# Patient Record
Sex: Female | Born: 1940 | Race: White | Hispanic: No | Smoking: Former smoker
Health system: Southern US, Community
[De-identification: ages and names within clinical notes are randomized; demographics above are authoritative.]

## PROBLEM LIST (undated history)

## (undated) DIAGNOSIS — R0602 Shortness of breath: Secondary | ICD-10-CM

## (undated) DIAGNOSIS — I8390 Asymptomatic varicose veins of unspecified lower extremity: Secondary | ICD-10-CM

## (undated) DIAGNOSIS — E7800 Pure hypercholesterolemia, unspecified: Secondary | ICD-10-CM

## (undated) DIAGNOSIS — IMO0001 Reserved for inherently not codable concepts without codable children: Secondary | ICD-10-CM

## (undated) DIAGNOSIS — I639 Cerebral infarction, unspecified: Secondary | ICD-10-CM

## (undated) DIAGNOSIS — I209 Angina pectoris, unspecified: Secondary | ICD-10-CM

## (undated) DIAGNOSIS — Z8719 Personal history of other diseases of the digestive system: Secondary | ICD-10-CM

## (undated) DIAGNOSIS — I8000 Phlebitis and thrombophlebitis of superficial vessels of unspecified lower extremity: Secondary | ICD-10-CM

## (undated) DIAGNOSIS — E782 Mixed hyperlipidemia: Secondary | ICD-10-CM

## (undated) DIAGNOSIS — G252 Other specified forms of tremor: Secondary | ICD-10-CM

## (undated) DIAGNOSIS — C50919 Malignant neoplasm of unspecified site of unspecified female breast: Secondary | ICD-10-CM

## (undated) DIAGNOSIS — I10 Essential (primary) hypertension: Secondary | ICD-10-CM

## (undated) DIAGNOSIS — M1990 Unspecified osteoarthritis, unspecified site: Secondary | ICD-10-CM

## (undated) DIAGNOSIS — I5031 Acute diastolic (congestive) heart failure: Secondary | ICD-10-CM

## (undated) DIAGNOSIS — I509 Heart failure, unspecified: Secondary | ICD-10-CM

## (undated) DIAGNOSIS — B029 Zoster without complications: Secondary | ICD-10-CM

## (undated) DIAGNOSIS — G250 Essential tremor: Secondary | ICD-10-CM

## (undated) DIAGNOSIS — T8859XA Other complications of anesthesia, initial encounter: Secondary | ICD-10-CM

## (undated) DIAGNOSIS — E041 Nontoxic single thyroid nodule: Secondary | ICD-10-CM

## (undated) DIAGNOSIS — I38 Endocarditis, valve unspecified: Secondary | ICD-10-CM

## (undated) DIAGNOSIS — I4891 Unspecified atrial fibrillation: Secondary | ICD-10-CM

## (undated) DIAGNOSIS — J441 Chronic obstructive pulmonary disease with (acute) exacerbation: Secondary | ICD-10-CM

## (undated) DIAGNOSIS — Z8489 Family history of other specified conditions: Secondary | ICD-10-CM

## (undated) DIAGNOSIS — I482 Chronic atrial fibrillation, unspecified: Secondary | ICD-10-CM

## (undated) DIAGNOSIS — K219 Gastro-esophageal reflux disease without esophagitis: Secondary | ICD-10-CM

## (undated) DIAGNOSIS — T4145XA Adverse effect of unspecified anesthetic, initial encounter: Secondary | ICD-10-CM

## (undated) DIAGNOSIS — I499 Cardiac arrhythmia, unspecified: Secondary | ICD-10-CM

## (undated) DIAGNOSIS — R011 Cardiac murmur, unspecified: Secondary | ICD-10-CM

## (undated) DIAGNOSIS — Z7901 Long term (current) use of anticoagulants: Secondary | ICD-10-CM

## (undated) DIAGNOSIS — I5032 Chronic diastolic (congestive) heart failure: Secondary | ICD-10-CM

## (undated) DIAGNOSIS — J9601 Acute respiratory failure with hypoxia: Secondary | ICD-10-CM

## (undated) DIAGNOSIS — H9190 Unspecified hearing loss, unspecified ear: Secondary | ICD-10-CM

## (undated) DIAGNOSIS — R079 Chest pain, unspecified: Secondary | ICD-10-CM

## (undated) DIAGNOSIS — J449 Chronic obstructive pulmonary disease, unspecified: Secondary | ICD-10-CM

## (undated) DIAGNOSIS — I119 Hypertensive heart disease without heart failure: Secondary | ICD-10-CM

## (undated) DIAGNOSIS — Z86010 Personal history of colonic polyps: Secondary | ICD-10-CM

## (undated) DIAGNOSIS — N179 Acute kidney failure, unspecified: Secondary | ICD-10-CM

## (undated) DIAGNOSIS — Z9221 Personal history of antineoplastic chemotherapy: Secondary | ICD-10-CM

## (undated) DIAGNOSIS — E890 Postprocedural hypothyroidism: Secondary | ICD-10-CM

## (undated) DIAGNOSIS — I6350 Cerebral infarction due to unspecified occlusion or stenosis of unspecified cerebral artery: Secondary | ICD-10-CM

## (undated) DIAGNOSIS — I781 Nevus, non-neoplastic: Secondary | ICD-10-CM

## (undated) DIAGNOSIS — E785 Hyperlipidemia, unspecified: Secondary | ICD-10-CM

## (undated) DIAGNOSIS — Z8673 Personal history of transient ischemic attack (TIA), and cerebral infarction without residual deficits: Secondary | ICD-10-CM

## (undated) HISTORY — DX: Cerebral infarction, unspecified: I63.9

## (undated) HISTORY — DX: Personal history of transient ischemic attack (TIA), and cerebral infarction without residual deficits: Z86.73

## (undated) HISTORY — PX: BREAST SURGERY: SHX581

## (undated) HISTORY — DX: Acute kidney failure, unspecified: N17.9

## (undated) HISTORY — PX: OTHER SURGICAL HISTORY: SHX169

## (undated) HISTORY — DX: Hypertensive heart disease without heart failure: I11.9

## (undated) HISTORY — DX: Mixed hyperlipidemia: E78.2

## (undated) HISTORY — DX: Pure hypercholesterolemia, unspecified: E78.00

## (undated) HISTORY — DX: Phlebitis and thrombophlebitis of superficial vessels of unspecified lower extremity: I80.00

## (undated) HISTORY — DX: Shortness of breath: R06.02

## (undated) HISTORY — DX: Chronic diastolic (congestive) heart failure: I50.32

## (undated) HISTORY — DX: Postprocedural hypothyroidism: E89.0

## (undated) HISTORY — DX: Acute diastolic (congestive) heart failure: I50.31

## (undated) HISTORY — DX: Malignant neoplasm of unspecified site of unspecified female breast: C50.919

## (undated) HISTORY — DX: Cerebral infarction due to unspecified occlusion or stenosis of unspecified cerebral artery: I63.50

## (undated) HISTORY — DX: Chest pain, unspecified: R07.9

## (undated) HISTORY — DX: Hyperlipidemia, unspecified: E78.5

## (undated) HISTORY — DX: Long term (current) use of anticoagulants: Z79.01

## (undated) HISTORY — DX: Heart failure, unspecified: I50.9

## (undated) HISTORY — PX: TONSILLECTOMY: SUR1361

## (undated) HISTORY — DX: Other specified forms of tremor: G25.2

## (undated) HISTORY — DX: Personal history of colonic polyps: Z86.010

## (undated) HISTORY — DX: Chronic obstructive pulmonary disease with (acute) exacerbation: J44.1

## (undated) HISTORY — DX: Essential tremor: G25.0

## (undated) HISTORY — DX: Chronic atrial fibrillation, unspecified: I48.20

## (undated) HISTORY — DX: Essential (primary) hypertension: I10

## (undated) HISTORY — DX: Acute respiratory failure with hypoxia: J96.01

## (undated) HISTORY — DX: Morbid (severe) obesity due to excess calories: E66.01

---
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
## 1988-11-29 HISTORY — PX: BUNIONECTOMY: SHX129

## 1993-08-29 HISTORY — PX: OTHER SURGICAL HISTORY: SHX169

## 1997-09-15 ENCOUNTER — Ambulatory Visit (HOSPITAL_COMMUNITY): Admission: RE | Admit: 1997-09-15 | Discharge: 1997-09-15 | Payer: Self-pay | Admitting: Family Medicine

## 1998-08-24 ENCOUNTER — Ambulatory Visit (HOSPITAL_COMMUNITY): Admission: RE | Admit: 1998-08-24 | Discharge: 1998-08-24 | Payer: Self-pay | Admitting: *Deleted

## 1998-09-12 ENCOUNTER — Encounter: Payer: Self-pay | Admitting: Family Medicine

## 1998-09-12 ENCOUNTER — Ambulatory Visit (HOSPITAL_COMMUNITY): Admission: RE | Admit: 1998-09-12 | Discharge: 1998-09-12 | Payer: Self-pay

## 1998-09-14 ENCOUNTER — Ambulatory Visit (HOSPITAL_COMMUNITY): Admission: RE | Admit: 1998-09-14 | Discharge: 1998-09-14 | Payer: Self-pay | Admitting: Family Medicine

## 1998-09-24 ENCOUNTER — Encounter: Payer: Self-pay | Admitting: Surgery

## 1998-09-25 ENCOUNTER — Encounter: Payer: Self-pay | Admitting: Surgery

## 1998-09-25 ENCOUNTER — Ambulatory Visit (HOSPITAL_COMMUNITY): Admission: RE | Admit: 1998-09-25 | Discharge: 1998-09-25 | Payer: Self-pay | Admitting: Surgery

## 1998-10-19 ENCOUNTER — Observation Stay (HOSPITAL_COMMUNITY): Admission: RE | Admit: 1998-10-19 | Discharge: 1998-10-20 | Payer: Self-pay | Admitting: Surgery

## 1998-10-19 ENCOUNTER — Encounter (INDEPENDENT_AMBULATORY_CARE_PROVIDER_SITE_OTHER): Payer: Self-pay

## 1998-10-19 HISTORY — PX: MASTECTOMY: SHX3

## 1998-11-06 ENCOUNTER — Ambulatory Visit (HOSPITAL_COMMUNITY): Admission: RE | Admit: 1998-11-06 | Discharge: 1998-11-06 | Payer: Self-pay | Admitting: *Deleted

## 1998-11-06 ENCOUNTER — Encounter: Payer: Self-pay | Admitting: *Deleted

## 1998-11-08 ENCOUNTER — Ambulatory Visit (HOSPITAL_COMMUNITY): Admission: RE | Admit: 1998-11-08 | Discharge: 1998-11-08 | Payer: Self-pay | Admitting: *Deleted

## 1998-11-08 ENCOUNTER — Encounter: Payer: Self-pay | Admitting: *Deleted

## 1998-12-04 ENCOUNTER — Ambulatory Visit (HOSPITAL_BASED_OUTPATIENT_CLINIC_OR_DEPARTMENT_OTHER): Admission: RE | Admit: 1998-12-04 | Discharge: 1998-12-04 | Payer: Self-pay | Admitting: Surgery

## 1998-12-04 ENCOUNTER — Encounter: Payer: Self-pay | Admitting: Surgery

## 1998-12-28 ENCOUNTER — Ambulatory Visit (HOSPITAL_COMMUNITY): Admission: RE | Admit: 1998-12-28 | Discharge: 1998-12-28 | Payer: Self-pay | Admitting: Surgery

## 1999-02-15 ENCOUNTER — Ambulatory Visit (HOSPITAL_COMMUNITY): Admission: RE | Admit: 1999-02-15 | Discharge: 1999-02-15 | Payer: Self-pay | Admitting: Surgery

## 1999-04-01 DIAGNOSIS — C50919 Malignant neoplasm of unspecified site of unspecified female breast: Secondary | ICD-10-CM

## 1999-04-01 HISTORY — DX: Malignant neoplasm of unspecified site of unspecified female breast: C50.919

## 1999-09-25 ENCOUNTER — Ambulatory Visit (HOSPITAL_COMMUNITY): Admission: RE | Admit: 1999-09-25 | Discharge: 1999-09-25 | Payer: Self-pay | Admitting: Surgery

## 1999-09-27 ENCOUNTER — Ambulatory Visit (HOSPITAL_COMMUNITY): Admission: RE | Admit: 1999-09-27 | Discharge: 1999-09-27 | Payer: Self-pay | Admitting: Obstetrics & Gynecology

## 2000-02-19 ENCOUNTER — Encounter: Payer: Self-pay | Admitting: *Deleted

## 2000-02-19 ENCOUNTER — Encounter: Admission: RE | Admit: 2000-02-19 | Discharge: 2000-02-19 | Payer: Self-pay | Admitting: *Deleted

## 2000-05-27 ENCOUNTER — Encounter (INDEPENDENT_AMBULATORY_CARE_PROVIDER_SITE_OTHER): Payer: Self-pay | Admitting: *Deleted

## 2000-05-27 ENCOUNTER — Ambulatory Visit (HOSPITAL_COMMUNITY): Admission: RE | Admit: 2000-05-27 | Discharge: 2000-05-27 | Payer: Self-pay | Admitting: *Deleted

## 2000-09-25 ENCOUNTER — Ambulatory Visit (HOSPITAL_COMMUNITY): Admission: RE | Admit: 2000-09-25 | Discharge: 2000-09-25 | Payer: Self-pay | Admitting: Obstetrics & Gynecology

## 2001-05-29 HISTORY — PX: JOINT REPLACEMENT: SHX530

## 2001-06-03 ENCOUNTER — Encounter: Payer: Self-pay | Admitting: Orthopedic Surgery

## 2001-06-07 ENCOUNTER — Inpatient Hospital Stay (HOSPITAL_COMMUNITY): Admission: RE | Admit: 2001-06-07 | Discharge: 2001-06-10 | Payer: Self-pay | Admitting: Orthopedic Surgery

## 2001-10-08 ENCOUNTER — Ambulatory Visit (HOSPITAL_COMMUNITY): Admission: RE | Admit: 2001-10-08 | Discharge: 2001-10-08 | Payer: Self-pay | Admitting: Obstetrics & Gynecology

## 2001-10-08 ENCOUNTER — Encounter (INDEPENDENT_AMBULATORY_CARE_PROVIDER_SITE_OTHER): Payer: Self-pay | Admitting: Specialist

## 2001-10-08 ENCOUNTER — Other Ambulatory Visit: Admission: RE | Admit: 2001-10-08 | Discharge: 2001-10-08 | Payer: Self-pay | Admitting: Obstetrics & Gynecology

## 2001-11-29 HISTORY — PX: JOINT REPLACEMENT: SHX530

## 2001-12-13 ENCOUNTER — Inpatient Hospital Stay (HOSPITAL_COMMUNITY): Admission: RE | Admit: 2001-12-13 | Discharge: 2001-12-16 | Payer: Self-pay | Admitting: Orthopedic Surgery

## 2002-10-14 ENCOUNTER — Encounter: Payer: Self-pay | Admitting: Oncology

## 2002-10-14 ENCOUNTER — Ambulatory Visit (HOSPITAL_COMMUNITY): Admission: RE | Admit: 2002-10-14 | Discharge: 2002-10-14 | Payer: Self-pay | Admitting: Oncology

## 2002-11-04 ENCOUNTER — Ambulatory Visit (HOSPITAL_COMMUNITY): Admission: RE | Admit: 2002-11-04 | Discharge: 2002-11-04 | Payer: Self-pay | Admitting: Obstetrics & Gynecology

## 2003-09-04 ENCOUNTER — Other Ambulatory Visit: Admission: RE | Admit: 2003-09-04 | Discharge: 2003-09-04 | Payer: Self-pay | Admitting: Obstetrics and Gynecology

## 2003-09-23 DIAGNOSIS — I38 Endocarditis, valve unspecified: Secondary | ICD-10-CM

## 2003-09-23 HISTORY — DX: Endocarditis, valve unspecified: I38

## 2003-10-16 ENCOUNTER — Encounter: Admission: RE | Admit: 2003-10-16 | Discharge: 2003-10-16 | Payer: Self-pay | Admitting: Oncology

## 2004-03-31 DIAGNOSIS — Z860101 Personal history of adenomatous and serrated colon polyps: Secondary | ICD-10-CM

## 2004-03-31 DIAGNOSIS — Z86010 Personal history of colonic polyps: Secondary | ICD-10-CM

## 2004-03-31 HISTORY — DX: Personal history of colonic polyps: Z86.010

## 2004-03-31 HISTORY — DX: Personal history of adenomatous and serrated colon polyps: Z86.0101

## 2004-05-29 ENCOUNTER — Ambulatory Visit: Payer: Self-pay | Admitting: Oncology

## 2004-07-26 ENCOUNTER — Encounter (INDEPENDENT_AMBULATORY_CARE_PROVIDER_SITE_OTHER): Payer: Self-pay | Admitting: Specialist

## 2004-07-26 ENCOUNTER — Ambulatory Visit (HOSPITAL_COMMUNITY): Admission: RE | Admit: 2004-07-26 | Discharge: 2004-07-26 | Payer: Self-pay | Admitting: *Deleted

## 2004-07-29 ENCOUNTER — Emergency Department (HOSPITAL_COMMUNITY): Admission: EM | Admit: 2004-07-29 | Discharge: 2004-07-29 | Payer: Self-pay | Admitting: Emergency Medicine

## 2004-10-16 ENCOUNTER — Encounter: Admission: RE | Admit: 2004-10-16 | Discharge: 2004-10-16 | Payer: Self-pay | Admitting: Oncology

## 2004-10-21 ENCOUNTER — Other Ambulatory Visit: Admission: RE | Admit: 2004-10-21 | Discharge: 2004-10-21 | Payer: Self-pay | Admitting: Obstetrics and Gynecology

## 2004-10-23 ENCOUNTER — Encounter: Admission: RE | Admit: 2004-10-23 | Discharge: 2004-10-23 | Payer: Self-pay | Admitting: Oncology

## 2004-11-26 ENCOUNTER — Ambulatory Visit: Payer: Self-pay | Admitting: Oncology

## 2005-01-16 ENCOUNTER — Ambulatory Visit: Payer: Self-pay | Admitting: Oncology

## 2005-05-28 ENCOUNTER — Ambulatory Visit: Payer: Self-pay | Admitting: Oncology

## 2005-10-20 ENCOUNTER — Encounter: Admission: RE | Admit: 2005-10-20 | Discharge: 2005-10-20 | Payer: Self-pay | Admitting: Oncology

## 2005-12-02 ENCOUNTER — Ambulatory Visit: Payer: Self-pay | Admitting: Oncology

## 2005-12-04 LAB — CBC WITH DIFFERENTIAL/PLATELET
Basophils Absolute: 0 10*3/uL (ref 0.0–0.1)
EOS%: 2.2 % (ref 0.0–7.0)
Eosinophils Absolute: 0.1 10*3/uL (ref 0.0–0.5)
HCT: 35.1 % (ref 34.8–46.6)
HGB: 12 g/dL (ref 11.6–15.9)
MCH: 28.3 pg (ref 26.0–34.0)
MCV: 82.9 fL (ref 81.0–101.0)
NEUT#: 2.8 10*3/uL (ref 1.5–6.5)
NEUT%: 66.1 % (ref 39.6–76.8)
lymph#: 0.9 10*3/uL (ref 0.9–3.3)

## 2005-12-04 LAB — COMPREHENSIVE METABOLIC PANEL
AST: 22 U/L (ref 0–37)
Albumin: 4.2 g/dL (ref 3.5–5.2)
BUN: 15 mg/dL (ref 6–23)
Calcium: 9.6 mg/dL (ref 8.4–10.5)
Chloride: 107 mEq/L (ref 96–112)
Creatinine, Ser: 0.62 mg/dL (ref 0.40–1.20)
Glucose, Bld: 111 mg/dL — ABNORMAL HIGH (ref 70–99)

## 2005-12-04 LAB — CANCER ANTIGEN 27.29: CA 27.29: 10 U/mL (ref 0–39)

## 2005-12-04 LAB — LACTATE DEHYDROGENASE: LDH: 209 U/L (ref 94–250)

## 2005-12-26 ENCOUNTER — Encounter: Admission: RE | Admit: 2005-12-26 | Discharge: 2005-12-26 | Payer: Self-pay | Admitting: Orthopedic Surgery

## 2006-06-10 ENCOUNTER — Ambulatory Visit: Payer: Self-pay | Admitting: Oncology

## 2006-06-15 LAB — COMPREHENSIVE METABOLIC PANEL
ALT: 30 U/L (ref 0–35)
Albumin: 4.4 g/dL (ref 3.5–5.2)
CO2: 25 mEq/L (ref 19–32)
Chloride: 105 mEq/L (ref 96–112)
Glucose, Bld: 111 mg/dL — ABNORMAL HIGH (ref 70–99)
Potassium: 4.1 mEq/L (ref 3.5–5.3)
Sodium: 140 mEq/L (ref 135–145)
Total Bilirubin: 1.7 mg/dL — ABNORMAL HIGH (ref 0.3–1.2)
Total Protein: 7 g/dL (ref 6.0–8.3)

## 2006-06-15 LAB — CBC WITH DIFFERENTIAL/PLATELET
BASO%: 0.4 % (ref 0.0–2.0)
Eosinophils Absolute: 0.1 10*3/uL (ref 0.0–0.5)
MCHC: 35.1 g/dL (ref 32.0–36.0)
MONO#: 0.4 10*3/uL (ref 0.1–0.9)
NEUT#: 3.1 10*3/uL (ref 1.5–6.5)
Platelets: 246 10*3/uL (ref 145–400)
RBC: 4.44 10*6/uL (ref 3.70–5.32)
RDW: 15 % — ABNORMAL HIGH (ref 11.3–14.5)
WBC: 4.5 10*3/uL (ref 3.9–10.0)
lymph#: 0.9 10*3/uL (ref 0.9–3.3)

## 2006-06-15 LAB — LACTATE DEHYDROGENASE: LDH: 201 U/L (ref 94–250)

## 2006-06-15 LAB — CEA: CEA: <0.5 ng/mL (ref 0.0–5.0)

## 2006-10-14 ENCOUNTER — Ambulatory Visit: Payer: Self-pay | Admitting: Oncology

## 2006-10-19 LAB — CANCER ANTIGEN 27.29: CA 27.29: 9 U/mL (ref 0–39)

## 2006-10-19 LAB — CBC WITH DIFFERENTIAL/PLATELET
BASO%: 0.3 % (ref 0.0–2.0)
Basophils Absolute: 0 10*3/uL (ref 0.0–0.1)
EOS%: 1.9 % (ref 0.0–7.0)
Eosinophils Absolute: 0.1 10*3/uL (ref 0.0–0.5)
HCT: 36.7 % (ref 34.8–46.6)
HGB: 12.9 g/dL (ref 11.6–15.9)
LYMPH%: 20.8 % (ref 14.0–48.0)
MCH: 28.8 pg (ref 26.0–34.0)
MCHC: 35.3 g/dL (ref 32.0–36.0)
MCV: 81.6 fL (ref 81.0–101.0)
MONO#: 0.4 10*3/uL (ref 0.1–0.9)
MONO%: 9.3 % (ref 0.0–13.0)
NEUT#: 3.2 10*3/uL (ref 1.5–6.5)
NEUT%: 67.7 % (ref 39.6–76.8)
Platelets: 255 10*3/uL (ref 145–400)
RBC: 4.5 10*6/uL (ref 3.70–5.32)
RDW: 15.3 % — ABNORMAL HIGH (ref 11.3–14.5)
WBC: 4.8 10*3/uL (ref 3.9–10.0)
lymph#: 1 10*3/uL (ref 0.9–3.3)

## 2006-10-19 LAB — COMPREHENSIVE METABOLIC PANEL
ALT: 25 U/L (ref 0–35)
AST: 28 U/L (ref 0–37)
Alkaline Phosphatase: 93 U/L (ref 39–117)
BUN: 19 mg/dL (ref 6–23)
Chloride: 107 mEq/L (ref 96–112)
Creatinine, Ser: 0.67 mg/dL (ref 0.40–1.20)

## 2006-10-19 LAB — LACTATE DEHYDROGENASE: LDH: 213 U/L (ref 94–250)

## 2006-10-22 ENCOUNTER — Encounter: Admission: RE | Admit: 2006-10-22 | Discharge: 2006-10-22 | Payer: Self-pay | Admitting: Oncology

## 2006-10-23 ENCOUNTER — Other Ambulatory Visit: Admission: RE | Admit: 2006-10-23 | Discharge: 2006-10-23 | Payer: Self-pay | Admitting: Obstetrics and Gynecology

## 2007-04-01 HISTORY — PX: KNEE SURGERY: SHX244

## 2007-04-16 ENCOUNTER — Ambulatory Visit: Payer: Self-pay | Admitting: Oncology

## 2007-04-26 LAB — CBC WITH DIFFERENTIAL/PLATELET
Basophils Absolute: 0 10*3/uL (ref 0.0–0.1)
Eosinophils Absolute: 0.1 10*3/uL (ref 0.0–0.5)
HCT: 35.7 % (ref 34.8–46.6)
HGB: 12.3 g/dL (ref 11.6–15.9)
LYMPH%: 21.2 % (ref 14.0–48.0)
MCV: 82.7 fL (ref 81.0–101.0)
MONO#: 0.5 10*3/uL (ref 0.1–0.9)
MONO%: 10.5 % (ref 0.0–13.0)
NEUT#: 3 10*3/uL (ref 1.5–6.5)
NEUT%: 65.2 % (ref 39.6–76.8)
Platelets: 243 10*3/uL (ref 145–400)
WBC: 4.6 10*3/uL (ref 3.9–10.0)

## 2007-04-26 LAB — COMPREHENSIVE METABOLIC PANEL
BUN: 15 mg/dL (ref 6–23)
CO2: 26 mEq/L (ref 19–32)
Creatinine, Ser: 0.66 mg/dL (ref 0.40–1.20)
Glucose, Bld: 100 mg/dL — ABNORMAL HIGH (ref 70–99)
Sodium: 139 mEq/L (ref 135–145)
Total Bilirubin: 1.6 mg/dL — ABNORMAL HIGH (ref 0.3–1.2)
Total Protein: 7 g/dL (ref 6.0–8.3)

## 2007-04-26 LAB — CANCER ANTIGEN 27.29: CA 27.29: 15 U/mL (ref 0–39)

## 2007-04-29 LAB — VITAMIN D PNL(25-HYDRXY+1,25-DIHY)-BLD
Vit D, 1,25-Dihydroxy: 35 pg/mL (ref 6–62)
Vit D, 25-Hydroxy: 48 ng/mL (ref 30–89)

## 2007-10-24 ENCOUNTER — Ambulatory Visit: Payer: Self-pay | Admitting: Oncology

## 2007-11-01 ENCOUNTER — Encounter: Admission: RE | Admit: 2007-11-01 | Discharge: 2007-11-01 | Payer: Self-pay | Admitting: Oncology

## 2007-11-01 LAB — CBC WITH DIFFERENTIAL/PLATELET
EOS%: 2.6 % (ref 0.0–7.0)
MCH: 28.9 pg (ref 26.0–34.0)
MCHC: 34.5 g/dL (ref 32.0–36.0)
MCV: 83.9 fL (ref 81.0–101.0)
MONO%: 9.5 % (ref 0.0–13.0)
NEUT#: 3.3 10*3/uL (ref 1.5–6.5)
RBC: 4.59 10*6/uL (ref 3.70–5.32)
RDW: 15.8 % — ABNORMAL HIGH (ref 11.3–14.5)

## 2007-11-02 LAB — COMPREHENSIVE METABOLIC PANEL
AST: 28 U/L (ref 0–37)
Albumin: 4.6 g/dL (ref 3.5–5.2)
Alkaline Phosphatase: 88 U/L (ref 39–117)
Potassium: 4.4 mEq/L (ref 3.5–5.3)
Sodium: 141 mEq/L (ref 135–145)
Total Protein: 7.4 g/dL (ref 6.0–8.3)

## 2007-11-02 LAB — VITAMIN D 25 HYDROXY (VIT D DEFICIENCY, FRACTURES): Vit D, 25-Hydroxy: 48 ng/mL (ref 30–89)

## 2007-11-10 ENCOUNTER — Encounter: Admission: RE | Admit: 2007-11-10 | Discharge: 2007-11-10 | Payer: Self-pay | Admitting: Oncology

## 2008-01-30 HISTORY — PX: JOINT REPLACEMENT: SHX530

## 2008-02-09 ENCOUNTER — Inpatient Hospital Stay (HOSPITAL_COMMUNITY): Admission: RE | Admit: 2008-02-09 | Discharge: 2008-02-13 | Payer: Self-pay | Admitting: Orthopedic Surgery

## 2008-11-01 ENCOUNTER — Encounter: Admission: RE | Admit: 2008-11-01 | Discharge: 2008-11-01 | Payer: Self-pay | Admitting: Oncology

## 2008-11-13 ENCOUNTER — Ambulatory Visit: Payer: Self-pay | Admitting: Oncology

## 2008-11-15 LAB — CBC WITH DIFFERENTIAL/PLATELET
Eosinophils Absolute: 0.1 10*3/uL (ref 0.0–0.5)
HCT: 32.7 % — ABNORMAL LOW (ref 34.8–46.6)
HGB: 11.2 g/dL — ABNORMAL LOW (ref 11.6–15.9)
LYMPH%: 19.7 % (ref 14.0–49.7)
MCH: 29.4 pg (ref 25.1–34.0)
MONO#: 0.3 10*3/uL (ref 0.1–0.9)
NEUT#: 2 10*3/uL (ref 1.5–6.5)
Platelets: 196 10*3/uL (ref 145–400)
RBC: 3.82 10*6/uL (ref 3.70–5.45)
WBC: 2.9 10*3/uL — ABNORMAL LOW (ref 3.9–10.3)

## 2008-11-16 LAB — COMPREHENSIVE METABOLIC PANEL
Albumin: 4.2 g/dL (ref 3.5–5.2)
CO2: 23 mEq/L (ref 19–32)
Glucose, Bld: 109 mg/dL — ABNORMAL HIGH (ref 70–99)
Sodium: 141 mEq/L (ref 135–145)
Total Bilirubin: 0.9 mg/dL (ref 0.3–1.2)
Total Protein: 6.5 g/dL (ref 6.0–8.3)

## 2008-11-16 LAB — LACTATE DEHYDROGENASE: LDH: 210 U/L (ref 94–250)

## 2008-11-16 LAB — CANCER ANTIGEN 27.29: CA 27.29: 16 U/mL (ref 0–39)

## 2009-06-30 ENCOUNTER — Emergency Department (HOSPITAL_COMMUNITY): Admission: EM | Admit: 2009-06-30 | Discharge: 2009-06-30 | Payer: Self-pay | Admitting: Emergency Medicine

## 2009-11-02 ENCOUNTER — Encounter: Admission: RE | Admit: 2009-11-02 | Discharge: 2009-11-02 | Payer: Self-pay | Admitting: Oncology

## 2009-11-12 ENCOUNTER — Ambulatory Visit: Payer: Self-pay | Admitting: Oncology

## 2009-11-14 LAB — CBC WITH DIFFERENTIAL/PLATELET
BASO%: 0.4 % (ref 0.0–2.0)
Basophils Absolute: 0 10*3/uL (ref 0.0–0.1)
EOS%: 2.1 % (ref 0.0–7.0)
Eosinophils Absolute: 0.1 10*3/uL (ref 0.0–0.5)
HCT: 35.6 % (ref 34.8–46.6)
HGB: 12.3 g/dL (ref 11.6–15.9)
LYMPH%: 21.5 % (ref 14.0–49.7)
MCV: 86.6 fL (ref 79.5–101.0)
MONO#: 0.3 10*3/uL (ref 0.1–0.9)
NEUT#: 2.6 10*3/uL (ref 1.5–6.5)
NEUT%: 67.1 % (ref 38.4–76.8)
RDW: 14.3 % (ref 11.2–14.5)
WBC: 3.9 10*3/uL (ref 3.9–10.3)
lymph#: 0.8 10*3/uL — ABNORMAL LOW (ref 0.9–3.3)

## 2009-11-15 LAB — COMPREHENSIVE METABOLIC PANEL
Alkaline Phosphatase: 40 U/L (ref 39–117)
BUN: 22 mg/dL (ref 6–23)
CO2: 26 mEq/L (ref 19–32)
Calcium: 9.8 mg/dL (ref 8.4–10.5)
Chloride: 104 mEq/L (ref 96–112)
Potassium: 4.4 mEq/L (ref 3.5–5.3)
Sodium: 140 mEq/L (ref 135–145)
Total Bilirubin: 0.9 mg/dL (ref 0.3–1.2)

## 2010-04-20 ENCOUNTER — Other Ambulatory Visit: Payer: Self-pay | Admitting: Oncology

## 2010-04-20 DIAGNOSIS — Z9012 Acquired absence of left breast and nipple: Secondary | ICD-10-CM

## 2010-06-19 LAB — POCT I-STAT, CHEM 8
BUN: 20 mg/dL (ref 6–23)
Calcium, Ion: 1.32 mmol/L (ref 1.12–1.32)
Chloride: 104 mEq/L (ref 96–112)
HCT: 39 % (ref 36.0–46.0)
Hemoglobin: 13.3 g/dL (ref 12.0–15.0)
Potassium: 4.4 mEq/L (ref 3.5–5.1)
Sodium: 141 mEq/L (ref 135–145)

## 2010-06-19 LAB — CK TOTAL AND CKMB (NOT AT ARMC)
CK, MB: 2.2 ng/mL (ref 0.3–4.0)
Total CK: 103 U/L (ref 7–177)

## 2010-06-19 LAB — POCT CARDIAC MARKERS: Troponin i, poc: <0.05 ng/mL (ref 0.00–0.09)

## 2010-06-19 LAB — DIFFERENTIAL
Basophils Absolute: 0 10*3/uL (ref 0.0–0.1)
Eosinophils Relative: 2 % (ref 0–5)
Lymphocytes Relative: 16 % (ref 12–46)
Lymphs Abs: 0.8 10*3/uL (ref 0.7–4.0)
Monocytes Absolute: 0.5 10*3/uL (ref 0.1–1.0)
Monocytes Relative: 10 % (ref 3–12)
Neutrophils Relative %: 71 % (ref 43–77)

## 2010-06-19 LAB — CBC
Hemoglobin: 12.8 g/dL (ref 12.0–15.0)
MCHC: 34.7 g/dL (ref 30.0–36.0)
Platelets: 241 10*3/uL (ref 150–400)

## 2010-07-30 HISTORY — PX: CARDIAC CATHETERIZATION: SHX172

## 2010-08-13 NOTE — H&P (Signed)
Kristin Hood, TEP              ACCOUNT NO.:  [id number]   MEDICAL RECORD NO.:  [id number]          PATIENT TYPE:  INP   LOCATION:  NA                           FACILITY:  [facility]   PHYSICIAN:  Ollen Gross, M.D.    DATE OF BIRTH:  Aug 19, 1940   DATE OF ADMISSION:  DATE OF DISCHARGE:                              HISTORY & PHYSICAL   DATE OF OFFICE VISIT:  History and physical was performed on January 11, 2008.   DATE OF ADMISSION:  Will be February 09, 2008.   CHIEF COMPLAINT:  Loose right total knee.   HISTORY OF PRESENT ILLNESS:  The patient is a 70 year old female who has  been seen by Dr. Lequita Halt for ongoing knee problems as second opinion.  She had a right total knee done back about 6 or 7 years ago.  She did  well until a fall about 2 to 2-1/2 years ago.  She started having some  pretibial pain ongoing.  She is patient of Dr. Jonetta Osgood and is good  friends with his PA, Kirstin.  [name] has been in contact with  Kirstin recently about worsening pain.  She followed up with notable x-  rays positive for loosening right tibial component.  She is referred  over for second opinion for possible revision surgery.  She is seen and  it is felt she would benefit from undergoing revision total knee  arthroplasty.  Risks and benefits have been discussed.  She would like  to proceed with surgery.   ALLERGIES:  NO KNOWN DRUG ALLERGIES.   CURRENT MEDICATIONS:  Crestor, __________ , propranolol, lisinopril,  Lovaza, aspirin, multivitamin, calcium plus D with zinc and iron.   PAST MEDICAL HISTORY:  1. Cataracts.  2. Hypertension.  3. 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  4. Breast cancer.   PAST SURGICAL HISTORY:  1. Bunionectomy bilaterally.  2. Removal of a tibial sesamoid.  3. Left bunion surgery.  4. Lumpectomy with node resection.  5. Mastectomy with additional underlying lymph node resection.  6. Port-A-Cath placement.  7. Right total knee replacement.  8.  Left total knee replacement.   FAMILY HISTORY:  Father with history of MI.  Mother with history of  aortic stenosis, pneumonia.   SOCIAL HISTORY:  Married, past smoker, quit 1993.  Occasional glass of  wine.  Two children.  Husband will be assisting with care after surgery.  She does have a one-story home.  She also has a living will and health  care power-of-attorney.   REVIEW OF SYSTEMS:  GENERAL:  No fevers, chills, occasional night  sweats.  NEURO:  No seizures, syncope, paralysis.  RESPIRATORY:  No  shortness breath, productive cough or hemoptysis.  CARDIOVASCULAR: No  chest pain or orthopnea.  She does have a cardiac murmur due to the  aortic valvular sclerosis.  GI:  No nausea, vomiting, diarrhea or  constipation.  GU:  No dysuria, hematuria.  MUSCULOSKELETAL:  Right  knee.   PHYSICAL EXAMINATION:  VITAL SIGNS: Pulse 72, respirations 14, blood  pressure 120/72.  GENERAL:  70 year old white female well-nourished, well-developed,  overweight.  Alert, oriented and cooperative, pleasant.  Excellent  historian.  HEENT: Normocephalic, atraumatic.  Pupils are round and reactive.  EOMs  intact.  Oropharynx clear.  NECK:  Is supple.  CHEST: Clear.  She has a left mastectomy.  HEART:  Faint early systolic ejection murmur, S1, S2 noted.  ABDOMEN:  Large, soft, protuberant.  Active bowel sounds present.  RECTAL/ BREASTS/ GENITALIA?  Not done.  LOWER EXTREMITIES:  Right knee tender proximal tibia, no effusion.  Range of motion 5-115.  No instability.   IMPRESSION:  Loose right total knee arthroplasty.   PLAN:  She will be admitted to the [facility], undergo  revision of the right total knee arthroplasty.  Surgery will be  performed by Ollen Gross.      Alexzandrew L. Perkins, P.A.C.      Ollen Gross, M.D.  Electronically Signed    ALP/MEDQ  D:  02/01/2008  T:  02/01/2008  Job:  [id number]   cc:   Ollen Gross, M.D.  Fax: [phone]   Anna Genre Little, M.D.   Fax: [phone]   Francisca December, M.D.  Fax: [phone]   Pierce Crane, MD  Fax: [phone]

## 2010-08-13 NOTE — Op Note (Signed)
Kristin Hood, Kristin Hood              ACCOUNT NO.:  [id number]   MEDICAL RECORD NO.:  [id number]          PATIENT TYPE:  INP   LOCATION:  1620                         FACILITY:  [facility]   PHYSICIAN:  Ollen Gross, M.D.    DATE OF BIRTH:  [birth year]   DATE OF PROCEDURE:  02/09/2008  DATE OF DISCHARGE:                               OPERATIVE REPORT   PREOPERATIVE DIAGNOSIS:  Loose right total knee arthroplasty.   POSTOPERATIVE DIAGNOSIS:  Loose right total knee arthroplasty.   PROCEDURE:  Right total knee arthroplasty revision.   SURGEON:  Ollen Gross, M.D.   ASSISTANT:  Alexzandrew L. Perkins, P.A.C.   ANESTHESIA:  General with postop Marcaine pain pump.   ESTIMATED BLOOD LOSS:  Minimal.   DRAIN:  Hemovac times one.   TOURNIQUET TIME:  Up 48 minutes at 300 mmHg, down 8 minutes and then up  an additional 27 minutes at 300 mmHg.   COMPLICATIONS:  None.   CONDITION:  Stable to recovery room.   BRIEF CLINICAL NOTE:  Kristin Hood is a 70 year old female who had a  right total knee arthroplasty done several years ago.  She did well and  then had a fall and had progressively worsening pretibial pain since.  Her radiographs reveal progressive loosening and varus tilt of the  tibial compliment.  She presents now for revision of the total knee.   PROCEDURE IN DETAIL:  After the successful administration of general  anesthetic the tourniquet was placed high on the right thigh and right  lower extremity prepped and draped in the usual sterile fashion.  Extremity was wrapped in Esmarch, knee flexed, tourniquet inflated to  300 mmHg.  Midline incision made with a 10 blade through the  subcutaneous tissue to the level of the extensor mechanism.  A fresh  blade is used to make a medial parapatellar arthrotomy.  There is  minimal synovial fluid encountered.  This was sent for a stat Gram stain  which was negative.  Soft tissue over the proximal medial tibia is  subperiosteally elevated  to the joint line with the knife and into the  semimembranosus bursa with a Cobb elevator.  Soft tissue laterally is  elevated with attention being paid to avoid the patellar tendon on  tibial tubercle.  Patella was subluxed laterally and knee flexed 90  degrees.  I removed the tibial polyethylene from the tibial tray easily  with a quarter inch osteotome.  The femoral component is then removed by  disrupting interface between the femur and the component using  osteotomes.  The components removed with minimal if any bone loss.  I  then subluxed the tibia forward and basically was able to pick out the  tibial tray with my fingers.  It had collapsed into varus and was  grossly loose.  I then removed the cement from the keel and the cement  from the canal.  I then thoroughly irrigated the femoral and tibial  canals and reamed the femoral canal up to 18 mm which had a good tight  fit and on the tibial side I went to 13 mm.  The tibia was again subluxed forward and retractors placed.  The  extramedullary cutting guide is placed and referencing proximally to the  medial aspect of the tibial tubercle and distally along the second  metatarsal axis and tibial crest.  I cut the tibia so it would go to the  bottom of that medial defect.  I went down this level because I knew I  would be building up again with augments.  Resection is made with an  oscillating saw.  Size 2.5 is the most appropriate tibial component.  We  prepared the proximal tibia with the proximal reamer with a 2.5.  I then  did the punch for the 29 mm sleeve to gain rotational control.  We had  excellent fit with the component and sleeve.  There was a 13 x 30 stem  extension which was placed on the tibial MBT tray.   The femur was then prepared.  We thoroughly irrigated the canal again  and placed the 18 mm reamer.  A 5 degree right valgus alignment guide is  placed and for the zero position I did not contact any bone so I went  up  to the +4 position and thus we put 4 mm distal medial and lateral  augments.  The size is a 3 based on the femoral trials.  The size 3  cutting block was placed in a +2 position to effectively raise the stem  and lower the component to the anterior flange of the femur.  I then  placed a 15 mm spacer block in 90 degrees of flexion and this led to a  rectangular flexion gap at 90 degrees.  The rotation is marked and then  anterior, posterior and chamfer cuts are made.   The revision block is placed for the intercondylar cut.  Once this is  completed then a trial was assembled.  On the tibial side we trialed a  size 2.5 MBT revision tray with a 10 mm medial and lateral augmented 29  mm sleeve and 13 x 30 stem extension.  We had excellent fit with this.  On the femoral side our trial was a size 3 TC3 femur with an 18 x 75  stem extension in the +2 position, 4 mm medial and lateral distal  augments as well as a 4 mm posterior medial augment.  We had excellent  fit on the femur with this trial.  We then put the 15 mm insert in.  With the 15 she really hyper extends quite a bit and had fair tension in  flexion.  I decided we needed to build the distal femur down further so  I went to 8 mm distal augments medial and lateral.  We then tried with a  17.5 insert and there was great stability with full extension to varus  and valgus testing.  Then at 90 degrees of flexion there was great  stability, anterior posterior testing so I was very pleased with this  construct.  I then inspected the patella.  The patella button did not  show any significant wear but there was bone overgrowing.  I had to do a  patelloplasty clearing the bone and soft tissue off of the patellar  component.  The patella then tracked perfectly.  We then let the  tourniquet down for a total time of 48 minutes.  I kept it down for 8  minutes total.  This was to ensure that there was minimal bleeding.  After it was  down for 8  minutes then we assembled the components on the  back table.  The components once again were a size 2.5 MBT revision  tray, 10 mm medial and lateral augments with a 13 x 30 stem extension  and a 29 sleeve.  On the femoral side it was a size 3 TC3 femur in the  +2 position, 18 x 75 stem extension with 8 mm distal medial and lateral  augments and a 4 mm posterior medial augment.  I then sized for the  cement restrictor in the tibia and it was a 4.  It was placed at the  appropriate depth in the tibial canal.  The bone surfaces were then  thoroughly irrigated with pulsatile lavage.  The cement was mixed and  once ready for implantation it was injected into the tibial canal.  The  tibial component was impacted with great fit.  On the femoral side it  was cemented distally with a Press-Fit stem.  We did the cementing,  impacted the femur and then removed all the extruded cement.  A trial  17.5 insert was placed, knee held in full extension and all extruded  cement removed.  When the cement was fully hardened then a permanent  17.5 mm TC3 rotating platform tibial polyethylene was placed.  Once  again there is excellent stability with varus, valgus and anterior  posterior stressing.  The wound is copiously irrigated with saline  solution and the arthrotomy closed over a Hemovac drain with interrupted  #1 PDS.  Flexion against gravity was over 95 degrees.  Subcu was closed with interrupted 2-0 Vicryl and skin closed with  staples.  A bulky sterile dressing was applied.  Drains hooked to  suction and she was placed into a knee immobilizer, awakened and  transported to recovery in stable condition.      Ollen Gross, M.D.  Electronically Signed     FA/MEDQ  D:  02/09/2008  T:  02/09/2008  Job:  [id number]

## 2010-08-16 NOTE — Discharge Summary (Signed)
[name], [name]              ACCOUNT NO.:  [id number]   MEDICAL RECORD NO.:  [id number]          PATIENT TYPE:  INP   LOCATION:  1620                         FACILITY:  [facility]   PHYSICIAN:  Ollen Gross, M.D.    DATE OF BIRTH:  05-16-1940   DATE OF ADMISSION:  02/09/2008  DATE OF DISCHARGE:  02/13/2008                               DISCHARGE SUMMARY   ADMISSION DIAGNOSES:  1. Loose right total knee arthroplasty.  2. Cataracts.  3. Hypertension.  4. 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  5. Breast cancer.   DISCHARGE DIAGNOSES:  1. Loose right total knee arthroplasty status post right total knee      arthroplasty revision.  2. Acute postoperative blood loss anemia.  3. Status post transfusion without sequelae.  4. Mild postoperative hyponatremia, improved.  5. Cataracts.  6. Hypertension.  7. 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  8. Breast cancer.   PROCEDURE:  February 09, 2008:  Right total knee arthroplasty revision.   SURGEON:  Ollen Gross, M.D.   ASSISTANT:  Alexzandrew L. Perkins, P.A.C.   ANESTHESIA:  General.   CONSULTS:  None.   BRIEF HISTORY:  Kristin Hood is a 70 year old female with a right total  knee done several years ago.  Did well until a fall when she started  having progressive worsening pretibial pain.  Radiograph showed some  progressive loosening with some varus tilt of the tibial component, now  presented for a revision procedure.   LABORATORY DATA:  Blood group type A+.  CBC on admission:  Hemoglobin  13.0, hematocrit 38.6, white cell count 5.1, platelets 263.  Chem panel  on admission, mildly elevated SGOT of 40.  Remaining chem panel all  within normal limits.  PT/INR 13.1 and 1.0 with PTT of 41.  Preop UA,  moderate leukocyte esterase, otherwise negative.  Serial CBCs were  followed.  Hemoglobin did drop down to 9.9 then got as low as 8.1.  She  was given 2 units of blood. Post-transfusion hemoglobin back up to 8.5.  Serial BMETs were followed.  Sodium did drop down to 133, got as low as  131, came back up to 137.  Serial pro-times followed.  Last noted INR  was 2.7.   DIAGNOSTICS:  1. EKG January 13, 2008, normal sinus rhythm, normal EKG unconfirmed.  2. Chest x-ray two-view February 07, 2008, no acute cardiopulmonary      process.   HOSPITAL COURSE:  The patient admitted to [facility] and  tolerated the procedure well, later transferred to the recovery room on  the orthopedic floor.  Started on PCA and p.r.n. analgesia for pain  control following surgery.  Given 24 hours of postop IV antibiotics.  A  fair amount of pain on the evening of surgery, doing a little better on  the morning of day 1, started getting up out of bed.  Hemoglobin is down  to 9.9.  Started on iron.  Sodium was a little low.  Decreased fluids.  Creatinine bumped up a little bit from 47 to 1.4.  Was on ACE inhibitor,  that was held.  Blood pressure was a little low, so we gave fluids.  Started getting up out of bed on day 1.  By day 2, she was walking about  30-50 feet.  Unfortunately, the hemoglobin was down to 8.1.  Gave her 2  units of blood, tolerated the blood well.  Dressing was changed and  incision looked good.  Her output was excellent.  Sodium was down a  little bit from the dilutional component to 131.  Got her blood  rechecked in the lab.  Sodium did come back to 137.  Hemoglobin improved  a little bit up to 8.5, blood pressure had stabilized.  She was  hemodynamically stable.  Continued to progress with slow progress with  therapy.  Eventually, got up to walking greater than 70 feet.  By  February 13, 2008, the patient was doing well with her therapy,  tolerating her meds and wanted to go home.   DISPOSITION:  The patient was discharged home on February 13, 2008.   DISCHARGE MEDICATIONS:  1. Percocet.  2. Robaxin.  3. Nu-Iron.  4. Coumadin.   DIET:  Low-sodium, heart-healthy diet.   ACTIVITY:   Weightbearing as tolerated, total knee protocol.  Home health  PT and home health nursing.   FOLLOW UP:  Follow up in 2 weeks.   CONDITION ON DISCHARGE:  Improved.      Alexzandrew L. Perkins, P.A.C.      Ollen Gross, M.D.  Electronically Signed    ALP/MEDQ  D:  02/18/2008  T:  02/18/2008  Job:  [id number]   cc:   Ollen Gross, M.D.  Fax: [phone]   Anna Genre Little, M.D.  Fax: [phone]   Francisca December, M.D.  Fax: [phone]   Pierce Crane, MD  Fax: [phone]

## 2010-08-21 ENCOUNTER — Other Ambulatory Visit: Payer: Self-pay | Admitting: Dermatology

## 2010-11-04 ENCOUNTER — Ambulatory Visit
Admission: RE | Admit: 2010-11-04 | Discharge: 2010-11-04 | Disposition: A | Discharge status: Home / Self Care | Payer: Medicare Other | Source: Ambulatory Visit | Attending: Oncology | Admitting: Oncology

## 2010-11-04 ENCOUNTER — Other Ambulatory Visit: Payer: Self-pay | Admitting: Family Medicine

## 2010-11-04 DIAGNOSIS — Z9012 Acquired absence of left breast and nipple: Secondary | ICD-10-CM

## 2011-01-01 LAB — BASIC METABOLIC PANEL
BUN: 15
CO2: 24
CO2: 24
CO2: 29
Chloride: 102
Chloride: 103
Creatinine, Ser: 1.27 — ABNORMAL HIGH
GFR calc Af Amer: 43 — ABNORMAL LOW
GFR calc Af Amer: 51 — ABNORMAL LOW
Glucose, Bld: 109 — ABNORMAL HIGH
Glucose, Bld: 141 — ABNORMAL HIGH
Potassium: 4.3
Potassium: 4.4
Potassium: 4.4
Sodium: 133 — ABNORMAL LOW
Sodium: 137

## 2011-01-01 LAB — CBC
HCT: 22.9 — ABNORMAL LOW
HCT: 24.7 — ABNORMAL LOW
HCT: 28.7 — ABNORMAL LOW
HCT: 38.6
Hemoglobin: 13
Hemoglobin: 8.5 — ABNORMAL LOW
Hemoglobin: 9.9 — ABNORMAL LOW
MCHC: 34.4
MCHC: 34.6
MCHC: 34.8
MCV: 83.6
MCV: 84.8
Platelets: 149 — ABNORMAL LOW
RBC: 2.71 — ABNORMAL LOW
RBC: 3.35 — ABNORMAL LOW
RBC: 4.48
RDW: 15.3
RDW: 15.6 — ABNORMAL HIGH
RDW: 17.4 — ABNORMAL HIGH
WBC: 5

## 2011-01-01 LAB — URINALYSIS, ROUTINE W REFLEX MICROSCOPIC
Bilirubin Urine: NEGATIVE
Hgb urine dipstick: NEGATIVE
Nitrite: NEGATIVE
Protein, ur: 30 — AB
Specific Gravity, Urine: 1.017
Urobilinogen, UA: 1

## 2011-01-01 LAB — TYPE AND SCREEN

## 2011-01-01 LAB — URINE MICROSCOPIC-ADD ON

## 2011-01-01 LAB — PROTIME-INR
INR: 1
INR: 1.2
Prothrombin Time: 13.1
Prothrombin Time: 22.1 — ABNORMAL HIGH
Prothrombin Time: 30.5 — ABNORMAL HIGH

## 2011-01-01 LAB — GRAM STAIN

## 2011-01-01 LAB — COMPREHENSIVE METABOLIC PANEL
Alkaline Phosphatase: 66
BUN: 17
CO2: 28
Chloride: 107
GFR calc non Af Amer: >60
Glucose, Bld: 95
Potassium: 4.8
Total Bilirubin: 1.2
Total Protein: 7.1

## 2011-01-01 LAB — ANAEROBIC CULTURE

## 2011-04-01 DIAGNOSIS — I639 Cerebral infarction, unspecified: Secondary | ICD-10-CM

## 2011-04-01 HISTORY — DX: Cerebral infarction, unspecified: I63.9

## 2011-07-30 HISTORY — PX: CARDIAC CATHETERIZATION: SHX172

## 2011-08-11 ENCOUNTER — Encounter (HOSPITAL_COMMUNITY): Payer: Self-pay | Admitting: Respiratory Therapy

## 2011-08-11 ENCOUNTER — Other Ambulatory Visit: Payer: Self-pay | Admitting: Cardiology

## 2011-08-13 NOTE — H&P (Signed)
  Patient: Kristin Hood, Onell D Provider: Pesach Frisch, MD  DOB: 07/12/1940 Age: 71 Y Sex: Female Date: 08/11/2011  Phone: [phone]   Address: [address], [place], [id number]  Pcp: KEVIN LITTLE       Subjective:     CC:    1. STRESS TEST FOLLOW UP.        HPI:  General:  71-year-old with hypertension, hyperlipidemia, former Dr. Edmunds patient's with last echocardiogram on 01/27/08 demonstrating normal EF, aortic valve sclerosis, posterior mitral valve leaflet thickened and restricted, mild mitral regurgitation, mild left atrial enlargement.  No chest pain, but walking can cause dyspnea, moderate in severity, relieved with rest, no radiation of symptoms. Brought on with exertion. No associated angina. She had stress test which showed inferior ischemia, transient ischemic dilatation with normal ejection fraction. Does water exercises. Had 3 knee replacements. 25 min of stair steppers. She only went 2 minutes on the prior exercise treadmill test before significant shortness of breath ensued. Has essential tremor. Been building since she was 15.  .        ROS:  The other elements of the review of systems are negative.       Medical History: history of breast cancer 2001 followed by oncologist Dr. Rubin, Essential hypertension with mild concentric left ventricular hypertrophy, benign colon polyps on colonoscopy in 2006 with Dr.Santogade, history of adenomatous colon polyps, Mixed dyslipidemia, Essential tremor.        Family History:        Social History:  General:  History of smoking  cigarettes: Former smoker Quit in year 1993 Pack-year Hx: 30 no Smoking.  Alcohol: yes, occasionally-wine.  no Recreational drug use.  Marital Status: married.        Medications: Fish Oil Capsule 2 capsules BID, Aspirin 81 MG Tablet Chewable 1 tablet Once a day, Multivitamins Tablet 1 tablet daily, Iron 66 MG Tablet 1 tablet daily, Calcium + D Tablet 2 tablets Once a day,  Ketoconazole 2 % Cream 1 application to affected area Once a day, Amoxicillin 500 MG Capsule 1 capsule per dentist, Fenofibrate 160 MG Tablet TAKE 1 TABLET BY MOUTH DAILY , Crestor 10 MG Tablet TAKE 1 TABLET EVERY EVENING , Propranolol HCl 60 MG Tablet 2 tablets in the AM and 1 tablet in the PM daily, Lisinopril 5 MG Tablet 1 tablet Once a day, Medication List reviewed and reconciled with the patient       Allergies: Floxin Otic: nausea:dizziness.       Objective:     Vitals: Wt 271.2, Wt change .8 lb, Ht 68.5, BMI 40.63, Pulse sitting 76, BP sitting 138/92.       Examination:  General Examination:  GENERAL APPEARANCE alert, oriented, NAD, pleasant.  SKIN: normal, no rash.  HEENT: normal.  HEAD: [place]/AT.  EYES: EOMI, Conjunctiva clear.  NECK: supple, FROM, without evidence of thyromegaly, adenopathy, or bruits, no jugular venous distention (JVD).  LUNGS: clear to auscultation bilaterally, no wheezes, rhonchi, rales, regular breathing rate and effort.  HEART: regular rate and rhythm, no S3, S4, soft, difficult to hear murmur left lower sternal border systolic murmur, point of maximul impulse (PMI) difficult to palpate.  ABDOMEN: soft, non-tender/non-distended, bowel sounds present, no masses palpated, no bruit, Overweight.  EXTREMITIES: no clubbing, no edema, difficult to palpate distal pulses.  NEUROLOGIC EXAM: non-focal exam, alert and oriented x 3, essential tremor noted, waiver in voice as well..  PERIPHERAL PULSES: normal (2+) bilaterally.  LYMPH NODES: no cervical adenopathy.    PSYCH affect normal.  EKG reviewed, prior lab work reviewed, prior medical records reviewed, prior echocardiogram reviewed.       Assessment:     Assessment:  1. Abnormal cardiovascular stress test - 794.39 (Primary)  2. Mixed hyperlipidemia - 272.2  3. Essential hypertension, benign - 401.1  4. Tremor, essential - 781.0    Plan:     1. Abnormal cardiovascular stress test  LAB: PT (Prothrombin  Time) (005199) (Ordered for 08/11/2011)    Prothrombin Time 11.1 9.1-12.0 - SEC    INR 1.0 0.8-1.2 -     Jaelan Rasheed 08/13/2011 05:13:33 PM > ok for cath    LAB: Basic Metabolic (Ordered for 08/11/2011)    GLUCOSE 104  70-99 - mg/dL H   BUN 21  6-26 - mg/dL    CREATININE 0.88  0.60-1.30 - mg/dl    eGFR (NON-AFRICAN AMERICAN) 63 >60 - calc    eGFR (AFRICAN AMERICAN) 76 >60 - calc    SODIUM 139  136-145 - mmol/L    POTASSIUM 4.8  3.5-5.5 - mmol/L    CHLORIDE 106  98-107 - mmol/L    C02 29  22-32 - mg/dL    ANION GAP 8.4 6.0-20.0 - mmol/L    CALCIUM 10.2  8.6-10.3 - mg/dL     Espiridion Supinski 08/13/2011 05:13:33 PM > ok for cath    LAB: CBC with Diff (Ordered for 08/11/2011)    WBC 3.5 4.0-11.0 - K/ul L   RBC 4.30 4.20-5.40 - M/uL    HGB 12.4 12.0-16.0 - g/dL    HCT 37.7 37.0-47.0 - %    MCH 28.8 27.0-33.0 - pg    MPV 9.8 7.5-10.7 - fL    MCV 87.7 81.0-99.0 - fL    MCHC 32.8 32.0-36.0 - g/dL    RDW 14.4 11.5-15.5 - %    NRBC# 0.01 -    PLT 224 150-400 - K/uL    NEUT % 63.8 43.3-71.9 - %    NRBC% 0.30 - %    LYMPH% 20.2 16.8-43.5 - %    MONO % 11.9 4.6-12.4 - %    EOS % 3.5 0.0-7.8 - %    BASO % 0.6 0.0-1.0 - %    NEUT # 2.2 1.9-7.2 - K/uL    LYMPH# 0.70 1.10-2.70 - K/uL L   MONO # 0.4 0.3-0.8 - K/uL    EOS # 0.1 0.0-0.6 - K/uL    BASO # 0.0 0.0-0.1 - K/uL     Crystalynn Mcinerney 08/13/2011 05:13:33 PM > ok for cath    LAB: PT (Prothrombin Time) (005199) (Ordered for 08/11/2011)    Prothrombin Time 11.1 9.1-12.0 - SEC    INR 1.0 0.8-1.2 -     Carnell Casamento 08/13/2011 05:13:33 PM > ok for cath    LAB: CBC with Diff (Ordered for 08/11/2011)    WBC 3.5 4.0-11.0 - K/ul L   RBC 4.30 4.20-5.40 - M/uL    HGB 12.4 12.0-16.0 - g/dL    HCT 37.7 37.0-47.0 - %    MCH 28.8 27.0-33.0 - pg    MPV 9.8 7.5-10.7 - fL    MCV 87.7 81.0-99.0 - fL    MCHC 32.8 32.0-36.0 - g/dL    RDW 14.4 11.5-15.5 - %    NRBC# 0.01 -    PLT 224 150-400 - K/uL    NEUT % 63.8 43.3-71.9 - %    NRBC% 0.30 - %     LYMPH% 20.2 16.8-43.5 - %    MONO % 11.9 4.6-12.4 - %      EOS % 3.5 0.0-7.8 - %    BASO % 0.6 0.0-1.0 - %    NEUT # 2.2 1.9-7.2 - K/uL    LYMPH# 0.70 1.10-2.70 - K/uL L   MONO # 0.4 0.3-0.8 - K/uL    EOS # 0.1 0.0-0.6 - K/uL    BASO # 0.0 0.0-0.1 - K/uL     Geramy Lamorte 08/13/2011 05:13:33 PM > ok for cath    LAB: Basic Metabolic (Ordered for 08/11/2011)    GLUCOSE 104  70-99 - mg/dL H   BUN 21  6-26 - mg/dL    CREATININE 0.88  0.60-1.30 - mg/dl    eGFR (NON-AFRICAN AMERICAN) 63 >60 - calc    eGFR (AFRICAN AMERICAN) 76 >60 - calc    SODIUM 139  136-145 - mmol/L    POTASSIUM 4.8  3.5-5.5 - mmol/L    CHLORIDE 106  98-107 - mmol/L    C02 29  22-32 - mg/dL    ANION GAP 8.4 6.0-20.0 - mmol/L    CALCIUM 10.2  8.6-10.3 - mg/dL     Keylah Darwish 08/13/2011 05:13:33 PM > ok for cath    We discussed the abnormality on her stress test today with her husband. Her husband is a former patient of Dr. Edmunds. He had bypass surgery by Dr. Bartle. We will go ahead and perform radial access. Risks and benefits of cardiac catheterization have been reviewed including risk of stroke, heart attack, death, bleeding, renal impariment and arterial damage. There was ample oppurtuny to answer questions. Alternatives were discussed. Patient understands and wishes to proceed.        Immunizations:        Labs:        Procedure Codes: 36415 BLOOD COLLECTION ROUTINE VENIPUNCTURE, 80048 ECL BMP, 85025 ECL CBC PLATELET DIFF       Preventive:         Follow Up: post cath      Provider: Carle Fenech, MD  Patient: Kristin Hood, Aubriella D DOB: 03/24/1941 Date: 08/11/2011    

## 2011-08-14 ENCOUNTER — Ambulatory Visit (HOSPITAL_COMMUNITY)
Admission: RE | Admit: 2011-08-14 | Discharge: 2011-08-14 | Disposition: A | Discharge status: Home / Self Care | Payer: Medicare Other | Source: Ambulatory Visit | Attending: Cardiology | Admitting: Cardiology

## 2011-08-14 ENCOUNTER — Encounter (HOSPITAL_COMMUNITY)
Admission: RE | Disposition: A | Discharge status: Home / Self Care | Payer: Self-pay | Source: Ambulatory Visit | Attending: Cardiology

## 2011-08-14 DIAGNOSIS — R9439 Abnormal result of other cardiovascular function study: Secondary | ICD-10-CM | POA: Insufficient documentation

## 2011-08-14 DIAGNOSIS — I059 Rheumatic mitral valve disease, unspecified: Secondary | ICD-10-CM | POA: Insufficient documentation

## 2011-08-14 DIAGNOSIS — I359 Nonrheumatic aortic valve disorder, unspecified: Secondary | ICD-10-CM | POA: Insufficient documentation

## 2011-08-14 DIAGNOSIS — E785 Hyperlipidemia, unspecified: Secondary | ICD-10-CM | POA: Insufficient documentation

## 2011-08-14 DIAGNOSIS — I10 Essential (primary) hypertension: Secondary | ICD-10-CM | POA: Insufficient documentation

## 2011-08-14 HISTORY — PX: LEFT HEART CATHETERIZATION WITH CORONARY ANGIOGRAM: SHX5451

## 2011-08-14 SURGERY — LEFT HEART CATHETERIZATION WITH CORONARY ANGIOGRAM
Anesthesia: LOCAL

## 2011-08-14 MED ORDER — ASPIRIN 81 MG PO CHEW
324.0000 mg | CHEWABLE_TABLET | ORAL | Status: AC
  Administered 2011-08-14: 324 mg via ORAL
  Filled 2011-08-14: qty 4

## 2011-08-14 MED ORDER — SODIUM CHLORIDE 0.9 % IV SOLN: 250.0000 mL | INTRAVENOUS | Status: DC | PRN

## 2011-08-14 MED ORDER — DIAZEPAM 5 MG PO TABS
5.0000 mg | ORAL_TABLET | ORAL | Status: AC
  Administered 2011-08-14: 5 mg via ORAL
  Filled 2011-08-14: qty 1

## 2011-08-14 MED ORDER — SODIUM CHLORIDE 0.9 % IV SOLN
INTRAVENOUS | Status: DC
  Administered 2011-08-14: 08:00:00 via INTRAVENOUS

## 2011-08-14 MED ORDER — SODIUM CHLORIDE 0.9 % IJ SOLN: 3.0000 mL | Freq: Two times a day (BID) | INTRAMUSCULAR | Status: DC

## 2011-08-14 MED ORDER — SODIUM CHLORIDE 0.9 % IJ SOLN: 3.0000 mL | INTRAMUSCULAR | Status: DC | PRN

## 2011-08-14 NOTE — CV Procedure (Signed)
PROCEDURE:  Left heart catheterization with selective coronary angiography via the radial artery approach.  INDICATIONS:  71 year old with abnormal stress NUC (TID, decreased exercise tolerance)   The risks, benefits, and details of the procedure were explained to the patient, including possibilities of stroke, heart attack, death, renal impairment, arterial damage, bleeding.  The patient verbalized understanding and wanted to proceed.  Informed written consent was obtained.  PROCEDURE TECHNIQUE:  Allen's test was performed pre-and post procedure and was normal. The right radial artery site was prepped and draped in a sterile fashion. One percent lidocaine was used for local anesthesia. Using the modified Seldinger technique a 5 French hydrophilic sheath was inserted into the radial artery without difficulty. 3 mg of verapamil was administered via the sheath. A Judkins right #4 catheter with the guidance of a Versicore wire was placed in the right coronary cusp and selectively cannulated the right coronary artery. After traversing the aortic arch, 5000 units of heparin IV was administered. A Judkins left #3.5 catheter was used to selectively cannulate the left main artery. Multiple views with hand injection of Omnipaque were obtained. JR4 used to cross into the left ventricle, hemodynamics were obtained,no left ventriculogram was performed (EF 65% on ECHO). Following the procedure, sheath was removed, patient was hemodynamically stable, hemostasis was maintained with a Terumo T band.   CONTRAST:  Total of 50 ml.     COMPLICATIONS:  None.    HEMODYNAMICS:  Aortic pressure was 129/75mmHg; LV systolic pressure was 129 mmHg; LVEDP .  There was no gradient between the left ventricle and aorta.    ANGIOGRAPHIC DATA:    Left main: No angiographically significant CAD.  Left anterior descending (LAD): No angiographically significant CAD. Minor luminal irregularities.  Circumflex artery (CIRC): No  angiographically significant CAD. Minor luminal irregularities.  Right coronary artery (RCA): No angiographically significant CAD. Minor luminal irregularities. Dominant vessel.   There significant mitral annular calcification present.  LEFT VENTRICULOGRAM:  Not performed. Echocardiogram normal EF.  IMPRESSIONS:  No angiographically significant CAD Normal left ventricular systolic function.  LVEDP 14 mmHg.  Ejection fraction 65% by ECHO.   RECOMMENDATION:  Medical management. Improve conditioning. Discussed with family. Continue with hypertension control, weight loss.

## 2011-08-14 NOTE — H&P (View-Only) (Signed)
Patient: Kristin Hood, Auman Provider: Donato Schultz, MD  DOB: 1940-09-14 Age: 71 Y Sex: Female Date: 08/11/2011  Phone: [phone]   Address: [address], [place], [phone]  Pcp: KEVIN LITTLE       Subjective:     CC:    1. STRESS TEST FOLLOW UP.        HPI:  General:  71 year old with hypertension, hyperlipidemia, former Dr. Amil Amen patient's with last echocardiogram on 01/27/08 demonstrating normal EF, aortic valve sclerosis, posterior mitral valve leaflet thickened and restricted, mild mitral regurgitation, mild left atrial enlargement.  No chest pain, but walking can cause dyspnea, moderate in severity, relieved with rest, no radiation of symptoms. Brought on with exertion. No associated angina. She had stress test which showed inferior ischemia, transient ischemic dilatation with normal ejection fraction. Does water exercises. Had 3 knee replacements. 25 min of stair steppers. She only went 2 minutes on the prior exercise treadmill test before significant shortness of breath ensued. Has essential tremor. Been building since she was 15.  .        ROS:  The other elements of the review of systems are negative.       Medical History: history of breast cancer 2001 followed by oncologist Dr. Donnie Coffin, Essential hypertension with mild concentric left ventricular hypertrophy, benign colon polyps on colonoscopy in 2006 with Dr.Santogade, history of adenomatous colon polyps, Mixed dyslipidemia, Essential tremor.        Family History:        Social History:  General:  History of smoking  cigarettes: Former smoker Quit in year 1993 Pack-year Hx: 30 no Smoking.  Alcohol: yes, occasionally-wine.  no Recreational drug use.  Marital Status: married.        Medications: Fish Oil Capsule 2 capsules BID, Aspirin 81 MG Tablet Chewable 1 tablet Once a day, Multivitamins Tablet 1 tablet daily, Iron 66 MG Tablet 1 tablet daily, Calcium + D Tablet 2 tablets Once a day,  Ketoconazole 2 % Cream 1 application to affected area Once a day, Amoxicillin 500 MG Capsule 1 capsule per dentist, Fenofibrate 160 MG Tablet TAKE 1 TABLET BY MOUTH DAILY , Crestor 10 MG Tablet TAKE 1 TABLET EVERY EVENING , Propranolol HCl 60 MG Tablet 2 tablets in the AM and 1 tablet in the PM daily, Lisinopril 5 MG Tablet 1 tablet Once a day, Medication List reviewed and reconciled with the patient       Allergies: Floxin Otic: nausea:dizziness.       Objective:     Vitals: Wt 271.2, Wt change .8 lb, Ht 68.5, BMI 40.63, Pulse sitting 76, BP sitting 138/92.       Examination:  General Examination:  GENERAL APPEARANCE alert, oriented, NAD, pleasant.  SKIN: normal, no rash.  HEENT: normal.  HEAD: Dora/AT.  EYES: EOMI, Conjunctiva clear.  NECK: supple, FROM, without evidence of thyromegaly, adenopathy, or bruits, no jugular venous distention (JVD).  LUNGS: clear to auscultation bilaterally, no wheezes, rhonchi, rales, regular breathing rate and effort.  HEART: regular rate and rhythm, no S3, S4, soft, difficult to hear murmur left lower sternal border systolic murmur, point of maximul impulse (PMI) difficult to palpate.  ABDOMEN: soft, non-tender/non-distended, bowel sounds present, no masses palpated, no bruit, Overweight.  EXTREMITIES: no clubbing, no edema, difficult to palpate distal pulses.  NEUROLOGIC EXAM: non-focal exam, alert and oriented x 3, essential tremor noted, waiver in voice as well.Marland Kitchen  PERIPHERAL PULSES: normal (2+) bilaterally.  LYMPH NODES: no cervical adenopathy.  PSYCH affect normal.  EKG reviewed, prior lab work reviewed, prior medical records reviewed, prior echocardiogram reviewed.       Assessment:     Assessment:  1. Abnormal cardiovascular stress test - 794.39 (Primary)  2. Mixed hyperlipidemia - 272.2  3. Essential hypertension, benign - 401.1  4. Tremor, essential - 781.0    Plan:     1. Abnormal cardiovascular stress test  LAB: PT (Prothrombin  Time) (161096) (Ordered for 08/11/2011)    Prothrombin Time 11.1 9.1-12.0 - SEC    INR 1.0 0.8-1.2 -     Natsha Guidry 08/13/2011 05:13:33 PM > ok for cath    LAB: Basic Metabolic (Ordered for 08/11/2011)    GLUCOSE 104  70-99 - mg/dL H   BUN 21  0-45 - mg/dL    CREATININE 4.09  8.11-9.14 - mg/dl    eGFR (NON-AFRICAN AMERICAN) 63 >60 - calc    eGFR (AFRICAN AMERICAN) 76 >60 - calc    SODIUM 139  136-145 - mmol/L    POTASSIUM 4.8  3.5-5.5 - mmol/L    CHLORIDE 106  98-107 - mmol/L    C02 29  22-32 - mg/dL    ANION GAP 8.4 7.8-29.5 - mmol/L    CALCIUM 10.2  8.6-10.3 - mg/dL     Claire Dolores 62/13/0865 05:13:33 PM > ok for cath    LAB: CBC with Diff (Ordered for 08/11/2011)    WBC 3.5 4.0-11.0 - K/ul L   RBC 4.30 4.20-5.40 - M/uL    HGB 12.4 12.0-16.0 - g/dL    HCT 78.4 69.6-29.5 - %    MCH 28.8 27.0-33.0 - pg    MPV 9.8 7.5-10.7 - fL    MCV 87.7 81.0-99.0 - fL    MCHC 32.8 32.0-36.0 - g/dL    RDW 28.4 13.2-44.0 - %    NRBC# 0.01 -    PLT 224 150-400 - K/uL    NEUT % 63.8 43.3-71.9 - %    NRBC% 0.30 - %    LYMPH% 20.2 16.8-43.5 - %    MONO % 11.9 4.6-12.4 - %    EOS % 3.5 0.0-7.8 - %    BASO % 0.6 0.0-1.0 - %    NEUT # 2.2 1.9-7.2 - K/uL    LYMPH# 0.70 1.10-2.70 - K/uL L   MONO # 0.4 0.3-0.8 - K/uL    EOS # 0.1 0.0-0.6 - K/uL    BASO # 0.0 0.0-0.1 - K/uL     [facility] 08/13/2011 05:13:33 PM > ok for cath    LAB: PT (Prothrombin Time) (102725) (Ordered for 08/11/2011)    Prothrombin Time 11.1 9.1-12.0 - SEC    INR 1.0 0.8-1.2 -     Amma Crear 08/13/2011 05:13:33 PM > ok for cath    LAB: CBC with Diff (Ordered for 08/11/2011)    WBC 3.5 4.0-11.0 - K/ul L   RBC 4.30 4.20-5.40 - M/uL    HGB 12.4 12.0-16.0 - g/dL    HCT 36.6 44.0-34.7 - %    MCH 28.8 27.0-33.0 - pg    MPV 9.8 7.5-10.7 - fL    MCV 87.7 81.0-99.0 - fL    MCHC 32.8 32.0-36.0 - g/dL    RDW 42.5 95.6-38.7 - %    NRBC# 0.01 -    PLT 224 150-400 - K/uL    NEUT % 63.8 43.3-71.9 - %    NRBC% 0.30 - %     LYMPH% 20.2 16.8-43.5 - %    MONO % 11.9 4.6-12.4 - %  EOS % 3.5 0.0-7.8 - %    BASO % 0.6 0.0-1.0 - %    NEUT # 2.2 1.9-7.2 - K/uL    LYMPH# 0.70 1.10-2.70 - K/uL L   MONO # 0.4 0.3-0.8 - K/uL    EOS # 0.1 0.0-0.6 - K/uL    BASO # 0.0 0.0-0.1 - K/uL     [facility] 08/13/2011 05:13:33 PM > ok for cath    LAB: Basic Metabolic (Ordered for 08/11/2011)    GLUCOSE 104  70-99 - mg/dL H   BUN 21  0-86 - mg/dL    CREATININE 5.78  4.69-6.29 - mg/dl    eGFR (NON-AFRICAN AMERICAN) 63 >60 - calc    eGFR (AFRICAN AMERICAN) 76 >60 - calc    SODIUM 139  136-145 - mmol/L    POTASSIUM 4.8  3.5-5.5 - mmol/L    CHLORIDE 106  98-107 - mmol/L    C02 29  22-32 - mg/dL    ANION GAP 8.4 5.2-84.1 - mmol/L    CALCIUM 10.2  8.6-10.3 - mg/dL     [facility] 32/44/0102 05:13:33 PM > ok for cath    We discussed the abnormality on her stress test today with her husband. Her husband is a former patient of Dr. Amil Amen. He had bypass surgery by Dr. Laneta Simmers. We will go ahead and perform radial access. Risks and benefits of cardiac catheterization have been reviewed including risk of stroke, heart attack, death, bleeding, renal impariment and arterial damage. There was ample oppurtuny to answer questions. Alternatives were discussed. Patient understands and wishes to proceed.        Immunizations:        Labs:        Procedure Codes: 72536 BLOOD COLLECTION ROUTINE VENIPUNCTURE, 80048 ECL BMP, 85025 ECL CBC PLATELET DIFF       Preventive:         Follow Up: post cath      Provider: Donato Schultz, MD  Patient: Vanesa, Renier DOB: [birth year] Date: 08/11/2011

## 2011-08-14 NOTE — Discharge Instructions (Signed)
Radial Site Care Refer to this sheet in the next few weeks. These instructions provide you with information on caring for yourself after your procedure. Your caregiver may also give you more specific instructions. Your treatment has been planned according to current medical practices, but problems sometimes occur. Call your caregiver if you have any problems or questions after your procedure. HOME CARE INSTRUCTIONS  You may shower the day after the procedure.Remove the bandage (dressing) and gently wash the site with plain soap and water.Gently pat the site dry.   Do not apply powder or lotion to the site.   Do not submerge the affected site in water for 3 to 5 days.   Inspect the site at least twice daily.   Do not flex or bend the affected arm for 24 hours.   No lifting over 5 pounds (2.3 kg) for 5 days after your procedure.   Do not drive home if you are discharged the same day of the procedure. Have someone else drive you.   You may drive 24 hours after the procedure unless otherwise instructed by your caregiver.   Do not operate machinery or power tools for 24 hours.   A responsible adult should be with you for the first 24 hours after you arrive home.  What to expect:  Any bruising will usually fade within 1 to 2 weeks.   Blood that collects in the tissue (hematoma) may be painful to the touch. It should usually decrease in size and tenderness within 1 to 2 weeks.  SEEK IMMEDIATE MEDICAL CARE IF:  You have unusual pain at the radial site.   You have redness, warmth, swelling, or pain at the radial site.   You have drainage (other than a small amount of blood on the dressing).   You have chills.   You have a fever or persistent symptoms for more than 72 hours.   You have a fever and your symptoms suddenly get worse.   Your arm becomes pale, cool, tingly, or numb.   You have heavy bleeding from the site. Hold pressure on the site.  Document Released: 04/19/2010  Document Revised: 03/06/2011 Document Reviewed: 04/19/2010 [employer] Patient Information 2012 [employer]. 

## 2011-08-14 NOTE — Interval H&P Note (Signed)
History and Physical Interval Note:  08/14/2011 9:29 AM  Kristin Hood  has presented today for surgery, with the diagnosis of cad  The various methods of treatment have been discussed with the patient and family. After consideration of risks, benefits and other options for treatment, the patient has consented to  Procedure(s) (LRB): LEFT HEART CATHETERIZATION WITH CORONARY ANGIOGRAM (N/A) as a surgical intervention .  The patients' history has been reviewed, patient examined, no change in status, stable for surgery.  I have reviewed the patients' chart and labs.  Questions were answered to the patient's satisfaction.     [name]

## 2011-08-15 MED FILL — Nicardipine HCl IV Soln 2.5 MG/ML: INTRAVENOUS | Qty: 1 | Status: AC

## 2011-11-17 ENCOUNTER — Inpatient Hospital Stay (HOSPITAL_COMMUNITY)
Admission: EM | Admit: 2011-11-17 | Discharge: 2011-11-19 | DRG: 065 | Disposition: A | Discharge status: Home / Self Care | Payer: Medicare Other | Attending: Neurology | Admitting: Neurology

## 2011-11-17 ENCOUNTER — Encounter (HOSPITAL_COMMUNITY): Payer: Self-pay

## 2011-11-17 ENCOUNTER — Inpatient Hospital Stay (HOSPITAL_COMMUNITY): Payer: Medicare Other

## 2011-11-17 ENCOUNTER — Emergency Department (HOSPITAL_COMMUNITY): Payer: Medicare Other

## 2011-11-17 DIAGNOSIS — R609 Edema, unspecified: Secondary | ICD-10-CM | POA: Diagnosis present

## 2011-11-17 DIAGNOSIS — E785 Hyperlipidemia, unspecified: Secondary | ICD-10-CM | POA: Diagnosis present

## 2011-11-17 DIAGNOSIS — G8190 Hemiplegia, unspecified affecting unspecified side: Secondary | ICD-10-CM | POA: Diagnosis present

## 2011-11-17 DIAGNOSIS — R4701 Aphasia: Secondary | ICD-10-CM | POA: Diagnosis present

## 2011-11-17 DIAGNOSIS — I10 Essential (primary) hypertension: Secondary | ICD-10-CM | POA: Diagnosis present

## 2011-11-17 DIAGNOSIS — I639 Cerebral infarction, unspecified: Secondary | ICD-10-CM

## 2011-11-17 DIAGNOSIS — M810 Age-related osteoporosis without current pathological fracture: Secondary | ICD-10-CM | POA: Diagnosis present

## 2011-11-17 DIAGNOSIS — I6350 Cerebral infarction due to unspecified occlusion or stenosis of unspecified cerebral artery: Principal | ICD-10-CM | POA: Diagnosis present

## 2011-11-17 DIAGNOSIS — Z6841 Body Mass Index (BMI) 40.0 and over, adult: Secondary | ICD-10-CM

## 2011-11-17 DIAGNOSIS — T783XXA Angioneurotic edema, initial encounter: Secondary | ICD-10-CM | POA: Diagnosis not present

## 2011-11-17 HISTORY — DX: Cerebral infarction, unspecified: I63.9

## 2011-11-17 HISTORY — DX: Essential (primary) hypertension: I10

## 2011-11-17 LAB — POCT I-STAT, CHEM 8
BUN: 26 mg/dL — ABNORMAL HIGH (ref 6–23)
Calcium, Ion: 1.25 mmol/L (ref 1.13–1.30)
Chloride: 105 meq/L (ref 96–112)
Creatinine, Ser: 1 mg/dL (ref 0.50–1.10)
Glucose, Bld: 121 mg/dL — ABNORMAL HIGH (ref 70–99)
HCT: 39 % (ref 36.0–46.0)
Hemoglobin: 13.3 g/dL (ref 12.0–15.0)
Potassium: 4.1 meq/L (ref 3.5–5.1)
Sodium: 139 mEq/L (ref 135–145)
TCO2: 24 mmol/L (ref 0–100)

## 2011-11-17 LAB — DIFFERENTIAL
Basophils Absolute: 0 K/uL (ref 0.0–0.1)
Basophils Relative: 0 % (ref 0–1)
Eosinophils Absolute: 0.2 K/uL (ref 0.0–0.7)
Eosinophils Relative: 5 % (ref 0–5)
Lymphocytes Relative: 23 % (ref 12–46)
Lymphs Abs: 1.1 K/uL (ref 0.7–4.0)
Monocytes Absolute: 0.5 10*3/uL (ref 0.1–1.0)
Monocytes Relative: 10 % (ref 3–12)
Neutro Abs: 3.1 10*3/uL (ref 1.7–7.7)
Neutrophils Relative %: 63 % (ref 43–77)

## 2011-11-17 LAB — COMPREHENSIVE METABOLIC PANEL
Albumin: 3.8 g/dL (ref 3.5–5.2)
BUN: 24 mg/dL — ABNORMAL HIGH (ref 6–23)
Chloride: 103 mEq/L (ref 96–112)
Creatinine, Ser: 0.81 mg/dL (ref 0.50–1.10)
GFR calc non Af Amer: 71 mL/min — ABNORMAL LOW (ref >90)
Total Bilirubin: 0.6 mg/dL (ref 0.3–1.2)

## 2011-11-17 LAB — CK TOTAL AND CKMB (NOT AT ARMC)
CK, MB: 3 ng/mL (ref 0.3–4.0)
Relative Index: INVALID (ref 0.0–2.5)
Total CK: 86 U/L (ref 7–177)

## 2011-11-17 LAB — CBC
HCT: 39.3 % (ref 36.0–46.0)
Hemoglobin: 13 g/dL (ref 12.0–15.0)
MCH: 28.6 pg (ref 26.0–34.0)
MCHC: 33.1 g/dL (ref 30.0–36.0)
MCV: 86.6 fL (ref 78.0–100.0)
Platelets: 250 K/uL (ref 150–400)
RBC: 4.54 MIL/uL (ref 3.87–5.11)
RDW: 14.7 % (ref 11.5–15.5)
WBC: 4.9 K/uL (ref 4.0–10.5)

## 2011-11-17 LAB — COMPREHENSIVE METABOLIC PANEL WITH GFR
ALT: 21 U/L (ref 0–35)
AST: 30 U/L (ref 0–37)
Alkaline Phosphatase: 43 U/L (ref 39–117)
CO2: 25 meq/L (ref 19–32)
Calcium: 10.2 mg/dL (ref 8.4–10.5)
GFR calc Af Amer: 82 mL/min — ABNORMAL LOW (ref >90)
Glucose, Bld: 121 mg/dL — ABNORMAL HIGH (ref 70–99)
Potassium: 4.1 meq/L (ref 3.5–5.1)
Sodium: 138 meq/L (ref 135–145)
Total Protein: 7.5 g/dL (ref 6.0–8.3)

## 2011-11-17 LAB — APTT: aPTT: 31 seconds (ref 24–37)

## 2011-11-17 LAB — PROTIME-INR
INR: 0.98 (ref 0.00–1.49)
Prothrombin Time: 13.2 s (ref 11.6–15.2)

## 2011-11-17 LAB — TROPONIN I: Troponin I: <0.3 ng/mL (ref <0.30)

## 2011-11-17 MED ORDER — ONDANSETRON HCL 4 MG/2ML IJ SOLN: 4.0000 mg | Freq: Four times a day (QID) | INTRAMUSCULAR | Status: DC | PRN

## 2011-11-17 MED ORDER — SODIUM CHLORIDE 0.9 % IV SOLN
INTRAVENOUS | Status: DC
  Administered 2011-11-18 (×2): via INTRAVENOUS

## 2011-11-17 MED ORDER — ALTEPLASE (STROKE) FULL DOSE INFUSION
90.0000 mg | Freq: Once | INTRAVENOUS | Status: AC
  Administered 2011-11-17: 90 mg via INTRAVENOUS
  Filled 2011-11-17: qty 90

## 2011-11-17 MED ORDER — SENNOSIDES-DOCUSATE SODIUM 8.6-50 MG PO TABS
1.0000 | ORAL_TABLET | Freq: Every evening | ORAL | Status: DC | PRN
  Filled 2011-11-17: qty 1

## 2011-11-17 MED ORDER — ACETAMINOPHEN 325 MG PO TABS
650.0000 mg | ORAL_TABLET | ORAL | Status: DC | PRN
  Administered 2011-11-18 – 2011-11-19 (×2): 650 mg via ORAL
  Filled 2011-11-17 (×2): qty 2

## 2011-11-17 MED ORDER — DIPHENHYDRAMINE HCL 50 MG/ML IJ SOLN
25.0000 mg | Freq: Three times a day (TID) | INTRAMUSCULAR | Status: DC
  Administered 2011-11-17 – 2011-11-19 (×5): 25 mg via INTRAVENOUS
  Filled 2011-11-17 (×2): qty 1
  Filled 2011-11-17 (×3): qty 0.5
  Filled 2011-11-17: qty 1
  Filled 2011-11-17 (×3): qty 0.5

## 2011-11-17 MED ORDER — METHYLPREDNISOLONE SODIUM SUCC 125 MG IJ SOLR
60.0000 mg | Freq: Once | INTRAMUSCULAR | Status: AC
  Administered 2011-11-17: 60 mg via INTRAVENOUS
  Filled 2011-11-17: qty 2

## 2011-11-17 MED ORDER — ACETAMINOPHEN 650 MG RE SUPP: 650.0000 mg | RECTAL | Status: DC | PRN

## 2011-11-17 MED ORDER — SODIUM CHLORIDE 0.9 % IV SOLN: INTRAVENOUS | Status: DC

## 2011-11-17 MED ORDER — LABETALOL HCL 5 MG/ML IV SOLN: 10.0000 mg | INTRAVENOUS | Status: DC | PRN

## 2011-11-17 MED ORDER — PANTOPRAZOLE SODIUM 40 MG IV SOLR
40.0000 mg | Freq: Every day | INTRAVENOUS | Status: DC
  Administered 2011-11-17 – 2011-11-18 (×2): 40 mg via INTRAVENOUS
  Filled 2011-11-17 (×3): qty 40

## 2011-11-17 NOTE — Progress Notes (Signed)
Called to see patient due to swelling of the lower lip.  On evaluation patient reports that she is better from the tPA.  Her speech is fluent.  She is able to lift all extremities against gravity and reports some tingling in the fingertips on the right.  Bottom lip is swollen.  Tongue is normal.  Uvula is normal.  Breath sounds are normal with no evidence of wheezing or respiratory distress.  Symptom felt to be a side effect from tPA Plan: 1. Benadryl 25mg  q8hrs 2. Solumedrol 60mg  IV  Thana Farr, MD [facility] [phone]

## 2011-11-17 NOTE — ED Notes (Signed)
CBG: 113 °

## 2011-11-17 NOTE — ED Notes (Signed)
Per ems- pt lsn was 1730, pt had episode of dizziness and aphasia, when ems arrived pt was unable to speak, en route pt regained some ability, speech is slurred. Pt c/o no pain, no dizziness. HR-82 and irreg, BP-174/90 CBG-121 Right pupil was 5, slow to respond, left was 4, brisk.

## 2011-11-17 NOTE — ED Provider Notes (Signed)
History     CSN: [id number]  Arrival date & time 11/17/11  1818   First MD Initiated Contact with Patient 11/17/11 1823      No chief complaint on file.   (Consider location/radiation/quality/duration/timing/severity/associated sxs/prior treatment) HPI Comments: Pt presents by EMS with stroke like symptoms, code stroke called en route.  Pt last seen normal and spoke to spouse around 5:15 PM.  Pt now with slurred speech, difficulty getting words out.  Possibly right side facial droop.  Pt has h/o breast cancer, surgery on left side, also had cardiac cath in July, no stents placed, minor irregularities noted only based on Dr. Anne Fu note.  Level 5 caveat due to severe illness and urgent need for intervention.    The history is provided by the patient and the EMS personnel.    No past medical history on file.  No past surgical history on file.  No family history on file.  History  Substance Use Topics  . Smoking status: Not on file  . Smokeless tobacco: Not on file  . Alcohol Use: Not on file    OB History    No data available      Review of Systems  Unable to perform ROS: Other    Allergies  Floxin  Home Medications   Current Outpatient Rx  Name Route Sig Dispense Refill  . ASPIRIN 81 MG PO TABS Oral Take 81 mg by mouth daily.    . ATORVASTATIN CALCIUM 10 MG PO TABS Oral Take 10 mg by mouth at bedtime.     Marland Kitchen CALCIUM + D PO Oral Take 1 tablet by mouth daily.    . FENOFIBRATE 160 MG PO TABS Oral Take 160 mg by mouth at bedtime.     Marland Kitchen FERROUS SULFATE 325 (65 FE) MG PO TABS Oral Take 325 mg by mouth at bedtime.     Marland Kitchen LISINOPRIL 5 MG PO TABS Oral Take 5 mg by mouth daily.    . ADULT MULTIVITAMIN W/MINERALS CH Oral Take 1 tablet by mouth daily.    Marland Kitchen FISH OIL PO Oral Take 1,800 mg by mouth 2 (two) times daily.     Marland Kitchen PROPRANOLOL HCL 60 MG PO TABS Oral Take 60 mg by mouth 3 (three) times daily.      BP 160/84  Pulse 72  Resp 16  SpO2 99%  Physical Exam  Nursing  note and vitals reviewed. Constitutional: She appears well-developed and well-nourished.  HENT:  Head: Normocephalic and atraumatic.  Cardiovascular: Normal rate.   Pulmonary/Chest: Effort normal. No respiratory distress. She has no wheezes.  Abdominal: Soft. She exhibits no distension. There is no tenderness.  Neurological: She is alert. No sensory deficit.       Slight facial droop on right, slurred speech, word finding difficulty  Skin: Skin is warm.  Psychiatric: Her mood appears anxious. Her speech is not rapid and/or pressured.    ED Course  Procedures (including critical care time)   CRITICAL CARE Performed by: Lear Ng.   Total critical care time: 30 min  Critical care time was exclusive of separately billable procedures and treating other patients.  Critical care was necessary to treat or prevent imminent or life-threatening deterioration.  Critical care was time spent personally by me on the following activities: development of treatment plan with patient and/or surrogate as well as nursing, discussions with consultants, evaluation of patient's response to treatment, examination of patient, obtaining history from patient or surrogate, ordering and performing treatments and interventions,  ordering and review of laboratory studies, ordering and review of radiographic studies, pulse oximetry and re-evaluation of patient's condition.   Labs Reviewed  POCT I-STAT, CHEM 8 - Abnormal; Notable for the following:    BUN 26 (*)     Glucose, Bld 121 (*)     All other components within normal limits  GLUCOSE, CAPILLARY - Abnormal; Notable for the following:    Glucose-Capillary 113 (*)     All other components within normal limits  PROTIME-INR  APTT  CBC  DIFFERENTIAL  COMPREHENSIVE METABOLIC PANEL  CK TOTAL AND CKMB  TROPONIN I   Ct Head Wo Contrast  11/17/2011  *RADIOLOGY REPORT*  Clinical Data: Code stroke, sudden onset expressive aphasia  CT HEAD WITHOUT  CONTRAST  Technique:  Contiguous axial images were obtained from the base of the skull through the vertex without contrast.  Comparison: 07/29/2004  Findings: No evidence of parenchymal hemorrhage or extra-axial fluid collection. No mass lesion, mass effect, or midline shift.  No CT evidence of acute infarction.  Intracranial atherosclerosis.  Mild global cortical atrophy.  No ventriculomegaly.  The visualized paranasal sinuses are essentially clear. The mastoid air cells are unopacified.  No evidence of calvarial fracture.  IMPRESSION: No evidence of acute intracranial abnormality.  These results were called by telephone on 11/17/2011 at 1840 hours to Dr. Quita Skye, who verbally acknowledged these results.   Original Report Authenticated By: Charline Bills, M.D.      1. Stroke     Oxygen on 2 L nasal cannula is 99% which I interpret to be adequate.  EKG at time 18:51 shows sinus rhythm at a rate of 70. Normal axis, no ST or T-wave abnormalities. EKG is similar to prior EKG on 06/30/2009.  7:11 PM TPA ordered by neurologist, I presume stroke is diagnosis which I also believe is the case.  Pt will need neuro ICU admission.     MDM  Pt with dysphasia, possibly subtle facial weakness.  Pt with h/o HTN, hyperlipidemia and cancer.          Gavin Pound. Oletta Lamas, MD 11/17/11 4540

## 2011-11-17 NOTE — Code Documentation (Signed)
71 year old female brought to ED as Code Stroke.  LSW was 1630 when patient had conversation with her husband.  Later as she was working on her laptop computer the patient could not remember her password.  She attempted to call for her husband and could not find her words.  On arrival to the ED she was alert MAE no weakness no facial droop - she had expressive aphasia but was following commands without any problems.  Went to CT scan and after CT scan she developed worsening symptoms with right facial droop some intermittent right arm weakness and some receptive aphasia.  Dr. Benjiman Core was paged and patient examined by him.  Two peripheral IV lines accessed with some difficulty.  Once established IV tPA was administered per MD order.  BP was well controlled.  Needle time 1918.  See flow sheet for continued tPA monitoring.

## 2011-11-17 NOTE — ED Notes (Signed)
Will hold off on SSS as pt is getting TPA and should remain flat per Rapid Response RN

## 2011-11-17 NOTE — Consult Note (Signed)
Code stroke Admission H&P Consulting physician: ED Reason for consult; Aphasia, right hemiparesis  Chief Complaint:  Aphasia,, right hemiparesis,  HPI: This is a 71 y/o RHWF with significant stroke risk factors who was last seen normal at 4:30 PM And then she had aphasia followed by right hemiparesis Initial NIH scale was 5 and when I saw her the NIH Scale was 8 After reviewing exclusion and inclusion criteria. tPA was decided  Discussed with the husband Patient is awake, alert and follows minimal commands.     Past Medical history 1) Hyperlipidemia 2) Obesity 3) Arthritis 4) coronary Artery disease 5) Hypertension 6) Aortic Sclerosis 7) Osteoporosis   Past Surgical History: 1) Tonsillectomy 2) C section 3) Cardiac Cath 4) Bunion removed 5) B/L knee replacement 7) Lumpectomy 8) Mastectomy 9)  Colonoscopy   Family history: Hypertension and hyperlipidemia  Social History:  does not have a smoking history on file. She does not have any smokeless tobacco history on file. Her alcohol and drug histories not on file.  Allergies:  Allergies  Allergen Reactions  . Floxin (Ofloxacin)     ROS:  patient has expressive aphasia  Physical Examination: Blood pressure 160/84, pulse 72, resp. rate 16, SpO2 99.00%.   General:  Awake, with expressive aphasia HEENT: Right neglect, pupils reactive, corneal reactive Neck: Supple, no JVD, Lungs; CTA Heart: RRR,  SEm, no rubs and no gallops Abdomen: Soft, NT/ND BS present Skin; no rash Extremities; No edema  Neurologic Examination: Mental status: Awake, expressive aphasia  Cranial Nerves: Right neglect ( mild) Right facial droop Tongue midline Hemifacial sensory loss on the right   Motor; Right : 4/5  UE and LE Left 5/5  UE and LE Reflexes 2 Sensory: Pinprick and light touch intact throughout, bilaterally Right: downgoing   Left: downgoing  Results for orders placed during the hospital encounter of 11/17/11 (from  the past 48 hour(s))  PROTIME-INR     Status: Normal   Collection Time   11/17/11  6:37 PM      Component Value Range Comment   Prothrombin Time 13.2  11.6 - 15.2 seconds    INR 0.98  0.00 - 1.49   APTT     Status: Normal   Collection Time   11/17/11  6:37 PM      Component Value Range Comment   aPTT 31  24 - 37 seconds   CBC     Status: Normal   Collection Time   11/17/11  6:37 PM      Component Value Range Comment   WBC 4.9  4.0 - 10.5 K/uL    RBC 4.54  3.87 - 5.11 MIL/uL    Hemoglobin 13.0  12.0 - 15.0 g/dL    HCT 29.5  28.4 - 13.2 %    MCV 86.6  78.0 - 100.0 fL    MCH 28.6  26.0 - 34.0 pg    MCHC 33.1  30.0 - 36.0 g/dL    RDW 44.0  10.2 - 72.5 %    Platelets 250  150 - 400 K/uL   DIFFERENTIAL     Status: Normal   Collection Time   11/17/11  6:37 PM      Component Value Range Comment   Neutrophils Relative 63  43 - 77 %    Neutro Abs 3.1  1.7 - 7.7 K/uL    Lymphocytes Relative 23  12 - 46 %    Lymphs Abs 1.1  0.7 - 4.0 K/uL    Monocytes  Relative 10  3 - 12 %    Monocytes Absolute 0.5  0.1 - 1.0 K/uL    Eosinophils Relative 5  0 - 5 %    Eosinophils Absolute 0.2  0.0 - 0.7 K/uL    Basophils Relative 0  0 - 1 %    Basophils Absolute 0.0  0.0 - 0.1 K/uL   POCT I-STAT, CHEM 8     Status: Abnormal   Collection Time   11/17/11  6:50 PM      Component Value Range Comment   Sodium 139  135 - 145 mEq/L    Potassium 4.1  3.5 - 5.1 mEq/L    Chloride 105  96 - 112 mEq/L    BUN 26 (*) 6 - 23 mg/dL    Creatinine, Ser 1.61  0.50 - 1.10 mg/dL    Glucose, Bld 096 (*) 70 - 99 mg/dL    Calcium, Ion 0.45  4.09 - 1.30 mmol/L    TCO2 24  0 - 100 mmol/L    Hemoglobin 13.3  12.0 - 15.0 g/dL    HCT 81.1  91.4 - 78.2 %   GLUCOSE, CAPILLARY     Status: Abnormal   Collection Time   11/17/11  7:06 PM      Component Value Range Comment   Glucose-Capillary 113 (*) 70 - 99 mg/dL    Comment 1 Notify RN      Comment 2 Documented in Chart      Ct Head Wo Contrast  11/17/2011  *RADIOLOGY  REPORT*  Clinical Data: Code stroke, sudden onset expressive aphasia  CT HEAD WITHOUT CONTRAST  Technique:  Contiguous axial images were obtained from the base of the skull through the vertex without contrast.  Comparison: 07/29/2004  Findings: No evidence of parenchymal hemorrhage or extra-axial fluid collection. No mass lesion, mass effect, or midline shift.  No CT evidence of acute infarction.  Intracranial atherosclerosis.  Mild global cortical atrophy.  No ventriculomegaly.  The visualized paranasal sinuses are essentially clear. The mastoid air cells are unopacified.  No evidence of calvarial fracture.  IMPRESSION: No evidence of acute intracranial abnormality.  These results were called by telephone on 11/17/2011 at 1840 hours to Dr. Quita Skye, who verbally acknowledged these results.   Original Report Authenticated By: Charline Bills, M.D.     Assessment/Plan  This is a 71 y/o WF with stroke risk factors.  Had acute onset of expressive aphasia followed by right hemiparesis, right facial droop.  NIH 8 After exclusion and inclusion criteria, discussing with her husband.  IV tPA was decided  Reviewed Head CT Reviewed CBC Reviewed BP parameters  Recommendations: Admit to ICU With stroke parameters BP 180/90 maintenance No anticoagulations  Sequentials Repeat Head CT post TPA   Thank you for the consult

## 2011-11-18 ENCOUNTER — Inpatient Hospital Stay (HOSPITAL_COMMUNITY): Payer: Medicare Other

## 2011-11-18 DIAGNOSIS — I6350 Cerebral infarction due to unspecified occlusion or stenosis of unspecified cerebral artery: Secondary | ICD-10-CM

## 2011-11-18 DIAGNOSIS — T783XXA Angioneurotic edema, initial encounter: Secondary | ICD-10-CM | POA: Diagnosis not present

## 2011-11-18 HISTORY — DX: Cerebral infarction due to unspecified occlusion or stenosis of unspecified cerebral artery: I63.50

## 2011-11-18 LAB — LIPID PANEL
Cholesterol: 148 mg/dL (ref 0–200)
Total CHOL/HDL Ratio: 4.4 RATIO
Triglycerides: 101 mg/dL (ref <150)
VLDL: 20 mg/dL (ref 0–40)

## 2011-11-18 MED ORDER — ASPIRIN EC 81 MG PO TBEC
81.0000 mg | DELAYED_RELEASE_TABLET | Freq: Every day | ORAL | Status: DC
  Administered 2011-11-18: 81 mg via ORAL
  Filled 2011-11-18 (×3): qty 1

## 2011-11-18 NOTE — Progress Notes (Signed)
*  PRELIMINARY RESULTS* Vascular Ultrasound Carotid Duplex (Doppler) has been completed.  Preliminary findings: Bilaterally 40-59% ICA stenosis with antegrade vertebral flow.  Farrel Demark, RDMS, RVT  11/18/2011, 11:44 AM

## 2011-11-18 NOTE — Progress Notes (Signed)
OT Cancellation Note  Treatment cancelled today due to:  Bedrest order set. OT to continue to follow acutely and await increased activity orders.   Harrel Carina [place]   OTR/L Pager: [phone] Office: [phone] .

## 2011-11-18 NOTE — Clinical Documentation Improvement (Signed)
BMI DOCUMENTATION CLARIFICATION QUERY  THIS DOCUMENT IS NOT A PERMANENT PART OF THE MEDICAL RECORD         11/18/11  Dear Larita Fife,  In an effort to better capture your patient's severity of illness, reflect appropriate length of stay and utilization of resources, a review of the patient medical record has revealed the following indicators.   Based on your clinical judgment, please clarify and document in a progress note and/or discharge summary the clinical condition associated with the following supporting information: In responding to this query please exercise your independent judgment.  The fact that a query is asked, does not imply that any particular answer is desired or expected.   According to the documented Height and Weight in CHL/EPIC, the patients BMI is greater than 40. If your clinical findings/judgment agrees with this, could you please document this along with the related diagnosis in the progress note and discharge summary. THANK YOU!    [facility]ACT[facility]: A diagnosis of UNDERWEIGHT or MORBID OBESITY should have the BMI documented along with it.  Possible Clinical Conditions?  - Morbid Obesity  - Other condition (please document in the progress notes and/or discharge summary)  - Cannot Clinically determine at this time    Supporting Information:  - Height= 5'4" - Weight= 266 lbs - BMI= 45.9 (from Doc Flowsheets on 8/19)    Reviewed: additional documentation in the medical record   Thank You,  Saul Fordyce  Clinical Documentation Specialist: [phone] Pager  Health Information Management [place]

## 2011-11-18 NOTE — Evaluation (Signed)
Occupational Therapy Evaluation Patient Details Name: Kristin Hood MRN: [id number] DOB: 1941/02/04 Today's Date: 11/18/2011 Time: 6962-9528 OT Time Calculation (min): 11 min  OT Assessment / Plan / Recommendation Clinical Impression  71 yo female admitted with aphasia and weakness. Pt provided Tpa and now at or near baseline. Ot to sign off.    OT Assessment  Patient does not need any further OT services    Follow Up Recommendations  No OT follow up    Barriers to Discharge      Equipment Recommendations  None recommended by OT    Recommendations for Other Services    Frequency       Precautions / Restrictions Precautions Precautions: None   Pertinent Vitals/Pain None    ADL  Grooming: Performed;Wash/dry hands;Modified independent Where Assessed - Grooming: Unsupported standing Upper Body Dressing: Performed;Modified independent Where Assessed - Upper Body Dressing: Unsupported sitting Lower Body Dressing: Performed;Modified independent Where Assessed - Lower Body Dressing: Unsupported sitting (don socks) Toilet Transfer: Performed;Modified independent Toilet Transfer Method: Sit to Barista: Regular height toilet Toileting - Clothing Manipulation and Hygiene: Performed;Modified independent Where Assessed - Toileting Clothing Manipulation and Hygiene: Sit to stand from 3-in-1 or toilet Equipment Used: Gait belt Transfers/Ambulation Related to ADLs: Pt moving Mod I - at or near baseline ADL Comments: Pt at or near baseline for all adls. husband present and agrees. Pt completed bed mobility, LB dressing. toilet transfer and grooming at sink level.    OT Diagnosis:    OT Problem List:   OT Treatment Interventions:     OT Goals    Visit Information  Last OT Received On: 11/18/11 Assistance Needed: +1 PT/OT Co-Evaluation/Treatment: Yes    Subjective Data  Subjective: "you can think it and know it but it just wouldnt come out"- pt  describing inability to talk Patient Stated Goal: to take of grandkids   Prior Functioning  Vision/Perception  Home Living Lives With: Spouse Available Help at Discharge: Available 24 hours/day Type of Home: House Home Access: Level entry Home Layout: One level Bathroom Shower/Tub: Walk-in shower;Door [employer] Toilet: Standard Bathroom Accessibility: Yes How Accessible: Accessible via walker Home Adaptive Equipment: Bedside commode/3-in-1;Straight cane;Walker - rolling Prior Function Level of Independence: Independent Able to Take Stairs?: Yes Driving: Yes 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 difficulties Dominant Hand: Right      Cognition  Overall Cognitive Status: Appears within functional limits for tasks assessed/performed Arousal/Alertness: Awake/alert Orientation Level: Appears intact for tasks assessed;Oriented X4 / Intact Behavior During Session: [facility] for tasks performed    Extremity/Trunk Assessment Right Upper Extremity Assessment RUE ROM/Strength/Tone: Within functional levels RUE Sensation: WFL - Light Touch RUE Coordination: WFL - gross/fine motor Left Upper Extremity Assessment LUE ROM/Strength/Tone: Within functional levels LUE Sensation: WFL - Light Touch LUE Coordination: WFL - gross/fine motor Right Lower Extremity Assessment RLE ROM/Strength/Tone: Within functional levels RLE Sensation: WFL - Light Touch RLE Coordination: WFL - gross/fine motor Left Lower Extremity Assessment LLE ROM/Strength/Tone: Within functional levels LLE Sensation: WFL - Light Touch LLE Coordination: WFL - gross/fine motor Trunk Assessment Trunk Assessment: Normal   Mobility Bed Mobility Bed Mobility: Supine to Sit;Sitting - Scoot to Edge of Bed Supine to Sit: 7: Independent Sitting - Scoot to [place] of Bed: 7: Independent Transfers Sit to Stand: 7: Independent Stand to Sit: 7: Independent Details for Transfer Assistance: safety minded   Exercise      Balance Balance Balance Assessed:  (steady whether sitting or standing)  End of Session OT -  End of Session Activity Tolerance: Patient tolerated treatment well Patient left: Other (comment) (with PT ambulating) Nurse Communication: Mobility status  GO     Harrel Carina [facility] 11/18/2011, 1:32 PM Pager: [phone]

## 2011-11-18 NOTE — Progress Notes (Signed)
Stroke Team Progress Note  HISTORY  This is a 71 y/o RHWF with significant stroke risk factors who was last seen normal at 4:30 PM  And then she had aphasia followed by right hemiparesis  Initial NIH scale was 5 and when Dr Parekh saw her the NIH Scale was 8    After reviewing exclusion and inclusion criteria. tPA was decided  Discussed with the husband  Later that evening Dr Reynolds was called to see patient due to swelling of the lower lip. On evaluation patient reports that she is better from the tPA. Her speech is fluent. She is able to lift all extremities against gravity and reports some tingling in the fingertips on the right. Bottom lip is swollen. She was given benadryl and solumedrol for lip angioedema and is doing better. BP has been well controlled and her symptoms have resolved.   SUBJECTIVE Her husband is at the bedside.  Overall she feels her condition is rapidly improving. She states that tPA helped with her sx. She had some lip swelling and this has returned to normal. She was treated for this reaction  OBJECTIVE Most recent Vital Signs: Filed Vitals:   11/18/11 0530 11/18/11 0600 11/18/11 0630 11/18/11 0700  BP: 141/59 145/60 153/55 140/50  Pulse: 71 72 71 72  Temp:    97.8 F (36.6 C)  TempSrc:    Oral  Resp: 17 19 17 17  Height:      Weight:      SpO2: 93% 92% 93% 93%   CBG (last 3)   Basename 11/17/11 1906  GLUCAP 113*   Intake/Output from previous day: 08/19 0701 - 08/20 0700 In: 1020 [P.O.:120; I.V.:900] Out: -   IV Fluid Intake:     . sodium chloride 75 mL/hr at 11/18/11 0547    MEDICATIONS    . alteplase  90 mg Intravenous Once  . diphenhydrAMINE  25 mg Intravenous Q8H  . methylPREDNISolone (SOLU-MEDROL) injection  60 mg Intravenous Once  . pantoprazole (PROTONIX) IV  40 mg Intravenous QHS  . DISCONTD: sodium chloride   Intravenous STAT   PRN:  acetaminophen, acetaminophen, labetalol, ondansetron (ZOFRAN) IV, senna-docusate  Diet:    NPO Activity:  Bedrest DVT Prophylaxis: SCD  CLINICALLY SIGNIFICANT STUDIES Basic Metabolic Panel:  Lab 11/17/11 1850 11/17/11 1837  NA 139 138  K 4.1 4.1  CL 105 103  CO2 -- 25  GLUCOSE 121* 121*  BUN 26* 24*  CREATININE 1.00 0.81  CALCIUM -- 10.2  MG -- --  PHOS -- --   Liver Function Tests:  Lab 11/17/11 1837  AST 30  ALT 21  ALKPHOS 43  BILITOT 0.6  PROT 7.5  ALBUMIN 3.8   CBC:  Lab 11/17/11 1850 11/17/11 1837  WBC -- 4.9  NEUTROABS -- 3.1  HGB 13.3 13.0  HCT 39.0 39.3  MCV -- 86.6  PLT -- 250   Coagulation:  Lab 11/17/11 1837  LABPROT 13.2  INR 0.98   Cardiac Enzymes:  Lab 11/17/11 1837  CKTOTAL 86  CKMB 3.0  CKMBINDEX --  TROPONINI <0.30   Lipid Panel    Component Value Date/Time   CHOL 148 11/18/2011 0420   TRIG 101 11/18/2011 0420   HDL 34* 11/18/2011 0420   CHOLHDL 4.4 11/18/2011 0420   VLDL 20 11/18/2011 0420   LDLCALC 94 11/18/2011 0420    Ct Head Wo Contrast  11/17/2011 No evidence of acute intracranial abnormality.  These results were called by telephone on 11/17/2011 at 1840   hours to Dr. Michael Ghim, who verbally acknowledged these results.   Original Report Authenticated By: SRIYESH KRISHNAN, M.D.    MRI of the brain  pending  MRA of the brain  pending  2D Echocardiogram  pending  Carotid Doppler  pending  EKG  normal sinus rhythm, rate 70.  Therapy Recommendations PT - ; OT - ; ST -   Physical Exam  Pleasant elderly Caucasian lady not in distress.Awake alert. Afebrile. Head is nontraumatic. Neck is supple without bruit. Hearing is normal. Cardiac exam no murmur or gallop. Lungs are clear to auscultation. Distal pulses are well felt.  Neurological Exam : Awake  Alert oriented x 3. Normal speech and language.Able to name, repeat and comprehend well.Fundi not visualized.Visual acuity and fields seem adequate.eye movements full without nystagmus. Face symmetric. Tongue midline. Normal strength, tone, reflexes and coordination. Normal  sensation. Gait deferred.  ASSESSMENT [name] is a 71 y.o. female presenting with aphasia and right hemiparesis  . Status post IV t-PA full dose at 1918.  Infarct felt to be embolic secondary to cardiac source, workup ongoing.  On aspirin 81 mg orally every day prior to admission. Now on has just finished tPA within past 24 hours for secondary stroke prevention. If scan is ok, will start aspirin, but may add plavix in am. Patient with resultant weakness. -hyperlipidemia -obesity -arthritis -hypertension -heart murmur (Dr. Skains). Has had a stress test and says that it was abnormal. Had a heart cath and was "fine" per patient -osteoporosis  Hospital day # 1  TREATMENT/PLAN -Will add aspirin for secondary stroke prevention if CT/MRI negative post tpa. -Stroke workup 2D Echo, carotid dopplers -TEE for source of clot. Will continue to monitor for dysrhymia. -Aggressive management of risk factors. LDL 94. Hgb A1c is pending. -OOB to work with therapy.  -Swallow study pending. -D/w patient, husband and answered questions. This patient is critically ill and at significant risk of neurological worsening, death and care requires constant monitoring of vital signs, hemodynamics,respiratory and cardiac monitoring,review of multiple databases, neurological assessment, discussion with family, other specialists and medical decision making of high complexity. I spent 30 minutes of neurocritical care time  in the care of  this patient.  Lynn Brown, PAC,  MBA, MHA [facility] Pager: [phone] 11/18/2011 8:01 AM  Scribe for Dr. Adabella Stanis, Stroke Center Medical Director. He has personally reviewed chart, pertinent data, examined the patient and developed the plan of care. Pager:  [phone]   Shahir Karen, MD Medical Director [facility] Pager: [phone] 11/18/2011 10:22 AM  

## 2011-11-18 NOTE — Evaluation (Addendum)
@@@   No further PT needs at this time, D/C@@@  Physical Therapy Evaluation Patient Details Name: Kristin Hood MRN: [id number] DOB: [birth year] Today's Date: 11/18/2011 Time: 4098-1191 PT Time Calculation (min): 22 min  PT Assessment / Plan / Recommendation Clinical Impression  pt admitted with R sided weakness and aphasia.  On eval all s/s have resolved and pt is able to complete high level tasks [facility] and per Pt., no detectable residual deficits.  No further PT needs, D/C from PT.    PT Assessment  Patent does not need any further PT services    Follow Up Recommendations  No PT follow up    Barriers to Discharge        Equipment Recommendations       Recommendations for Other Services     Frequency      Precautions / Restrictions Precautions Precautions: None   Pertinent Vitals/Pain       Mobility  Bed Mobility Bed Mobility: Supine to Sit;Sitting - Scoot to Edge of Bed Supine to Sit: 7: Independent Sitting - Scoot to [place] of Bed: 7: Independent Transfers Transfers: Sit to Stand;Stand to Sit Sit to Stand: 7: Independent Stand to Sit: 7: Independent Details for Transfer Assistance: safety minded Ambulation/Gait Ambulation/Gait Assistance: 7: Independent Ambulation Distance (Feet): 250 Feet Assistive device: None Gait Pattern: Within Functional Limits Stairs: Yes Stairs Assistance: 6: Modified independent (Device/Increase time) Stair Management Technique: One rail Right;Alternating pattern;Forwards Number of Stairs: 3  Modified Rankin (Stroke Patients Only) Pre-Morbid Rankin Score: No symptoms Modified Rankin: No symptoms    Exercises     PT Diagnosis:    PT Problem List:   PT Treatment Interventions:     PT Goals    Visit Information  Last PT Received On: 11/18/11 Assistance Needed: +1    Subjective Data  Subjective: I think I'm okay now Patient Stated Goal: Back home, doing my normal stuff   Prior Functioning  Home Living Lives With:  Spouse Available Help at Discharge: Available 24 hours/day Type of Home: House Home Access: Level entry Home Layout: One level Bathroom Shower/Tub: Walk-in shower;Door [employer] Toilet: Standard Bathroom Accessibility: Yes How Accessible: Accessible via walker Home Adaptive Equipment: Bedside commode/3-in-1;Straight cane;Walker - rolling Prior Function Level of Independence: Independent Able to Take Stairs?: Yes Driving: Yes 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 difficulties Dominant Hand: Right    Cognition  Overall Cognitive Status: Appears within functional limits for tasks assessed/performed Arousal/Alertness: Awake/alert Orientation Level: Appears intact for tasks assessed;Oriented X4 / Intact Behavior During Session: [facility] for tasks performed    Extremity/Trunk Assessment Right Lower Extremity Assessment RLE ROM/Strength/Tone: Within functional levels RLE Sensation: WFL - Light Touch RLE Coordination: WFL - gross/fine motor Left Lower Extremity Assessment LLE ROM/Strength/Tone: Within functional levels LLE Sensation: WFL - Light Touch LLE Coordination: WFL - gross/fine motor Trunk Assessment Trunk Assessment: Normal   Balance Balance Balance Assessed:  (steady whether sitting or standing)  End of Session PT - End of Session Activity Tolerance: Patient tolerated treatment well Patient left: in chair;with call bell/phone within reach;with family/visitor present Nurse Communication: Mobility status  GP     Sahar Ryback, Eliseo Gum 11/18/2011, 12:25 PM  11/18/2011  Kamas Bing, PT [phone] (pager)

## 2011-11-19 ENCOUNTER — Encounter (HOSPITAL_COMMUNITY): Payer: Self-pay | Admitting: *Deleted

## 2011-11-19 ENCOUNTER — Encounter (HOSPITAL_COMMUNITY)
Admission: EM | Disposition: A | Discharge status: Home / Self Care | Payer: Self-pay | Source: Home / Self Care | Attending: Neurology

## 2011-11-19 DIAGNOSIS — I639 Cerebral infarction, unspecified: Secondary | ICD-10-CM

## 2011-11-19 HISTORY — PX: TEE WITHOUT CARDIOVERSION: SHX5443

## 2011-11-19 SURGERY — ECHOCARDIOGRAM, TRANSESOPHAGEAL
Anesthesia: Moderate Sedation

## 2011-11-19 MED ORDER — CLOPIDOGREL BISULFATE 75 MG PO TABS: 75.0000 mg | ORAL_TABLET | Freq: Every day | ORAL | Status: AC

## 2011-11-19 MED ORDER — MIDAZOLAM HCL 10 MG/2ML IJ SOLN
INTRAMUSCULAR | Status: DC | PRN
  Administered 2011-11-19: 1 mg via INTRAVENOUS
  Administered 2011-11-19: 2 mg via INTRAVENOUS
  Administered 2011-11-19 (×2): 1 mg via INTRAVENOUS

## 2011-11-19 MED ORDER — FERROUS SULFATE 325 (65 FE) MG PO TABS
325.0000 mg | ORAL_TABLET | Freq: Every day | ORAL | Status: DC
  Filled 2011-11-19: qty 1

## 2011-11-19 MED ORDER — FENTANYL CITRATE 0.05 MG/ML IJ SOLN
INTRAMUSCULAR | Status: DC | PRN
  Administered 2011-11-19 (×2): 25 ug via INTRAVENOUS

## 2011-11-19 MED ORDER — PROPRANOLOL HCL 60 MG PO TABS
60.0000 mg | ORAL_TABLET | Freq: Three times a day (TID) | ORAL | Status: DC
  Filled 2011-11-19: qty 1

## 2011-11-19 MED ORDER — ASPIRIN 325 MG PO TABS
325.0000 mg | ORAL_TABLET | Freq: Every day | ORAL | Status: DC
  Filled 2011-11-19: qty 1

## 2011-11-19 MED ORDER — LIDOCAINE VISCOUS 2 % MT SOLN
OROMUCOSAL | Status: DC | PRN
  Administered 2011-11-19: 10 mL via OROMUCOSAL

## 2011-11-19 MED ORDER — LIDOCAINE VISCOUS 2 % MT SOLN
OROMUCOSAL | Status: AC
  Filled 2011-11-19: qty 15

## 2011-11-19 MED ORDER — ADULT MULTIVITAMIN W/MINERALS CH: 1.0000 | ORAL_TABLET | Freq: Every day | ORAL | Status: DC

## 2011-11-19 MED ORDER — FENTANYL CITRATE 0.05 MG/ML IJ SOLN
INTRAMUSCULAR | Status: AC
  Filled 2011-11-19: qty 2

## 2011-11-19 MED ORDER — ASPIRIN 81 MG PO TABS: 81.0000 mg | ORAL_TABLET | Freq: Every day | ORAL | Status: DC

## 2011-11-19 MED ORDER — LISINOPRIL 5 MG PO TABS: 5.0000 mg | ORAL_TABLET | Freq: Every day | ORAL | Status: DC

## 2011-11-19 MED ORDER — FENOFIBRATE 160 MG PO TABS
160.0000 mg | ORAL_TABLET | Freq: Every day | ORAL | Status: DC
  Filled 2011-11-19: qty 1

## 2011-11-19 MED ORDER — ATORVASTATIN CALCIUM 10 MG PO TABS
10.0000 mg | ORAL_TABLET | Freq: Every day | ORAL | Status: DC
  Filled 2011-11-19: qty 1

## 2011-11-19 MED ORDER — BUTAMBEN-TETRACAINE-BENZOCAINE 2-2-14 % EX AERO
INHALATION_SPRAY | CUTANEOUS | Status: DC | PRN
  Administered 2011-11-19: 2 via TOPICAL

## 2011-11-19 MED ORDER — MIDAZOLAM HCL 5 MG/ML IJ SOLN
INTRAMUSCULAR | Status: AC
  Filled 2011-11-19: qty 2

## 2011-11-19 NOTE — Interval H&P Note (Signed)
History and Physical Interval Note:  11/19/2011 3:19 PM  Kristin Hood  has presented today for surgery, with the diagnosis of stroke  The various methods of treatment have been discussed with the patient and family. After consideration of risks, benefits and other options for treatment, the patient has consented to  Procedure(s) (LRB): TRANSESOPHAGEAL ECHOCARDIOGRAM (TEE) (N/A) as a surgical intervention .  The patient's history has been reviewed, patient examined, no change in status, stable for surgery.  I have reviewed the patient's chart and labs.  Questions were answered to the patient's satisfaction.     Raudel Bazen

## 2011-11-19 NOTE — H&P (View-Only) (Signed)
Stroke Team Progress Note  HISTORY  This is a 71 y/o RHWF with significant stroke risk factors who was last seen normal at 4:30 PM  And then she had aphasia followed by right hemiparesis  Initial NIH scale was 5 and when Dr Eilleen Kempf saw her the NIH Scale was 8    After reviewing exclusion and inclusion criteria. tPA was decided  Discussed with the husband  Later that evening Dr Thad Ranger was called to see patient due to swelling of the lower lip. On evaluation patient reports that she is better from the tPA. Her speech is fluent. She is able to lift all extremities against gravity and reports some tingling in the fingertips on the right. Bottom lip is swollen. She was given benadryl and solumedrol for lip angioedema and is doing better. BP has been well controlled and her symptoms have resolved.   SUBJECTIVE Her husband is at the bedside.  Overall she feels her condition is rapidly improving. She states that tPA helped with her sx. She had some lip swelling and this has returned to normal. She was treated for this reaction  OBJECTIVE Most recent Vital Signs: Filed Vitals:   11/18/11 0530 11/18/11 0600 11/18/11 0630 11/18/11 0700  BP: 141/59 145/60 153/55 140/50  Pulse: 71 72 71 72  Temp:    97.8 F (36.6 C)  TempSrc:    Oral  Resp: 17 19 17 17   Height:      Weight:      SpO2: 93% 92% 93% 93%   CBG (last 3)   Basename 11/17/11 1906  GLUCAP 113*   Intake/Output from previous day: 08/19 0701 - 08/20 0700 In: 1020 [P.O.:120; I.V.:900] Out: -   IV Fluid Intake:     . sodium chloride 75 mL/hr at 11/18/11 0547    MEDICATIONS    . alteplase  90 mg Intravenous Once  . diphenhydrAMINE  25 mg Intravenous Q8H  . methylPREDNISolone (SOLU-MEDROL) injection  60 mg Intravenous Once  . pantoprazole (PROTONIX) IV  40 mg Intravenous QHS  . DISCONTD: sodium chloride   Intravenous STAT   PRN:  acetaminophen, acetaminophen, labetalol, ondansetron (ZOFRAN) IV, senna-docusate  Diet:    NPO Activity:  Bedrest DVT Prophylaxis: SCD  CLINICALLY SIGNIFICANT STUDIES Basic Metabolic Panel:  Lab 11/17/11 1610 11/17/11 1837  NA 139 138  K 4.1 4.1  CL 105 103  CO2 -- 25  GLUCOSE 121* 121*  BUN 26* 24*  CREATININE 1.00 0.81  CALCIUM -- 10.2  MG -- --  PHOS -- --   Liver Function Tests:  Lab 11/17/11 1837  AST 30  ALT 21  ALKPHOS 43  BILITOT 0.6  PROT 7.5  ALBUMIN 3.8   CBC:  Lab 11/17/11 1850 11/17/11 1837  WBC -- 4.9  NEUTROABS -- 3.1  HGB 13.3 13.0  HCT 39.0 39.3  MCV -- 86.6  PLT -- 250   Coagulation:  Lab 11/17/11 1837  LABPROT 13.2  INR 0.98   Cardiac Enzymes:  Lab 11/17/11 1837  CKTOTAL 86  CKMB 3.0  CKMBINDEX --  TROPONINI <0.30   Lipid Panel    Component Value Date/Time   CHOL 148 11/18/2011 0420   TRIG 101 11/18/2011 0420   HDL 34* 11/18/2011 0420   CHOLHDL 4.4 11/18/2011 0420   VLDL 20 11/18/2011 0420   LDLCALC 94 11/18/2011 0420    Ct Head Wo Contrast  11/17/2011 No evidence of acute intracranial abnormality.  These results were called by telephone on 11/17/2011 at 1840  hours to Dr. Quita Skye, who verbally acknowledged these results.   Original Report Authenticated By: Charline Bills, M.D.    MRI of the brain  pending  MRA of the brain  pending  2D Echocardiogram  pending  Carotid Doppler  pending  EKG  normal sinus rhythm, rate 70.  Therapy Recommendations PT - ; OT - ; ST -   Physical Exam  Pleasant elderly Caucasian lady not in distress.Awake alert. Afebrile. Head is nontraumatic. Neck is supple without bruit. Hearing is normal. Cardiac exam no murmur or gallop. Lungs are clear to auscultation. Distal pulses are well felt.  Neurological Exam : Awake  Alert oriented x 3. Normal speech and language.Able to name, repeat and comprehend well.Fundi not visualized.Visual acuity and fields seem adequate.eye movements full without nystagmus. Face symmetric. Tongue midline. Normal strength, tone, reflexes and coordination. Normal  sensation. Gait deferred.  ASSESSMENT Kristin Hood is a 71 y.o. female presenting with aphasia and right hemiparesis  . Status post IV t-PA full dose at 1918.  Infarct felt to be embolic secondary to cardiac source, workup ongoing.  On aspirin 81 mg orally every day prior to admission. Now on has just finished tPA within past 24 hours for secondary stroke prevention. If scan is ok, will start aspirin, but may add plavix in am. Patient with resultant weakness. -hyperlipidemia -obesity -arthritis -hypertension -heart murmur (Dr. Anne Fu). Has had a stress test and says that it was abnormal. Had a heart cath and was "fine" per patient -osteoporosis  Hospital day # 1  TREATMENT/PLAN -Will add aspirin for secondary stroke prevention if CT/MRI negative post tpa. -Stroke workup 2D Echo, carotid dopplers -TEE for source of clot. Will continue to monitor for dysrhymia. -Aggressive management of risk factors. LDL 94. Hgb A1c is pending. -OOB to work with therapy.  -Swallow study pending. -D/w patient, husband and answered questions. This patient is critically ill and at significant risk of neurological worsening, death and care requires constant monitoring of vital signs, hemodynamics,respiratory and cardiac monitoring,review of multiple databases, neurological assessment, discussion with family, other specialists and medical decision making of high complexity. I spent 30 minutes of neurocritical care time  in the care of  this patient.  Guy Franco, [facility],  MBA, MHA [facility] Pager: [phone] 11/18/2011 8:01 AM  Scribe for Dr. Delia Heady, Stroke Center Medical Director. He has personally reviewed chart, pertinent data, examined the patient and developed the plan of care. Pager:  [phone]   Delia Heady, MD Medical Director [facility] Pager: [phone] 11/18/2011 10:22 AM

## 2011-11-19 NOTE — Discharge Summary (Signed)
Stroke Discharge Summary  Patient ID: Kristin Hood   MRN: [id number]      DOB: Dec 31, 1940  Date of Admission: 11/17/2011 Date of Discharge: 11/19/2011  Attending Physician:  Darcella Cheshire, MD, Stroke MD  Consulting Physician(s):   Treatment Team:  [facility] Stroke, MD cardiology (Dr. Donato Schultz)   Discharge Diagnoses:  Principal Problem: -Unspecified cerebral artery occlusion with cerebral infarction, s/p tPA -lip edema with tPA, treated and reversed  Active Problems:  Morbid obesity with BMI of 45.0-49.9, adult  Stroke  Hypertension  Hyperlipidemia   BMI: Body mass index is 45.83 kg/(m^2).  Past Medical History  Diagnosis Date  . Cancer   . Hypertension    Past Surgical History  Procedure Date  . Cardiac catheterization   . Mastectomy     left     Medication List  As of 11/19/2011  6:05 PM   STOP taking these medications         aspirin 81 MG tablet         TAKE these medications         atorvastatin 10 MG tablet   Commonly known as: LIPITOR   Take 10 mg by mouth at bedtime.      CALCIUM + D PO   Take 1 tablet by mouth daily.      clopidogrel 75 MG tablet   Commonly known as: PLAVIX   Take 1 tablet (75 mg total) by mouth daily.      fenofibrate 160 MG tablet   Take 160 mg by mouth at bedtime.      ferrous sulfate 325 (65 FE) MG tablet   Take 325 mg by mouth at bedtime.      FISH OIL PO   Take 1,800 mg by mouth 2 (two) times daily.      lisinopril 5 MG tablet   Commonly known as: PRINIVIL,ZESTRIL   Take 5 mg by mouth daily.      multivitamin with minerals Tabs   Take 1 tablet by mouth daily.      propranolol 60 MG tablet   Commonly known as: INDERAL   Take 60 mg by mouth 3 (three) times daily.            LABORATORY STUDIES CBC    Component Value Date/Time   WBC 4.9 11/17/2011 1837   WBC 3.9 11/14/2009 1431   RBC 4.54 11/17/2011 1837   RBC 4.11 11/14/2009 1431   HGB 13.3 11/17/2011 1850   HGB 12.3 11/14/2009 1431   HCT 39.0  11/17/2011 1850   HCT 35.6 11/14/2009 1431   PLT 250 11/17/2011 1837   PLT 257 11/14/2009 1431   MCV 86.6 11/17/2011 1837   MCV 86.6 11/14/2009 1431   MCH 28.6 11/17/2011 1837   MCH 29.8 11/14/2009 1431   MCHC 33.1 11/17/2011 1837   MCHC 34.4 11/14/2009 1431   RDW 14.7 11/17/2011 1837   RDW 14.3 11/14/2009 1431   LYMPHSABS 1.1 11/17/2011 1837   LYMPHSABS 0.8* 11/14/2009 1431   MONOABS 0.5 11/17/2011 1837   MONOABS 0.3 11/14/2009 1431   EOSABS 0.2 11/17/2011 1837   EOSABS 0.1 11/14/2009 1431   BASOSABS 0.0 11/17/2011 1837   BASOSABS 0.0 11/14/2009 1431   CMP    Component Value Date/Time   NA 139 11/17/2011 1850   K 4.1 11/17/2011 1850   CL 105 11/17/2011 1850   CO2 25 11/17/2011 1837   GLUCOSE 121* 11/17/2011 1850   BUN 26* 11/17/2011 1850  CREATININE 1.00 11/17/2011 1850   CALCIUM 10.2 11/17/2011 1837   PROT 7.5 11/17/2011 1837   ALBUMIN 3.8 11/17/2011 1837   AST 30 11/17/2011 1837   ALT 21 11/17/2011 1837   ALKPHOS 43 11/17/2011 1837   BILITOT 0.6 11/17/2011 1837   GFRNONAA 71* 11/17/2011 1837   GFRAA 82* 11/17/2011 1837   COAGS Lab Results  Component Value Date   INR 0.98 11/17/2011   INR 2.7* 02/13/2008   INR 3.2* 02/12/2008   Lipid Panel    Component Value Date/Time   CHOL 148 11/18/2011 0420   TRIG 101 11/18/2011 0420   HDL 34* 11/18/2011 0420   CHOLHDL 4.4 11/18/2011 0420   VLDL 20 11/18/2011 0420   LDLCALC 94 11/18/2011 0420   HgbA1C  Lab Results  Component Value Date   HGBA1C 5.0 11/18/2011   Cardiac Panel (last 3 results)  Basename 11/17/11 1837  CKTOTAL 86  CKMB 3.0  TROPONINI <0.30  RELINDX RELATIVE INDEX IS INVALID   Urinalysis    Component Value Date/Time   COLORURINE YELLOW 02/07/2008 1345   APPEARANCEUR CLEAR 02/07/2008 1345   LABSPEC 1.017 02/07/2008 1345   PHURINE 7.0 02/07/2008 1345   GLUCOSEU NEGATIVE 02/07/2008 1345   HGBUR NEGATIVE 02/07/2008 1345   BILIRUBINUR NEGATIVE 02/07/2008 1345   KETONESUR NEGATIVE 02/07/2008 1345   PROTEINUR 30* 02/07/2008 1345    UROBILINOGEN 1.0 02/07/2008 1345   NITRITE NEGATIVE 02/07/2008 1345   LEUKOCYTESUR MODERATE* 02/07/2008 1345   SIGNIFICANT DIAGNOSTIC STUDIES MRI Head: 11/17/2011 Acute scattered small non hemorrhagic infarcts involving portions of the left opercular region, left frontal lobe and at the junction of the left temporal - parietal lobe.   MRA Head: 11/17/2011 Intracranial atherosclerotic type changes predominantly involving  branch vessels but also the basilar artery    Carotid Doppler 11/18/2011 Right= 40-59% internal carotid artery stenosis, upper end of scale.                                               Left= 40-59% internal carotid artery stenosis, mid to upper end of scale.   TEE 11/19/2011 Dr. Anne Fu Normal EF  No embolic source identified  Mild to mod MR  Mild aortic valve sclerosis  Mild Aortic plaque  Negative bubble study  No LAA thrombus    History of Present Illness   This is a 71 y/o RHWF with significant stroke risk factors who was last seen normal at 4:30 PM  And then she had aphasia followed by right hemiparesis  Initial NIH scale was 5 and when Dr Eilleen Kempf saw her the NIH Scale was 8  After reviewing exclusion and inclusion criteria. tPA was decided   Hospital Course   Patients symptoms resolved with tPA however she experienced lip edema and was treated with benadryl and solumedrol with resolution. She was evaluated by speech, occupational and physical therapy and was felt to not need further treatments. A full stroke workup was preformed as outlined above. Hbg a1c and LDL at goal. Due to this event while on aspirin, the patient will be changed to plavix.  Patient has stroke risk factors of hyperlipidemia and hypertension.   Discharge Exam  Blood pressure 158/57, pulse 76, temperature 98.4 F (36.9 C), temperature source Oral, resp. rate 18, height 5\' 4"  (1.626 m), weight 121.1 kg (266 lb 15.6 oz), SpO2 99.00%.   Physical Exam Pleasant  elderly Caucasian lady not in  distress.Awake alert. Afebrile. Head is nontraumatic. Neck is supple without bruit. Hearing is normal. Cardiac exam no murmur or gallop. Lungs are clear to auscultation. Distal pulses are well felt.  Neurological Exam : Awake Alert oriented x 3. Normal speech and language.Able to name, repeat and comprehend well.Fundi not visualized.Visual acuity and fields seem adequate.eye movements full without nystagmus. Face symmetric. Tongue midline.  Normal strength, tone, reflexes and coordination. Normal sensation. Gait deferred.   Discharge Diet   Cardiac   Discharge Plan   - Disposition:  home - clopidogrel 75 mg orally every day for secondary stroke prevention. - Ongoing risk factor control by Primary Care Physician. - Risk factor recommendations:  Hypertension target range 130-140/70-80     Lipid range - LDL < 100 and checked every 6 months, fasting, weight loss measures. - Follow-up primary md in 1 month. - Follow-up with Dr. Delia Heady in 2 months.  Signed Guy Franco, PAC,  MBA, MHA [facility] Pager: [phone] 11/19/2011 6:21 PM  Scribe for Dr. Delia Heady, Stroke Center Medical Director. He has personally reviewed chart, pertinent data, examined the patient and developed the plan of care. Pager:  [phone]

## 2011-11-19 NOTE — CV Procedure (Signed)
Normal EF No embolic source identified Mild to mod MR Mild aortic valve sclerosis Mild Aortic plaque Negative bubble study No LAA thrombus  Reassuring.

## 2011-11-19 NOTE — Progress Notes (Signed)
Pt dc instructions provided. Pt verbalized understanding. Pt iv dc intact. Pt under no s/s distress. Prescription given to husband.

## 2011-11-19 NOTE — Evaluation (Signed)
Speech Language Pathology Evaluation Patient Details Name: Kristin Hood MRN: [id number] DOB: 05-Aug-1940 Today's Date: 11/19/2011 Time: 1191-4782 SLP Time Calculation (min): 33 min  Problem List:  Patient Active Problem List  Diagnosis  . Angioedema of lips  . Unspecified cerebral artery occlusion with cerebral infarction  . Morbid obesity with BMI of 45.0-49.9, adult   Past Medical History:  Past Medical History  Diagnosis Date  . Cancer   . Hypertension    Past Surgical History:  Past Surgical History  Procedure Date  . Cardiac catheterization   . Mastectomy     left    HPI:  71 yo adm to [facility] with speech disturbance, s/p TPA for left sided CVA.  Pt is s/p PT, OT evaluations without follow up recommended.  Speech evaluation ordered.     Assessment / Plan / Recommendation Clinical Impression  Pt presents with fluent speech and language and intact cognitive linguistic skills.  She demonstrated ability to carry on higher level conversation, recalled medical events and discussed concerns for aphonic people trying to receive emergent medical care.    Reviewed compensations for communication without verbalizing:  eg gestures:  pt discussed using a tablet to communicate as well.  Also reviewed pneumonic to recall a few stroke symptoms and importance of emergent care:   "FAST", pt recalled 3/4 symptoms within 2 minutes.  Spouse and pt agree she is now at her baseline of function.   No SLP follow up needed and both pt and spouse agree.  Thanks.     SLP Assessment  Patient does not need any further Speech Lanaguage Pathology Services    Follow Up Recommendations       Frequency and Duration        Pertinent Vitals/Pain Afebrile, decreased   SLP Goals     SLP Evaluation Prior Functioning  Cognitive/Linguistic Baseline: Within functional limits Type of Home: House Lives With: Spouse Available Help at Discharge: Available 24 hours/day   Cognition  Overall Cognitive  Status: Appears within functional limits for tasks assessed Arousal/Alertness: Awake/alert Orientation Level: Oriented X4 Attention: Alternating Selective Attention: Appears intact Alternating Attention: Appears intact Memory: Appears intact Awareness: Appears intact Problem Solving: Appears intact (concern for aphonic pts being able to call emergent care) Safety/Judgment: Appears intact    Comprehension  Auditory Comprehension Overall Auditory Comprehension: Appears within functional limits for tasks assessed Conversation: Complex Reading Comprehension Reading Status: Not tested    Expression Expression Primary Mode of Expression: Verbal Verbal Expression Overall Verbal Expression: Appears within functional limits for tasks assessed Initiation: No impairment Level of Generative/Spontaneous Verbalization: Conversation Pragmatics: No impairment Written Expression Dominant Hand: Right Written Expression: Not tested   Oral / Motor Oral Motor/Sensory Function Overall Oral Motor/Sensory Function: Appears within functional limits for tasks assessed Motor Speech Overall Motor Speech: Appears within functional limits for tasks assessed (pt with baseline tremorous voice due to essential tremor) Articulation: Within functional limitis Intelligibility: Intelligible Motor Speech Errors: Not applicable Interfering Components: Premorbid status (essential tremor)   GO     Donavan Burnet, MS [facility] SLP [phone]

## 2011-11-19 NOTE — Progress Notes (Signed)
Stroke Team Progress Note  HISTORY This is a 71 y/o RHWF with significant stroke risk factors who was last seen normal at 4:30 PM  And then she had aphasia followed by right hemiparesis  Initial NIH scale was 5 and when Dr Eilleen Kempf saw her the NIH Scale was 8   After reviewing exclusion and inclusion criteria. tPA was decided  Discussed with the husband  Later that evening Dr Thad Ranger was called to see patient due to swelling of the lower lip. On evaluation patient reports that she is better from the tPA. Her speech is fluent. She is able to lift all extremities against gravity and reports some tingling in the fingertips on the right. Bottom lip is swollen. She was given benadryl and solumedrol for lip angioedema and is doing better. BP has been well controlled and her symptoms have resolved.  SUBJECTIVE Her husband is at the bedside.  Overall she feels her condition is almost back to normal. She had a clear liquid breakfast in anticipation of TEE this afternoon. She hopes for discharge later today.  OBJECTIVE Most recent Vital Signs: Filed Vitals:   11/19/11 0600 11/19/11 0700 11/19/11 0800 11/19/11 0900  BP: 141/55 143/56 162/64 151/63  Pulse: 71 74 101 74  Temp:  97.9 F (36.6 C)    TempSrc:  Oral    Resp: 17 17 17 21   Height:      Weight:      SpO2: 96% 96% 96% 96%   CBG (last 3)   Basename 11/17/11 1906  GLUCAP 113*   Intake/Output from previous day: 08/20 0701 - 08/21 0700 In: 1800 [I.V.:1800] Out: 400 [Urine:400]  IV Fluid Intake:     . sodium chloride 75 mL/hr at 11/18/11 2018   MEDICATIONS    . aspirin EC  81 mg Oral Daily  . diphenhydrAMINE  25 mg Intravenous Q8H  . pantoprazole (PROTONIX) IV  40 mg Intravenous QHS   PRN:  acetaminophen, acetaminophen, labetalol, ondansetron (ZOFRAN) IV, senna-docusate  Diet:   Cl liq Activity:  OOB DVT Prophylaxis: SCD  CLINICALLY SIGNIFICANT STUDIES Basic Metabolic Panel:   Lab 11/17/11 1850 11/17/11 1837  NA 139 138   K 4.1 4.1  CL 105 103  CO2 -- 25  GLUCOSE 121* 121*  BUN 26* 24*  CREATININE 1.00 0.81  CALCIUM -- 10.2  MG -- --  PHOS -- --   Liver Function Tests:   Lab 11/17/11 1837  AST 30  ALT 21  ALKPHOS 43  BILITOT 0.6  PROT 7.5  ALBUMIN 3.8   CBC:   Lab 11/17/11 1850 11/17/11 1837  WBC -- 4.9  NEUTROABS -- 3.1  HGB 13.3 13.0  HCT 39.0 39.3  MCV -- 86.6  PLT -- 250   Coagulation:   Lab 11/17/11 1837  LABPROT 13.2  INR 0.98   Cardiac Enzymes:   Lab 11/17/11 1837  CKTOTAL 86  CKMB 3.0  CKMBINDEX --  TROPONINI <0.30   Lipid Panel    Component Value Date/Time   CHOL 148 11/18/2011 0420   TRIG 101 11/18/2011 0420   HDL 34* 11/18/2011 0420   CHOLHDL 4.4 11/18/2011 0420   VLDL 20 11/18/2011 0420   LDLCALC 94 11/18/2011 0420    Ct Head Wo Contrast 11/17/2011 No evidence of acute intracranial abnormality.    MRI of the brain  11/18/2011   Acute scattered small non hemorrhagic infarcts involving portions of the left opercular region, left frontal lobe and at the junction of the left  temporal - parietal lobe.    MRA of the brain  11/18/2011  Intracranial atherosclerotic type changes predominantly involving branch vessels but also the basilar artery as detailed above.   2D Echocardiogram  EF 55-60% with no source of embolus.   Carotid Doppler  Bilateral 40-59% internal carotid artery stenosis bilaterally. Vertebrals with antegrade flow bilaterally.   EKG  normal sinus rhythm, rate 70.  Therapy Recommendations PT - none ; OT - none; ST - none  Physical Exam  Pleasant elderly Caucasian lady not in distress.Awake alert. Afebrile. Head is nontraumatic. Neck is supple without bruit. Hearing is normal. Cardiac exam no murmur or gallop. Lungs are clear to auscultation. Distal pulses are well felt.  Neurological Exam : Awake  Alert oriented x 3. Normal speech and language.Able to name, repeat and comprehend well.Fundi not visualized.Visual acuity and fields seem adequate.eye  movements full without nystagmus. Face symmetric. Tongue midline. Normal strength, tone, reflexes and coordination. Normal sensation. Gait deferred.   ASSESSMENT Kristin Hood is a 71 y.o. female presenting with aphasia and right hemiparesis. Status post IV t-PA full dose at 1918.  Infarct felt to be embolic secondary to cardiac source, workup ongoing.  She had angioedema of the lower lip in reaction to tPA. On aspirin 81 mg orally every day prior to admission. Now on aspirin 81 mg orally every day for secondary stroke prevention.   -hyperlipidemia, LDL 94 on fenofibrate PTA -obesity, Body mass index is 45.83 kg/(m^2).  -arthritis -hypertension -heart murmur (Dr. Anne Fu). Has had a stress test and says that it was abnormal. Had a heart cath and was "fine" per patient -osteoporosis  Hospital day # 2  TREATMENT/PLAN -Change to Plavix for secondary prevention  -TEE today to look for embolic source. Please arrange with pts cardiologist or cardiologist of choice. Will need to be NPO after midnight. If positive for PFO (patent foramen ovale), check bilateral lower extremity venous dopplers to rule out DVT as possible source of stroke.  -Ongoing risk factor control -no therapy needs -anticipate discharge later today  -Dr. Pearlean Brownie d/w patient, husband and answered questions.  Annie Main, MSN, RN, ANVP-BC, ANP-BC, [facility] Pager: [phone] 11/19/2011 9:45 AM  Scribe for Dr. Delia Heady, Stroke Center Medical Director, who has personally reviewed chart, pertinent data, examined the patient and developed the plan of care. Pager:  [phone]

## 2011-11-19 NOTE — Progress Notes (Signed)
TEE at 3pm Wed. Discussed risks and benefits including esophogeal injury. OK for clear breakfast. No prior clinical history of afib from our clinical records.

## 2011-11-19 NOTE — Progress Notes (Signed)
  Echocardiogram Echocardiogram Transesophageal has been performed.  Emelia Loron 11/19/2011, 3:55 PM

## 2011-11-20 ENCOUNTER — Encounter (HOSPITAL_COMMUNITY): Payer: Self-pay | Admitting: Cardiology

## 2011-11-20 ENCOUNTER — Encounter (HOSPITAL_COMMUNITY): Payer: Self-pay

## 2011-11-20 NOTE — Care Management Note (Signed)
    Page 1 of 1   11/20/2011     9:41:01 AM   CARE MANAGEMENT NOTE 11/20/2011  Patient:  Kristin Hood, Kristin Hood   Account Number:  [id number]  Date Initiated:  11/18/2011  Documentation initiated by:  [facility]  Subjective/Objective Assessment:   Admitted with CVA, received tPA.Lives with spouse.     Action/Plan:   PT/OT evals-no PT/OT d/c needs   Anticipated DC Date:  11/21/2011   Anticipated DC Plan:  HOME/SELF CARE      DC Planning Services  CM consult      Choice offered to / List presented to:             Status of service:  Completed, signed off Medicare Important Message given?   (If response is "NO", the following Medicare IM given date fields will be blank) Date Medicare IM given:   Date Additional Medicare IM given:    Discharge Disposition:  HOME/SELF CARE  Per UR Regulation:  Reviewed for med. necessity/level of care/duration of stay  If discussed at Long Length of Stay Meetings, dates discussed:    Comments:

## 2011-11-25 ENCOUNTER — Encounter (HOSPITAL_COMMUNITY): Payer: Self-pay

## 2011-12-09 ENCOUNTER — Other Ambulatory Visit: Payer: Self-pay | Admitting: Family Medicine

## 2011-12-09 DIAGNOSIS — Z9012 Acquired absence of left breast and nipple: Secondary | ICD-10-CM

## 2011-12-09 DIAGNOSIS — Z1231 Encounter for screening mammogram for malignant neoplasm of breast: Secondary | ICD-10-CM

## 2011-12-09 DIAGNOSIS — Z853 Personal history of malignant neoplasm of breast: Secondary | ICD-10-CM

## 2012-01-01 ENCOUNTER — Ambulatory Visit
Admission: RE | Admit: 2012-01-01 | Discharge: 2012-01-01 | Disposition: A | Discharge status: Home / Self Care | Payer: Medicare Other | Source: Ambulatory Visit | Attending: Family Medicine | Admitting: Family Medicine

## 2012-01-01 DIAGNOSIS — Z853 Personal history of malignant neoplasm of breast: Secondary | ICD-10-CM

## 2012-01-01 DIAGNOSIS — Z9012 Acquired absence of left breast and nipple: Secondary | ICD-10-CM

## 2012-01-01 DIAGNOSIS — Z1231 Encounter for screening mammogram for malignant neoplasm of breast: Secondary | ICD-10-CM

## 2012-06-18 ENCOUNTER — Encounter: Payer: Self-pay | Admitting: Neurology

## 2012-06-18 DIAGNOSIS — G252 Other specified forms of tremor: Secondary | ICD-10-CM | POA: Insufficient documentation

## 2012-06-18 DIAGNOSIS — E785 Hyperlipidemia, unspecified: Secondary | ICD-10-CM | POA: Insufficient documentation

## 2012-06-18 DIAGNOSIS — G250 Essential tremor: Secondary | ICD-10-CM

## 2012-06-18 DIAGNOSIS — I10 Essential (primary) hypertension: Secondary | ICD-10-CM

## 2012-06-18 HISTORY — DX: Essential (primary) hypertension: I10

## 2012-06-18 HISTORY — DX: Morbid (severe) obesity due to excess calories: E66.01

## 2012-06-18 HISTORY — DX: Essential tremor: G25.0

## 2012-07-08 ENCOUNTER — Ambulatory Visit (INDEPENDENT_AMBULATORY_CARE_PROVIDER_SITE_OTHER): Payer: Medicare Other | Admitting: Neurology

## 2012-07-08 ENCOUNTER — Encounter: Payer: Self-pay | Admitting: Neurology

## 2012-07-08 DIAGNOSIS — I6529 Occlusion and stenosis of unspecified carotid artery: Secondary | ICD-10-CM

## 2012-07-08 NOTE — Patient Instructions (Signed)
Continue Plavix for secondary stroke prevention and strict control of hypertension with blood pressure goal below 1:30/90 and lipids with LDL cholesterol goal below 100 mg percent. Check followup carotid Doppler studies. I encouraged her to diet, exercise regularly and lose weight. Return for followup in 6 months with nurse practitioner.

## 2012-07-08 NOTE — Progress Notes (Signed)
[facility] [address] [place]. [place] [address] [phone]       OFFICE FOLLOW-UP NOTE  [name] Date of Birth:  1941/01/03 Medical Record Number:  [id number]   HPI: 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 She returns for followup her last visit on 03/12/2012. She continues to do well from the stroke standpoint and has not had any recurrent stroke or TIA symptoms. She is tolerating clopidogrel quite well without significant bleeding or bruising. She states her blood pressure is quite well controlled and today it is 130/80 in our office. She eats healthy and exercises regularly and her weight has been quite stable. She plans to have followup lipid profile checked in her primary physician's office next month. She continues to have left hand and voice tremor intermittently and has been taking Inderal 60 mg 2 tablets in the morning one in the afternoon and 1 he had night with modest benefit. She does have a very strong family history of tremors on her father's side. ROS:   14 system review of systems is positive for runny nose, hand tremor,, voice tremor PMH:  Past Medical History  Diagnosis Date  . Cancer   . Hypertension     Social History:  History   Social History  . Marital Status: Married    Spouse Name: N/A    Number of Children: N/A  . Years of Education: N/A   Occupational History  . Not on file.   Social History Main Topics  . Smoking status: Former Smoker -- 2.50 packs/day for 13 years    Types: Cigarettes    Quit date: 01/30/1992  . Smokeless tobacco: Never Used  . Alcohol Use: 0.5 oz/week    1 drink(s) per week  . Drug Use: No  . Sexually Active: Not on file   Other Topics Concern  .  Not on file   Social History Narrative  . No narrative on file    Medications:   Current Outpatient Prescriptions on File Prior to Visit  Medication Sig Dispense Refill  . atorvastatin (LIPITOR) 10 MG tablet Take 10 mg by mouth at bedtime.       . clopidogrel (PLAVIX) 75 MG tablet Take 1 tablet (75 mg total) by mouth daily.  30 tablet  11  . fenofibrate 160 MG tablet Take 160 mg by mouth at bedtime.       . ferrous sulfate 325 (65 FE) MG tablet Take 325 mg by mouth at bedtime.       . Multiple Vitamin (MULITIVITAMIN WITH MINERALS) TABS Take 1 tablet by mouth daily.      . Omega-3 Fatty Acids (FISH OIL PO) Take 1,800 mg by mouth 2 (two) times daily.       . propranolol (INDERAL) 60 MG tablet Take 60 mg by mouth 4 (four) times daily. 2 in the am one at noon and one in the pm       No current facility-administered medications on file prior to visit.    Allergies:   Allergies  Allergen Reactions  . Floxin (Ofloxacin)     Physical Exam General: well developed, well nourished elderly Caucasian lady, seated, in no evident distress Head: head normocephalic and atraumatic. Orohparynx benign Neck: supple with no carotid or  supraclavicular bruits Cardiovascular: regular rate and rhythm, no murmurs Musculoskeletal: no deformity Skin:  no rash/petichiae Vascular:  Normal pulses all extremities  Neurologic Exam Mental Status: Awake and fully alert. Oriented to place and time. Recent and remote memory intact. Attention span, concentration and fund of knowledge appropriate. Mood and affect appropriate.  Cranial Nerves: Fundoscopic exam reveals sharp disc margins. Pupils equal, briskly reactive to light. Extraocular movements full without nystagmus. Visual fields full to confrontation. Hearing intact. Facial sensation intact. Face, tongue, palate moves normally and symmetrically.  Motor: Normal bulk and tone. Normal strength in all tested extremity muscles. Mild left upper extremity action  tremor and intermittent voice tremor Sensory.: intact to tough and pinprick and vibratory.  Coordination: Rapid alternating movements normal in all extremities. Finger-to-nose and heel-to-shin performed accurately bilaterally. Gait and Station: Arises from chair without difficulty. Stance is normal. Gait demonstrates normal stride length and balance . Able to heel, toe and tandem walk without difficulty.  Reflexes: 1+ and symmetric. Toes downgoing.     ASSESSMENT:  72 year old lady with embolic left MCA branch infarct in August 2013 with vascular risk factors of hypertension and hyperlipidemia. She also has benign essential left hand and voice tremor which has shown modest response to Inderal   PLAN: Continue clopidogrel for secondary stroke prevention this to control of hypertension with blood pressure goal below 130/90 and lipids goal of LDL below 100 mg percent. Check followup carotid ultrasound study. Continue Inderal for her essential tremors. Return for followup with nurse practitioner in 6 months.

## 2012-07-15 ENCOUNTER — Ambulatory Visit (INDEPENDENT_AMBULATORY_CARE_PROVIDER_SITE_OTHER): Payer: Medicare Other

## 2012-07-15 DIAGNOSIS — I6529 Occlusion and stenosis of unspecified carotid artery: Secondary | ICD-10-CM

## 2012-07-29 DIAGNOSIS — I4891 Unspecified atrial fibrillation: Secondary | ICD-10-CM

## 2012-07-29 HISTORY — DX: Unspecified atrial fibrillation: I48.91

## 2012-07-30 ENCOUNTER — Telehealth: Payer: Self-pay | Admitting: Neurology

## 2012-07-30 NOTE — Telephone Encounter (Signed)
I called and spoke to husband and gave him the results of carotid doppler (negative for significantly plaque seen).  He verbalized understanding.

## 2012-08-20 DIAGNOSIS — I499 Cardiac arrhythmia, unspecified: Secondary | ICD-10-CM

## 2012-08-20 HISTORY — DX: Cardiac arrhythmia, unspecified: I49.9

## 2012-11-03 ENCOUNTER — Other Ambulatory Visit: Payer: Self-pay

## 2012-12-10 ENCOUNTER — Emergency Department (INDEPENDENT_AMBULATORY_CARE_PROVIDER_SITE_OTHER)
Admission: EM | Admit: 2012-12-10 | Discharge: 2012-12-10 | Disposition: A | Discharge status: Nursing Home (Includes ICF) | Payer: Medicare Other | Source: Home / Self Care | Attending: Family Medicine | Admitting: Family Medicine

## 2012-12-10 ENCOUNTER — Encounter (HOSPITAL_COMMUNITY): Payer: Self-pay | Admitting: Emergency Medicine

## 2012-12-10 ENCOUNTER — Emergency Department (HOSPITAL_COMMUNITY): Payer: Medicare Other

## 2012-12-10 ENCOUNTER — Observation Stay (HOSPITAL_COMMUNITY)
Admission: EM | Admit: 2012-12-10 | Discharge: 2012-12-11 | Disposition: A | Discharge status: Home / Self Care | Payer: Medicare Other | Attending: Family Medicine | Admitting: Family Medicine

## 2012-12-10 DIAGNOSIS — I10 Essential (primary) hypertension: Secondary | ICD-10-CM | POA: Insufficient documentation

## 2012-12-10 DIAGNOSIS — R079 Chest pain, unspecified: Secondary | ICD-10-CM

## 2012-12-10 DIAGNOSIS — Z8673 Personal history of transient ischemic attack (TIA), and cerebral infarction without residual deficits: Secondary | ICD-10-CM | POA: Insufficient documentation

## 2012-12-10 DIAGNOSIS — I4891 Unspecified atrial fibrillation: Secondary | ICD-10-CM | POA: Insufficient documentation

## 2012-12-10 DIAGNOSIS — Z79899 Other long term (current) drug therapy: Secondary | ICD-10-CM | POA: Insufficient documentation

## 2012-12-10 DIAGNOSIS — E119 Type 2 diabetes mellitus without complications: Secondary | ICD-10-CM | POA: Insufficient documentation

## 2012-12-10 DIAGNOSIS — Z9010 Acquired absence of unspecified breast and nipple: Secondary | ICD-10-CM | POA: Insufficient documentation

## 2012-12-10 DIAGNOSIS — E785 Hyperlipidemia, unspecified: Secondary | ICD-10-CM | POA: Insufficient documentation

## 2012-12-10 DIAGNOSIS — Z853 Personal history of malignant neoplasm of breast: Secondary | ICD-10-CM | POA: Insufficient documentation

## 2012-12-10 HISTORY — DX: Unspecified atrial fibrillation: I48.91

## 2012-12-10 HISTORY — DX: Zoster without complications: B02.9

## 2012-12-10 HISTORY — DX: Cerebral infarction, unspecified: I63.9

## 2012-12-10 LAB — CBC
Hemoglobin: 13.2 g/dL (ref 12.0–15.0)
MCH: 29.2 pg (ref 26.0–34.0)
MCHC: 34.4 g/dL (ref 30.0–36.0)
MCV: 85 fL (ref 78.0–100.0)

## 2012-12-10 LAB — BASIC METABOLIC PANEL
Calcium: 10.2 mg/dL (ref 8.4–10.5)
Creatinine, Ser: 1.25 mg/dL — ABNORMAL HIGH (ref 0.50–1.10)
GFR calc non Af Amer: 42 mL/min — ABNORMAL LOW (ref >90)
Glucose, Bld: 117 mg/dL — ABNORMAL HIGH (ref 70–99)
Sodium: 138 mEq/L (ref 135–145)

## 2012-12-10 LAB — POCT I-STAT TROPONIN I: Troponin i, poc: 0 ng/mL (ref 0.00–0.08)

## 2012-12-10 MED ORDER — ASPIRIN 325 MG PO TABS
325.0000 mg | ORAL_TABLET | Freq: Once | ORAL | Status: AC
  Administered 2012-12-10: 325 mg via ORAL
  Filled 2012-12-10: qty 1

## 2012-12-10 NOTE — ED Notes (Signed)
Patient with chest pain that started at 1830 tonight.  Patient states she is pain free at this time.  Patient states she was seen at [facility] and sent to ED for further eval.  Patient is CAOx3.  She states she is feeling queezy and nauseated.

## 2012-12-10 NOTE — ED Notes (Signed)
Attempted to call report, Nurse that will be taking pt was in huddle. Will call back when done.

## 2012-12-10 NOTE — ED Notes (Signed)
Patient took blood pressure at home and reports high readings.  Patient's spouse reports she has had chest pain today.

## 2012-12-10 NOTE — ED Notes (Signed)
Pt states she was playing with a puppy when she began having some constant left sided chest pain described as sharp in nature. Pt denies any sob, nausea, vomiting but does states she had some lightheadedness and dizziness. Pt states she had one brief episode of chest pain on Tuesday but states it went away quickly. Pt denies any pain right now.

## 2012-12-10 NOTE — ED Provider Notes (Signed)
Kristin Hood is a 72 y.o. female who presents to Urgent Care today for chest pain. Patient had an episode of central chest pain starting today. Started about 6:30 this evening. She is no longer having chest pain. It was not associated with palpitations. She took her blood pressure around that time she had her chest pain episode and was 180/100. She notes some shortness of breath associated with this chest pain episode. She is the extensive pertinent past medical history for atrial fibrillation diagnosed in May 2014, a left-sided stroke diagnosed in August of 2013.  She is additionally 14 years status post breast cancer treatment.  Her most recent catheterization was in May of 2013 which was normal. She did not take aspirin. She did not take nitroglycerin. She is currently taking Eliquis for atrial fib prophylaxis   Past Medical History  Diagnosis Date  . Cancer   . Hypertension   . Stroke   . Shingles   . Atrial fibrillation    Heart catheter May 2013:  1. No angiographically significant CAD 2. Normal left ventricular systolic function. LVEDP 14 mmHg. Ejection fraction 65% by ECHO.  History  Substance Use Topics  . Smoking status: Former Smoker -- 2.50 packs/day for 13 years    Types: Cigarettes    Quit date: 01/30/1992  . Smokeless tobacco: Never Used  . Alcohol Use: 0.5 oz/week    1 drink(s) per week   ROS as above Medications reviewed. No current facility-administered medications for this encounter.   Current Outpatient Prescriptions  Medication Sig Dispense Refill  . atorvastatin (LIPITOR) 10 MG tablet Take 10 mg by mouth at bedtime.       . fenofibrate 160 MG tablet Take 160 mg by mouth at bedtime.       . ferrous sulfate 325 (65 FE) MG tablet Take 325 mg by mouth at bedtime.       . Multiple Vitamin (MULITIVITAMIN WITH MINERALS) TABS Take 1 tablet by mouth daily.      . Omega-3 Fatty Acids (FISH OIL PO) Take 1,800 mg by mouth 2 (two) times daily.       . propranolol  (INDERAL) 60 MG tablet Take 60 mg by mouth 4 (four) times daily. 2 in the am one at noon and one in the pm        Exam:  BP 138/64  Pulse 85  Temp(Src) 97.6 F (36.4 C) (Oral)  Resp 16  SpO2 97% Gen: Well NAD, obese HEENT: EOMI,  MMM Lungs: CTABL Nl WOB Heart: RRR no MRG Abd: NABS, NT, ND Exts: Non edematous BL  LE, warm and well perfused.   Twelve-lead EKG shows atrial regularly irregular at 87 beats per minute. No other abnormalities noted. No results found for this or any previous visit (from the past 24 hour(s)). No results found.  Assessment and Plan: 72 y.o. female with chest pain.  Patient has an extensive concerning past medical history.  She has had a stroke, and has relatively new atrial fibrillation.  Her EKG is relatively unchanged, however her risk factors are high. Plan to transfer to the emergency room for further evaluation and management.  Patient refuses to go via EMS.  We'll transfer via shuttle       Rodolph Bong, MD 12/10/12 2022

## 2012-12-10 NOTE — ED Provider Notes (Signed)
CSN: [id number]     Arrival date & time 12/10/12  2026 History   First MD Initiated Contact with Patient 12/10/12 2049     Chief Complaint  Patient presents with  . Chest Pain   (Consider location/radiation/quality/duration/timing/severity/associated sxs/prior Treatment) HPI Comments: Patient is a 72 year old female With history of cancer, Hypertension, stroke, A. fib who presents today with chest pain earlier today. She reports that the chest pain was sharp. Initially it began after eating chicken teriyaki. Patient took her BP and noted it was 180/100. The pain lasted approximately 40 minutes and resolved spontaneously. She currently feels well. She reports that she also had this pain on Tuesday.  This pain was not associated with diaphoresis or shortness of breath. She works out at [employer] and recently increased the weight she was using for her tricep and bicep exercises. Initially she blamed her pain on that. She does note that she has been having worsening dyspnea on exertion and now can only walk a block without getting winded. She has night sweats and reports it is more comfortable to sleep in her recliner. Her PCP is Caryn Bee Little. Her cardiologist is with [facility] cardiology.  The history is provided by the patient. No language interpreter was used.    Past Medical History  Diagnosis Date  . Cancer   . Hypertension   . Stroke   . Shingles   . Atrial fibrillation    Past Surgical History  Procedure Laterality Date  . Cardiac catheterization    . Mastectomy      left   . Tee without cardioversion  11/19/2011    Procedure: TRANSESOPHAGEAL ECHOCARDIOGRAM (TEE);  Surgeon: Donato Schultz, MD;  Location: [facility] ENDOSCOPY;  Service: Cardiovascular;  Laterality: N/A;  . Knee surgery    . Bunionectomy     History reviewed. No pertinent family history. History  Substance Use Topics  . Smoking status: Former Smoker -- 2.50 packs/day for 13 years    Types: Cigarettes    Quit date: 01/30/1992  .  Smokeless tobacco: Never Used  . Alcohol Use: 0.5 oz/week    1 drink(s) per week   OB History   Grav Para Term Preterm Abortions TAB SAB Ect Mult Living                 Review of Systems  Constitutional: Negative for fever, chills and diaphoresis.  Respiratory: Positive for shortness of breath.   Cardiovascular: Positive for chest pain.  All other systems reviewed and are negative.    Allergies  Floxin  Home Medications   Current Outpatient Rx  Name  Route  Sig  Dispense  Refill  . Apixaban (ELIQUIS PO)   Oral   Take by mouth.         Marland Kitchen atorvastatin (LIPITOR) 10 MG tablet   Oral   Take 10 mg by mouth at bedtime.          . fenofibrate 160 MG tablet   Oral   Take 160 mg by mouth at bedtime.          . ferrous sulfate 325 (65 FE) MG tablet   Oral   Take 325 mg by mouth at bedtime.          . Multiple Vitamin (MULITIVITAMIN WITH MINERALS) TABS   Oral   Take 1 tablet by mouth daily.         . Omega-3 Fatty Acids (FISH OIL PO)   Oral   Take 1,800 mg by mouth  2 (two) times daily.          . propranolol (INDERAL) 60 MG tablet   Oral   Take 60 mg by mouth 4 (four) times daily. 2 in the am one at noon and one in the pm          BP 150/106  Pulse 84  Temp(Src) 98.3 F (36.8 C) (Oral)  Resp 16  Ht 5\' 6"  (1.676 m)  Wt 262 lb 3.2 oz (118.933 kg)  BMI 42.34 kg/m2  SpO2 96% Physical Exam  Nursing note and vitals reviewed. Constitutional: She is oriented to person, place, and time. She appears well-developed and well-nourished. No distress.  HENT:  Head: Normocephalic and atraumatic.  Right Ear: External ear normal.  Left Ear: External ear normal.  Nose: Nose normal.  Mouth/Throat: Oropharynx is clear and moist.  Eyes: Conjunctivae are normal.  Neck: Normal range of motion.  Cardiovascular: Normal rate, regular rhythm and normal heart sounds.   Pulmonary/Chest: Effort normal and breath sounds normal. No stridor. No respiratory distress. She has  no wheezes. She has no rales.  Abdominal: Soft. She exhibits no distension.  Musculoskeletal: Normal range of motion.  Neurological: She is alert and oriented to person, place, and time. She has normal strength.  Essential tremor  Skin: Skin is warm and dry. She is not diaphoretic. No erythema.  Psychiatric: She has a normal mood and affect. Her behavior is normal.    ED Course  Procedures (including critical care time) Labs Review Labs Reviewed  BASIC METABOLIC PANEL - Abnormal; Notable for the following:    Glucose, Bld 117 (*)    Creatinine, Ser 1.25 (*)    GFR calc non Af Amer 42 (*)    GFR calc Af Amer 48 (*)    All other components within normal limits  CBC  POCT I-STAT TROPONIN I    Date: 12/10/2012  Rate: 79  Rhythm: atrial fibrillation  QRS Axis: normal  Intervals: normal  ST/T Wave abnormalities: nonspecific T wave changes  Conduction Disutrbances:none  Narrative Interpretation:   Old EKG Reviewed: unchanged    Imaging Review Dg Chest 2 View  12/10/2012   CLINICAL DATA:  72 year old female chest pain.  EXAM: CHEST  2 VIEW  COMPARISON:  06/30/2009.  FINDINGS: Increased cardiomegaly. Other mediastinal contours are within normal limits. Visualized tracheal air column is within normal limits. No pneumothorax or pleural effusion. No pulmonary edema or confluent pulmonary opacity. Mild diffuse increased interstitial markings. No acute osseous abnormality identified.  IMPRESSION: Progressed cardiomegaly since 2011. No other acute cardiopulmonary abnormality.   Electronically Signed   By: Augusto Gamble M.D.   On: 12/10/2012 21:22    MDM  No diagnosis found.  Concern for cardiac etiology of Chest Pain. Pt has been re-evaluated prior to consult and VSS, NAD, heart RRR, pain 0/10, lungs CTAB. No acute abnormalities found on EKG and first round of cardiac enzymes negative. This case was discussed with Dr. Gwendolyn Grant who has seen the patient and agrees with plan to admit. Admission  appreciated.     Mora Bellman, PA-C 12/11/12 [phone]

## 2012-12-10 NOTE — ED Notes (Signed)
Left sided chest pain this PM w nausea , no vomiting

## 2012-12-11 ENCOUNTER — Encounter (HOSPITAL_COMMUNITY): Payer: Self-pay | Admitting: Internal Medicine

## 2012-12-11 DIAGNOSIS — Z8673 Personal history of transient ischemic attack (TIA), and cerebral infarction without residual deficits: Secondary | ICD-10-CM

## 2012-12-11 DIAGNOSIS — R079 Chest pain, unspecified: Secondary | ICD-10-CM

## 2012-12-11 DIAGNOSIS — I4891 Unspecified atrial fibrillation: Secondary | ICD-10-CM

## 2012-12-11 HISTORY — DX: Personal history of transient ischemic attack (TIA), and cerebral infarction without residual deficits: Z86.73

## 2012-12-11 HISTORY — DX: Chest pain, unspecified: R07.9

## 2012-12-11 LAB — CBC
HCT: 37.2 % (ref 36.0–46.0)
MCHC: 32.5 g/dL (ref 30.0–36.0)
Platelets: 198 10*3/uL (ref 150–400)
RDW: 14.8 % (ref 11.5–15.5)
WBC: 3.5 10*3/uL — ABNORMAL LOW (ref 4.0–10.5)

## 2012-12-11 LAB — BASIC METABOLIC PANEL
BUN: 21 mg/dL (ref 6–23)
Calcium: 9.5 mg/dL (ref 8.4–10.5)
Creatinine, Ser: 0.88 mg/dL (ref 0.50–1.10)
GFR calc Af Amer: 74 mL/min — ABNORMAL LOW (ref >90)
GFR calc non Af Amer: 64 mL/min — ABNORMAL LOW (ref >90)

## 2012-12-11 LAB — TROPONIN I: Troponin I: <0.3 ng/mL (ref <0.30)

## 2012-12-11 MED ORDER — FERROUS SULFATE 325 (65 FE) MG PO TABS
325.0000 mg | ORAL_TABLET | Freq: Every day | ORAL | Status: DC
  Administered 2012-12-11: 325 mg via ORAL
  Filled 2012-12-11: qty 1

## 2012-12-11 MED ORDER — SODIUM CHLORIDE 0.9 % IJ SOLN
3.0000 mL | Freq: Two times a day (BID) | INTRAMUSCULAR | Status: DC
  Administered 2012-12-11: 3 mL via INTRAVENOUS

## 2012-12-11 MED ORDER — PROPRANOLOL HCL 60 MG PO TABS
120.0000 mg | ORAL_TABLET | Freq: Two times a day (BID) | ORAL | Status: DC
  Administered 2012-12-11 (×2): 120 mg via ORAL
  Filled 2012-12-11 (×3): qty 2

## 2012-12-11 MED ORDER — ACETAMINOPHEN 650 MG RE SUPP: 650.0000 mg | Freq: Four times a day (QID) | RECTAL | Status: DC | PRN

## 2012-12-11 MED ORDER — ONDANSETRON HCL 4 MG PO TABS: 4.0000 mg | ORAL_TABLET | Freq: Four times a day (QID) | ORAL | Status: DC | PRN

## 2012-12-11 MED ORDER — ASPIRIN EC 325 MG PO TBEC
325.0000 mg | DELAYED_RELEASE_TABLET | Freq: Every day | ORAL | Status: DC
  Administered 2012-12-11: 325 mg via ORAL
  Filled 2012-12-11: qty 1

## 2012-12-11 MED ORDER — ACETAMINOPHEN 325 MG PO TABS: 650.0000 mg | ORAL_TABLET | Freq: Four times a day (QID) | ORAL | Status: DC | PRN

## 2012-12-11 MED ORDER — ATORVASTATIN CALCIUM 10 MG PO TABS
10.0000 mg | ORAL_TABLET | Freq: Every day | ORAL | Status: DC
  Administered 2012-12-11: 10 mg via ORAL
  Filled 2012-12-11: qty 1

## 2012-12-11 MED ORDER — FENOFIBRATE 160 MG PO TABS
160.0000 mg | ORAL_TABLET | Freq: Every day | ORAL | Status: DC
  Administered 2012-12-11: 160 mg via ORAL
  Filled 2012-12-11: qty 1

## 2012-12-11 MED ORDER — APIXABAN 5 MG PO TABS
5.0000 mg | ORAL_TABLET | Freq: Two times a day (BID) | ORAL | Status: DC
  Administered 2012-12-11 (×2): 5 mg via ORAL
  Filled 2012-12-11 (×3): qty 1

## 2012-12-11 MED ORDER — ONDANSETRON HCL 4 MG/2ML IJ SOLN: 4.0000 mg | Freq: Four times a day (QID) | INTRAMUSCULAR | Status: DC | PRN

## 2012-12-11 MED ORDER — OMEGA-3-ACID ETHYL ESTERS 1 G PO CAPS
1.0000 g | ORAL_CAPSULE | Freq: Two times a day (BID) | ORAL | Status: DC
  Administered 2012-12-11: 1 g via ORAL
  Filled 2012-12-11 (×2): qty 1

## 2012-12-11 MED ORDER — SODIUM CHLORIDE 0.9 % IJ SOLN
3.0000 mL | Freq: Two times a day (BID) | INTRAMUSCULAR | Status: DC
  Administered 2012-12-11 (×2): 3 mL via INTRAVENOUS

## 2012-12-11 NOTE — H&P (Signed)
[facility] History and Physical  Kristin Hood WUJ:[id number] DOB: 10-02-1940 DOA: 12/10/2012  Referring physician: ER physician. PCP: Mickie Hillier, MD  Specialists: Dr. Anne Fu. Cardiologist.  Chief Complaint: Chest pain.  HPI: Kristin Hood is a 72 y.o. female history of atrial fibrillation on apixaban, previous CVA, breast cancer status post mastectomy, hypertension presented to the year because of chest pain. Patient had left-sided chest pain last evening while at home. The pain was pressure-like nonradiating and also had a stabbing quality. Denies any associated nausea vomiting shortness of breath diaphoresis fever chills or cough. Patient's chest pain resolved at home and presently chest pain-free. Patient's cardiac markers are negative and EKG shows T-wave inversions in anterior leads which are present in the old EKG. Patient has been admitted for further management. Presently patient is chest pain-free.  Review of Systems: As presented in the history of presenting illness, rest negative.  Past Medical History  Diagnosis Date  . Cancer   . Hypertension   . Stroke   . Shingles   . Atrial fibrillation    Past Surgical History  Procedure Laterality Date  . Cardiac catheterization    . Mastectomy      left   . Tee without cardioversion  11/19/2011    Procedure: TRANSESOPHAGEAL ECHOCARDIOGRAM (TEE);  Surgeon: Donato Schultz, MD;  Location[facility] ENDOSCOPY;  Service: Cardiovascular;  Laterality: N/A;  . Knee surgery    . Bunionectomy     Social History:  reports that she quit smoking about 20 years ago. Her smoking use included Cigarettes. She has a 32.5 pack-year smoking history. She has never used smokeless tobacco. She reports that she drinks about 0.5 ounces of alcohol per week. She reports that she does not use illicit drugs. Home. where does patient live-- Can do ADLs. Can patient participate in ADLs?  Allergies  Allergen Reactions  . Floxin [Ofloxacin] Other  (See Comments)    Reaction unspecified    History reviewed. No pertinent family history.    Prior to Admission medications   Medication Sig Start Date End Date Taking? Authorizing Provider  apixaban (ELIQUIS) 5 MG TABS tablet Take 5 mg by mouth 2 (two) times daily.   Yes Historical Provider, MD  atorvastatin (LIPITOR) 10 MG tablet Take 10 mg by mouth daily.    Yes Historical Provider, MD  fenofibrate 160 MG tablet Take 160 mg by mouth daily.    Yes Historical Provider, MD  ferrous sulfate 325 (65 FE) MG tablet Take 325 mg by mouth daily.    Yes Historical Provider, MD  Omega-3 Fatty Acids (FISH OIL PO) Take 1,800 mg by mouth 2 (two) times daily.    Yes Historical Provider, MD  propranolol (INDERAL) 60 MG tablet Take 120 mg by mouth 2 (two) times daily.    Yes Historical Provider, MD   Physical Exam: Filed Vitals:   12/10/12 2200 12/10/12 2230 12/10/12 2330 12/11/12 0000  BP: 137/41 126/46 138/56 141/73  Pulse: 88 89 86 88  Temp:    98 F (36.7 C)  TempSrc:    Oral  Resp:   22 22  Height:      Weight:      SpO2: 97% 97% 95% 97%     General:  Well-developed and nourished.  Eyes: Anicteric no pallor.  ENT: No discharge from ears eyes nose mouth.  Neck: No mass felt.  Cardiovascular: S1-S2 heard.  Respiratory: No rhonchi or crepitations.  Abdomen: Soft nontender bowel sounds present.  Skin: No rash.  Musculoskeletal: No edema.  Psychiatric: Appears normal.  Neurologic: Alert awake oriented to time place and person. Moves all extremities.  Labs on Admission:  Basic Metabolic Panel:  Recent Labs Lab 12/10/12 2035  NA 138  K 4.5  CL 101  CO2 25  GLUCOSE 117*  BUN 20  CREATININE 1.25*  CALCIUM 10.2   Liver Function Tests: No results found for this basename: AST, ALT, ALKPHOS, BILITOT, PROT, ALBUMIN,  in the last 168 hours No results found for this basename: LIPASE, AMYLASE,  in the last 168 hours No results found for this basename: AMMONIA,  in the last  168 hours CBC:  Recent Labs Lab 12/10/12 2035  WBC 4.6  HGB 13.2  HCT 38.4  MCV 85.0  PLT 247   Cardiac Enzymes: No results found for this basename: CKTOTAL, CKMB, CKMBINDEX, TROPONINI,  in the last 168 hours  BNP (last 3 results) No results found for this basename: PROBNP,  in the last 8760 hours CBG: No results found for this basename: GLUCAP,  in the last 168 hours  Radiological Exams on Admission: Dg Chest 2 View  12/10/2012   CLINICAL DATA:  72 year old female chest pain.  EXAM: CHEST  2 VIEW  COMPARISON:  06/30/2009.  FINDINGS: Increased cardiomegaly. Other mediastinal contours are within normal limits. Visualized tracheal air column is within normal limits. No pneumothorax or pleural effusion. No pulmonary edema or confluent pulmonary opacity. Mild diffuse increased interstitial markings. No acute osseous abnormality identified.  IMPRESSION: Progressed cardiomegaly since 2011. No other acute cardiopulmonary abnormality.   Electronically Signed   By: Augusto Gamble M.D.   On: 12/10/2012 21:22    EKG: Independently reviewed. Atrial fibrillation with T wave inversions in the anterior leads comparable to the old EKG. Rate controlled.  Assessment/Plan Principal Problem:   Chest pain Active Problems:   Other and unspecified hyperlipidemia   Atrial fibrillation   History of CVA (cerebrovascular accident)   1. Chest pain - patient has had a cardiac catheter last year which was unremarkable. At this time patient is chest pain-free. Cycle cardiac markers. Monitor in telemetry. Aspirin. 2. Atrial fibrillation - rate controlled. Continue apixaban. 3. History of CVA - continue apixaban. 4. Hypertension - continue home medications. 5. Mild elevation of creatinine - closely follow intake output and metabolic panel. 6. History of breast cancer status post mastectomy and chemotherapy.    Code Status: Full code.  Family Communication: None.  Disposition Plan: Admit for observation.     [name] N. [facility] Pager [phone].  If 7PM-7AM, please contact night-coverage [email or web address] Password [email or web address] 12/11/2012, 12:12 AM

## 2012-12-11 NOTE — ED Provider Notes (Signed)
Medical screening examination/treatment/procedure(s) were conducted as a shared visit with non-physician practitioner(s) and myself.  I personally evaluated the patient during the encounter  49F with hx of DM, CVA presents with CP. Two episodes this week, one 3 days ago, one tonight. Central pressure, no alleviating/exacerbating factors. CP free on arrival. EKG normal. Initial labs normal. Patient admitted to ACS rule-out.  Dagmar Hait, MD 12/11/12 [phone]

## 2012-12-11 NOTE — Discharge Summary (Signed)
Physician Discharge Summary  Kristin Hood QIO:[id number] DOB: January 20, 1941 DOA: 12/10/2012  PCP: Mickie Hillier, MD  Admit date: 12/10/2012 Discharge date: 12/11/2012  Time spent: 35 minutes  Recommendations for Outpatient Follow-up:  1. Needs to discuss with Dr. Anne Fu further Cardiac w/u.  Will forward this note to him  2. Recommend discussion with sports medicine/primary care physician if muscle skeletal pain recurs  Discharge Diagnoses:  Principal Problem:   Chest pain Active Problems:   Other and unspecified hyperlipidemia   Atrial fibrillation   History of CVA (cerebrovascular accident)   Discharge Condition: Good  Diet recommendation: Heart healthy  Filed Weights   12/10/12 2035  Weight: 118.933 kg (262 lb 3.2 oz)    History of present illness:  58 female known history left-sided breast cancer with mastectomy 2001 briefly treated with tamoxifen/other medications, new onset A. fib about one year ago, history CAD status post nuclear stress test 2 years ago which was normal was admitted overnight with chest pain She ruled out by cardiac enzymes x3. She has no further chest pain She relates that she felt was either gas or masses felt nature and was and that lasted only minutes in her chest. It is unlikely vs. cardiogenic however I will forward this note to Dr. Anne Fu to ensure he is in the low regarding her chest pain history-patient states clearly she has an appointment 12/31/2012 which may need to be moved up for further consideration In her specific case I would consider adding aspirin 81 mg and platelet activity-she has had a history of stroke however I will defer this discussion to her cardiologist and primary care physician    Discharge Exam: Filed Vitals:   12/11/12 1432  BP: 120/59  Pulse: 70  Temp: 97.6 F (36.4 C)  Resp: 20    General: Alert and oriented no apparent distress Cardiovascular: Mr. no murmur rub or gallop sinus rhythm  Respiratory:  Clinically clear  Discharge Instructions  Discharge Orders   Future Appointments Provider Department Dept Phone   12/31/2012 9:45 AM Donato Schultz, MD [facility] [phone]   Future Orders Complete By Expires   Diet - low sodium heart healthy  As directed    Discharge instructions  As directed    Comments:     You were cared for by a hospitalist during your hospital stay. If you have any questions about your discharge medications or the care you received while you were in the hospital after you are discharged, you can call the unit and asked to speak with the hospitalist on call if the hospitalist that took care of you is not available. Once you are discharged, your primary care physician will handle any further medical issues. Please note that NO REFILLS for any discharge medications will be authorized once you are discharged, as it is imperative that you return to your primary care physician (or establish a relationship with a primary care physician if you do not have one) for your aftercare needs so that they can reassess your need for medications and monitor your lab values. If you do not have a primary care physician, you can call [phone] for a physician referral.   Increase activity slowly  As directed        Medication List         apixaban 5 MG Tabs tablet  Commonly known as:  ELIQUIS  Take 5 mg by mouth 2 (two) times daily.     atorvastatin 10 MG tablet  Commonly  known as:  LIPITOR  Take 10 mg by mouth daily.     fenofibrate 160 MG tablet  Take 160 mg by mouth daily.     ferrous sulfate 325 (65 FE) MG tablet  Take 325 mg by mouth daily.     FISH OIL PO  Take 1,800 mg by mouth 2 (two) times daily.     propranolol 60 MG tablet  Commonly known as:  INDERAL  Take 120 mg by mouth 2 (two) times daily.       Allergies  Allergen Reactions  . Floxin [Ofloxacin] Other (See Comments)    Reaction unspecified      The results of significant diagnostics  from this hospitalization (including imaging, microbiology, ancillary and laboratory) are listed below for reference.    Significant Diagnostic Studies: Dg Chest 2 View  12/10/2012   CLINICAL DATA:  72 year old female chest pain.  EXAM: CHEST  2 VIEW  COMPARISON:  06/30/2009.  FINDINGS: Increased cardiomegaly. Other mediastinal contours are within normal limits. Visualized tracheal air column is within normal limits. No pneumothorax or pleural effusion. No pulmonary edema or confluent pulmonary opacity. Mild diffuse increased interstitial markings. No acute osseous abnormality identified.  IMPRESSION: Progressed cardiomegaly since 2011. No other acute cardiopulmonary abnormality.   Electronically Signed   By: Augusto Gamble M.D.   On: 12/10/2012 21:22    Microbiology: No results found for this or any previous visit (from the past 240 hour(s)).   Labs: Basic Metabolic Panel:  Recent Labs Lab 12/10/12 2035 12/11/12 0515  NA 138 137  K 4.5 3.9  CL 101 102  CO2 25 23  GLUCOSE 117* 98  BUN 20 21  CREATININE 1.25* 0.88  CALCIUM 10.2 9.5   Liver Function Tests: No results found for this basename: AST, ALT, ALKPHOS, BILITOT, PROT, ALBUMIN,  in the last 168 hours No results found for this basename: LIPASE, AMYLASE,  in the last 168 hours No results found for this basename: AMMONIA,  in the last 168 hours CBC:  Recent Labs Lab 12/10/12 2035 12/11/12 0515  WBC 4.6 3.5*  HGB 13.2 12.1  HCT 38.4 37.2  MCV 85.0 86.1  PLT 247 198   Cardiac Enzymes:  Recent Labs Lab 12/11/12 0144 12/11/12 0640 12/11/12 1315  TROPONINI <0.30 <0.30 <0.30   BNP: BNP (last 3 results) No results found for this basename: PROBNP,  in the last 8760 hours CBG: No results found for this basename: GLUCAP,  in the last 168 hours     Signed:  Rhetta Mura  [facility] 12/11/2012, 3:49 PM

## 2012-12-13 ENCOUNTER — Other Ambulatory Visit: Payer: Self-pay

## 2012-12-13 DIAGNOSIS — Z1231 Encounter for screening mammogram for malignant neoplasm of breast: Secondary | ICD-10-CM

## 2012-12-13 DIAGNOSIS — Z9012 Acquired absence of left breast and nipple: Secondary | ICD-10-CM

## 2012-12-25 ENCOUNTER — Encounter: Payer: Self-pay | Admitting: Cardiology

## 2012-12-25 DIAGNOSIS — E7800 Pure hypercholesterolemia, unspecified: Secondary | ICD-10-CM

## 2012-12-25 DIAGNOSIS — I8000 Phlebitis and thrombophlebitis of superficial vessels of unspecified lower extremity: Secondary | ICD-10-CM

## 2012-12-25 DIAGNOSIS — E782 Mixed hyperlipidemia: Secondary | ICD-10-CM

## 2012-12-25 DIAGNOSIS — Z7901 Long term (current) use of anticoagulants: Secondary | ICD-10-CM

## 2012-12-25 DIAGNOSIS — I119 Hypertensive heart disease without heart failure: Secondary | ICD-10-CM

## 2012-12-25 DIAGNOSIS — R259 Unspecified abnormal involuntary movements: Secondary | ICD-10-CM

## 2012-12-25 DIAGNOSIS — R7301 Impaired fasting glucose: Secondary | ICD-10-CM

## 2012-12-25 DIAGNOSIS — I10 Essential (primary) hypertension: Secondary | ICD-10-CM

## 2012-12-25 HISTORY — DX: Mixed hyperlipidemia: E78.2

## 2012-12-25 HISTORY — DX: Phlebitis and thrombophlebitis of superficial vessels of unspecified lower extremity: I80.00

## 2012-12-25 HISTORY — DX: Hypertensive heart disease without heart failure: I11.9

## 2012-12-25 HISTORY — DX: Long term (current) use of anticoagulants: Z79.01

## 2012-12-25 HISTORY — DX: Pure hypercholesterolemia, unspecified: E78.00

## 2012-12-31 ENCOUNTER — Ambulatory Visit (INDEPENDENT_AMBULATORY_CARE_PROVIDER_SITE_OTHER): Payer: Medicare Other | Admitting: Cardiology

## 2012-12-31 ENCOUNTER — Encounter: Payer: Self-pay | Admitting: Cardiology

## 2012-12-31 VITALS — BP 144/80 | HR 80 | Ht 66.0 in | Wt 260.0 lb

## 2012-12-31 DIAGNOSIS — Z8673 Personal history of transient ischemic attack (TIA), and cerebral infarction without residual deficits: Secondary | ICD-10-CM

## 2012-12-31 DIAGNOSIS — E785 Hyperlipidemia, unspecified: Secondary | ICD-10-CM

## 2012-12-31 DIAGNOSIS — Z6841 Body Mass Index (BMI) 40.0 and over, adult: Secondary | ICD-10-CM

## 2012-12-31 DIAGNOSIS — R079 Chest pain, unspecified: Secondary | ICD-10-CM

## 2012-12-31 DIAGNOSIS — I10 Essential (primary) hypertension: Secondary | ICD-10-CM

## 2012-12-31 DIAGNOSIS — Z7901 Long term (current) use of anticoagulants: Secondary | ICD-10-CM

## 2012-12-31 DIAGNOSIS — I4891 Unspecified atrial fibrillation: Secondary | ICD-10-CM

## 2012-12-31 NOTE — Progress Notes (Addendum)
Patient ID: Kristin Hood, female   DOB: [birth year], 72 y.o.   MRN: [id number]      [address] [place], [place]  [address] Phone: [phone] Fax:  [phone]  Date:  12/31/2012   ID:  Kristin Hood, DOB 08-20-1940, MRN [id number]  PCP:  Mickie Hillier, MD   History of Present Illness: Kristin Hood is a 72 y.o. female with atrial fibrillation discovered on 08/20/12, chronic anticoagulation, prior stroke, received TPA for cerebral artery occlusion (could not raise arm-8/13) here for followup.  Echocardiogram demonstrated mild to moderate left atrial enlargement, normal ejection fraction, mild aortic stenosis, MAC, mild mitral regurgitation.  Previously she did have some associated dyspnea on exertion with no chest pain, previously described as moderate in severity relieved with rest with no radiation of symptoms.  No associated angina. She had stress test which showed inferior ischemia, transient ischemic dilatation with normal ejection fraction. Does water exercises. Had 3 knee replacements. 25 min of stair steppers. She only went 2 minutes on the prior exercise treadmill test before significant shortness of breath ensued.   She underwent catheterization on 08/14/11 which showed no evidence of coronary artery disease. LVEDP was 14 mm mercury. This was after possible inferior wall ischemia as well as 3 times a day was seen on nuclear stress test.  Went to ER on 12/10/12 had a terrific pain in her chest. Lasted about hour at home BP 159/100, 3 times. Went on to ER. Was worried. Once she got to [facility] [place] she quit hurting. Sent to ER. Wanted to keep her overnight. Did troponin did well. Did OK. Prob GERD.   Wt Readings from Last 3 Encounters:  12/10/12 262 lb 3.2 oz (118.933 kg)  07/08/12 267 lb (121.11 kg)  06/18/12 269 lb (122.018 kg)     Past Medical History  Diagnosis Date  . Breast cancer 2001  . Hypertension     , With mild concentric left  ventricular hypertrophy  . Stroke   . Shingles   . Atrial fibrillation 07/2012    Eliquis, rate controlled  . Hx of adenomatous colonic polyps 2006    , Benign  . Mixed dyslipidemia   . Coarse tremors     , Essential  . CVA (cerebral vascular accident) 2013    Past Surgical History  Procedure Laterality Date  . Mastectomy      Radical, left   . Tee without cardioversion  11/19/2011    Procedure: TRANSESOPHAGEAL ECHOCARDIOGRAM (TEE);  Surgeon: Donato Schultz, MD;  Location: [facility] ENDOSCOPY;  Service: Cardiovascular;  Laterality: N/A;  . Knee surgery  2009    ,Total knee replacement  . Bunionectomy    . Cardiac catheterization  07/2010    , Without significant CAD  . Left rotator cuff    . Appendectomy    . Cesarean section    . Cardiac catheterization  07/2011    Dr. Anne Fu    Current Outpatient Prescriptions  Medication Sig Dispense Refill  . apixaban (ELIQUIS) 5 MG TABS tablet Take 5 mg by mouth 2 (two) times daily.      Marland Kitchen atorvastatin (LIPITOR) 10 MG tablet Take 10 mg by mouth daily.       . Calcium Carbonate-Vitamin D (CALCIUM + D PO) Take 1 tablet by mouth daily.      . fenofibrate 160 MG tablet Take 160 mg by mouth daily.       . Iron 66 MG TABS Take 1  tablet by mouth daily.      Marland Kitchen ketoconazole (NIZORAL) 2 % cream Apply 1 application topically daily.      . Multiple Vitamin (MULTIVITAMIN) tablet Take 1 tablet by mouth daily.      . Omega-3 Fatty Acids (FISH OIL PO) Take 980 mg by mouth 2 (two) times daily.       . propranolol (INDERAL) 60 MG tablet Take 120 mg by mouth 2 (two) times daily.        No current facility-administered medications for this visit.    Allergies:    Allergies  Allergen Reactions  . Floxin [Ofloxacin] Other (See Comments)    Nausea, Dizziness    Social History:  The patient  reports that she quit smoking about 20 years ago. Her smoking use included Cigarettes. She has a 32.5 pack-year smoking history. She has never used smokeless tobacco. She  reports that she drinks about 0.5 ounces of alcohol per week. She reports that she does not use illicit drugs.   ROS:  Please see the history of present illness.   No syncope, no bleeding, no orthopnea, no PND, no further strokelike symptoms   All other systems reviewed and negative.   PHYSICAL EXAM: VS:  There were no vitals taken for this visit. Well nourished, well developed, in no acute distress HEENT: normal Neck: no JVD Cardiac:  irreg normal rate S1, S2; RRR; no murmur Lungs:  clear to auscultation bilaterally, no wheezing, rhonchi or rales Abd: soft, nontender, no hepatomegalyobese Ext: no edema Skin: warm and dry Neuro: no focal abnormalities noted  EKG:  AFIB 77 No ST changes.  ASSESSMENT AND PLAN:  72 year old with atrial fibrillation, prior stroke on chronic anticoagulation with Eliquis here for followup.  1. Atrial fibrillation- Currently on Inderal, rate control. 2. Hyperlipidemia-currently on Lipitor 10 mg 3. Chronic anticoagulation-no bleeding. Continue to monitor CBC/creatinine every 6 months. 4. Chest pain - GERD. Doing well. Prior Cath reassuring.  5. Obesity-continue to encourage weight loss 6. Chronic diastolic heart failure-shortness of breath. May be playing a role. Also prior smoking history as well. 7. Returning to clinic in 6 months.  Signed, Donato Schultz, MD [facility]  12/31/2012 8:50 AM    I'm fine with her proceeding with blepharoplasty. She may hold her anticoagulation for 2 days prior to surgery, Eliquis. Resume shortly thereafter and felt comfortable by surgery.

## 2012-12-31 NOTE — Patient Instructions (Addendum)
Your physician recommends that you continue on your current medications as directed. Please refer to the Current Medication list given to you today.  Your physician wants you to follow-up in: 6 months with Dr. Skains. You will receive a reminder letter in the mail two months in advance. If you don't receive a letter, please call our office to schedule the follow-up appointment.  

## 2013-01-03 ENCOUNTER — Ambulatory Visit: Payer: Medicare Other

## 2013-01-11 ENCOUNTER — Ambulatory Visit
Admission: RE | Admit: 2013-01-11 | Discharge: 2013-01-11 | Disposition: A | Discharge status: Home / Self Care | Payer: Medicare Other | Source: Ambulatory Visit

## 2013-01-11 DIAGNOSIS — Z1231 Encounter for screening mammogram for malignant neoplasm of breast: Secondary | ICD-10-CM

## 2013-01-11 DIAGNOSIS — Z9012 Acquired absence of left breast and nipple: Secondary | ICD-10-CM

## 2013-01-24 ENCOUNTER — Telehealth: Payer: Self-pay | Admitting: Cardiology

## 2013-01-24 NOTE — Telephone Encounter (Signed)
New problem      Kristin Hood w/scheduling @ Dr Dimas Millin office  Fax # [phone], has faxed surp paper work  8/19 and 10/27 pt's surgery in on 11/3 on her upper eyelid.    Please fax back.

## 2013-01-24 NOTE — Telephone Encounter (Signed)
Surgical clearance faxed to [phone] and I notified Dr. Vella Raring office that fax has been sent.

## 2013-01-28 ENCOUNTER — Telehealth: Payer: Self-pay | Admitting: Cardiology

## 2013-01-28 NOTE — Telephone Encounter (Signed)
Received request from Nurse fax box, documents faxed for surgical clearance. To: [facility] Fax number: [phone] Attention: 01/28/13  Received request from Nurse fax box, documents faxed for surgical clearance. To: [facility]  Fax number: [phone] Attention: 01/28/13

## 2013-01-31 HISTORY — PX: EYE SURGERY: SHX253

## 2013-02-03 ENCOUNTER — Other Ambulatory Visit: Payer: Self-pay

## 2013-03-10 ENCOUNTER — Encounter: Payer: Self-pay | Admitting: Cardiology

## 2013-03-10 ENCOUNTER — Other Ambulatory Visit: Payer: Self-pay | Admitting: *Deleted

## 2013-03-10 MED ORDER — APIXABAN 5 MG PO TABS: 5.0000 mg | ORAL_TABLET | Freq: Two times a day (BID) | ORAL | Status: DC

## 2013-03-10 NOTE — Telephone Encounter (Signed)
See previous telephone note. Pt needed 1 month supply of Eliquis sent to [facility] at [address]. Sent in & pt aware. Mylo Red RN

## 2013-03-10 NOTE — Telephone Encounter (Signed)
  I checked & we did not have any Eliquis samples I spoke with pt & she needs a 1 month prescription for her Eliquis b/c her insurance is changing in January.  Refill for Eliquis sent Mylo Red RN

## 2013-03-11 ENCOUNTER — Telehealth: Payer: Self-pay

## 2013-03-11 ENCOUNTER — Telehealth: Payer: Self-pay | Admitting: *Deleted

## 2013-03-11 NOTE — Telephone Encounter (Signed)
According to husband patient used the 30 day card and got her a 30 day supply of eliquis.

## 2013-03-11 NOTE — Telephone Encounter (Signed)
Patient called wanting samples of elquis samples placed up front

## 2013-04-05 ENCOUNTER — Encounter: Payer: Self-pay | Admitting: Cardiology

## 2013-04-07 ENCOUNTER — Telehealth: Payer: Self-pay | Admitting: *Deleted

## 2013-04-07 NOTE — Telephone Encounter (Signed)
[place] PA faxed in for eliquis

## 2013-04-10 ENCOUNTER — Other Ambulatory Visit: Payer: Self-pay

## 2013-04-10 MED ORDER — PROPRANOLOL HCL 60 MG PO TABS: ORAL_TABLET | ORAL | Status: DC

## 2013-04-19 NOTE — Progress Notes (Signed)
Patient was approved for eliquis 5mg  until 11.8.16 ID#  [id number]   [facility] [phone]

## 2013-05-02 ENCOUNTER — Other Ambulatory Visit: Payer: Self-pay

## 2013-05-02 MED ORDER — APIXABAN 5 MG PO TABS: 5.0000 mg | ORAL_TABLET | Freq: Two times a day (BID) | ORAL | Status: DC

## 2013-06-15 ENCOUNTER — Encounter: Payer: Self-pay | Admitting: Cardiology

## 2013-07-08 ENCOUNTER — Other Ambulatory Visit: Payer: Self-pay | Admitting: Cardiology

## 2013-07-08 ENCOUNTER — Ambulatory Visit (HOSPITAL_COMMUNITY): Payer: Medicare Other | Attending: Cardiology | Admitting: Cardiology

## 2013-07-08 DIAGNOSIS — R079 Chest pain, unspecified: Secondary | ICD-10-CM | POA: Insufficient documentation

## 2013-07-08 DIAGNOSIS — I4891 Unspecified atrial fibrillation: Secondary | ICD-10-CM

## 2013-07-08 NOTE — Progress Notes (Signed)
Echo performed. 

## 2013-08-03 ENCOUNTER — Ambulatory Visit (INDEPENDENT_AMBULATORY_CARE_PROVIDER_SITE_OTHER): Payer: Medicare Other | Admitting: Cardiology

## 2013-08-03 ENCOUNTER — Encounter: Payer: Self-pay | Admitting: Cardiology

## 2013-08-03 VITALS — BP 132/82 | HR 76 | Ht 66.0 in | Wt 259.0 lb

## 2013-08-03 DIAGNOSIS — Z7901 Long term (current) use of anticoagulants: Secondary | ICD-10-CM

## 2013-08-03 DIAGNOSIS — I4891 Unspecified atrial fibrillation: Secondary | ICD-10-CM

## 2013-08-03 DIAGNOSIS — E782 Mixed hyperlipidemia: Secondary | ICD-10-CM

## 2013-08-03 LAB — CBC WITH DIFFERENTIAL/PLATELET
Basophils Absolute: 0 10*3/uL (ref 0.0–0.1)
Basophils Relative: 0.5 % (ref 0.0–3.0)
EOS PCT: 4.1 % (ref 0.0–5.0)
Eosinophils Absolute: 0.2 10*3/uL (ref 0.0–0.7)
HEMATOCRIT: 39.8 % (ref 36.0–46.0)
Hemoglobin: 13.2 g/dL (ref 12.0–15.0)
LYMPHS ABS: 0.8 10*3/uL (ref 0.7–4.0)
Lymphocytes Relative: 17.6 % (ref 12.0–46.0)
MCHC: 33.3 g/dL (ref 30.0–36.0)
MCV: 87.1 fl (ref 78.0–100.0)
MONO ABS: 0.6 10*3/uL (ref 0.1–1.0)
Monocytes Relative: 13.7 % — ABNORMAL HIGH (ref 3.0–12.0)
NEUTROS ABS: 2.9 10*3/uL (ref 1.4–7.7)
Neutrophils Relative %: 64.1 % (ref 43.0–77.0)
PLATELETS: 283 10*3/uL (ref 150.0–400.0)
RBC: 4.57 Mil/uL (ref 3.87–5.11)
RDW: 14.6 % (ref 11.5–15.5)
WBC: 4.6 10*3/uL (ref 4.0–10.5)

## 2013-08-03 LAB — BASIC METABOLIC PANEL
BUN: 21 mg/dL (ref 6–23)
CALCIUM: 10.1 mg/dL (ref 8.4–10.5)
CO2: 27 mEq/L (ref 19–32)
CREATININE: 0.8 mg/dL (ref 0.4–1.2)
Chloride: 102 mEq/L (ref 96–112)
GFR: 76.72 mL/min (ref >60.00)
GLUCOSE: 81 mg/dL (ref 70–99)
Potassium: 4.5 mEq/L (ref 3.5–5.1)
SODIUM: 137 meq/L (ref 135–145)

## 2013-08-03 NOTE — Progress Notes (Signed)
Patient ID: Kristin Hood, female   DOB: 1940-05-05, 73 y.o.   MRN: [id number]      [address], [place]  [address] Phone: [phone] Fax:  [phone]  Date:  08/03/2013   ID:  Kristin Hood, DOB 1940-10-07, MRN [id number]  PCP:  Gennette Pac, MD   History of Present Illness: Kristin Hood is a 73 y.o. female with atrial fibrillation discovered on 08/20/12, chronic anticoagulation, prior stroke, received TPA for cerebral artery occlusion (could not raise arm-8/13) here for followup.  Echocardiogram demonstrated mild to moderate left atrial enlargement, normal ejection fraction, mild aortic stenosis, MAC, mild mitral regurgitation.  Previously she did have some associated dyspnea on exertion with no chest pain, previously described as moderate in severity relieved with rest with no radiation of symptoms.  No associated angina. She had stress test which showed inferior ischemia, transient ischemic dilatation with normal ejection fraction. Does water exercises. Had 3 knee replacements. 25 min of stair steppers. She only went 2 minutes on the prior exercise treadmill test before significant shortness of breath ensued.   She underwent catheterization on 08/14/11 which showed no evidence of coronary artery disease. LVEDP was 14 mm mercury. This was after possible inferior wall ischemia as well as 3 times a day was seen on nuclear stress test.  Went to ER on 12/10/12 had a terrific pain in her chest. Lasted about hour at home BP 159/100, 3 times. Went on to ER. Was worried. Once she got to [facility] [place] she quit hurting. Sent to ER. Wanted to keep her overnight. Did troponin did well. Did OK. Prob GERD.   Recently has been undergoing bronchitis. She is currently on antibiotic. Productive cough. Atrial fibrillation has been under good control.  Wt Readings from Last 3 Encounters:  08/03/13 259 lb (117.482 kg)  12/31/12 260 lb (117.935 kg)  12/10/12 262 lb 3.2  oz (118.933 kg)     Past Medical History  Diagnosis Date  . Breast cancer 2001  . Hypertension     , With mild concentric left ventricular hypertrophy  . Stroke   . Shingles   . Atrial fibrillation 07/2012    Eliquis, rate controlled  . Hx of adenomatous colonic polyps 2006    , Benign  . Mixed dyslipidemia   . Coarse tremors     , Essential  . CVA (cerebral vascular accident) 2013    Past Surgical History  Procedure Laterality Date  . Mastectomy      Radical, left   . Tee without cardioversion  11/19/2011    Procedure: TRANSESOPHAGEAL ECHOCARDIOGRAM (TEE);  Surgeon: [name], MD;  Location: [facility] ENDOSCOPY;  Service: Cardiovascular;  Laterality: N/A;  . Knee surgery  2009    ,Total knee replacement  . Bunionectomy    . Cardiac catheterization  07/2010    , Without significant CAD  . Left rotator cuff    . Appendectomy    . Cesarean section    . Cardiac catheterization  07/2011    [name]    Current Outpatient Prescriptions  Medication Sig Dispense Refill  . AMOXICILLIN PO Take by mouth as directed. Sinus infection      . apixaban (ELIQUIS) 5 MG TABS tablet Take 1 tablet (5 mg total) by mouth 2 (two) times daily.  180 tablet  1  . atorvastatin (LIPITOR) 10 MG tablet Take 10 mg by mouth daily.       . fenofibrate 160 MG  tablet Take 160 mg by mouth daily.       . Iron 66 MG TABS Take 1 tablet by mouth daily.      Marland Kitchen losartan (COZAAR) 50 MG tablet Take 50 mg by mouth daily.      . Multiple Vitamin (MULTIVITAMIN) tablet Take 1 tablet by mouth daily.      . Omega-3 Fatty Acids (FISH OIL PO) Take 980 mg by mouth 2 (two) times daily.       . propranolol (INDERAL) 60 MG tablet Take 2 tablets in the morning, 1 tablet at midday and 1 tablet at night  360 tablet  0   No current facility-administered medications for this visit.    Allergies:    Allergies  Allergen Reactions  . Floxin [Ofloxacin] Other (See Comments)    Nausea, Dizziness    Social History:  The patient   reports that she quit smoking about 21 years ago. Her smoking use included Cigarettes. She has a 32.5 pack-year smoking history. She has never used smokeless tobacco. She reports that she drinks about .5 ounces of alcohol per week. She reports that she does not use illicit drugs.   ROS:  Please see the history of present illness.   No syncope, no bleeding, no orthopnea, no PND, no further strokelike symptoms   All other systems reviewed and negative.   PHYSICAL EXAM: VS:  BP 132/82  Pulse 76  Ht 5\' 6"  (1.676 m)  Wt 259 lb (117.482 kg)  BMI 41.82 kg/m2 Well nourished, well developed, in no acute distress HEENT: normal Neck: no JVD Cardiac:  irreg normal rate S1, S2; RRR; no murmurdistant heart sounds Lungs:  clear to auscultation bilaterally, no wheezing, rhonchi or rales Abd: soft, nontender, no hepatomegalyobese Ext: no edema Skin: warm and dry Neuro: no focal abnormalities noted mild tremor  EKG:  AFIB 77 No ST changes.  ASSESSMENT AND PLAN:  73 year old with atrial fibrillation, prior stroke on chronic anticoagulation with Eliquis here for followup.  1. Atrial fibrillation- Currently on Inderal, rate control. 2. Hyperlipidemia-currently on Lipitor 10 mg 3. Chronic anticoagulation-no bleeding. Continue to monitor CBC/creatinine every 6 months. 4. Chest pain - GERD. Doing well. Prior Cath reassuring.  5. Obesity-continue to encourage weight loss 6. Chronic diastolic heart failure-shortness of breath. May be playing a role. Also prior smoking history as well. 7. Bronchitis-currently taking antibiotics. Resolving. 8. Returning to clinic in 6 months.  Signed, [name], MD [facility]  08/03/2013 1:37 PM

## 2013-08-03 NOTE — Patient Instructions (Signed)
Your physician recommends that you have for lab work today:CBC,BMET  Your physician recommends that you continue on your current medications as directed. Please refer to the Current Medication list given to you today.     Your physician wants you to follow-up in: 6 months with Dr. Dawna Part will receive a reminder letter in the mail two months in advance. If you don't receive a letter, please call our office to schedule the follow-up appointment.

## 2013-09-15 ENCOUNTER — Other Ambulatory Visit: Payer: Self-pay | Admitting: Cardiology

## 2013-11-12 ENCOUNTER — Emergency Department (HOSPITAL_COMMUNITY): Payer: Medicare Other

## 2013-11-12 ENCOUNTER — Encounter (HOSPITAL_COMMUNITY): Payer: Self-pay | Admitting: Emergency Medicine

## 2013-11-12 ENCOUNTER — Emergency Department (HOSPITAL_COMMUNITY)
Admission: EM | Admit: 2013-11-12 | Discharge: 2013-11-12 | Disposition: A | Discharge status: Home / Self Care | Payer: Medicare Other | Attending: Emergency Medicine | Admitting: Emergency Medicine

## 2013-11-12 DIAGNOSIS — Z8619 Personal history of other infectious and parasitic diseases: Secondary | ICD-10-CM | POA: Insufficient documentation

## 2013-11-12 DIAGNOSIS — I10 Essential (primary) hypertension: Secondary | ICD-10-CM | POA: Diagnosis not present

## 2013-11-12 DIAGNOSIS — Z8673 Personal history of transient ischemic attack (TIA), and cerebral infarction without residual deficits: Secondary | ICD-10-CM | POA: Insufficient documentation

## 2013-11-12 DIAGNOSIS — Z853 Personal history of malignant neoplasm of breast: Secondary | ICD-10-CM | POA: Insufficient documentation

## 2013-11-12 DIAGNOSIS — E782 Mixed hyperlipidemia: Secondary | ICD-10-CM | POA: Insufficient documentation

## 2013-11-12 DIAGNOSIS — E7800 Pure hypercholesterolemia, unspecified: Secondary | ICD-10-CM | POA: Insufficient documentation

## 2013-11-12 DIAGNOSIS — Z860100 Personal history of colon polyps, unspecified: Secondary | ICD-10-CM | POA: Insufficient documentation

## 2013-11-12 DIAGNOSIS — Z79899 Other long term (current) drug therapy: Secondary | ICD-10-CM | POA: Diagnosis not present

## 2013-11-12 DIAGNOSIS — Z87891 Personal history of nicotine dependence: Secondary | ICD-10-CM | POA: Diagnosis not present

## 2013-11-12 DIAGNOSIS — R079 Chest pain, unspecified: Secondary | ICD-10-CM | POA: Insufficient documentation

## 2013-11-12 DIAGNOSIS — Z9889 Other specified postprocedural states: Secondary | ICD-10-CM | POA: Insufficient documentation

## 2013-11-12 DIAGNOSIS — Z7902 Long term (current) use of antithrombotics/antiplatelets: Secondary | ICD-10-CM | POA: Insufficient documentation

## 2013-11-12 HISTORY — DX: Pure hypercholesterolemia, unspecified: E78.00

## 2013-11-12 LAB — BASIC METABOLIC PANEL
Anion gap: 14 (ref 5–15)
BUN: 18 mg/dL (ref 6–23)
CHLORIDE: 103 meq/L (ref 96–112)
CO2: 24 meq/L (ref 19–32)
Calcium: 10.2 mg/dL (ref 8.4–10.5)
Creatinine, Ser: 0.69 mg/dL (ref 0.50–1.10)
GFR calc Af Amer: >90 mL/min (ref >90)
GFR calc non Af Amer: 84 mL/min — ABNORMAL LOW (ref >90)
GLUCOSE: 134 mg/dL — AB (ref 70–99)
POTASSIUM: 4.1 meq/L (ref 3.7–5.3)
Sodium: 141 mEq/L (ref 137–147)

## 2013-11-12 LAB — CBC
HEMATOCRIT: 41 % (ref 36.0–46.0)
HEMOGLOBIN: 13.4 g/dL (ref 12.0–15.0)
MCH: 28.6 pg (ref 26.0–34.0)
MCHC: 32.7 g/dL (ref 30.0–36.0)
MCV: 87.6 fL (ref 78.0–100.0)
Platelets: 266 10*3/uL (ref 150–400)
RBC: 4.68 MIL/uL (ref 3.87–5.11)
RDW: 15.1 % (ref 11.5–15.5)
WBC: 3.7 10*3/uL — ABNORMAL LOW (ref 4.0–10.5)

## 2013-11-12 LAB — I-STAT TROPONIN, ED: TROPONIN I, POC: 0 ng/mL (ref 0.00–0.08)

## 2013-11-12 LAB — PRO B NATRIURETIC PEPTIDE: Pro B Natriuretic peptide (BNP): 1893 pg/mL — ABNORMAL HIGH (ref 0–125)

## 2013-11-12 LAB — TROPONIN I: Troponin I: <0.3 ng/mL (ref <0.30)

## 2013-11-12 MED ORDER — NITROGLYCERIN 0.4 MG SL SUBL: 0.4000 mg | SUBLINGUAL_TABLET | SUBLINGUAL | Status: DC | PRN

## 2013-11-12 MED ORDER — IOHEXOL 350 MG/ML SOLN
100.0000 mL | Freq: Once | INTRAVENOUS | Status: AC | PRN
  Administered 2013-11-12: 100 mL via INTRAVENOUS

## 2013-11-12 NOTE — Discharge Instructions (Signed)
Please follow up with your primary care physician in 1-2 days. If you do not have one please call the [place] number listed above. Please have your primary care physician schedule your PET scan. Please read all discharge instructions and return precautions.    Chest Pain (Nonspecific) It is often hard to give a specific diagnosis for the cause of chest pain. There is always a chance that your pain could be related to something serious, such as a heart attack or a blood clot in the lungs. You need to follow up with your health care provider for further evaluation. CAUSES   Heartburn.  Pneumonia or bronchitis.  Anxiety or stress.  Inflammation around your heart (pericarditis) or lung (pleuritis or pleurisy).  A blood clot in the lung.  A collapsed lung (pneumothorax). It can develop suddenly on its own (spontaneous pneumothorax) or from trauma to the chest.  Shingles infection (herpes zoster virus). The chest wall is composed of bones, muscles, and cartilage. Any of these can be the source of the pain.  The bones can be bruised by injury.  The muscles or cartilage can be strained by coughing or overwork.  The cartilage can be affected by inflammation and become sore (costochondritis). DIAGNOSIS  Lab tests or other studies may be needed to find the cause of your pain. Your health care provider may have you take a test called an ambulatory electrocardiogram (ECG). An ECG records your heartbeat patterns over a 24-hour period. You may also have other tests, such as:  Transthoracic echocardiogram (TTE). During echocardiography, sound waves are used to evaluate how blood flows through your heart.  Transesophageal echocardiogram (TEE).  Cardiac monitoring. This allows your health care provider to monitor your heart rate and rhythm in real time.  Holter monitor. This is a portable device that records your heartbeat and can help diagnose heart arrhythmias. It allows  your health care provider to track your heart activity for several days, if needed.  Stress tests by exercise or by giving medicine that makes the heart beat faster. TREATMENT   Treatment depends on what may be causing your chest pain. Treatment may include:  Acid blockers for heartburn.  Anti-inflammatory medicine.  Pain medicine for inflammatory conditions.  Antibiotics if an infection is present.  You may be advised to change lifestyle habits. This includes stopping smoking and avoiding alcohol, caffeine, and chocolate.  You may be advised to keep your head raised (elevated) when sleeping. This reduces the chance of acid going backward from your stomach into your esophagus. Most of the time, nonspecific chest pain will improve within 2-3 days with rest and mild pain medicine.  HOME CARE INSTRUCTIONS   If antibiotics were prescribed, take them as directed. Finish them even if you start to feel better.  For the next few days, avoid physical activities that bring on chest pain. Continue physical activities as directed.  Do not use any tobacco products, including cigarettes, chewing tobacco, or electronic cigarettes.  Avoid drinking alcohol.  Only take medicine as directed by your health care provider.  Follow your health care provider's suggestions for further testing if your chest pain does not go away.  Keep any follow-up appointments you made. If you do not go to an appointment, you could develop lasting (chronic) problems with pain. If there is any problem keeping an appointment, call to reschedule. SEEK MEDICAL CARE IF:   Your chest pain does not go away, even after treatment.  You have a rash  with blisters on your chest.  You have a fever. SEEK IMMEDIATE MEDICAL CARE IF:   You have increased chest pain or pain that spreads to your arm, neck, jaw, back, or abdomen.  You have shortness of breath.  You have an increasing cough, or you cough up blood.  You have  severe back or abdominal pain.  You feel nauseous or vomit.  You have severe weakness.  You faint.  You have chills. This is an emergency. Do not wait to see if the pain will go away. Get medical help at once. Call your local emergency services (911 in [place] Do not drive yourself to the hospital. MAKE SURE YOU:   Understand these instructions.  Will watch your condition.  Will get help right away if you are not doing well or get worse. Document Released: 12/25/2004 Document Revised: 03/22/2013 Document Reviewed: 10/21/2007 [facility] Patient Information 2015 [place], [place]. This information is not intended to replace advice given to you by your health care provider. Make sure you discuss any questions you have with your health care provider.

## 2013-11-12 NOTE — ED Notes (Signed)
PA aware that pt is having sharp left sided pain in her chest.

## 2013-11-12 NOTE — ED Provider Notes (Signed)
CSN: [id number]     Arrival date & time 11/12/13  1128 History   First MD Initiated Contact with Patient 11/12/13 1149     Chief Complaint  Patient presents with  . Chest Pain     (Consider location/radiation/quality/duration/timing/severity/associated sxs/prior Treatment) HPI Comments: Patient is a 73 yo F PMHx significant for HTN, CVA, A. Fib on Propanol and Eliquis, HLD presenting to the ED for intermittent episodes of left sided chest pressure x 3 days. Each episode of chest pain lasts approximately 30-40 minutes and resolves with time. Patient adamantly denies SOB, diaphoresis, or radiation of chest pain on my encounter with patient. Last episode was at 10AM this morning. Patient is followed by [name]. Last echocardiogram was performed May 2015. Last catheterization on 08/14/11 which showed no evidence of coronary artery disease. No medications changes. Patient does endorse increased stress recently.    Past Medical History  Diagnosis Date  . Hypertension     , With mild concentric left ventricular hypertrophy  . Stroke   . Shingles   . Atrial fibrillation 07/2012    Eliquis, rate controlled  . Hx of adenomatous colonic polyps 2006    , Benign  . Mixed dyslipidemia   . Coarse tremors     , Essential  . CVA (cerebral vascular accident) 2013  . Hypercholesteremia   . Breast cancer 2001    left mastectomy   Past Surgical History  Procedure Laterality Date  . Mastectomy      Radical, left   . Tee without cardioversion  11/19/2011    Procedure: TRANSESOPHAGEAL ECHOCARDIOGRAM (TEE);  Surgeon: [name], MD;  Location: [facility] ENDOSCOPY;  Service: Cardiovascular;  Laterality: N/A;  . Knee surgery  2009    ,Total knee replacement  . Bunionectomy    . Cardiac catheterization  07/2010    , Without significant CAD  . Left rotator cuff    . Appendectomy    . Cesarean section    . Cardiac catheterization  07/2011    [name]   Family History  Problem Relation Age of Onset   . Heart disease Mother   . Heart disease Father    History  Substance Use Topics  . Smoking status: Former Smoker -- 2.50 packs/day for 13 years    Types: Cigarettes    Quit date: 01/30/1992  . Smokeless tobacco: Never Used  . Alcohol Use: 0.5 oz/week    1 drink(s) per week   OB History   Grav Para Term Preterm Abortions TAB SAB Ect Mult Living                 Review of Systems  Cardiovascular: Positive for chest pain.  All other systems reviewed and are negative.     Allergies  Floxin  Home Medications   Prior to Admission medications   Medication Sig Start Date End Date Taking? Authorizing Provider  amoxicillin (AMOXIL) 500 MG capsule Take 2,000 mg by mouth once. 1 hour prior to dental appointments   Yes Historical Provider, MD  apixaban (ELIQUIS) 5 MG TABS tablet Take 5 mg by mouth 2 (two) times daily.   Yes Historical Provider, MD  atorvastatin (LIPITOR) 10 MG tablet Take 10 mg by mouth daily.    Yes Historical Provider, MD  fenofibrate 160 MG tablet Take 160 mg by mouth daily.    Yes Historical Provider, MD  ferrous sulfate 325 (65 FE) MG EC tablet Take 325 mg by mouth daily.   Yes Historical Provider, MD  losartan (COZAAR) 100 MG tablet Take 100 mg by mouth daily.   Yes Historical Provider, MD  Multiple Vitamin (MULTIVITAMIN) tablet Take 1 tablet by mouth daily. Calcium 300 mg   Yes Historical Provider, MD  Omega-3 Fatty Acids (FISH OIL PO) Take 1,960 mg by mouth 2 (two) times daily.    Yes Historical Provider, MD  propranolol (INDERAL) 60 MG tablet Take 60-120 mg by mouth 3 (three) times daily. Take 2 tablets in the morning, 1 in the afternoon, and 1 in the evening.   Yes Historical Provider, MD   BP 143/64  Pulse 74  Temp(Src) 98 F (36.7 C) (Oral)  Resp 20  Ht 5\' 6"  (1.676 m)  SpO2 97% Physical Exam  Nursing note and vitals reviewed. Constitutional: She is oriented to person, place, and time. She appears well-developed and well-nourished. No distress.   HENT:  Head: Normocephalic and atraumatic.  Right Ear: External ear normal.  Left Ear: External ear normal.  Nose: Nose normal.  Mouth/Throat: Oropharynx is clear and moist. No oropharyngeal exudate.  Eyes: Conjunctivae are normal.  Neck: Neck supple.  Cardiovascular: Normal rate and normal heart sounds.  An irregularly irregular rhythm present.  Pulmonary/Chest: Effort normal and breath sounds normal. No respiratory distress. She exhibits no tenderness.  Abdominal: Soft. There is no tenderness.  Musculoskeletal: She exhibits no edema.  MAE x 4.  Neurological: She is alert and oriented to person, place, and time.  Skin: Skin is warm and dry. She is not diaphoretic.    ED Course  Procedures (including critical care time) Medications  nitroGLYCERIN (NITROSTAT) SL tablet 0.4 mg (not administered)  iohexol (OMNIPAQUE) 350 MG/ML injection 100 mL (100 mLs Intravenous Contrast Given 11/12/13 1449)    Labs Review Labs Reviewed  CBC - Abnormal; Notable for the following:    WBC 3.7 (*)    All other components within normal limits  PRO B NATRIURETIC PEPTIDE - Abnormal; Notable for the following:    Pro B Natriuretic peptide (BNP) 1893.0 (*)    All other components within normal limits  BASIC METABOLIC PANEL - Abnormal; Notable for the following:    Glucose, Bld 134 (*)    GFR calc non Af Amer 84 (*)    All other components within normal limits  TROPONIN I  TROPONIN I  I-STAT TROPOININ, ED    Imaging Review Dg Chest 2 View  11/12/2013   CLINICAL DATA:  Left-sided chest pain for 3 days. Hypertension. Breast cancer. Prior CVA.  EXAM: CHEST  2 VIEW  COMPARISON:  12/10/2012  FINDINGS: Midline trachea. Mild cardiomegaly. Mediastinal contours otherwise within normal limits. No pleural effusion or pneumothorax. No congestive failure. Clear lungs.  IMPRESSION: Mild cardiomegaly, without acute disease.   Electronically Signed   By: Abigail Miyamoto M.D.   On: 11/12/2013 13:24   Ct Angio Chest  Pe W/cm &/or Wo Cm  11/12/2013   CLINICAL DATA:  Left-sided chest pain and shortness of breath for 3 days. Symptoms worse with is are sharp.  EXAM: CT ANGIOGRAPHY CHEST WITH CONTRAST  TECHNIQUE: Multidetector CT imaging of the chest was performed using the standard protocol during bolus administration of intravenous contrast. Multiplanar CT image reconstructions and MIPs were obtained to evaluate the vascular anatomy.  CONTRAST:  100 mL OMNIPAQUE IOHEXOL 350 MG/ML SOLN  COMPARISON:  PA and Lat chest earlier this same day.  FINDINGS: No pulmonary embolus is identified. There is cardiomegaly. No pleural or pericardial effusion. The patient is status post left mastectomy.  There is a subcarinal lymph node on image 40 measuring 2.3 x 2.0 cm. Precarinal lymph node on image 32 measures 1.2 cm. Right hilar No on image 44 measures 1.2 cm. There is abnormal increased soft tissue density along the right side of the esophagus likely representing lymphadenopathy. Discrete measurement is difficult as it is contiguous with the esophagus but on image 69, a node measures approximately 2.7 x 1.7 cm. Calcific aortic and coronary atherosclerosis is present. Lungs demonstrate scattered ground-glass attenuation bilaterally. There is also some dependent atelectasis.  Visualized upper abdomen shows no focal abnormality. The spleen appear somewhat prominent but is incompletely visualized. No focal bony abnormality is identified.  Review of the MIP images confirms the above findings.  IMPRESSION: Negative for pulmonary embolus.  Mediastinal and retroperitoneal lymph nodes are nonspecific but are worrisome for lymphoproliferative process. Granulomatous disease is possible but less likely. Recommend clinical correlation. PET scan could also be used for further evaluation.  Bilateral ground-glass attenuation the lungs is nonspecific and could be due to mild interstitial edema given cardiomegaly. Atelectasis or hypersensitivity pneumonitis  could create a similar appearance.   Electronically Signed   By: Inge Rise M.D.   On: 11/12/2013 15:26     EKG Interpretation   Date/Time:  Saturday November 12 2013 11:38:45 EDT Ventricular Rate:  83 PR Interval:    QRS Duration: 86 QT Interval:  374 QTC Calculation: 439 R Axis:   -18 Text Interpretation:  Atrial fibrillation Abnormal ECG since last tracing  no significant change Confirmed by Eulis Foster  MD, ELLIOTT ([id number]) on 11/12/2013  12:43:29 PM      1216PM Patient endorses episode of CP beginning again. No associated symptoms.   MDM   Final diagnoses:  Chest pain, unspecified chest pain type    Filed Vitals:   11/12/13 1543  BP: 143/64  Pulse: 74  Temp:   Resp: 20   Afebrile, NAD, non-toxic appearing, AAOx4.   Patient is to be discharged with recommendation to follow up with PCP in regards to today's hospital visit. Chest pain is not likely of cardiac or pulmonary etiology d/t presentation, no evidence of PE on CT angio chest, VSS, no tracheal deviation, no JVD or new murmur, RRR, breath sounds equal bilaterally, EKG without acute abnormalities from baseline A. Fib, negative troponin, and negative CXR. Discussed CT scan results with patient with need for PET scan follow up. Patient is aware and understands the need and will contact her PCP as she is no longer followed by an oncologist. Plan to delta troponin patient with d/c home. Patient is agreeable to the plan. Signed out to [employer], PA-C.  Patient d/w with Dr. Eulis Foster, agrees with plan.    [employer] [place], PA-C 11/12/13 1600

## 2013-11-12 NOTE — ED Notes (Signed)
PA at bedside.

## 2013-11-12 NOTE — ED Provider Notes (Signed)
  Face-to-face evaluation   History: Intermittent chest pain, for several days. The chest pain does not have any associated symptoms. It occurs both at rest and with activity, and resolves spontaneously. She is worried that she might have a pulmonary embolus  Physical exam: Alert, elderly, female, who is somewhat uncomfortable. She is obese. No localized calf tenderness, or lower leg swelling. Iregular rate and rhythm, without murmur.  Medical screening examination/treatment/procedure(s) were conducted as a shared visit with non-physician practitioner(s) and myself.  I personally evaluated the patient during the encounter   Richarda Blade, MD 11/13/13 [phone]

## 2013-11-12 NOTE — ED Notes (Signed)
This Rn called CT and informed them of second IV in the rt [facility]

## 2013-11-12 NOTE — ED Notes (Signed)
Pt reports intermittent left chest pressure with SOB, diaphoresis x 3 days with occasional radiation to left neck. Denies pain or SOB at this time. AO x4, NAD.

## 2013-11-14 ENCOUNTER — Other Ambulatory Visit (HOSPITAL_COMMUNITY): Payer: Self-pay | Admitting: Family Medicine

## 2013-11-14 DIAGNOSIS — R599 Enlarged lymph nodes, unspecified: Secondary | ICD-10-CM

## 2013-11-17 ENCOUNTER — Ambulatory Visit (HOSPITAL_COMMUNITY)
Admission: RE | Admit: 2013-11-17 | Discharge: 2013-11-17 | Disposition: A | Discharge status: Home / Self Care | Payer: Medicare Other | Source: Ambulatory Visit | Attending: Family Medicine | Admitting: Family Medicine

## 2013-11-17 ENCOUNTER — Encounter (HOSPITAL_COMMUNITY): Payer: Self-pay

## 2013-11-17 DIAGNOSIS — E041 Nontoxic single thyroid nodule: Secondary | ICD-10-CM | POA: Diagnosis not present

## 2013-11-17 DIAGNOSIS — R599 Enlarged lymph nodes, unspecified: Secondary | ICD-10-CM | POA: Insufficient documentation

## 2013-11-17 LAB — GLUCOSE, CAPILLARY: GLUCOSE-CAPILLARY: 97 mg/dL (ref 70–99)

## 2013-11-17 MED ORDER — FLUDEOXYGLUCOSE F - 18 (FDG) INJECTION
14.0000 | Freq: Once | INTRAVENOUS | Status: AC | PRN
  Administered 2013-11-17: 14 via INTRAVENOUS

## 2013-11-18 ENCOUNTER — Other Ambulatory Visit: Payer: Self-pay | Admitting: Family Medicine

## 2013-11-18 DIAGNOSIS — E041 Nontoxic single thyroid nodule: Secondary | ICD-10-CM

## 2013-11-21 ENCOUNTER — Ambulatory Visit
Admission: RE | Admit: 2013-11-21 | Discharge: 2013-11-21 | Disposition: A | Discharge status: Home / Self Care | Payer: Medicare Other | Source: Ambulatory Visit | Attending: Family Medicine | Admitting: Family Medicine

## 2013-11-21 DIAGNOSIS — E041 Nontoxic single thyroid nodule: Secondary | ICD-10-CM

## 2013-11-24 ENCOUNTER — Other Ambulatory Visit: Payer: Self-pay | Admitting: Family Medicine

## 2013-11-24 DIAGNOSIS — E041 Nontoxic single thyroid nodule: Secondary | ICD-10-CM

## 2013-11-25 ENCOUNTER — Encounter: Payer: Self-pay | Admitting: Podiatry

## 2013-11-25 ENCOUNTER — Ambulatory Visit (INDEPENDENT_AMBULATORY_CARE_PROVIDER_SITE_OTHER): Payer: Medicare Other

## 2013-11-25 ENCOUNTER — Ambulatory Visit (INDEPENDENT_AMBULATORY_CARE_PROVIDER_SITE_OTHER): Payer: Medicare Other | Admitting: Podiatry

## 2013-11-25 VITALS — BP 141/83 | HR 73 | Resp 16 | Ht 66.0 in | Wt 259.0 lb

## 2013-11-25 DIAGNOSIS — M79609 Pain in unspecified limb: Secondary | ICD-10-CM

## 2013-11-25 DIAGNOSIS — M7750 Other enthesopathy of unspecified foot: Secondary | ICD-10-CM

## 2013-11-25 DIAGNOSIS — M7741 Metatarsalgia, right foot: Secondary | ICD-10-CM

## 2013-11-25 DIAGNOSIS — M2030 Hallux varus (acquired), unspecified foot: Secondary | ICD-10-CM

## 2013-11-25 DIAGNOSIS — M79671 Pain in right foot: Secondary | ICD-10-CM

## 2013-11-25 DIAGNOSIS — M2040 Other hammer toe(s) (acquired), unspecified foot: Secondary | ICD-10-CM

## 2013-11-25 NOTE — Progress Notes (Signed)
   Subjective:    Patient ID: Kristin Hood, female    DOB: [birth year], 73 y.o.   MRN: [id number]  HPI Comments: 73 year old female presents the office today with complaints of right submetatarsal for pain. She states that the pain has been ongoing for the last couple weeks and has been increasing. She states that she feels a "knot" on the bottom. She denies any history of trauma or injury to the area. Pain with ambulation and weightbearing/pressure. No other complaints at this time.   Foot Pain Associated symptoms include arthralgias.      Review of Systems  Endocrine:       Pt states she has been diagnosed with 3 or 4 positive lymph node in her abdominal area. Pt is to have surgery on thyroid this week.  Musculoskeletal: Positive for arthralgias and gait problem.  Neurological: Positive for tremors.  All other systems reviewed and are negative.      Objective:   Physical Exam AAO x3, NAD DP/PT pulses palpable. CRT < 3 sec Protective sensation intact with Simms Weinstein monofilament, vibratory sensation intact. Right foot hallux varus deformity, reducible. 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 Tenderness over prominent fourth metatarsal head. No associated hyperkeratotic lesion. No open skin lesions  MMT 5/5, ROM WNL No leg pain, swelling, warmth.       Assessment & Plan:  73 year old female with hallux varus, hammertoe deformities with prominent 4th metatarsal head plantar.  -X-rays were obtained and reviewed with the patient in detail. See x-ray report for full details. No acute fracture. -Discussed various treatments including conservative versus surgical with alternatives, risks, complications. -Discussed supportive shoe gear -Dispensed metatarsal pads which were applied and upon weightbearing patient states they felt good and had relief of symptoms. -Followup as needed or if there are any change or worsening in  symptoms.

## 2013-11-26 ENCOUNTER — Encounter: Payer: Self-pay | Admitting: Podiatry

## 2013-12-01 ENCOUNTER — Ambulatory Visit
Admission: RE | Admit: 2013-12-01 | Discharge: 2013-12-01 | Disposition: A | Discharge status: Home / Self Care | Payer: Medicare Other | Source: Ambulatory Visit | Attending: Family Medicine | Admitting: Family Medicine

## 2013-12-01 ENCOUNTER — Other Ambulatory Visit (HOSPITAL_COMMUNITY)
Admission: RE | Admit: 2013-12-01 | Discharge: 2013-12-01 | Disposition: A | Discharge status: Home / Self Care | Payer: Medicare Other | Source: Ambulatory Visit | Attending: Interventional Radiology | Admitting: Interventional Radiology

## 2013-12-01 DIAGNOSIS — E041 Nontoxic single thyroid nodule: Secondary | ICD-10-CM

## 2013-12-01 HISTORY — PX: BIOPSY THYROID: PRO38

## 2013-12-07 ENCOUNTER — Other Ambulatory Visit: Payer: Self-pay

## 2013-12-07 DIAGNOSIS — Z1231 Encounter for screening mammogram for malignant neoplasm of breast: Secondary | ICD-10-CM

## 2013-12-09 ENCOUNTER — Ambulatory Visit (INDEPENDENT_AMBULATORY_CARE_PROVIDER_SITE_OTHER): Payer: Medicare Other | Admitting: Internal Medicine

## 2013-12-09 ENCOUNTER — Encounter: Payer: Self-pay | Admitting: Internal Medicine

## 2013-12-09 VITALS — BP 140/118 | HR 75 | Temp 97.5°F | Resp 16 | Ht 65.5 in | Wt 262.6 lb

## 2013-12-09 DIAGNOSIS — E041 Nontoxic single thyroid nodule: Secondary | ICD-10-CM

## 2013-12-09 NOTE — Patient Instructions (Signed)
Please call me in 6 months (05/2013) to schedule a new thyroid biopsy. Please return in 1 year.

## 2013-12-09 NOTE — Progress Notes (Addendum)
Patient ID: Kristin Hood, female   DOB: 10-Oct-1940, 73 y.o.   MRN: [id number]   HPI  Kristin Hood is a 73 y.o.-year-old female, referred by her PCP, Dr. Rex Kras, in consultation for a R thyroid nodule.  She described having L-sided chest pressure (11/11/2013) >> went to the ED: found to be in A fib. Also had a Chest CT: Mediastinal and retroperitoneal lymph nodes, so a PET scan was recommended: a thyroid nodule appeared hypermetabolic.   PET scan (40/98/1191): hypermetabolic R thyroid nodule  Thyroid U/S (11/21/2013): R 2.7 x 1.8 cm nodule, heterogeneous, with coarse calcifications  R nodule Bx (12/01/2013): FLUS  No available thyroid tests to review - we called PCP's office for records.  Pt denies feeling nodules in neck, hoarseness, dysphagia/odynophagia, SOB with lying down.  Pt c/o: - + tremors (hereditary) - since a teenager - no heat intolerance/cold intolerance - no palpitations - no anxiety/no depression - no hyperdefecation/no constipation - no weight loss/no gain - no dry skin - no hair falling - no fatigue  Pt does have a FH of thyroid ds.: mother (hypothyroidism). No FH of thyroid cancer. No h/o radiation tx to head or neck. She had ChTx for her BrCA - dx 2000.  No seaweed or kelp, + recent contrast studies CT chest 11/12/2013. No steroid use. No herbal supplements.   ROS: Constitutional: + see HPI Eyes: no blurry vision, no xerophthalmia ENT: no sore throat, no nodules palpated in throat, no dysphagia/odynophagia, no hoarseness Cardiovascular: no CP/+ exertional SOB/no palpitations/leg swelling Respiratory: no cough/+ exertional SOB Gastrointestinal: no N/V/D/C Musculoskeletal: no muscle/+ joint aches Skin: no rashes Neurological: + tremors/no numbness/tingling/dizziness Psychiatric: no depression/anxiety  Past Medical History  Diagnosis Date  . Hypertension     , With mild concentric left ventricular hypertrophy  . Stroke   . Shingles   . Atrial  fibrillation 07/2012    Eliquis, rate controlled  . Hx of adenomatous colonic polyps 2006    , Benign  . Mixed dyslipidemia   . Coarse tremors     , Essential  . CVA (cerebral vascular accident) 2013  . Hypercholesteremia   . Breast cancer 2001    left mastectomy   Past Surgical History  Procedure Laterality Date  . Mastectomy      Radical, left   . Tee without cardioversion  11/19/2011    Procedure: TRANSESOPHAGEAL ECHOCARDIOGRAM (TEE);  Surgeon: [name], MD;  Location: [facility] ENDOSCOPY;  Service: Cardiovascular;  Laterality: N/A;  . Knee surgery  2009    ,Total knee replacement  . Bunionectomy    . Cardiac catheterization  07/2010    , Without significant CAD  . Left rotator cuff    . Appendectomy    . Cesarean section    . Cardiac catheterization  07/2011    [name]   History   Social History  . Marital Status: Married    Spouse Name: N/A    Number of Children: 2   Occupational History  . homemaker   Social History Main Topics  . Smoking status: Former Smoker -- 2.50 packs/day for 13 years    Types: Cigarettes    Quit date: 01/30/1992  . Smokeless tobacco: Never Used  . Alcohol Use:     3 drink(s) per week  . Drug Use: No   Current Outpatient Prescriptions on File Prior to Visit  Medication Sig Dispense Refill  . apixaban (ELIQUIS) 5 MG TABS tablet Take 5 mg by mouth 2 (two) times  daily.      . atorvastatin (LIPITOR) 10 MG tablet Take 10 mg by mouth daily.       . fenofibrate 160 MG tablet Take 160 mg by mouth daily.       . ferrous sulfate 325 (65 FE) MG EC tablet Take 325 mg by mouth daily.      Marland Kitchen losartan (COZAAR) 100 MG tablet Take 100 mg by mouth daily.      . Multiple Vitamin (MULTIVITAMIN) tablet Take 1 tablet by mouth daily. Calcium 300 mg      . Omega-3 Fatty Acids (FISH OIL PO) Take 1,960 mg by mouth 2 (two) times daily.       . propranolol (INDERAL) 60 MG tablet Take 60-120 mg by mouth 3 (three) times daily. Take 2 tablets in the morning, 1 in  the afternoon, and 1 in the evening.      Marland Kitchen amoxicillin (AMOXIL) 500 MG capsule Take 2,000 mg by mouth once. 1 hour prior to dental appointments       No current facility-administered medications on file prior to visit.   Allergies  Allergen Reactions  . Floxin [Ofloxacin] Nausea Only and Other (See Comments)    Dizziness, Vertigo   Family History  Problem Relation Age of Onset  . Heart disease Mother   . Heart disease Father    PE: BP 140/118  Pulse 75  Temp(Src) 97.5 F (36.4 C) (Oral)  Resp 16  Ht 5' 5.5" (1.664 m)  Wt 262 lb 9.6 oz (119.115 kg)  BMI 43.02 kg/m2  SpO2 98% Wt Readings from Last 3 Encounters:  12/09/13 262 lb 9.6 oz (119.115 kg)  11/25/13 259 lb (117.482 kg)  08/03/13 259 lb (117.482 kg)   Constitutional: overweight, in NAD Eyes: PERRLA, EOMI, no exophthalmos ENT: moist mucous membranes, no thyromegaly, no nodule palpatedno cervical lymphadenopathy Cardiovascular: RRR, No MRG Respiratory: CTA B Gastrointestinal: abdomen soft, NT, ND, BS+ Musculoskeletal: no deformities, strength intact in all 4;  Skin: moist, warm, no rashes Neurological: + tremor with outstretched hands, DTR 0/4 in B knees (previous sx)  ASSESSMENT: 1. R thyroid nodule, hypermetabolic on PET, FLUS on Bx  PLAN: 1. Thyroid nodule  - I reviewed the images of her thyroid ultrasound along with the patient. I pointed out that the dominant nodule is large, this being a risk factor for cancer. It appears to have calcifications, without internal blood flow, more wide than tall. Pt does not have a thyroid cancer family history or a personal history of RxTx to head/neck. All these would favor benignity.  - we discussed that FLUS is one of the undeterminate Bx results on the Bethesda criteria >> we will need to repeat the Bx in 6 months from now. She agrees with the plan  - I explained that this is not cancer, we can continue to follow her on a yearly basis, and check another ultrasound in  another year or 2. - she should let me know if she develops neck compression symptoms, in that case, we might need to do either lobectomy or thyroidectomy - I'll see her back in a year, assuming her FNA is normal. If FNA abnormal, we will meet sooner.  - she had thyroid tests by PCP recently >> we could not obtain the results during her appt, but we asked for them  Records from PCP arrived after pt left: TSH (11/30/2013): 6.84 (0.34-4.5). She will need a new TSH checked either at next visit with PCP or when she  comes for the thyroid Bx in 6 mo.   07/11/2014: FNA: FLUS  07/31/2014 Called and d/w pt about the results of the Afirma test, which is "suspicious for neoplasm" (test results scanned under Media tab). This carries a risk of 75% for cancer. In this case, I suggested total thyroidectomy. Patient agrees with the plan, but would like to wait until our next appointment in 11/2014, since she has a total knee surgery in 09/2014. This is okay. We will probably need clearance for the surgery.

## 2014-01-13 ENCOUNTER — Other Ambulatory Visit: Payer: Self-pay

## 2014-01-16 ENCOUNTER — Ambulatory Visit
Admission: RE | Admit: 2014-01-16 | Discharge: 2014-01-16 | Disposition: A | Discharge status: Home / Self Care | Payer: Medicare Other | Source: Ambulatory Visit

## 2014-01-16 DIAGNOSIS — Z1231 Encounter for screening mammogram for malignant neoplasm of breast: Secondary | ICD-10-CM

## 2014-02-13 ENCOUNTER — Encounter: Payer: Self-pay | Admitting: Cardiology

## 2014-02-13 ENCOUNTER — Ambulatory Visit (INDEPENDENT_AMBULATORY_CARE_PROVIDER_SITE_OTHER): Payer: Medicare Other | Admitting: Cardiology

## 2014-02-13 VITALS — BP 122/76 | HR 66 | Ht 66.0 in | Wt 260.0 lb

## 2014-02-13 DIAGNOSIS — I481 Persistent atrial fibrillation: Secondary | ICD-10-CM

## 2014-02-13 DIAGNOSIS — E785 Hyperlipidemia, unspecified: Secondary | ICD-10-CM

## 2014-02-13 DIAGNOSIS — I4819 Other persistent atrial fibrillation: Secondary | ICD-10-CM

## 2014-02-13 DIAGNOSIS — I5032 Chronic diastolic (congestive) heart failure: Secondary | ICD-10-CM | POA: Insufficient documentation

## 2014-02-13 DIAGNOSIS — Z7901 Long term (current) use of anticoagulants: Secondary | ICD-10-CM

## 2014-02-13 HISTORY — DX: Chronic diastolic (congestive) heart failure: I50.32

## 2014-02-13 HISTORY — DX: Hyperlipidemia, unspecified: E78.5

## 2014-02-13 NOTE — Progress Notes (Signed)
Patient ID: Kristin Hood, female   DOB: 02/06/1941, 73 y.o.   MRN: [id number]      [address], [address] [place], [place]  [address] Phone: [phone] Fax:  [phone]  Date:  02/13/2014   ID:  Kristin Hood, DOB 08/28/1940, MRN [id number]  PCP:  Gennette Pac, MD   History of Present Illness: Kristin Hood is a 73 y.o. female with atrial fibrillation discovered on 08/20/12, chronic anticoagulation, prior stroke, received TPA for cerebral artery occlusion (could not raise arm-8/13) here for followup.  Echocardiogram demonstrated mild to moderate left atrial enlargement, normal ejection fraction, mild aortic stenosis, MAC, mild mitral regurgitation.  Previously she did have some associated dyspnea on exertion with no chest pain, previously described as moderate in severity relieved with rest with no radiation of symptoms.  No associated angina. She had stress test which showed inferior ischemia, transient ischemic dilatation with normal ejection fraction. Does water exercises. Had 3 knee replacements. 25 min of stair steppers. She only went 2 minutes on the prior exercise treadmill test before significant shortness of breath ensued.   She underwent catheterization on 08/14/11 which showed no evidence of coronary artery disease. LVEDP was 14 mm mercury. This was after possible inferior wall ischemia as well as 3 times a day was seen on nuclear stress test.  Went to ER on 12/10/12 had a terrific pain in her chest. Lasted about hour at home BP 159/100, 3 times. Went on to ER. Was worried. Once she got to [facility] she quit hurting. Sent to ER. Wanted to keep her overnight. Did troponin did well. Did OK. Prob GERD.   Went again to [facility]. Afib increased rate. PET scan. 3 nodules one thyroid. Endocrine, no good sample.  Recently has been undergoing bronchitis. She is currently on antibiotic. Productive cough. Atrial fibrillation has been under good control.  Unfortunately,  her son-in-law died 2 weeks after neck surgery from pulmonary embolism.  Wt Readings from Last 3 Encounters:  02/13/14 260 lb (117.935 kg)  12/09/13 262 lb 9.6 oz (119.115 kg)  11/25/13 259 lb (117.482 kg)     Past Medical History  Diagnosis Date  . Hypertension     , With mild concentric left ventricular hypertrophy  . Stroke   . Shingles   . Atrial fibrillation 07/2012    Eliquis, rate controlled  . Hx of adenomatous colonic polyps 2006    , Benign  . Mixed dyslipidemia   . Coarse tremors     , Essential  . CVA (cerebral vascular accident) 2013  . Hypercholesteremia   . Breast cancer 2001    left mastectomy    Past Surgical History  Procedure Laterality Date  . Mastectomy      Radical, left   . Tee without cardioversion  11/19/2011    Procedure: TRANSESOPHAGEAL ECHOCARDIOGRAM (TEE);  Surgeon: [name], MD;  Location: [facility] ENDOSCOPY;  Service: Cardiovascular;  Laterality: N/A;  . Knee surgery  2009    ,Total knee replacement  . Bunionectomy    . Cardiac catheterization  07/2010    , Without significant CAD  . Left rotator cuff    . Appendectomy    . Cesarean section    . Cardiac catheterization  07/2011    [name]    Current Outpatient Prescriptions  Medication Sig Dispense Refill  . amLODipine (NORVASC) 5 MG tablet Take 5 mg by mouth daily.    Marland Kitchen amoxicillin (AMOXIL) 500 MG capsule Take  2,000 mg by mouth once. 1 hour prior to dental appointments    . apixaban (ELIQUIS) 5 MG TABS tablet Take 5 mg by mouth 2 (two) times daily.    Marland Kitchen atorvastatin (LIPITOR) 10 MG tablet Take 10 mg by mouth daily.     . fenofibrate 160 MG tablet Take 160 mg by mouth daily.     . ferrous sulfate 325 (65 FE) MG EC tablet Take 325 mg by mouth daily.    Marland Kitchen losartan (COZAAR) 100 MG tablet Take 100 mg by mouth daily.    . Multiple Vitamin (MULTIVITAMIN) tablet Take 1 tablet by mouth daily. Calcium 300 mg    . Omega-3 Fatty Acids (FISH OIL PO) Take 1,960 mg by mouth 2 (two) times daily.      . propranolol (INDERAL) 60 MG tablet Take 60-120 mg by mouth 3 (three) times daily. Take 2 tablets in the morning, 1 in the afternoon, and 1 in the evening.     No current facility-administered medications for this visit.    Allergies:    Allergies  Allergen Reactions  . Floxin [Ofloxacin] Nausea Only and Other (See Comments)    Dizziness, Vertigo    Social History:  The patient  reports that she quit smoking about 22 years ago. Her smoking use included Cigarettes. She has a 32.5 pack-year smoking history. She has never used smokeless tobacco. She reports that she drinks about 0.5 oz of alcohol per week. She reports that she does not use illicit drugs.   ROS:  Please see the history of present illness.   No syncope, no bleeding, no orthopnea, no PND, no further strokelike symptoms   All other systems reviewed and negative.   PHYSICAL EXAM: VS:  BP 122/76 mmHg  Pulse 66  Ht 5\' 6"  (1.676 m)  Wt 260 lb (117.935 kg)  BMI 41.99 kg/m2 Well nourished, well developed, in no acute distress HEENT: normal Neck: no JVD Cardiac:  irreg normal rate S1, S2; RRR; no murmurdistant heart sounds Lungs:  clear to auscultation bilaterally, no wheezing, rhonchi or rales Abd: soft, nontender, no hepatomegalyobese Ext: no edema Skin: warm and dry Neuro: no focal abnormalities noted  tremor  EKG:  AFIB 77 No ST changes.  ASSESSMENT AND PLAN:  72 year old with atrial fibrillation, prior stroke on chronic anticoagulation with Eliquis here for followup.  1. Atrial fibrillation, persistent- Currently on Inderal, rate control. If she has a rapid ventricular response episode, I've instructed her to take an extra Inderal tablet. Let me know. 2. Hyperlipidemia-currently on Lipitor 10 mg. Dr. Rex Kras has been monitoring lipids 3. Chronic anticoagulation-no bleeding. Continue to monitor CBC/creatinine every 6 months. Creatinine 0.69 and hemoglobin 13.4 on 11/12/13. Samples have been given of Eliquis. 4. Chest  pain - GERD. Doing well. Prior Cath reassuring.  5. Obesity-continue to encourage weight loss 6. Chronic diastolic heart failure-shortness of breath. May be playing a role. Also prior smoking history as well. proBNP was 1893 during last hospitalization. BNP could also be elevated in the setting of obesity. 7. HTN - excellent blood pressure today. Medications reviewed. She showed me a list of blood pressure recordings that were labile at one point. Overall, we will make no changes at this time. 8. Returning to clinic in 1 year.  Signed, [name], MD [facility]  02/13/2014 11:20 AM

## 2014-02-13 NOTE — Patient Instructions (Signed)
The current medical regimen is effective;  continue present plan and medications.  Follow up in 1 year with Dr. Skains.  You will receive a letter in the mail 2 months before you are due.  Please call us when you receive this letter to schedule your follow up appointment.  Thank you for choosing [facility]!!     

## 2014-02-20 ENCOUNTER — Other Ambulatory Visit: Payer: Self-pay | Admitting: Neurology

## 2014-02-22 ENCOUNTER — Telehealth: Payer: Self-pay | Admitting: Neurology

## 2014-02-22 ENCOUNTER — Telehealth: Payer: Self-pay | Admitting: Cardiology

## 2014-02-22 MED ORDER — PROPRANOLOL HCL 60 MG PO TABS: 60.0000 mg | ORAL_TABLET | Freq: Three times a day (TID) | ORAL | Status: DC

## 2014-02-22 NOTE — Telephone Encounter (Signed)
New Msg   Please contact [phone] in regards to getting a prescription written for propranolol 60 mgs.  Patient states [name] didn't write the prescription but wants him to be the one to write the prescription. States the ordering provider will not be able to see her until after January. Patient and husband insist on the nurse receiving the msg and checking with [name].

## 2014-03-09 ENCOUNTER — Encounter (HOSPITAL_COMMUNITY): Payer: Self-pay | Admitting: Cardiology

## 2014-03-29 ENCOUNTER — Other Ambulatory Visit: Payer: Self-pay | Admitting: *Deleted

## 2014-03-29 MED ORDER — APIXABAN 5 MG PO TABS: 5.0000 mg | ORAL_TABLET | Freq: Two times a day (BID) | ORAL | Status: DC

## 2014-03-31 ENCOUNTER — Other Ambulatory Visit: Payer: Self-pay | Admitting: Neurology

## 2014-04-03 ENCOUNTER — Other Ambulatory Visit: Payer: Self-pay | Admitting: Neurology

## 2014-04-04 ENCOUNTER — Telehealth: Payer: Self-pay | Admitting: Neurology

## 2014-04-04 MED ORDER — PROPRANOLOL HCL 60 MG PO TABS: ORAL_TABLET | ORAL | Status: DC

## 2014-04-04 NOTE — Telephone Encounter (Signed)
Rx has been sent.  I called the patient back.  She is aware.  

## 2014-04-04 NOTE — Telephone Encounter (Signed)
Patient requesting Rx refill forpropranolol (INDERAL) 60 MG tablet.  She's requesting enough to last untill next appointment 06/16/14.  Please forward request to [facility].

## 2014-04-10 ENCOUNTER — Other Ambulatory Visit: Payer: Self-pay | Admitting: Orthopedic Surgery

## 2014-04-10 DIAGNOSIS — M25552 Pain in left hip: Secondary | ICD-10-CM

## 2014-04-10 DIAGNOSIS — M1612 Unilateral primary osteoarthritis, left hip: Secondary | ICD-10-CM

## 2014-04-20 ENCOUNTER — Encounter: Payer: Self-pay | Admitting: Cardiology

## 2014-04-24 ENCOUNTER — Other Ambulatory Visit: Payer: Self-pay | Admitting: Cardiology

## 2014-04-25 ENCOUNTER — Ambulatory Visit
Admission: RE | Admit: 2014-04-25 | Discharge: 2014-04-25 | Disposition: A | Discharge status: Home / Self Care | Payer: Medicare Other | Source: Ambulatory Visit | Attending: Orthopedic Surgery | Admitting: Orthopedic Surgery

## 2014-04-25 DIAGNOSIS — M1612 Unilateral primary osteoarthritis, left hip: Secondary | ICD-10-CM

## 2014-04-25 DIAGNOSIS — M25552 Pain in left hip: Secondary | ICD-10-CM

## 2014-04-25 MED ORDER — GADOBENATE DIMEGLUMINE 529 MG/ML IV SOLN
20.0000 mL | Freq: Once | INTRAVENOUS | Status: AC | PRN
  Administered 2014-04-25: 20 mL via INTRAVENOUS

## 2014-06-01 ENCOUNTER — Telehealth: Payer: Self-pay | Admitting: Cardiology

## 2014-06-01 NOTE — Telephone Encounter (Signed)
Received request from Nurse fax box, documents faxed for surgical clearance. To: [employer] Fax number: [phone] Attention: 3.3.16/km

## 2014-06-15 ENCOUNTER — Telehealth: Payer: Self-pay | Admitting: Cardiology

## 2014-06-15 ENCOUNTER — Other Ambulatory Visit: Payer: Self-pay | Admitting: Family Medicine

## 2014-06-15 DIAGNOSIS — R942 Abnormal results of pulmonary function studies: Secondary | ICD-10-CM

## 2014-06-15 NOTE — Telephone Encounter (Signed)
Reviewed information with pt.  She is requesting to be seen for surgical clearance.  An appt has been scheduled at her request for 07/31/2014 at 1:45  She is agreeable.

## 2014-06-15 NOTE — Telephone Encounter (Signed)
Request for surgical clearance:  1. What type of surgery is being performed? Hip replacement  2. When is this surgery scheduled? 10/11/14  3. Are there any medications that need to be held prior to surgery and how long? Pt thinks Eliquis but doesn't know how long  4. Name of physician performing surgery? Dr. Marolyn Hammock w/ [facility]  5. What is your office phone and fax number? Abigail Butts [employer]) Phone: [phone]  Pt calling stating that she needs surgical clearance and was told that she needs to have an ekg prior to surgery. Please call back and advise.

## 2014-06-16 ENCOUNTER — Ambulatory Visit (INDEPENDENT_AMBULATORY_CARE_PROVIDER_SITE_OTHER): Payer: Medicare Other | Admitting: Neurology

## 2014-06-16 VITALS — BP 134/78 | HR 71 | Ht 65.75 in | Wt 255.2 lb

## 2014-06-16 DIAGNOSIS — I6340 Cerebral infarction due to embolism of unspecified cerebral artery: Secondary | ICD-10-CM | POA: Diagnosis not present

## 2014-06-16 NOTE — Progress Notes (Signed)
[facility] [address] [place]. [place] [address] [phone]       OFFICE FOLLOW-UP NOTE  Kristin Hood Date of Birth:  02-03-1941 Medical Record Number:  [id number]   HPI: Kristin Hood is a 74 year old Caucasian lady with history of left MCA branch infarct in August 2013 with vascular risk factors of hypertension and hyperlipidemia. 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 She returns for followup her last visit on 03/12/2012. She continues to do well from the stroke standpoint and has not had any recurrent stroke or TIA symptoms. She is tolerating clopidogrel quite well without significant bleeding or bruising. She states her blood pressure is quite well controlled and today it is 130/80 in our office. She eats healthy and exercises regularly and her weight has been quite stable. She plans to have followup lipid profile checked in her primary physician's office next month. She continues to have left hand and voice tremor intermittently and has been taking Inderal 60 mg 2 tablets in the morning one in the afternoon and 1 he had night with modest benefit. She does have a very strong family history of tremors on her father's side. Update 06/16/2014 : She returns for follow-up after last visit in April 2014. She missed her yearly follow-up appointment and had trouble rescheduling. She ran out of Inderal but managed to get her cardiologist Dr. Gypsy Balsam to prescribe it. She continues to have voice and intermittent hand tremor which is mainly worse when she is emotionally upset or tired. She is currently taking Inderal LA 120 twice daily and tolerating it well without side effects. She was found to have paroxysmal atrial fibrillation in May 2014 by a cardiologist and change from Plavix to eliquis. She is tolerating it well without bleeding or bruising. She is planning on having left hip replacement  in July this year. She has not had any recurrent stroke or TIA symptoms. She has not had a carotid Doppler for more than a year. She states her lipids under good control though I do not have the latest results. ROS:   14 system review of systems is positive for runny nose, hand tremor,, voice tremor, shortness of breath, back pain and all other systems negative PMH:  Past Medical History  Diagnosis Date  . Hypertension     , With mild concentric left ventricular hypertrophy  . Stroke   . Shingles   . Atrial fibrillation 07/2012    Eliquis, rate controlled  . Hx of adenomatous colonic polyps 2006    , Benign  . Mixed dyslipidemia   . Coarse tremors     , Essential  . CVA (cerebral vascular accident) 2013  . Hypercholesteremia   . Breast cancer 2001    left mastectomy    Social History:  History   Social History  . Marital Status: Married    Spouse Name: N/A  . Number of Children: N/A  . Years of Education: N/A   Occupational History  . Not on file.   Social History Main Topics  . Smoking status: Former Smoker -- 2.50 packs/day for 13 years    Types: Cigarettes    Quit date: 01/30/1992  . Smokeless tobacco: Never Used  . Alcohol Use: 0.5 oz/week    1 drink(s) per week  . Drug Use: No  . Sexual Activity: Not on file   Other Topics Concern  . Not on file  Social History Narrative    Medications:   Current Outpatient Prescriptions on File Prior to Visit  Medication Sig Dispense Refill  . amLODipine (NORVASC) 5 MG tablet Take 5 mg by mouth daily.    Marland Kitchen amoxicillin (AMOXIL) 500 MG capsule Take 2,000 mg by mouth once. 1 hour prior to dental appointments    . atorvastatin (LIPITOR) 10 MG tablet Take 10 mg by mouth daily.     Marland Kitchen ELIQUIS 5 MG TABS tablet Take 1 tablet by mouth two  times daily 180 tablet 1  . fenofibrate 160 MG tablet Take 160 mg by mouth daily.     . ferrous sulfate 325 (65 FE) MG EC tablet Take 325 mg by mouth daily.    Marland Kitchen losartan (COZAAR) 100 MG  tablet Take 100 mg by mouth daily.    . Multiple Vitamin (MULTIVITAMIN) tablet Take 1 tablet by mouth daily. Calcium 300 mg    . Omega-3 Fatty Acids (FISH OIL PO) Take 980 mg by mouth 2 (two) times daily.     . propranolol (INDERAL) 60 MG tablet Take 2 tablets in the morning, 1 in the afternoon, and 1 in the evening. 360 tablet 0   No current facility-administered medications on file prior to visit.    Allergies:   Allergies  Allergen Reactions  . Floxin [Ofloxacin] Nausea Only and Other (See Comments)    Dizziness, Vertigo    Physical Exam General: well developed, well nourished elderly Caucasian lady, seated, in no evident distress Head: head normocephalic and atraumatic. Orohparynx benign Neck: supple with no carotid or supraclavicular bruits Cardiovascular: regular rate and rhythm, no murmurs Musculoskeletal: no deformity Skin:  no rash/petichiae Vascular:  Normal pulses all extremities  Neurologic Exam Mental Status: Awake and fully alert. Oriented to place and time. Recent and remote memory intact. Attention span, concentration and fund of knowledge appropriate. Mood and affect appropriate.  Cranial Nerves: Fundoscopic exam  Not done  . Pupils equal, briskly reactive to light. Extraocular movements full without nystagmus. Visual fields full to confrontation. Hearing intact. Facial sensation intact. Face, tongue, palate moves normally and symmetrically.  Motor: Normal bulk and tone. Normal strength in all tested extremity muscles. Mild left upper extremity action tremor and intermittent voice tremor Sensory.: intact to tough and pinprick and vibratory.  Coordination: Rapid alternating movements normal in all extremities. Finger-to-nose and heel-to-shin performed accurately bilaterally. Gait and Station: Arises from chair without difficulty. Stance is normal. Gait demonstrates normal stride length and balance . Able to heel, toe and tandem walk without difficulty.  Reflexes: 1+ and  symmetric. Toes downgoing.     ASSESSMENT:  74 year old lady with embolic left MCA branch infarct in August 2013 with vascular risk factors of hypertension and hyperlipidemia. She also has benign essential left hand and voice tremor which has shown modest response to Inderal   PLAN: I had a long d/w patient about her remote stroke, risk for recurrent stroke/TIAs, personally independently reviewed imaging studies and stroke evaluation results and answered questions.Continue Apixanan  for secondary stroke prevention  Given h/o atrial fibrillation and maintain strict control of hypertension with blood pressure goal below 130/90, diabetes with hemoglobin A1c goal below 6.5% and lipids with LDL cholesterol goal below 100 mg/dL. I also advised the patient to eat a healthy diet with plenty of whole grains, cereals, fruits and vegetables, exercise regularly and maintain ideal body weight .check screening carotid ultrasound study. Continue Inderal LA 120 mg twice daily for her essential voice and hand tremor. Followup  in the future with me in  1 year or call earlier if necessary.  Antony Contras, MD

## 2014-06-16 NOTE — Patient Instructions (Signed)
I had a long d/w patient about her remote stroke, risk for recurrent stroke/TIAs, personally independently reviewed imaging studies and stroke evaluation results and answered questions.Continue Apixanan  for secondary stroke prevention  Given h/o atrial fibrillation and maintain strict control of hypertension with blood pressure goal below 130/90, diabetes with hemoglobin A1c goal below 6.5% and lipids with LDL cholesterol goal below 100 mg/dL. I also advised the patient to eat a healthy diet with plenty of whole grains, cereals, fruits and vegetables, exercise regularly and maintain ideal body weight .check screening carotid ultrasound study. Continue Inderal LA 120 mg twice daily for her essential voice and hand tremor. Followup in the future with me in  1 year or call earlier if necessary.

## 2014-06-21 ENCOUNTER — Ambulatory Visit
Admission: RE | Admit: 2014-06-21 | Discharge: 2014-06-21 | Disposition: A | Discharge status: Home / Self Care | Payer: Medicare Other | Source: Ambulatory Visit | Attending: Family Medicine | Admitting: Family Medicine

## 2014-06-21 DIAGNOSIS — R942 Abnormal results of pulmonary function studies: Secondary | ICD-10-CM

## 2014-06-21 MED ORDER — IOPAMIDOL (ISOVUE-300) INJECTION 61%
125.0000 mL | Freq: Once | INTRAVENOUS | Status: AC | PRN
  Administered 2014-06-21: 125 mL via INTRAVENOUS

## 2014-06-22 ENCOUNTER — Encounter: Payer: Self-pay | Admitting: Internal Medicine

## 2014-06-22 ENCOUNTER — Telehealth: Payer: Self-pay | Admitting: Internal Medicine

## 2014-06-22 ENCOUNTER — Other Ambulatory Visit: Payer: Self-pay | Admitting: Internal Medicine

## 2014-06-22 DIAGNOSIS — E041 Nontoxic single thyroid nodule: Secondary | ICD-10-CM

## 2014-06-22 NOTE — Telephone Encounter (Signed)
CALLED PT AND SCHEDULED APPT WITH DR [facility]    MOHAMED 07/17/14 1:30 PM  DX: SPLENOMEGALY/LYMPHADENOPATHY REFERRING: DR. LITTLE

## 2014-06-26 ENCOUNTER — Encounter: Payer: Self-pay | Admitting: Cardiology

## 2014-06-27 ENCOUNTER — Encounter: Payer: Self-pay | Admitting: Cardiology

## 2014-06-30 ENCOUNTER — Telehealth: Payer: Self-pay | Admitting: Cardiology

## 2014-06-30 NOTE — Telephone Encounter (Signed)
Request for surgical clearance:  1. What type of surgery is being performed? Ultrasound thyroid [place]   2. When is this surgery scheduled? 07/04/2014  3.   Are there any medications that need to be held prior to surgery and how long? Eliquis for 48 hours 4.  Name of physician performing surgery? One of their radiologist  5.   What is your office phone and fax number? Fax # [phone]

## 2014-06-30 NOTE — Telephone Encounter (Signed)
This has been addressed and fax X 2 now.  Spoke with [facility] who states they have not received it.  I verified fax # and will fax information again.  She thanked me for my time and assistance.

## 2014-07-11 ENCOUNTER — Ambulatory Visit
Admission: RE | Admit: 2014-07-11 | Discharge: 2014-07-11 | Disposition: A | Discharge status: Home / Self Care | Payer: Medicare Other | Source: Ambulatory Visit | Attending: Internal Medicine | Admitting: Internal Medicine

## 2014-07-11 ENCOUNTER — Other Ambulatory Visit (HOSPITAL_COMMUNITY)
Admission: RE | Admit: 2014-07-11 | Discharge: 2014-07-11 | Disposition: A | Discharge status: Home / Self Care | Payer: Medicare Other | Source: Ambulatory Visit | Attending: Interventional Radiology | Admitting: Interventional Radiology

## 2014-07-11 DIAGNOSIS — E041 Nontoxic single thyroid nodule: Secondary | ICD-10-CM

## 2014-07-12 ENCOUNTER — Ambulatory Visit (INDEPENDENT_AMBULATORY_CARE_PROVIDER_SITE_OTHER): Payer: Medicare Other

## 2014-07-12 DIAGNOSIS — I6340 Cerebral infarction due to embolism of unspecified cerebral artery: Secondary | ICD-10-CM | POA: Diagnosis not present

## 2014-07-14 ENCOUNTER — Other Ambulatory Visit: Payer: Self-pay | Admitting: Medical Oncology

## 2014-07-14 DIAGNOSIS — R161 Splenomegaly, not elsewhere classified: Secondary | ICD-10-CM

## 2014-07-17 ENCOUNTER — Other Ambulatory Visit: Payer: Self-pay | Admitting: Internal Medicine

## 2014-07-17 ENCOUNTER — Ambulatory Visit (HOSPITAL_BASED_OUTPATIENT_CLINIC_OR_DEPARTMENT_OTHER): Payer: Medicare Other | Admitting: Internal Medicine

## 2014-07-17 ENCOUNTER — Encounter: Payer: Self-pay | Admitting: Internal Medicine

## 2014-07-17 ENCOUNTER — Other Ambulatory Visit (HOSPITAL_BASED_OUTPATIENT_CLINIC_OR_DEPARTMENT_OTHER): Payer: Medicare Other

## 2014-07-17 ENCOUNTER — Ambulatory Visit: Payer: Medicare Other

## 2014-07-17 ENCOUNTER — Telehealth: Payer: Self-pay | Admitting: Internal Medicine

## 2014-07-17 VITALS — BP 139/65 | HR 77 | Temp 97.9°F | Resp 20 | Ht 65.0 in | Wt 262.5 lb

## 2014-07-17 DIAGNOSIS — R161 Splenomegaly, not elsewhere classified: Secondary | ICD-10-CM | POA: Diagnosis not present

## 2014-07-17 DIAGNOSIS — R591 Generalized enlarged lymph nodes: Secondary | ICD-10-CM

## 2014-07-17 LAB — LACTATE DEHYDROGENASE (CC13): LDH: 208 U/L (ref 125–245)

## 2014-07-17 LAB — COMPREHENSIVE METABOLIC PANEL (CC13)
ALBUMIN: 3.7 g/dL (ref 3.5–5.0)
ALK PHOS: 41 U/L (ref 40–150)
ALT: 19 U/L (ref 0–55)
AST: 25 U/L (ref 5–34)
Anion Gap: 10 mEq/L (ref 3–11)
BUN: 22.1 mg/dL (ref 7.0–26.0)
CALCIUM: 9.9 mg/dL (ref 8.4–10.4)
CHLORIDE: 106 meq/L (ref 98–109)
CO2: 24 meq/L (ref 22–29)
Creatinine: 0.8 mg/dL (ref 0.6–1.1)
EGFR: 74 mL/min/{1.73_m2} — AB (ref >90)
GLUCOSE: 98 mg/dL (ref 70–140)
Potassium: 5 mEq/L (ref 3.5–5.1)
Sodium: 141 mEq/L (ref 136–145)
Total Bilirubin: 0.88 mg/dL (ref 0.20–1.20)
Total Protein: 7.6 g/dL (ref 6.4–8.3)

## 2014-07-17 LAB — CBC WITH DIFFERENTIAL/PLATELET
BASO%: 0.5 % (ref 0.0–2.0)
BASOS ABS: 0 10*3/uL (ref 0.0–0.1)
EOS%: 1.6 % (ref 0.0–7.0)
Eosinophils Absolute: 0.1 10*3/uL (ref 0.0–0.5)
HCT: 37.2 % (ref 34.8–46.6)
HEMOGLOBIN: 12.1 g/dL (ref 11.6–15.9)
LYMPH%: 15.3 % (ref 14.0–49.7)
MCH: 28.1 pg (ref 25.1–34.0)
MCHC: 32.5 g/dL (ref 31.5–36.0)
MCV: 86.4 fL (ref 79.5–101.0)
MONO#: 0.7 10*3/uL (ref 0.1–0.9)
MONO%: 13.9 % (ref 0.0–14.0)
NEUT#: 3.3 10*3/uL (ref 1.5–6.5)
NEUT%: 68.7 % (ref 38.4–76.8)
Platelets: 280 10*3/uL (ref 145–400)
RBC: 4.31 10*6/uL (ref 3.70–5.45)
RDW: 14.8 % — ABNORMAL HIGH (ref 11.2–14.5)
WBC: 4.8 10*3/uL (ref 3.9–10.3)
lymph#: 0.7 10*3/uL — ABNORMAL LOW (ref 0.9–3.3)

## 2014-07-17 NOTE — Progress Notes (Signed)
Checked in new pt with no financial concerns. °

## 2014-07-17 NOTE — Telephone Encounter (Signed)
Gave avs & calendar for April 2017. °

## 2014-07-17 NOTE — Progress Notes (Signed)
[place] [phone]   [phone]  CONSULT NOTE  REFERRING PHYSICIAN: Dr. [name]  REASON FOR CONSULTATION:  74 years old white female with lymphadenopathy and splenomegaly  HPI Kristin Hood is a 74 y.o. female with past medical history significant for multiple medical problems including history of hypertension, atrial fibrillation, stroke, colon polyps, dyslipidemia as well as history of breast cancer diagnosed in 2000 status post left mastectomy followed by hormonal treatment with tamoxifen for 5 years and Femara for another 5 years. 6 months ago the patient mentions that she had atrial fibrillation and anxiety. She was seen at the emergency department and CT angiogram of the chest was performed on 11/12/2013 and it showed mediastinal and retroperitoneal lymph nodes that were nonspecific and 1 with some for lymphoproliferative disorder. Granulomatous disease was also a possibility but less likely. The patient was seen by her primary care physician Dr. Rex Kras and a PET scan was performed on 11/17/2013 and it showed Shotty lymph nodes within the mediastinum, upper abdomen, and abdominal retroperitoneum show low-grade metabolic activity, which is nonspecific. Low-grade lymphoproliferative disorder cannot definitely be excluded. Recommend correlation for clinical or laboratory evidence of lymphoproliferative disorder, as well as followup by CT in 6 months. Small hypermetabolic right thyroid lobe nodule. Primary thyroid carcinoma cannot be excluded. Thyroid ultrasound is recommended for further evaluation.  The patient underwent ultrasound of the thyroid as well as fine needle aspiration of the thyroid and the final cytology showed atypia of undetermined significance or follicular lesion of undetermined significance.   Repeat CT scan of the chest, abdomen and pelvis performed on 06/21/2014 showed persistent lymphadenopathy in the mediastinum and upper  abdomen/retroperitoneum very similar to the prior PET-CT of 11/17/2013. In addition there was persistent splenomegaly and lymphoproliferative disorder could not be excluded. There was also 0.6 cm left upper lobe pulmonary nodule that has been stable since the previous CT scan of the chest on 11/13/2014 Dr. Rex Kras kindly referred the patient to me today for evaluation and recommendation regarding her persistent lymphadenopathy and splenomegaly. When seen today the patient is feeling fine with no specific complaints except for left hip pain and she is scheduled for left hip replacement in July 2016. She denied having any significant weight loss but has occasional night sweats. The patient denied having any chest pain, cough or hemoptysis but has shortness breath with exertion secondary to overweight. She has no significant nausea or vomiting, no fever or chills. Family history significant for a mother and father with heart disease. The patient is married and has 2 children. She was accompanied today by her husband Jeneen Rinks and her daughter [name]. She has a history of smoking for around 30 years and quit in 1993. She drinks alcohol occasionally and no history of drug abuse.  HPI  Past Medical History  Diagnosis Date  . Hypertension     , With mild concentric left ventricular hypertrophy  . Stroke   . Shingles   . Atrial fibrillation 07/2012    Eliquis, rate controlled  . Hx of adenomatous colonic polyps 2006    , Benign  . Mixed dyslipidemia   . Coarse tremors     , Essential  . CVA (cerebral vascular accident) 2013  . Hypercholesteremia   . Breast cancer 2001    left mastectomy    Past Surgical History  Procedure Laterality Date  . Mastectomy      Radical, left   . Tee without cardioversion  11/19/2011  Procedure: TRANSESOPHAGEAL ECHOCARDIOGRAM (TEE);  Surgeon: [name], MD;  Location: [facility] ENDOSCOPY;  Service: Cardiovascular;  Laterality: N/A;  . Knee surgery  2009    ,Total knee  replacement  . Bunionectomy    . Cardiac catheterization  07/2010    , Without significant CAD  . Left rotator cuff    . Appendectomy    . Cesarean section    . Cardiac catheterization  07/2011    [name]  . Left heart catheterization with coronary angiogram N/A 08/14/2011    Procedure: LEFT HEART CATHETERIZATION WITH CORONARY ANGIOGRAM;  Surgeon: [name], MD;  Location: [facility] CATH LAB;  Service: Cardiovascular;  Laterality: N/A;    Family History  Problem Relation Age of Onset  . Heart disease Mother   . Heart disease Father     Social History History  Substance Use Topics  . Smoking status: Former Smoker -- 2.50 packs/day for 13 years    Types: Cigarettes    Quit date: 01/30/1992  . Smokeless tobacco: Never Used  . Alcohol Use: 0.5 oz/week    1 Standard drinks or equivalent per week    Allergies  Allergen Reactions  . Floxin [Ofloxacin] Nausea Only and Other (See Comments)    Dizziness, Vertigo    Current Outpatient Prescriptions  Medication Sig Dispense Refill  . amLODipine (NORVASC) 5 MG tablet Take 5 mg by mouth daily.    Marland Kitchen amoxicillin (AMOXIL) 500 MG capsule Take 2,000 mg by mouth once. 1 hour prior to dental appointments    . atorvastatin (LIPITOR) 10 MG tablet Take 10 mg by mouth daily.     Marland Kitchen ELIQUIS 5 MG TABS tablet Take 1 tablet by mouth two  times daily 180 tablet 1  . fenofibrate 160 MG tablet Take 160 mg by mouth daily.     . ferrous sulfate 325 (65 FE) MG EC tablet Take 325 mg by mouth daily.    Marland Kitchen HYDROcodone-acetaminophen (NORCO/VICODIN) 5-325 MG per tablet Take 1 tablet by mouth as needed. Take 1 tablet by mouth every 6 to 8 hours as needed for pain  0  . ketoconazole (NIZORAL) 2 % cream Apply 1 application topically as needed.  0  . losartan (COZAAR) 100 MG tablet Take 100 mg by mouth daily.    . Multiple Vitamin (MULTIVITAMIN) tablet Take 1 tablet by mouth daily. Calcium 300 mg    . Omega-3 Fatty Acids (FISH OIL PO) Take 980 mg by mouth 2 (two)  times daily.     . propranolol (INDERAL) 60 MG tablet Take 2 tablets in the morning, 1 in the afternoon, and 1 in the evening. 360 tablet 0   No current facility-administered medications for this visit.    Review of Systems  Constitutional: negative Eyes: negative Ears, nose, mouth, throat, and face: negative Respiratory: positive for dyspnea on exertion Cardiovascular: negative Gastrointestinal: negative Genitourinary:negative Integument/breast: negative Hematologic/lymphatic: negative Musculoskeletal:positive for Left hip pain Neurological: negative Behavioral/Psych: negative Endocrine: negative Allergic/Immunologic: negative  Physical Exam  TXM:IWOEH, healthy, no distress, well nourished and well developed SKIN: skin color, texture, turgor are normal, no rashes or significant lesions HEAD: Normocephalic, No masses, lesions, tenderness or abnormalities EYES: normal, PERRLA EARS: External ears normal, Canals clear OROPHARYNX:no exudate, no erythema and lips, buccal mucosa, and tongue normal  NECK: supple, no adenopathy, no JVD LYMPH:  no palpable lymphadenopathy, no hepatosplenomegaly BREAST:not examined LUNGS: clear to auscultation , and palpation HEART: regular rate & rhythm and no murmurs ABDOMEN:abdomen soft, non-tender, obese, normal bowel sounds  and no masses or organomegaly BACK: Back symmetric, no curvature., No CVA tenderness EXTREMITIES:no joint deformities, effusion, or inflammation, no edema, no skin discoloration  NEURO: alert & oriented x 3 with fluent speech, no focal motor/sensory deficits  PERFORMANCE STATUS: ECOG 1  LABORATORY DATA: Lab Results  Component Value Date   WBC 4.8 07/17/2014   HGB 12.1 07/17/2014   HCT 37.2 07/17/2014   MCV 86.4 07/17/2014   PLT 280 07/17/2014      Chemistry      Component Value Date/Time   NA 141 11/12/2013 1152   K 4.1 11/12/2013 1152   CL 103 11/12/2013 1152   CO2 24 11/12/2013 1152   BUN 18 11/12/2013 1152    CREATININE 0.69 11/12/2013 1152      Component Value Date/Time   CALCIUM 10.2 11/12/2013 1152   ALKPHOS 43 11/17/2011 1837   AST 30 11/17/2011 1837   ALT 21 11/17/2011 1837   BILITOT 0.6 11/17/2011 1837       RADIOGRAPHIC STUDIES: Ct Chest W Contrast  06/21/2014   CLINICAL DATA:  Subsequent evaluation of a 74 year old female with chronic shortness of breath, and adenopathy noted on prior examinations. History of left-sided breast cancer in 2000 status post mastectomy, chemotherapy and oral chemotherapy for the past 10 years.  EXAM: CT CHEST AND ABDOMEN WITH CONTRAST  TECHNIQUE: Multidetector CT imaging of the chest and abdomen was performed following the standard protocol during bolus administration of intravenous contrast.  CONTRAST:  125 mL of Isovue-300.  COMPARISON:  PET-CT 11/17/2013.  FINDINGS: CT CHEST FINDINGS  Mediastinum/Lymph Nodes: Heart size is mildly enlarged with biatrial dilatation. There is no significant pericardial fluid, thickening or pericardial calcification. Severe calcifications of the mitral annulus. There is atherosclerosis of the thoracic aorta, the great vessels of the mediastinum and the coronary arteries, including calcified atherosclerotic plaque in the left main, left anterior descending, left circumflex and right coronary arteries. Multiple borderline enlarged and enlarged mediastinal and hilar lymph nodes are again noted, with the largest nodes measuring 21 mm in short axis in the low right paraesophageal nodal station, and 14 mm in short axis in the subcarinal nodal station, similar to prior examinations. Densely calcified right hilar lymph nodes also noted, similar to the prior examination. No axillary lymphadenopathy.  Lungs/Pleura: Mosaic attenuation in the lungs bilaterally, with areas of mild ground-glass attenuation interspersed with areas of lucency, with geographic margins, suggestive of widespread air trapping from small airways disease. 6 mm pulmonary  nodule in the left upper lobe (image 29 of series 4), unchanged in retrospect compared to prior study 11/12/2013. No other larger more suspicious appearing pulmonary nodules or masses are otherwise noted. No acute consolidative airspace disease. No pleural effusions.  Musculoskeletal/Soft Tissues: Status post left modified radical mastectomy and axillary nodal dissection. There are no aggressive appearing lytic or blastic lesions noted in the visualized portions of the skeleton.  CT ABDOMEN FINDINGS  Hepatobiliary: No cystic or solid hepatic lesions. No intra or extrahepatic biliary ductal dilatation. Multiple small gallstones lying dependently within the lumen of the gallbladder. No findings to suggest an acute cholecystitis at this time.  Pancreas: Unremarkable.  Spleen: The spleen is markedly enlarged measuring 6.7 x 15.4 x 17.0 cm (estimated splenic volume of 877 mL). Calcified granuloma in the lower pole of the spleen. Trace amount of subcapsular fluid along the inferior margin of the spleen.  Adrenals/Urinary Tract: Bilateral adrenal glands and bilateral kidneys are normal in appearance. No hydroureteronephrosis in the visualized abdomen.  Stomach/Bowel:  Normal appearance of the stomach. No pathologic dilatation of small bowel or colon in the visualized abdomen.  Vascular/Lymphatic: Extensive atherosclerosis throughout the abdominal vasculature, without evidence of aneurysm. Multiple borderline enlarged and mildly enlarged abdominal and retroperitoneal lymph nodes, largest of which measures 12 mm in short axis in the hepatoduodenal ligament, and 12 mm in short axis in the aortocaval nodal stations. These findings are similar to prior PET-CT.  Other: No significant volume of ascites in the visualized abdomen. No pneumoperitoneum.  Musculoskeletal: There are no aggressive appearing lytic or blastic lesions noted in the visualized portions of the skeleton.  IMPRESSION: 1. Persistence lymphadenopathy in the  mediastinum and upper abdomen/retroperitoneum, as above, very similar to prior PET-CT 11/17/2013. In addition, there is persistent splenomegaly. Clinical correlation to exclude underlying lymphoproliferative disorder is suggested. 2. Cholelithiasis without evidence of acute cholecystitis at this time. 3. Atherosclerosis, including left main and 3 vessel coronary artery disease. Assessment for potential risk factor modification, dietary therapy or pharmacologic therapy may be warranted, if clinically indicated. 4. 6 mm left upper lobe pulmonary nodule (image 29 of series 4), unchanged compared to prior chest CT 11/12/2013. This stability over the prior 7 month time interval is reassuring, but an additional followup noncontrast chest CT in 1 year is recommended at this time. This recommendation follows the consensus statement: Guidelines for Management of Small Pulmonary Nodules Detected on CT Scans: A Statement from the [place] as published in Radiology 2005;237:395-400. 5. Additional incidental findings, similar to prior examinations, as above.   Electronically Signed   By: Vinnie Langton M.D.   On: 06/21/2014 14:47   Ct Abdomen W Contrast  06/21/2014   CLINICAL DATA:  Subsequent evaluation of a 74 year old female with chronic shortness of breath, and adenopathy noted on prior examinations. History of left-sided breast cancer in 2000 status post mastectomy, chemotherapy and oral chemotherapy for the past 10 years.  EXAM: CT CHEST AND ABDOMEN WITH CONTRAST  TECHNIQUE: Multidetector CT imaging of the chest and abdomen was performed following the standard protocol during bolus administration of intravenous contrast.  CONTRAST:  125 mL of Isovue-300.  COMPARISON:  PET-CT 11/17/2013.  FINDINGS: CT CHEST FINDINGS  Mediastinum/Lymph Nodes: Heart size is mildly enlarged with biatrial dilatation. There is no significant pericardial fluid, thickening or pericardial calcification. Severe calcifications of the  mitral annulus. There is atherosclerosis of the thoracic aorta, the great vessels of the mediastinum and the coronary arteries, including calcified atherosclerotic plaque in the left main, left anterior descending, left circumflex and right coronary arteries. Multiple borderline enlarged and enlarged mediastinal and hilar lymph nodes are again noted, with the largest nodes measuring 21 mm in short axis in the low right paraesophageal nodal station, and 14 mm in short axis in the subcarinal nodal station, similar to prior examinations. Densely calcified right hilar lymph nodes also noted, similar to the prior examination. No axillary lymphadenopathy.  Lungs/Pleura: Mosaic attenuation in the lungs bilaterally, with areas of mild ground-glass attenuation interspersed with areas of lucency, with geographic margins, suggestive of widespread air trapping from small airways disease. 6 mm pulmonary nodule in the left upper lobe (image 29 of series 4), unchanged in retrospect compared to prior study 11/12/2013. No other larger more suspicious appearing pulmonary nodules or masses are otherwise noted. No acute consolidative airspace disease. No pleural effusions.  Musculoskeletal/Soft Tissues: Status post left modified radical mastectomy and axillary nodal dissection. There are no aggressive appearing lytic or blastic lesions noted in the visualized portions of the skeleton.  CT ABDOMEN FINDINGS  Hepatobiliary: No cystic or solid hepatic lesions. No intra or extrahepatic biliary ductal dilatation. Multiple small gallstones lying dependently within the lumen of the gallbladder. No findings to suggest an acute cholecystitis at this time.  Pancreas: Unremarkable.  Spleen: The spleen is markedly enlarged measuring 6.7 x 15.4 x 17.0 cm (estimated splenic volume of 877 mL). Calcified granuloma in the lower pole of the spleen. Trace amount of subcapsular fluid along the inferior margin of the spleen.  Adrenals/Urinary Tract:  Bilateral adrenal glands and bilateral kidneys are normal in appearance. No hydroureteronephrosis in the visualized abdomen.  Stomach/Bowel: Normal appearance of the stomach. No pathologic dilatation of small bowel or colon in the visualized abdomen.  Vascular/Lymphatic: Extensive atherosclerosis throughout the abdominal vasculature, without evidence of aneurysm. Multiple borderline enlarged and mildly enlarged abdominal and retroperitoneal lymph nodes, largest of which measures 12 mm in short axis in the hepatoduodenal ligament, and 12 mm in short axis in the aortocaval nodal stations. These findings are similar to prior PET-CT.  Other: No significant volume of ascites in the visualized abdomen. No pneumoperitoneum.  Musculoskeletal: There are no aggressive appearing lytic or blastic lesions noted in the visualized portions of the skeleton.  IMPRESSION: 1. Persistence lymphadenopathy in the mediastinum and upper abdomen/retroperitoneum, as above, very similar to prior PET-CT 11/17/2013. In addition, there is persistent splenomegaly. Clinical correlation to exclude underlying lymphoproliferative disorder is suggested. 2. Cholelithiasis without evidence of acute cholecystitis at this time. 3. Atherosclerosis, including left main and 3 vessel coronary artery disease. Assessment for potential risk factor modification, dietary therapy or pharmacologic therapy may be warranted, if clinically indicated. 4. 6 mm left upper lobe pulmonary nodule (image 29 of series 4), unchanged compared to prior chest CT 11/12/2013. This stability over the prior 7 month time interval is reassuring, but an additional followup noncontrast chest CT in 1 year is recommended at this time. This recommendation follows the consensus statement: Guidelines for Management of Small Pulmonary Nodules Detected on CT Scans: A Statement from the [place] as published in Radiology 2005;237:395-400. 5. Additional incidental findings, similar to  prior examinations, as above.   Electronically Signed   By: Vinnie Langton M.D.   On: 06/21/2014 14:47   US Thyroid Biopsy  07/11/2014   CLINICAL DATA:  74 year old female with history of a dominant right-sided thyroid nodule which was previously biopsied in September 2015. 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 She presents today for repeat biopsy. Afirma testing will also be performed.  EXAM: ULTRASOUND GUIDED NEEDLE ASPIRATE BIOPSY OF THE THYROID GLAND  COMPARISON:  Prior thyroid biopsy 12/01/2013  PROCEDURE: Thyroid biopsy was thoroughly discussed with the patient and questions were answered. The benefits, risks, alternatives, and complications were also discussed. The patient understands and wishes to proceed with the procedure. Written consent was obtained.  Ultrasound was performed to localize and mark an adequate site for the biopsy. The patient was then prepped and draped in a normal sterile fashion. Local anesthesia was provided with 1% lidocaine. Using direct ultrasound guidance, 2 passes were made using needles into the nodule within the right lobe of the thyroid. The next 2 passes were performed using 29 gauge needles and placed in the collection container for a firm at testing. A final fifth pass was made with a 25 gauge needle and added to the pathological slides. Ultrasound was used to confirm needle placements on all occasions. Specimens were sent to Pathology for analysis.  COMPLICATIONS:  None immediate  FINDINGS: Technically successful ultrasound-guided FNA biopsy.  IMPRESSION: Ultrasound guided needle aspirate biopsy performed of the dominant right thyroid nodule previously classified is a Bethesda class 3 indeterminate follicular lesion.  Two additional biopsy passes were obtained for reflex Afirma testing.  Signed,  Criselda Peaches, MD  [facility]  [facility]   Electronically  Signed   By: Jacqulynn Cadet M.D.   On: 07/11/2014 17:55    ASSESSMENT: This is a very pleasant 74 years old overweight white female who presented for evaluation of persistent lymphadenopathy in the mediastinum and retroperitoneum as well as splenomegaly suspicious for lymphoproliferative disorder but has been stable for the last 7 months. 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   PLAN: I had a lengthy discussion with the patient and her family today about her current condition. I explained to the patient that her lymphadenopathy and splenomegaly has been stable for the last 7 months which is an assuring sign of indolent condition. The biopsy of mediastinal lymph nodes or retroperitoneal lymph nodes will be risky at this point especially with the small size lymph nodes. I recommended for the patient to continue on observation for now. I will see her back for follow-up visit in one year with repeat CT scan of the chest, abdomen and pelvis for reevaluation of her disease but the patient was advised to call immediately if she has any concerning symptoms in the interval including unintentional weight loss, night sweats, palpable lymphadenopathy, anemia or thrombocytopenia. She will continue her routine follow-up visit by her primary care physician. The patient was advised to call immediately if she has any concerning symptoms in the interval. The patient and her family agreed to the current plan.  The patient voices understanding of current disease status and treatment options and is in agreement with the current care plan.  All questions were answered. The patient knows to call the clinic with any problems, questions or concerns. We can certainly see the patient much sooner if necessary.  Thank you so much for allowing me to participate in the care of TRENELL MOXEY. I will continue to follow up the  patient with you and assist in her care.  I spent 40 minutes counseling the patient face to face. The total time spent in the appointment was 60 minutes.  Disclaimer: This note was dictated with voice recognition software. Similar sounding words can inadvertently be transcribed and may not be corrected upon review.   Sasuke Yaffe K. July 17, 2014, 2:15 PM

## 2014-07-28 ENCOUNTER — Encounter (HOSPITAL_COMMUNITY): Payer: Self-pay

## 2014-07-31 ENCOUNTER — Encounter: Payer: Self-pay | Admitting: Cardiology

## 2014-07-31 ENCOUNTER — Ambulatory Visit (INDEPENDENT_AMBULATORY_CARE_PROVIDER_SITE_OTHER): Payer: Medicare Other | Admitting: Cardiology

## 2014-07-31 ENCOUNTER — Encounter: Payer: Self-pay | Admitting: *Deleted

## 2014-07-31 VITALS — BP 140/70 | HR 76 | Ht 66.0 in | Wt 266.0 lb

## 2014-07-31 DIAGNOSIS — I480 Paroxysmal atrial fibrillation: Secondary | ICD-10-CM

## 2014-07-31 DIAGNOSIS — Z01810 Encounter for preprocedural cardiovascular examination: Secondary | ICD-10-CM | POA: Diagnosis not present

## 2014-07-31 DIAGNOSIS — M25559 Pain in unspecified hip: Secondary | ICD-10-CM

## 2014-07-31 DIAGNOSIS — I481 Persistent atrial fibrillation: Secondary | ICD-10-CM

## 2014-07-31 DIAGNOSIS — I4819 Other persistent atrial fibrillation: Secondary | ICD-10-CM

## 2014-07-31 DIAGNOSIS — I5032 Chronic diastolic (congestive) heart failure: Secondary | ICD-10-CM

## 2014-07-31 MED ORDER — PROPRANOLOL HCL 60 MG PO TABS: ORAL_TABLET | ORAL | Status: DC

## 2014-07-31 NOTE — Patient Instructions (Signed)
Medication Instructions:  Your physician recommends that you continue on your current medications as directed. Please refer to the Current Medication list given to you today.  Labwork: None  Testing/Procedures: None  Follow-Up: Follow up in 6 months with [name].  You will receive a letter in the mail 2 months before you are due.  Please call us when you receive this letter to schedule your follow up appointment.  You may proceed with hip surgery.  Thank you for choosing [facility]!!

## 2014-07-31 NOTE — Progress Notes (Signed)
Patient ID: Kristin Hood, female   DOB: September 09, 1940, 74 y.o.   MRN: [id number]      [address] [place],   [address] Phone: [phone] Fax:  [phone]  Date:  07/31/2014   ID:  Kristin Hood, DOB Feb 19, 1941, MRN [id number]  PCP:  Gennette Pac, MD   History of Present Illness: Kristin Hood is a 74 y.o. female with atrial fibrillation discovered on 08/20/12, chronic anticoagulation, prior stroke, received TPA for cerebral artery occlusion (could not raise arm-8/13) here for followup.  Echocardiogram demonstrated mild to moderate left atrial enlargement, normal ejection fraction, mild aortic stenosis, MAC, mild mitral regurgitation.  Previously she did have some associated dyspnea on exertion with no chest pain, previously described as moderate in severity relieved with rest with no radiation of symptoms.  No associated angina. She had stress test which showed inferior ischemia, transient ischemic dilatation with normal ejection fraction. Does water exercises. Had 3 knee replacements. 25 min of stair steppers. She only went 2 minutes on the prior exercise treadmill test before significant shortness of breath ensued.   She underwent catheterization on 08/14/11 which showed no evidence of coronary artery disease. LVEDP was 14 mm mercury. This was after possible inferior wall ischemia as well as 3 times a day was seen on nuclear stress test.  Went to ER on 12/10/12 had a terrific pain in her chest. Lasted about hour at home BP 159/100, 3 times. Went on to ER. Was worried. Once she got to [facility] she quit hurting. Sent to ER. Wanted to keep her overnight. Did troponin did well. Did OK. Prob GERD.   Went again to [facility]. Afib increased rate. PET scan. 3 nodules one thyroid. Endocrine, no good sample.  Atrial fibrillation has been under good control.  Unfortunately, her son-in-law died 2 weeks after neck surgery from pulmonary embolism.  07/31/14 - Here for  preop hip surgery. [facility] - Propanolol OK. Had GERD in evening at times. After dinner. Aware of AFIB. She is eager to perform hip surgery. Dr. Wynelle Link. She has had extensive workup including oncology workup for nodules in her lungs, lymph nodes, thyroid nodule. She will be followed up on a yearly basis and oncology. Reassurance. Overall, no chest pain other than mild GERD at night after dinner. No syncope, no change in shortness of breath.  Wt Readings from Last 3 Encounters:  07/31/14 266 lb (120.657 kg)  07/17/14 262 lb 8 oz (119.069 kg)  06/16/14 255 lb 3.2 oz (115.758 kg)     Past Medical History  Diagnosis Date  . Hypertension     , With mild concentric left ventricular hypertrophy  . Stroke   . Shingles   . Atrial fibrillation 07/2012    Eliquis, rate controlled  . Hx of adenomatous colonic polyps 2006    , Benign  . Mixed dyslipidemia   . Coarse tremors     , Essential  . CVA (cerebral vascular accident) 2013  . Hypercholesteremia   . Breast cancer 2001    left mastectomy    Past Surgical History  Procedure Laterality Date  . Mastectomy      Radical, left   . Tee without cardioversion  11/19/2011    Procedure: TRANSESOPHAGEAL ECHOCARDIOGRAM (TEE);  Surgeon: [name], MD;  Location: [facility] ENDOSCOPY;  Service: Cardiovascular;  Laterality: N/A;  . Knee surgery  2009    ,Total knee replacement  . Bunionectomy    . Cardiac catheterization  07/2010    , Without significant CAD  . Left rotator cuff    . Appendectomy    . Cesarean section    . Cardiac catheterization  07/2011    [name]  . Left heart catheterization with coronary angiogram N/A 08/14/2011    Procedure: LEFT HEART CATHETERIZATION WITH CORONARY ANGIOGRAM;  Surgeon: [name], MD;  Location: [facility] CATH LAB;  Service: Cardiovascular;  Laterality: N/A;    Current Outpatient Prescriptions  Medication Sig Dispense Refill  . amLODipine (NORVASC) 5 MG tablet Take 5 mg by mouth daily.    Marland Kitchen amoxicillin (AMOXIL)  500 MG capsule Take 2,000 mg by mouth once. 1 hour prior to dental appointments    . atorvastatin (LIPITOR) 10 MG tablet Take 10 mg by mouth daily.     Marland Kitchen ELIQUIS 5 MG TABS tablet Take 1 tablet by mouth two  times daily 180 tablet 1  . fenofibrate 160 MG tablet Take 160 mg by mouth daily.     Marland Kitchen HYDROcodone-acetaminophen (NORCO/VICODIN) 5-325 MG per tablet Take 1 tablet by mouth as needed. Take 1 tablet by mouth every 6 to 8 hours as needed for pain  0  . ketoconazole (NIZORAL) 2 % cream Apply 1 application topically as needed.  0  . losartan (COZAAR) 100 MG tablet Take 100 mg by mouth daily.    . Multiple Vitamin (MULTIVITAMIN) tablet Take 1 tablet by mouth daily. Calcium 300 mg    . Omega-3 Fatty Acids (FISH OIL PO) Take 980 mg by mouth 2 (two) times daily.     . propranolol (INDERAL) 60 MG tablet Take 2 tablets in the morning, 1 in the afternoon, and 1 in the evening. 360 tablet 0   No current facility-administered medications for this visit.    Allergies:    Allergies  Allergen Reactions  . Floxin [Ofloxacin] Nausea Only and Other (See Comments)    Dizziness, Vertigo    Social History:  The patient  reports that she quit smoking about 22 years ago. Her smoking use included Cigarettes. She has a 32.5 pack-year smoking history. She has never used smokeless tobacco. She reports that she drinks about 0.5 oz of alcohol per week. She reports that she does not use illicit drugs.   ROS:  Please see the history of present illness.   No syncope, no bleeding, no orthopnea, no PND, no further strokelike symptoms   All other systems reviewed and negative.   PHYSICAL EXAM: VS:  Ht 5\' 6"  (1.676 m)  Wt 266 lb (120.657 kg)  BMI 42.95 kg/m2 Well nourished, well developed, in no acute distress HEENT: normal Neck: no JVD Cardiac:  irreg normal rate S1, S2; RRR; no murmurdistant heart sounds Lungs:  clear to auscultation bilaterally, no wheezing, rhonchi or rales Abd: soft, nontender, no  hepatomegalyobese Ext: no edema Skin: warm and dry Neuro: no focal abnormalities noted  tremor  EKG: 07/31/14-76, atrial fibrillation-no significant change from prior. AFIB 77 No ST changes.  ASSESSMENT AND PLAN:  74 year old with atrial fibrillation, prior stroke on chronic anticoagulation with Eliquis here for followup.  1. Preoperative risk stratification-hip surgery.Dr. Wynelle Link. She may proceed with mildly increased cardiovascular risk. She has had prior stroke in the past. Cardiac catheterization in 2013 was overall reassuring. 2. Atrial fibrillation, persistent- Currently on Inderal, rate control. If she has a rapid ventricular response episode, I've instructed her to take an extra Inderal tablet. We will send this prescription to [facility] at her request. Watch for signs of  rapid ventricular response during hip surgery. 3. Hyperlipidemia-currently on Lipitor 10 mg. Dr. Rex Kras has been monitoring lipids 4. Chronic anticoagulation-no bleeding. Continue to monitor CBC/creatinine every 6 months. Creatinine 0.69 and hemoglobin 13.4 on 11/12/13. 5. Chest pain - GERD. Doing well. Prior Cath reassuring.  6. Obesity-continue to encourage weight loss 7. Chronic diastolic heart failure-shortness of breath. May be playing a role. Also prior smoking history as well. proBNP was 1893 during last hospitalization. BNP could also be elevated in the setting of obesity. 8. HTN - excellent blood pressure today. Medications reviewed. Previously, she showed me a list of blood pressure recordings that were labile at one point. Overall, we will make no changes at this time. 9. Returning to clinic in 6 months.  Signed, [name], MD [facility]  07/31/2014 2:20 PM

## 2014-08-01 NOTE — Progress Notes (Signed)
Called into pharmacy since EPIC is not allowing e-scribing  #[phone]

## 2014-08-08 ENCOUNTER — Ambulatory Visit: Payer: Self-pay | Admitting: Orthopedic Surgery

## 2014-09-28 ENCOUNTER — Ambulatory Visit: Payer: Self-pay | Admitting: Orthopedic Surgery

## 2014-09-28 NOTE — Progress Notes (Signed)
Preoperative surgical orders have been place into the Epic hospital system for Kristin Hood on 09/28/2014, 5:17 PM  by Mickel Crow for surgery on 10-11-2014.  Preop Total Hip - Anterior Approach orders including IV Tylenol, and IV Decadron as long as there are no contraindications to the above medications. Arlee Muslim, PA-C

## 2014-10-03 ENCOUNTER — Ambulatory Visit: Payer: Self-pay | Admitting: Orthopedic Surgery

## 2014-10-03 NOTE — Progress Notes (Signed)
07/31/2014-Cardiac clearance from [name] on chart. 02/13/2014-Cardiac clearance from [place]. Skains on chart. 06/07/2014-Pre-operative clearance from Dr. [name] and surgical clearance office note on chart.

## 2014-10-03 NOTE — H&P (Signed)
Kristin Hood DOB: 18-Aug-1940 Married / Language: English / Race: White Female Date of Admission:  10/11/2014 CC: Left Hip Pain History of Present Illness  The patient is a 74 year old female who comes in for a preoperative History and Physical. The patient is scheduled for a left total hip arthroplasty (anterior) to be performed by Dr. Dione Hood. Aluisio, MD at [facility] on 10-11-2014. The patient is a 74 year old female who presented with a hip problem. The patient reports left hip problems including pain. The symptoms began without any known injury. Prior to being seen, the patient was previously evaluated by a colleague Dr. Noemi Hood. Previous treatment for this problem has included corticosteroid injection (dexamethasone, which she states only helped for about a week and a half). [name] has developed significant left hip pain the past several months. She does not recall an injury leading to this. Pain is in the left groin and lateral hip radiating down to her knee. She has had an x-ray and MRI. She states that it hurts at all times. It is definitely limiting what she can and cannot do. She has advanced end-stage arthritis, left hip, progressive in nature. It appears that she has a rapidly progressive osteoarthritis. At this point the most predictable means of getting her better would be total hip arthroplasty. She is ready to proceed with surgery. They have been treated conservatively in the past for the above stated problem and despite conservative measures, they continue to have progressive pain and severe functional limitations and dysfunction. They have failed non-operative management including home exercise, medications, and injections. It is felt that they would benefit from undergoing total joint replacement. Risks and benefits of the procedure have been discussed with the patient and they elect to proceed with surgery. There are no active contraindications to surgery such as  ongoing infection or rapidly progressive neurological disease.  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  Family History  Rheumatoid Arthritis grandfather fathers side Hypertension mother and father Congestive Heart Failure father  Social History  Tobacco / smoke exposure no Pain Contract no Illicit drug use no Tobacco use former smoker; smoke(d) 2 pack(s) per day Number of flights of stairs before winded less than 1 Marital status married Living situation live with spouse Current work status retired Engineer, agricultural (Currently) no Drug/Alcohol Rehab (Previously) no Children 2 Alcohol use current drinker; drinks wine; only occasionally per week Exercise Exercises weekly; does other and gym / weights Advance Directives Living Will, Healthcare POA  Medication History Hydrocodone-Acetaminophen (5-325MG  Tablet, Oral) Active. (alternates with Tylenol) Losartan Potassium (100MG  Tablet, 1 Oral daily) Active. AmLODIPine Besylate (5MG  Tablet, 1 Oral daily) Active. Amoxicillin (500MG  Capsule, Oral) Active. (for dental procedures) Eliquis (5MG  Tablet, 2 Oral daily) Active. Fenofibrate (160MG  Tablet, 1 Oral daily) Active. Atorvastatin Calcium (10MG  Tablet, 1 Oral daily) Active. Fish Oil Concentrate (435MG  Capsule, 980mg  Oral 4 daily) Active. iron (65 mgs per day) Active. Multiple Vitamin (1 (one) Oral) Active. Propranolol HCl (60MG  Tablet, 4 Oral  daily) Active. Ketoconazole (2% Cream, External) Active.  Past Surgical History Appendectomy Breast Biopsy bilateral Cesarean Delivery 2 times Mastectomy left  Tonsillectomy Total Knee Replacement bilateral Foot Surgery - Both bilateral bunionectomies with hammer toes Cataract Extraction-Bilateral   Review of Systems General Not Present- Chills, Fatigue, Fever, Memory Loss, Night Sweats, Weight Gain and Weight Loss. Skin Not Present- Eczema, Hives, Itching, Lesions and Rash. HEENT Not Present- Dentures, Double Vision, Headache, Hearing Loss, Tinnitus and Visual Loss. Respiratory Present- Shortness of breath with exertion. Not Present- Allergies, Chronic Cough, Coughing up blood and Shortness of breath at rest. Cardiovascular Not Present- Chest Pain, Difficulty Breathing Lying Down, Murmur, Palpitations, Racing/skipping heartbeats and Swelling. Gastrointestinal Not Present- Abdominal Pain, Bloody Stool, Constipation, Diarrhea, Difficulty Swallowing, Heartburn, Jaundice, Loss of appetitie, Nausea and Vomiting. Female Genitourinary Present- Urinating at Night. Not Present- Blood in Urine, Discharge, Flank Pain, Incontinence, Painful Urination, Urgency, Urinary frequency, Urinary Retention and Weak urinary stream. Musculoskeletal Present- Joint Pain and Muscle Weakness. Not Present- Back Pain, Joint Swelling, Morning Stiffness, Muscle Pain and Spasms. Neurological Not Present- Blackout spells, Difficulty with balance, Dizziness, Paralysis, Tremor and Weakness. Psychiatric Not Present- Insomnia.  Vitals Height: 65in Height was reported by patient. BP: 118/78 (Sitting, Right Arm, Standard)  Physical Exam General Mental Status -Alert, cooperative and good historian. General Appearance-pleasant, Not in acute distress. Orientation-Oriented X3. Build & Nutrition-Well nourished and Well developed.  Head and Neck Head-normocephalic, atraumatic . Neck Global Assessment  - supple, no bruit auscultated on the right, no bruit auscultated on the left.  Eye Pupil - Bilateral-Regular and Round. Motion - Bilateral-EOMI.  Chest and Lung Exam Auscultation Breath sounds - clear at anterior chest wall and clear at posterior chest wall. Adventitious sounds - No Adventitious sounds.  Cardiovascular Auscultation Rhythm - Regular rate and rhythm. Heart Sounds - S1 WNL and S2 WNL. Murmurs & Other Heart Sounds - Auscultation of the heart reveals - No Murmurs.  Abdomen Inspection Contour - Generalized moderate distention. Palpation/Percussion Tenderness - Abdomen is non-tender to palpation. Rigidity (guarding) - Abdomen is soft. Auscultation Auscultation of the abdomen reveals - Bowel sounds normal.  Female Genitourinary Note: Not done, not pertinent to present illness   Musculoskeletal Note: On exam she is alert and oriented, in no apparent distress. Her right hip has normal range of motion without discomfort. Left hip flexion to about 100, minimal internal rotation, about 20 external rotation, 20 abduction. Pulses, sensation, and motor are intact.  IMAGING She had an MRI in the [facility] system showing severe inflammation in the hip and degenerative change.  She did not have plain films. We took those today, AP pelvis and lateral of the hip, and she has significant bone-on-bone arthritis in that left hip. The right hip looks normal.   Assessment & Plan Primary osteoarthritis of left hip (M16.12)  Note:Surgical Plans: Left Total Hip Replacement - Anterior Approach  Disposition: Home  PCP: Dr. [name] - Patient has been seen preoperatively and felt to be stable for surgery. Cards: [name] - Patient has been seen preoperatively and felt to be stable for surgery. Hold Eliquis 2 days prior to surgery. Resume as soon as possible post op.  Topical TXA - History of Stroke  Anesthesia Issues: None  Signed electronically by Ok Edwards, III PA-C

## 2014-10-04 ENCOUNTER — Telehealth: Payer: Self-pay | Admitting: Neurology

## 2014-10-04 ENCOUNTER — Encounter (HOSPITAL_COMMUNITY)
Admission: RE | Admit: 2014-10-04 | Discharge: 2014-10-04 | Disposition: A | Discharge status: Home / Self Care | Payer: Medicare Other | Source: Ambulatory Visit | Attending: Orthopedic Surgery | Admitting: Orthopedic Surgery

## 2014-10-04 ENCOUNTER — Encounter (HOSPITAL_COMMUNITY): Payer: Self-pay

## 2014-10-04 DIAGNOSIS — Z01812 Encounter for preprocedural laboratory examination: Secondary | ICD-10-CM | POA: Insufficient documentation

## 2014-10-04 DIAGNOSIS — M1612 Unilateral primary osteoarthritis, left hip: Secondary | ICD-10-CM | POA: Insufficient documentation

## 2014-10-04 HISTORY — DX: Other complications of anesthesia, initial encounter: T88.59XA

## 2014-10-04 HISTORY — DX: Endocarditis, valve unspecified: I38

## 2014-10-04 HISTORY — DX: Adverse effect of unspecified anesthetic, initial encounter: T41.45XA

## 2014-10-04 HISTORY — DX: Personal history of antineoplastic chemotherapy: Z92.21

## 2014-10-04 HISTORY — DX: Cardiac arrhythmia, unspecified: I49.9

## 2014-10-04 LAB — CBC
HCT: 38 % (ref 36.0–46.0)
Hemoglobin: 12.1 g/dL (ref 12.0–15.0)
MCH: 28.8 pg (ref 26.0–34.0)
MCHC: 31.8 g/dL (ref 30.0–36.0)
MCV: 90.5 fL (ref 78.0–100.0)
PLATELETS: 241 10*3/uL (ref 150–400)
RBC: 4.2 MIL/uL (ref 3.87–5.11)
RDW: 15 % (ref 11.5–15.5)
WBC: 3.3 10*3/uL — ABNORMAL LOW (ref 4.0–10.5)

## 2014-10-04 LAB — URINALYSIS, ROUTINE W REFLEX MICROSCOPIC
Bilirubin Urine: NEGATIVE
GLUCOSE, UA: NEGATIVE mg/dL
HGB URINE DIPSTICK: NEGATIVE
Ketones, ur: NEGATIVE mg/dL
Nitrite: NEGATIVE
PH: 6.5 (ref 5.0–8.0)
Protein, ur: NEGATIVE mg/dL
SPECIFIC GRAVITY, URINE: 1.025 (ref 1.005–1.030)
Urobilinogen, UA: 1 mg/dL (ref 0.0–1.0)

## 2014-10-04 LAB — PROTIME-INR
INR: 1.26 (ref 0.00–1.49)
PROTHROMBIN TIME: 15.9 s — AB (ref 11.6–15.2)

## 2014-10-04 LAB — COMPREHENSIVE METABOLIC PANEL
ALT: 22 U/L (ref 14–54)
AST: 32 U/L (ref 15–41)
Albumin: 3.9 g/dL (ref 3.5–5.0)
Alkaline Phosphatase: 35 U/L — ABNORMAL LOW (ref 38–126)
Anion gap: 6 (ref 5–15)
BILIRUBIN TOTAL: 1 mg/dL (ref 0.3–1.2)
BUN: 19 mg/dL (ref 6–20)
CO2: 28 mmol/L (ref 22–32)
Calcium: 9.6 mg/dL (ref 8.9–10.3)
Chloride: 106 mmol/L (ref 101–111)
Creatinine, Ser: 0.67 mg/dL (ref 0.44–1.00)
GLUCOSE: 109 mg/dL — AB (ref 65–99)
POTASSIUM: 5.1 mmol/L (ref 3.5–5.1)
Sodium: 140 mmol/L (ref 135–145)
Total Protein: 7.8 g/dL (ref 6.5–8.1)

## 2014-10-04 LAB — URINE MICROSCOPIC-ADD ON

## 2014-10-04 LAB — SURGICAL PCR SCREEN
MRSA, PCR: NEGATIVE
Staphylococcus aureus: NEGATIVE

## 2014-10-04 LAB — APTT: aPTT: 40 seconds — ABNORMAL HIGH (ref 24–37)

## 2014-10-04 NOTE — Patient Instructions (Signed)
Kristin Hood  10/04/2014   Your procedure is scheduled on: Wednesday 10/11/2014  Report to [facility] Main  Entrance take [facility]  elevators to 3rd floor to  [place] at  150  PM.  Call this number if you have problems the morning of surgery [phone]   Remember: ONLY 1 PERSON MAY GO WITH YOU TO SHORT STAY TO GET  READY MORNING OF [place].   Do not eat food  :After Midnight.  MAY HAVE CLEAR LIQUIDS FROM MIDNIGHT UP UNTIL 0750 AM THEN NOTHING UNTIL AFTER SURGERY!     CLEAR LIQUID DIET   Foods Allowed                                                                     Foods Excluded  Coffee and tea, regular and decaf                             liquids that you cannot  Plain Jell-O in any flavor                                             see through such as: Fruit ices (not with fruit pulp)                                     milk, soups, orange juice  Iced Popsicles                                    All solid food Carbonated beverages, regular and diet                                    Cranberry, grape and apple juices Sports drinks like Gatorade Lightly seasoned clear broth or consume(fat free) Sugar, honey syrup  Sample Menu Breakfast                                Lunch                                     Supper Cranberry juice                    Beef broth                            Chicken broth Jell-O                                     Grape juice  Apple juice Coffee or tea                        Jell-O                                      Popsicle                                                Coffee or tea                        Coffee or tea  _____________________________________________________________________     Take these medicines the morning of surgery with A SIP OF WATER: PROPANOLOL (INDERAL), AMLODIPINE (NORVASC)                               You may not have any metal on your body including  hair pins and              piercings  Do not wear jewelry, make-up, lotions, powders or perfumes, deodorant             Do not wear nail polish.  Do not shave  48 hours prior to surgery.              Men may shave face and neck.   Do not bring valuables to the hospital. [place].  Contacts, dentures or bridgework may not be worn into surgery.  Leave suitcase in the car. After surgery it may be brought to your room.     Patients discharged the day of surgery will not be allowed to drive home.  Name and phone number of your driver:  Special Instructions: N/A              Please read over the following fact sheets you were given: _____________________________________________________________________             [facility] - Preparing for Surgery Before surgery, you can play an important role.  Because skin is not sterile, your skin needs to be as free of germs as possible.  You can reduce the number of germs on your skin by washing with CHG (chlorahexidine gluconate) soap before surgery.  CHG is an antiseptic cleaner which kills germs and bonds with the skin to continue killing germs even after washing. Please DO NOT use if you have an allergy to CHG or antibacterial soaps.  If your skin becomes reddened/irritated stop using the CHG and inform your nurse when you arrive at Short Stay. Do not shave (including legs and underarms) for at least 48 hours prior to the first CHG shower.  You may shave your face/neck. Please follow these instructions carefully:  1.  Shower with CHG Soap the night before surgery and the  morning of Surgery.  2.  If you choose to wash your hair, wash your hair first as usual with your  normal  shampoo.  3.  After you shampoo, rinse your hair and body thoroughly to remove the  shampoo.  4.  Use CHG as you would any other liquid soap.  You can apply chg directly  to the skin and wash                        Gently with a scrungie or clean washcloth.  5.  Apply the CHG Soap to your body ONLY FROM THE NECK DOWN.   Do not use on face/ open                           Wound or open sores. Avoid contact with eyes, ears mouth and genitals (private parts).                       Wash face,  Genitals (private parts) with your normal soap.             6.  Wash thoroughly, paying special attention to the area where your surgery  will be performed.  7.  Thoroughly rinse your body with warm water from the neck down.  8.  DO NOT shower/wash with your normal soap after using and rinsing off  the CHG Soap.                9.  Pat yourself dry with a clean towel.            10.  Wear clean pajamas.            11.  Place clean sheets on your bed the night of your first shower and do not  sleep with pets. Day of Surgery : Do not apply any lotions/deodorants the morning of surgery.  Please wear clean clothes to the hospital/surgery center.  FAILURE TO FOLLOW THESE INSTRUCTIONS MAY RESULT IN THE CANCELLATION OF YOUR SURGERY PATIENT SIGNATURE_________________________________  NURSE SIGNATURE__________________________________  ________________________________________________________________________   Kristin Hood  An incentive spirometer is a tool that can help keep your lungs clear and active. This tool measures how well you are filling your lungs with each breath. Taking long deep breaths may help reverse or decrease the chance of developing breathing (pulmonary) problems (especially infection) following:  A long period of time when you are unable to move or be active. BEFORE THE PROCEDURE   If the spirometer includes an indicator to show your best effort, your nurse or respiratory therapist will set it to a desired goal.  If possible, sit up straight or lean slightly forward. Try not to slouch.  Hold the incentive spirometer in an upright position. INSTRUCTIONS FOR USE   Sit on the edge of  your bed if possible, or sit up as far as you can in bed or on a chair.  Hold the incentive spirometer in an upright position.  Breathe out normally.  Place the mouthpiece in your mouth and seal your lips tightly around it.  Breathe in slowly and as deeply as possible, raising the piston or the ball toward the top of the column.  Hold your breath for 3-5 seconds or for as long as possible. Allow the piston or ball to fall to the bottom of the column.  Remove the mouthpiece from your mouth and breathe out normally.  Rest for a few seconds and repeat Steps 1 through 7 at least 10 times every 1-2 hours when you are awake. Take your time and take a few normal breaths between deep breaths.  The spirometer may include an indicator to  show your best effort. Use the indicator as a goal to work toward during each repetition.  After each set of 10 deep breaths, practice coughing to be sure your lungs are clear. If you have an incision (the cut made at the time of surgery), support your incision when coughing by placing a pillow or rolled up towels firmly against it. Once you are able to get out of bed, walk around indoors and cough well. You may stop using the incentive spirometer when instructed by your caregiver.  RISKS AND COMPLICATIONS  Take your time so you do not get dizzy or light-headed.  If you are in pain, you may need to take or ask for pain medication before doing incentive spirometry. It is harder to take a deep breath if you are having pain. AFTER USE  Rest and breathe slowly and easily.  It can be helpful to keep track of a log of your progress. Your caregiver can provide you with a simple table to help with this. If you are using the spirometer at home, follow these instructions: [place] IF:   You are having difficultly using the spirometer.  You have trouble using the spirometer as often as instructed.  Your pain medication is not giving enough relief while using  the spirometer.  You develop fever of 100.5 F (38.1 C) or higher. SEEK IMMEDIATE MEDICAL CARE IF:   You cough up bloody sputum that had not been present before.  You develop fever of 102 F (38.9 C) or greater.  You develop worsening pain at or near the incision site. MAKE SURE YOU:   Understand these instructions.  Will watch your condition.  Will get help right away if you are not doing well or get worse. Document Released: 07/28/2006 Document Revised: 06/09/2011 Document Reviewed: 09/28/2006 Exit[employer] Patient Information 2014 [employer], [place].   ________________________________________________________________________  WHAT IS A BLOOD TRANSFUSION? Blood Transfusion Information  A transfusion is the replacement of blood or some of its parts. Blood is made up of multiple cells which provide different functions.  Red blood cells carry oxygen and are used for blood loss replacement.  White blood cells fight against infection.  Platelets control bleeding.  Plasma helps clot blood.  Other blood products are available for specialized needs, such as hemophilia or other clotting disorders. BEFORE THE TRANSFUSION  Who gives blood for transfusions?   Healthy volunteers who are fully evaluated to make sure their blood is safe. This is blood bank blood. Transfusion therapy is the safest it has ever been in the practice of medicine. Before blood is taken from a donor, a complete history is taken to make sure that person has no history of diseases nor engages in risky social behavior (examples are intravenous drug use or sexual activity with multiple partners). The donor's travel history is screened to minimize risk of transmitting infections, such as malaria. The donated blood is tested for signs of infectious diseases, such as HIV and hepatitis. The blood is then tested to be sure it is compatible with you in order to minimize the chance of a transfusion reaction. If you or a relative  donates blood, this is often done in anticipation of surgery and is not appropriate for emergency situations. It takes many days to process the donated blood. RISKS AND COMPLICATIONS Although transfusion therapy is very safe and saves many lives, the main dangers of transfusion include:   Getting an infectious disease.  Developing a transfusion reaction. This is an allergic reaction  to something in the blood you were given. Every precaution is taken to prevent this. The decision to have a blood transfusion has been considered carefully by your caregiver before blood is given. Blood is not given unless the benefits outweigh the risks. AFTER THE TRANSFUSION  Right after receiving a blood transfusion, you will usually feel much better and more energetic. This is especially true if your red blood cells have gotten low (anemic). The transfusion raises the level of the red blood cells which carry oxygen, and this usually causes an energy increase.  The nurse administering the transfusion will monitor you carefully for complications. HOME CARE INSTRUCTIONS  No special instructions are needed after a transfusion. You may find your energy is better. Speak with your caregiver about any limitations on activity for underlying diseases you may have. SEEK MEDICAL CARE IF:   Your condition is not improving after your transfusion.  You develop redness or irritation at the intravenous (IV) site. SEEK IMMEDIATE MEDICAL CARE IF:  Any of the following symptoms occur over the next 12 hours:  Shaking chills.  You have a temperature by mouth above 102 F (38.9 C), not controlled by medicine.  Chest, back, or muscle pain.  People around you feel you are not acting correctly or are confused.  Shortness of breath or difficulty breathing.  Dizziness and fainting.  You get a rash or develop hives.  You have a decrease in urine output.  Your urine turns a dark color or changes to pink, red, or brown. Any  of the following symptoms occur over the next 10 days:  You have a temperature by mouth above 102 F (38.9 C), not controlled by medicine.  Shortness of breath.  Weakness after normal activity.  The white part of the eye turns yellow (jaundice).  You have a decrease in the amount of urine or are urinating less often.  Your urine turns a dark color or changes to pink, red, or brown. Document Released: 03/14/2000 Document Revised: 06/09/2011 Document Reviewed: 11/01/2007 [facility] Patient Information 2014 [place], [place].  _______________________________________________________________________

## 2014-10-04 NOTE — Telephone Encounter (Signed)
Patient called to give Dr Leonie Man a courtesy call to let him know she is going to have Lt hip replacement 10/11/14. She states she has approval from Dr Skains(Cardiologist) and Dr Lennette Bihari Little(PCP). She can be reached at [phone] if need to call her.

## 2014-10-04 NOTE — Telephone Encounter (Signed)
Spoke to the patient and stated that she had a very small but acceptable peri-procedure risk of stroke when she was off her anticoagulation needs but this risk would not diminish with time and hence the risk-benefit ratio was in favor of doing the surgery

## 2014-10-04 NOTE — Progress Notes (Signed)
   10/04/14 1504  OBSTRUCTIVE SLEEP APNEA  Have you ever been diagnosed with sleep apnea through a sleep study? No  Do you snore loudly (loud enough to be heard through closed doors)?  0  Do you often feel tired, fatigued, or sleepy during the daytime? 0  Has anyone observed you stop breathing during your sleep? 0  Do you have, or are you being treated for high blood pressure? 1  BMI more than 35 kg/m2? 1  Age over 75 years old? 1  Neck circumference greater than 40 cm/16 inches? 1  Gender: 0

## 2014-10-04 NOTE — Progress Notes (Signed)
06/21/2014-CT abdomen with IV contrast from Dr. Lennette Bihari Little's office on chart.

## 2014-10-11 ENCOUNTER — Inpatient Hospital Stay (HOSPITAL_COMMUNITY): Payer: Medicare Other | Admitting: Anesthesiology

## 2014-10-11 ENCOUNTER — Inpatient Hospital Stay (HOSPITAL_COMMUNITY): Payer: Medicare Other

## 2014-10-11 ENCOUNTER — Encounter (HOSPITAL_COMMUNITY)
Admission: RE | Disposition: A | Discharge status: Home Health | Payer: Self-pay | Source: Ambulatory Visit | Attending: Orthopedic Surgery

## 2014-10-11 ENCOUNTER — Inpatient Hospital Stay (HOSPITAL_COMMUNITY)
Admission: RE | Admit: 2014-10-11 | Discharge: 2014-10-13 | DRG: 470 | Disposition: A | Discharge status: Home Health | Payer: Medicare Other | Source: Ambulatory Visit | Attending: Orthopedic Surgery | Admitting: Orthopedic Surgery

## 2014-10-11 ENCOUNTER — Encounter (HOSPITAL_COMMUNITY): Payer: Self-pay | Admitting: *Deleted

## 2014-10-11 DIAGNOSIS — Z8249 Family history of ischemic heart disease and other diseases of the circulatory system: Secondary | ICD-10-CM

## 2014-10-11 DIAGNOSIS — I4891 Unspecified atrial fibrillation: Secondary | ICD-10-CM | POA: Diagnosis present

## 2014-10-11 DIAGNOSIS — Z7901 Long term (current) use of anticoagulants: Secondary | ICD-10-CM | POA: Diagnosis not present

## 2014-10-11 DIAGNOSIS — R011 Cardiac murmur, unspecified: Secondary | ICD-10-CM | POA: Diagnosis present

## 2014-10-11 DIAGNOSIS — Z86010 Personal history of colonic polyps: Secondary | ICD-10-CM | POA: Diagnosis not present

## 2014-10-11 DIAGNOSIS — G250 Essential tremor: Secondary | ICD-10-CM | POA: Diagnosis present

## 2014-10-11 DIAGNOSIS — E780 Pure hypercholesterolemia: Secondary | ICD-10-CM | POA: Diagnosis present

## 2014-10-11 DIAGNOSIS — H9193 Unspecified hearing loss, bilateral: Secondary | ICD-10-CM | POA: Diagnosis present

## 2014-10-11 DIAGNOSIS — E782 Mixed hyperlipidemia: Secondary | ICD-10-CM | POA: Diagnosis present

## 2014-10-11 DIAGNOSIS — Z9221 Personal history of antineoplastic chemotherapy: Secondary | ICD-10-CM

## 2014-10-11 DIAGNOSIS — Z79899 Other long term (current) drug therapy: Secondary | ICD-10-CM

## 2014-10-11 DIAGNOSIS — Z8673 Personal history of transient ischemic attack (TIA), and cerebral infarction without residual deficits: Secondary | ICD-10-CM

## 2014-10-11 DIAGNOSIS — I8390 Asymptomatic varicose veins of unspecified lower extremity: Secondary | ICD-10-CM | POA: Diagnosis present

## 2014-10-11 DIAGNOSIS — Z9012 Acquired absence of left breast and nipple: Secondary | ICD-10-CM | POA: Diagnosis present

## 2014-10-11 DIAGNOSIS — M1612 Unilateral primary osteoarthritis, left hip: Secondary | ICD-10-CM | POA: Diagnosis not present

## 2014-10-11 DIAGNOSIS — I10 Essential (primary) hypertension: Secondary | ICD-10-CM | POA: Diagnosis present

## 2014-10-11 DIAGNOSIS — Z85850 Personal history of malignant neoplasm of thyroid: Secondary | ICD-10-CM

## 2014-10-11 DIAGNOSIS — Z96653 Presence of artificial knee joint, bilateral: Secondary | ICD-10-CM | POA: Diagnosis present

## 2014-10-11 DIAGNOSIS — Z96649 Presence of unspecified artificial hip joint: Secondary | ICD-10-CM

## 2014-10-11 DIAGNOSIS — Z87891 Personal history of nicotine dependence: Secondary | ICD-10-CM | POA: Diagnosis not present

## 2014-10-11 DIAGNOSIS — Z79891 Long term (current) use of opiate analgesic: Secondary | ICD-10-CM

## 2014-10-11 DIAGNOSIS — M169 Osteoarthritis of hip, unspecified: Secondary | ICD-10-CM | POA: Diagnosis present

## 2014-10-11 DIAGNOSIS — Z853 Personal history of malignant neoplasm of breast: Secondary | ICD-10-CM | POA: Diagnosis not present

## 2014-10-11 DIAGNOSIS — I6523 Occlusion and stenosis of bilateral carotid arteries: Secondary | ICD-10-CM | POA: Diagnosis not present

## 2014-10-11 HISTORY — PX: TOTAL HIP ARTHROPLASTY: SHX124

## 2014-10-11 LAB — TYPE AND SCREEN
ABO/RH(D): A POS
ANTIBODY SCREEN: NEGATIVE

## 2014-10-11 LAB — PROTIME-INR
INR: 1.09 (ref 0.00–1.49)
PROTHROMBIN TIME: 14.3 s (ref 11.6–15.2)

## 2014-10-11 SURGERY — ARTHROPLASTY, HIP, TOTAL, ANTERIOR APPROACH
Anesthesia: General | Site: Hip | Laterality: Left

## 2014-10-11 MED ORDER — METHOCARBAMOL 500 MG PO TABS
500.0000 mg | ORAL_TABLET | Freq: Four times a day (QID) | ORAL | Status: DC | PRN
  Administered 2014-10-12: 500 mg via ORAL
  Filled 2014-10-11: qty 1

## 2014-10-11 MED ORDER — PHENYLEPHRINE HCL 10 MG/ML IJ SOLN
INTRAMUSCULAR | Status: DC | PRN
  Administered 2014-10-11 (×2): 80 ug via INTRAVENOUS

## 2014-10-11 MED ORDER — AMLODIPINE BESYLATE 5 MG PO TABS
5.0000 mg | ORAL_TABLET | Freq: Every day | ORAL | Status: DC
  Filled 2014-10-11 (×2): qty 1

## 2014-10-11 MED ORDER — DEXAMETHASONE SODIUM PHOSPHATE 10 MG/ML IJ SOLN
INTRAMUSCULAR | Status: AC
  Filled 2014-10-11: qty 1

## 2014-10-11 MED ORDER — METOCLOPRAMIDE HCL 5 MG/ML IJ SOLN: 5.0000 mg | Freq: Three times a day (TID) | INTRAMUSCULAR | Status: DC | PRN

## 2014-10-11 MED ORDER — PROPOFOL 10 MG/ML IV BOLUS
INTRAVENOUS | Status: AC
  Filled 2014-10-11: qty 20

## 2014-10-11 MED ORDER — ONDANSETRON HCL 4 MG/2ML IJ SOLN
INTRAMUSCULAR | Status: DC | PRN
  Administered 2014-10-11: 4 mg via INTRAVENOUS

## 2014-10-11 MED ORDER — BISACODYL 10 MG RE SUPP
10.0000 mg | Freq: Every day | RECTAL | Status: DC | PRN
  Administered 2014-10-12: 10 mg via RECTAL
  Filled 2014-10-11: qty 1

## 2014-10-11 MED ORDER — ONDANSETRON HCL 4 MG PO TABS: 4.0000 mg | ORAL_TABLET | Freq: Four times a day (QID) | ORAL | Status: DC | PRN

## 2014-10-11 MED ORDER — DEXAMETHASONE SODIUM PHOSPHATE 10 MG/ML IJ SOLN
10.0000 mg | Freq: Once | INTRAMUSCULAR | Status: AC
  Administered 2014-10-11: 10 mg via INTRAVENOUS

## 2014-10-11 MED ORDER — APIXABAN 5 MG PO TABS
5.0000 mg | ORAL_TABLET | Freq: Two times a day (BID) | ORAL | Status: DC
  Administered 2014-10-12 – 2014-10-13 (×3): 5 mg via ORAL
  Filled 2014-10-11 (×4): qty 1

## 2014-10-11 MED ORDER — ONDANSETRON HCL 4 MG/2ML IJ SOLN: 4.0000 mg | Freq: Four times a day (QID) | INTRAMUSCULAR | Status: DC | PRN

## 2014-10-11 MED ORDER — PROMETHAZINE HCL 25 MG/ML IJ SOLN
INTRAMUSCULAR | Status: AC
  Filled 2014-10-11: qty 1

## 2014-10-11 MED ORDER — DIPHENHYDRAMINE HCL 12.5 MG/5ML PO ELIX
12.5000 mg | ORAL_SOLUTION | ORAL | Status: DC | PRN
Start: 2014-10-11 — End: 2014-10-13

## 2014-10-11 MED ORDER — FENTANYL CITRATE (PF) 100 MCG/2ML IJ SOLN
INTRAMUSCULAR | Status: AC
  Filled 2014-10-11: qty 2

## 2014-10-11 MED ORDER — METOCLOPRAMIDE HCL 10 MG PO TABS: 5.0000 mg | ORAL_TABLET | Freq: Three times a day (TID) | ORAL | Status: DC | PRN

## 2014-10-11 MED ORDER — OXYCODONE HCL 5 MG PO TABS
5.0000 mg | ORAL_TABLET | ORAL | Status: DC | PRN
  Administered 2014-10-11 (×2): 5 mg via ORAL
  Administered 2014-10-12 – 2014-10-13 (×7): 10 mg via ORAL
  Filled 2014-10-11 (×7): qty 2
  Filled 2014-10-11 (×2): qty 1

## 2014-10-11 MED ORDER — MORPHINE SULFATE 2 MG/ML IJ SOLN: 1.0000 mg | INTRAMUSCULAR | Status: DC | PRN

## 2014-10-11 MED ORDER — LACTATED RINGERS IV SOLN
INTRAVENOUS | Status: DC
  Administered 2014-10-11: 17:00:00 via INTRAVENOUS

## 2014-10-11 MED ORDER — FLEET ENEMA 7-19 GM/118ML RE ENEM: 1.0000 | ENEMA | Freq: Once | RECTAL | Status: AC | PRN

## 2014-10-11 MED ORDER — LIDOCAINE HCL (CARDIAC) 20 MG/ML IV SOLN
INTRAVENOUS | Status: AC
  Filled 2014-10-11: qty 5

## 2014-10-11 MED ORDER — CEFAZOLIN SODIUM-DEXTROSE 2-3 GM-% IV SOLR
2.0000 g | Freq: Four times a day (QID) | INTRAVENOUS | Status: AC
  Administered 2014-10-11 – 2014-10-12 (×2): 2 g via INTRAVENOUS
  Filled 2014-10-11 (×2): qty 50

## 2014-10-11 MED ORDER — MENTHOL 3 MG MT LOZG: 1.0000 | LOZENGE | OROMUCOSAL | Status: DC | PRN

## 2014-10-11 MED ORDER — ACETAMINOPHEN 325 MG PO TABS: 650.0000 mg | ORAL_TABLET | Freq: Four times a day (QID) | ORAL | Status: DC | PRN

## 2014-10-11 MED ORDER — HYDROMORPHONE HCL 1 MG/ML IJ SOLN
0.2500 mg | INTRAMUSCULAR | Status: DC | PRN
  Administered 2014-10-11: 0.25 mg via INTRAVENOUS
  Administered 2014-10-11: 0.5 mg via INTRAVENOUS
  Administered 2014-10-11: 0.25 mg via INTRAVENOUS

## 2014-10-11 MED ORDER — 0.9 % SODIUM CHLORIDE (POUR BTL) OPTIME
TOPICAL | Status: DC | PRN
  Administered 2014-10-11: 1000 mL

## 2014-10-11 MED ORDER — HYDROMORPHONE HCL 1 MG/ML IJ SOLN
INTRAMUSCULAR | Status: AC
  Filled 2014-10-11: qty 1

## 2014-10-11 MED ORDER — ACETAMINOPHEN 500 MG PO TABS
1000.0000 mg | ORAL_TABLET | Freq: Four times a day (QID) | ORAL | Status: AC
  Administered 2014-10-11 – 2014-10-12 (×4): 1000 mg via ORAL
  Filled 2014-10-11 (×4): qty 2

## 2014-10-11 MED ORDER — SODIUM CHLORIDE 0.9 % IJ SOLN
INTRAMUSCULAR | Status: AC
  Filled 2014-10-11: qty 50

## 2014-10-11 MED ORDER — LACTATED RINGERS IV SOLN
INTRAVENOUS | Status: DC | PRN
  Administered 2014-10-11: 14:00:00 via INTRAVENOUS

## 2014-10-11 MED ORDER — TRAMADOL HCL 50 MG PO TABS: 50.0000 mg | ORAL_TABLET | Freq: Four times a day (QID) | ORAL | Status: DC | PRN

## 2014-10-11 MED ORDER — BUPIVACAINE HCL (PF) 0.25 % IJ SOLN
INTRAMUSCULAR | Status: DC | PRN
  Administered 2014-10-11: 30 mL

## 2014-10-11 MED ORDER — ONDANSETRON HCL 4 MG/2ML IJ SOLN
INTRAMUSCULAR | Status: AC
  Filled 2014-10-11: qty 2

## 2014-10-11 MED ORDER — SUCCINYLCHOLINE CHLORIDE 20 MG/ML IJ SOLN
INTRAMUSCULAR | Status: DC | PRN
  Administered 2014-10-11: 100 mg via INTRAVENOUS

## 2014-10-11 MED ORDER — ATORVASTATIN CALCIUM 10 MG PO TABS
10.0000 mg | ORAL_TABLET | Freq: Every evening | ORAL | Status: DC
  Administered 2014-10-11 – 2014-10-12 (×2): 10 mg via ORAL
  Filled 2014-10-11 (×3): qty 1

## 2014-10-11 MED ORDER — DEXTROSE 5 % IV SOLN
500.0000 mg | Freq: Four times a day (QID) | INTRAVENOUS | Status: DC | PRN
  Administered 2014-10-11: 500 mg via INTRAVENOUS
  Filled 2014-10-11 (×2): qty 5

## 2014-10-11 MED ORDER — FENTANYL CITRATE (PF) 100 MCG/2ML IJ SOLN
INTRAMUSCULAR | Status: DC | PRN
  Administered 2014-10-11: 100 ug via INTRAVENOUS

## 2014-10-11 MED ORDER — DOCUSATE SODIUM 100 MG PO CAPS
100.0000 mg | ORAL_CAPSULE | Freq: Two times a day (BID) | ORAL | Status: DC
  Administered 2014-10-11 – 2014-10-13 (×3): 100 mg via ORAL

## 2014-10-11 MED ORDER — PHENOL 1.4 % MT LIQD
1.0000 | OROMUCOSAL | Status: DC | PRN
  Filled 2014-10-11: qty 177

## 2014-10-11 MED ORDER — PROMETHAZINE HCL 25 MG/ML IJ SOLN
6.2500 mg | INTRAMUSCULAR | Status: DC | PRN
  Administered 2014-10-11: 6.25 mg via INTRAVENOUS

## 2014-10-11 MED ORDER — LOSARTAN POTASSIUM 50 MG PO TABS
100.0000 mg | ORAL_TABLET | Freq: Every day | ORAL | Status: DC
  Filled 2014-10-11 (×2): qty 2

## 2014-10-11 MED ORDER — DEXTROSE 5 % IV SOLN
3.0000 g | INTRAVENOUS | Status: DC
  Filled 2014-10-11: qty 3000

## 2014-10-11 MED ORDER — PROPRANOLOL HCL 60 MG PO TABS
60.0000 mg | ORAL_TABLET | Freq: Every day | ORAL | Status: DC
  Administered 2014-10-12: 60 mg via ORAL
  Filled 2014-10-11 (×2): qty 1

## 2014-10-11 MED ORDER — BUPIVACAINE HCL (PF) 0.25 % IJ SOLN
INTRAMUSCULAR | Status: AC
  Filled 2014-10-11: qty 30

## 2014-10-11 MED ORDER — ACETAMINOPHEN 10 MG/ML IV SOLN
INTRAVENOUS | Status: AC
  Filled 2014-10-11: qty 100

## 2014-10-11 MED ORDER — PROPRANOLOL HCL 60 MG PO TABS
60.0000 mg | ORAL_TABLET | Freq: Every day | ORAL | Status: DC
  Administered 2014-10-11 – 2014-10-12 (×2): 60 mg via ORAL
  Filled 2014-10-11 (×3): qty 1

## 2014-10-11 MED ORDER — SODIUM CHLORIDE 0.9 % IV SOLN: INTRAVENOUS | Status: DC

## 2014-10-11 MED ORDER — ACETAMINOPHEN 10 MG/ML IV SOLN
1000.0000 mg | Freq: Once | INTRAVENOUS | Status: AC
  Administered 2014-10-11: 1000 mg via INTRAVENOUS

## 2014-10-11 MED ORDER — TRANEXAMIC ACID 1000 MG/10ML IV SOLN
2000.0000 mg | Freq: Once | INTRAVENOUS | Status: DC
  Filled 2014-10-11: qty 20

## 2014-10-11 MED ORDER — STERILE WATER FOR IRRIGATION IR SOLN
Status: DC | PRN
  Administered 2014-10-11: 1500 mL

## 2014-10-11 MED ORDER — ACETAMINOPHEN 650 MG RE SUPP: 650.0000 mg | Freq: Four times a day (QID) | RECTAL | Status: DC | PRN

## 2014-10-11 MED ORDER — MIDAZOLAM HCL 2 MG/2ML IJ SOLN
INTRAMUSCULAR | Status: AC
  Filled 2014-10-11: qty 2

## 2014-10-11 MED ORDER — PROPOFOL 10 MG/ML IV BOLUS
INTRAVENOUS | Status: DC | PRN
  Administered 2014-10-11: 140 mg via INTRAVENOUS

## 2014-10-11 MED ORDER — HYDROMORPHONE HCL 2 MG/ML IJ SOLN
INTRAMUSCULAR | Status: AC
  Filled 2014-10-11: qty 1

## 2014-10-11 MED ORDER — FERROUS SULFATE 325 (65 FE) MG PO TABS
325.0000 mg | ORAL_TABLET | Freq: Every evening | ORAL | Status: DC
  Administered 2014-10-11 – 2014-10-12 (×2): 325 mg via ORAL
  Filled 2014-10-11 (×3): qty 1

## 2014-10-11 MED ORDER — MIDAZOLAM HCL 5 MG/5ML IJ SOLN
INTRAMUSCULAR | Status: DC | PRN
  Administered 2014-10-11: 2 mg via INTRAVENOUS

## 2014-10-11 MED ORDER — DEXAMETHASONE SODIUM PHOSPHATE 10 MG/ML IJ SOLN
10.0000 mg | Freq: Once | INTRAMUSCULAR | Status: AC
  Administered 2014-10-12: 10 mg via INTRAVENOUS
  Filled 2014-10-11: qty 1

## 2014-10-11 MED ORDER — SODIUM CHLORIDE 0.9 % IV SOLN
INTRAVENOUS | Status: DC
  Administered 2014-10-11: 19:00:00 via INTRAVENOUS

## 2014-10-11 MED ORDER — PROPRANOLOL HCL 60 MG PO TABS
120.0000 mg | ORAL_TABLET | Freq: Every day | ORAL | Status: DC
  Administered 2014-10-12 – 2014-10-13 (×2): 120 mg via ORAL
  Filled 2014-10-11 (×3): qty 2

## 2014-10-11 MED ORDER — CHLORHEXIDINE GLUCONATE 4 % EX LIQD: 60.0000 mL | Freq: Once | CUTANEOUS | Status: DC

## 2014-10-11 MED ORDER — TRANEXAMIC ACID 1000 MG/10ML IV SOLN
2000.0000 mg | INTRAVENOUS | Status: DC | PRN
  Administered 2014-10-11: 2000 mg via TOPICAL

## 2014-10-11 MED ORDER — CEFAZOLIN SODIUM 10 G IJ SOLR
3.0000 g | INTRAMUSCULAR | Status: AC
  Administered 2014-10-11: 3 g via INTRAVENOUS
  Filled 2014-10-11: qty 3000

## 2014-10-11 MED ORDER — HYDROMORPHONE HCL 1 MG/ML IJ SOLN
INTRAMUSCULAR | Status: DC | PRN
Start: 2014-10-11 — End: 2014-10-11
  Administered 2014-10-11 (×2): 1 mg via INTRAVENOUS

## 2014-10-11 MED ORDER — POLYETHYLENE GLYCOL 3350 17 G PO PACK
17.0000 g | PACK | Freq: Every day | ORAL | Status: DC | PRN
  Administered 2014-10-13: 17 g via ORAL
  Filled 2014-10-11: qty 1

## 2014-10-11 SURGICAL SUPPLY — 46 items
BAG DECANTER FOR FLEXI CONT (MISCELLANEOUS) ×3 IMPLANT
BAG SPEC THK2 15X12 ZIP CLS (MISCELLANEOUS)
BAG ZIPLOCK 12X15 (MISCELLANEOUS) IMPLANT
BLADE EXTENDED COATED 6.5IN (ELECTRODE) ×3 IMPLANT
BLADE SAG 18X100X1.27 (BLADE) ×3 IMPLANT
CAPT HIP TOTAL 2 ×2 IMPLANT
CLOSURE WOUND 1/2 X4 (GAUZE/BANDAGES/DRESSINGS) ×1
COVER PERINEAL POST (MISCELLANEOUS) ×3 IMPLANT
DECANTER SPIKE VIAL GLASS SM (MISCELLANEOUS) ×3 IMPLANT
DRAPE C-ARM 42X120 X-RAY (DRAPES) ×3 IMPLANT
DRAPE STERI IOBAN 125X83 (DRAPES) ×3 IMPLANT
DRAPE U-SHAPE 47X51 STRL (DRAPES) ×9 IMPLANT
DRSG ADAPTIC 3X8 NADH LF (GAUZE/BANDAGES/DRESSINGS) ×3 IMPLANT
DRSG MEPILEX BORDER 4X4 (GAUZE/BANDAGES/DRESSINGS) ×3 IMPLANT
DRSG MEPILEX BORDER 4X8 (GAUZE/BANDAGES/DRESSINGS) ×3 IMPLANT
DURAPREP 26ML APPLICATOR (WOUND CARE) ×3 IMPLANT
ELECT REM PT RETURN 9FT ADLT (ELECTROSURGICAL) ×3
ELECTRODE REM PT RTRN 9FT ADLT (ELECTROSURGICAL) ×1 IMPLANT
EVACUATOR 1/8 PVC DRAIN (DRAIN) ×3 IMPLANT
FACESHIELD WRAPAROUND (MASK) ×12 IMPLANT
FACESHIELD WRAPAROUND OR TEAM (MASK) ×4 IMPLANT
GLOVE BIO SURGEON STRL SZ7.5 (GLOVE) ×3 IMPLANT
GLOVE BIO SURGEON STRL SZ8 (GLOVE) ×6 IMPLANT
GLOVE BIOGEL PI IND STRL 8 (GLOVE) ×2 IMPLANT
GLOVE BIOGEL PI INDICATOR 8 (GLOVE) ×4
GOWN STRL REUS W/TWL LRG LVL3 (GOWN DISPOSABLE) ×3 IMPLANT
GOWN STRL REUS W/TWL XL LVL3 (GOWN DISPOSABLE) ×3 IMPLANT
KIT BASIN OR (CUSTOM PROCEDURE TRAY) ×3 IMPLANT
NDL SAFETY ECLIPSE 18X1.5 (NEEDLE) ×1 IMPLANT
NEEDLE HYPO 18GX1.5 SHARP (NEEDLE) ×3
PACK TOTAL JOINT (CUSTOM PROCEDURE TRAY) ×3 IMPLANT
PEN SKIN MARKING BROAD (MISCELLANEOUS) ×3 IMPLANT
STRIP CLOSURE SKIN 1/2X4 (GAUZE/BANDAGES/DRESSINGS) ×2 IMPLANT
SUT ETHIBOND NAB CT1 #1 30IN (SUTURE) ×3 IMPLANT
SUT MNCRL AB 4-0 PS2 18 (SUTURE) ×3 IMPLANT
SUT VIC AB 2-0 CT1 27 (SUTURE) ×9
SUT VIC AB 2-0 CT1 27XBRD (SUTURE) IMPLANT
SUT VIC AB 2-0 CT1 TAPERPNT 27 (SUTURE) ×2 IMPLANT
SUT VIC AB 2-0 CT2 27 (SUTURE) ×2 IMPLANT
SUT VLOC 180 0 24IN GS25 (SUTURE) ×3 IMPLANT
SYR 30ML LL (SYRINGE) ×3 IMPLANT
SYR 50ML LL SCALE MARK (SYRINGE) IMPLANT
TAPE STRIPS DRAPE STRL (GAUZE/BANDAGES/DRESSINGS) ×2 IMPLANT
TOWEL OR 17X26 10 PK STRL BLUE (TOWEL DISPOSABLE) ×3 IMPLANT
TOWEL OR NON WOVEN STRL DISP B (DISPOSABLE) IMPLANT
TRAY FOLEY W/METER SILVER 14FR (SET/KITS/TRAYS/PACK) ×3 IMPLANT

## 2014-10-11 NOTE — Anesthesia Postprocedure Evaluation (Signed)
  Anesthesia Post-op Note  Patient: Kristin Hood  Procedure(s) Performed: Procedure(s) (LRB): LEFT TOTAL HIP ARTHROPLASTY ANTERIOR APPROACH (Left)  Patient Location: PACU  Anesthesia Type: General  Level of Consciousness: awake and alert   Airway and Oxygen Therapy: Patient Spontanous Breathing  Post-op Pain: mild  Post-op Assessment: Post-op Vital signs reviewed, Patient's Cardiovascular Status Stable, Respiratory Function Stable, Patent Airway and No signs of Nausea or vomiting  Last Vitals:  Filed Vitals:   10/11/14 1735  BP: 107/57  Pulse: 79  Temp: 36.8 C  Resp: 16    Post-op Vital Signs: stable   Complications: No apparent anesthesia complications

## 2014-10-11 NOTE — Interval H&P Note (Signed)
History and Physical Interval Note:  10/11/2014 1:53 PM  Kristin Hood  has presented today for surgery, with the diagnosis of OA OF LEFT HIP  The various methods of treatment have been discussed with the patient and family. After consideration of risks, benefits and other options for treatment, the patient has consented to  Procedure(s): LEFT TOTAL HIP ARTHROPLASTY ANTERIOR APPROACH (Left) as a surgical intervention .  The patient's history has been reviewed, patient examined, no change in status, stable for surgery.  I have reviewed the patient's chart and labs.  Questions were answered to the patient's satisfaction.     Gearlean Alf

## 2014-10-11 NOTE — Plan of Care (Signed)
Problem: Consults Goal: Diagnosis- Total Joint Replacement Primary Total Hip     

## 2014-10-11 NOTE — H&P (View-Only) (Signed)
Kristin Hood DOB: 11-May-1940 Married / Language: English / Race: White Female Date of Admission:  10/11/2014 CC: Left Hip Pain History of Present Illness  The patient is a 74 year old female who comes in for a preoperative History and Physical. The patient is scheduled for a left total hip arthroplasty (anterior) to be performed by Dr. Dione Plover. Aluisio, MD at [facility] on 10-11-2014. The patient is a 74 year old female who presented with a hip problem. The patient reports left hip problems including pain. The symptoms began without any known injury. Prior to being seen, the patient was previously evaluated by a colleague Dr. Noemi Chapel. Previous treatment for this problem has included corticosteroid injection (dexamethasone, which she states only helped for about a week and a half). Kristin Hood has developed significant left hip pain the past several months. She does not recall an injury leading to this. Pain is in the left groin and lateral hip radiating down to her knee. She has had an x-ray and MRI. She states that it hurts at all times. It is definitely limiting what she can and cannot do. She has advanced end-stage arthritis, left hip, progressive in nature. It appears that she has a rapidly progressive osteoarthritis. At this point the most predictable means of getting her better would be total hip arthroplasty. She is ready to proceed with surgery. They have been treated conservatively in the past for the above stated problem and despite conservative measures, they continue to have progressive pain and severe functional limitations and dysfunction. They have failed non-operative management including home exercise, medications, and injections. It is felt that they would benefit from undergoing total joint replacement. Risks and benefits of the procedure have been discussed with the patient and they elect to proceed with surgery. There are no active contraindications to surgery such as  ongoing infection or rapidly progressive neurological disease.  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  Family History  Rheumatoid Arthritis grandfather fathers side Hypertension mother and father Congestive Heart Failure father  Social History  Tobacco / smoke exposure no Pain Contract no Illicit drug use no Tobacco use former smoker; smoke(d) 2 pack(s) per day Number of flights of stairs before winded less than 1 Marital status married Living situation live with spouse Current work status retired Engineer, agricultural (Currently) no Drug/Alcohol Rehab (Previously) no Children 2 Alcohol use current drinker; drinks wine; only occasionally per week Exercise Exercises weekly; does other and gym / weights Advance Directives Living Will, Healthcare POA  Medication History Hydrocodone-Acetaminophen (5-325MG  Tablet, Oral) Active. (alternates with Tylenol) Losartan Potassium (100MG  Tablet, 1 Oral daily) Active. AmLODIPine Besylate (5MG  Tablet, 1 Oral daily) Active. Amoxicillin (500MG  Capsule, Oral) Active. (for dental procedures) Eliquis (5MG  Tablet, 2 Oral daily) Active. Fenofibrate (160MG  Tablet, 1 Oral daily) Active. Atorvastatin Calcium (10MG  Tablet, 1 Oral daily) Active. Fish Oil Concentrate (435MG  Capsule, 980mg  Oral 4 daily) Active. iron (65 mgs per day) Active. Multiple Vitamin (1 (one) Oral) Active. Propranolol HCl (60MG  Tablet, 4 Oral  daily) Active. Ketoconazole (2% Cream, External) Active.  Past Surgical History Appendectomy Breast Biopsy bilateral Cesarean Delivery 2 times Mastectomy left  Tonsillectomy Total Knee Replacement bilateral Foot Surgery - Both bilateral bunionectomies with hammer toes Cataract Extraction-Bilateral   Review of Systems General Not Present- Chills, Fatigue, Fever, Memory Loss, Night Sweats, Weight Gain and Weight Loss. Skin Not Present- Eczema, Hives, Itching, Lesions and Rash. HEENT Not Present- Dentures, Double Vision, Headache, Hearing Loss, Tinnitus and Visual Loss. Respiratory Present- Shortness of breath with exertion. Not Present- Allergies, Chronic Cough, Coughing up blood and Shortness of breath at rest. Cardiovascular Not Present- Chest Pain, Difficulty Breathing Lying Down, Murmur, Palpitations, Racing/skipping heartbeats and Swelling. Gastrointestinal Not Present- Abdominal Pain, Bloody Stool, Constipation, Diarrhea, Difficulty Swallowing, Heartburn, Jaundice, Loss of appetitie, Nausea and Vomiting. Female Genitourinary Present- Urinating at Night. Not Present- Blood in Urine, Discharge, Flank Pain, Incontinence, Painful Urination, Urgency, Urinary frequency, Urinary Retention and Weak urinary stream. Musculoskeletal Present- Joint Pain and Muscle Weakness. Not Present- Back Pain, Joint Swelling, Morning Stiffness, Muscle Pain and Spasms. Neurological Not Present- Blackout spells, Difficulty with balance, Dizziness, Paralysis, Tremor and Weakness. Psychiatric Not Present- Insomnia.  Vitals Height: 65in Height was reported by patient. BP: 118/78 (Sitting, Right Arm, Standard)  Physical Exam General Mental Status -Alert, cooperative and good historian. General Appearance-pleasant, Not in acute distress. Orientation-Oriented X3. Build & Nutrition-Well nourished and Well developed.  Head and Neck Head-normocephalic, atraumatic . Neck Global Assessment  - supple, no bruit auscultated on the right, no bruit auscultated on the left.  Eye Pupil - Bilateral-Regular and Round. Motion - Bilateral-EOMI.  Chest and Lung Exam Auscultation Breath sounds - clear at anterior chest wall and clear at posterior chest wall. Adventitious sounds - No Adventitious sounds.  Cardiovascular Auscultation Rhythm - Regular rate and rhythm. Heart Sounds - S1 WNL and S2 WNL. Murmurs & Other Heart Sounds - Auscultation of the heart reveals - No Murmurs.  Abdomen Inspection Contour - Generalized moderate distention. Palpation/Percussion Tenderness - Abdomen is non-tender to palpation. Rigidity (guarding) - Abdomen is soft. Auscultation Auscultation of the abdomen reveals - Bowel sounds normal.  Female Genitourinary Note: Not done, not pertinent to present illness   Musculoskeletal Note: On exam she is alert and oriented, in no apparent distress. Her right hip has normal range of motion without discomfort. Left hip flexion to about 100, minimal internal rotation, about 20 external rotation, 20 abduction. Pulses, sensation, and motor are intact.  IMAGING She had an MRI in the [facility] system showing severe inflammation in the hip and degenerative change.  She did not have plain films. We took those today, AP pelvis and lateral of the hip, and she has significant bone-on-bone arthritis in that left hip. The right hip looks normal.   Assessment & Plan Primary osteoarthritis of left hip (M16.12)  Note:Surgical Plans: Left Total Hip Replacement - Anterior Approach  Disposition: Home  PCP: Dr. [name] - Patient has been seen preoperatively and felt to be stable for surgery. Cards: [name] - Patient has been seen preoperatively and felt to be stable for surgery. Hold Eliquis 2 days prior to surgery. Resume as soon as possible post op.  Topical TXA - History of Stroke  Anesthesia Issues: None  Signed electronically by Ok Edwards, III PA-C

## 2014-10-11 NOTE — Anesthesia Preprocedure Evaluation (Addendum)
Anesthesia Evaluation  Patient identified by MRN, date of birth, ID band Patient awake    Reviewed: Allergy & Precautions, NPO status , Patient's Chart, lab work & pertinent test results  History of Anesthesia Complications (+) history of anesthetic complications  Airway Mallampati: II  TM Distance: >3 FB Neck ROM: Full    Dental no notable dental hx.    Pulmonary former smoker,  breath sounds clear to auscultation  Pulmonary exam normal       Cardiovascular hypertension, Pt. on medications + Peripheral Vascular Disease Normal cardiovascular exam+ dysrhythmias Atrial Fibrillation Rhythm:Regular Rate:Normal     Neuro/Psych Familial tremor.    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 CVA, Residual Symptoms negative psych ROS   GI/Hepatic negative GI ROS, Neg liver ROS,   Endo/Other  Morbid obesity  Renal/GU negative Renal ROS  negative genitourinary   Musculoskeletal negative musculoskeletal ROS (+)   Abdominal (+) + obese,   Peds negative pediatric ROS (+)  Hematology negative hematology ROS (+)   Anesthesia Other Findings   Reproductive/Obstetrics negative OB ROS                          Anesthesia Physical Anesthesia Plan  ASA: III  Anesthesia Plan: General   Post-op Pain Management:    Induction: Intravenous  Airway Management Planned:   Additional Equipment:   Intra-op Plan:   Post-operative Plan: Extubation in OR  Informed Consent: I have reviewed the patients History and Physical, chart, labs and discussed the procedure including the risks, benefits and alternatives for the proposed anesthesia with the patient or authorized representative who has indicated his/her understanding and acceptance.   Dental advisory given  Plan Discussed with: CRNA  Anesthesia Plan Comments: (Discussed general and spinal anesthesia. Last dose of Eliquis was Sunday night. Recommendation  is for three days off eliquis before spinal placement. Plan general.)       Anesthesia Quick Evaluation

## 2014-10-11 NOTE — Op Note (Signed)
OPERATIVE REPORT  PREOPERATIVE DIAGNOSIS: Osteoarthritis of the Left hip.   POSTOPERATIVE DIAGNOSIS: Osteoarthritis of the Left  hip.   PROCEDURE: Left total hip arthroplasty, anterior approach.   SURGEON: [name], MD   ASSISTANT: Arlee Muslim, PA-C  ANESTHESIA:  General  ESTIMATED BLOOD LOSS:-450 ml   DRAINS: Hemovac x1.   COMPLICATIONS: None   CONDITION: PACU - hemodynamically stable.   BRIEF CLINICAL NOTE: Kristin Hood is a 74 y.o. female who has advanced end-  stage arthritis of her Left  hip with progressively worsening pain and  dysfunction.The patient has failed nonoperative management and presents for  total hip arthroplasty.   PROCEDURE IN DETAIL: After successful administration of spinal  anesthetic, the traction boots for the [facility] bed were placed on both  feet and the patient was placed onto the [facility] bed, boots placed into the leg  holders. The Left hip was then isolated from the perineum with plastic  drapes and prepped and draped in the usual sterile fashion. ASIS and  greater trochanter were marked and a oblique incision was made, starting  at about 1 cm lateral and 2 cm distal to the ASIS and coursing towards  the anterior cortex of the femur. The skin was cut with a 10 blade  through subcutaneous tissue to the level of the fascia overlying the  tensor fascia lata muscle. The fascia was then incised in line with the  incision at the junction of the anterior third and posterior 2/3rd. The  muscle was teased off the fascia and then the interval between the TFL  and the rectus was developed. The Hohmann retractor was then placed at  the top of the femoral neck over the capsule. The vessels overlying the  capsule were cauterized and the fat on top of the capsule was removed.  A Hohmann retractor was then placed anterior underneath the rectus  femoris to give exposure to the entire anterior capsule. A T-shaped  capsulotomy was performed. The  edges were tagged and the femoral head  was identified.       Osteophytes are removed off the superior acetabulum.  The femoral neck was then cut in situ with an oscillating saw. Traction  was then applied to the left lower extremity utilizing the [facility]  traction. The femoral head was then removed. Retractors were placed  around the acetabulum and then circumferential removal of the labrum was  performed. Osteophytes were also removed. Reaming starts at 45 mm to  medialize and  Increased in 2 mm increments to 49 mm. We reamed in  approximately 40 degrees of abduction, 20 degrees anteversion. A 50 mm  pinnacle acetabular shell was then impacted in anatomic position under  fluoroscopic guidance with excellent purchase. We did not need to place  any additional dome screws. A 32 mm neutral + 4 marathon liner was then  placed into the acetabular shell.       The femoral lift was then placed along the lateral aspect of the femur  just distal to the vastus ridge. The leg was  externally rotated and capsule  was stripped off the inferior aspect of the femoral neck down to the  level of the lesser trochanter, this was done with electrocautery. The femur was lifted after this was performed. The  leg was then placed and extended in adducted position to essentially delivering the femur. We also removed the capsule superiorly and the  piriformis from the piriformis  fossa to gain excellent exposure of the  proximal femur. Rongeur was used to remove some cancellous bone to get  into the lateral portion of the proximal femur for placement of the  initial starter reamer. The starter broaches was placed  the starter broach  and was shown to go down the center of the canal. Broaching  with the  Corail system was then performed starting at size 8, coursing  Up to size 11. A size 11 had excellent torsional and rotational  and axial stability. The trial standard offset neck was then placed  with a 32 + 1 trial  head. The hip was then reduced. We confirmed that  the stem was in the canal both on AP and lateral x-rays. It also has excellent sizing. The hip was reduced with outstanding stability through full extension, full external rotation,  and then flexion in adduction internal rotation. AP pelvis was taken  and the leg lengths were measured and found to be exactly equal. Hip  was then dislocated again and the femoral head and neck removed. The  femoral broach was removed. Size 11 Corail stem with a standard offset  neck was then impacted into the femur following native anteversion. Has  excellent purchase in the canal. Excellent torsional and rotational and  axial stability. It is confirmed to be in the canal on AP and lateral  fluoroscopic views. The 32 + 1 ceramic head was placed and the hip  reduced with outstanding stability. Again AP pelvis was taken and it  confirmed that the leg lengths were equal. The wound was then copiously  irrigated with saline solution and the capsule reattached and repaired  with Ethibond suture.  20 mL of Exparel mixed with 50 mL of saline then additional 20 ml of .25% Bupivicaine injected into the capsule and into the edge of the tensor fascia lata as well as subcutaneous tissue. The fascia overlying the tensor fascia lata was  then closed with a running #1 V-Loc. Subcu was closed with interrupted  2-0 Vicryl and subcuticular running 4-0 Monocryl. Incision was cleaned  and dried. Steri-Strips and a bulky sterile dressing applied. Hemovac  drain was hooked to suction and then he was awakened and transported to  recovery in stable condition.        Please note that a surgical assistant was a medical necessity for this procedure to perform it in a safe and expeditious manner. Assistant was necessary to provide appropriate retraction of vital neurovascular structures and to prevent femoral fracture and allow for anatomic placement of the prosthesis.  [name], M.D.

## 2014-10-11 NOTE — Transfer of Care (Signed)
Immediate Anesthesia Transfer of Care Note  Patient: Kristin Hood  Procedure(s) Performed: Procedure(s): LEFT TOTAL HIP ARTHROPLASTY ANTERIOR APPROACH (Left)  Patient Location: PACU  Anesthesia Type:General  Level of Consciousness:  sedated, patient cooperative and responds to stimulation  Airway & Oxygen Therapy:Patient Spontanous Breathing and Patient connected to face mask oxgen  Post-op Assessment:  Report given to PACU RN and Post -op Vital signs reviewed and stable  Post vital signs:  Reviewed and stable  Last Vitals:  Filed Vitals:   10/11/14 1258  BP: 148/87  Pulse: 81  Temp: 36.6 C  Resp: 20    Complications: No apparent anesthesia complications

## 2014-10-11 NOTE — Anesthesia Procedure Notes (Signed)
Procedure Name: Intubation Date/Time: 10/11/2014 2:12 PM Performed by: Lajuana Carry E Pre-anesthesia Checklist: Patient identified, Emergency Drugs available, Suction available and Patient being monitored Patient Re-evaluated:Patient Re-evaluated prior to inductionOxygen Delivery Method: Circle System Utilized Preoxygenation: Pre-oxygenation with 100% oxygen Intubation Type: IV induction Ventilation: Mask ventilation without difficulty Laryngoscope Size: Miller and 2 Grade View: Grade I Tube type: Oral Tube size: 6.5 mm Number of attempts: 1 Airway Equipment and Method: Stylet Placement Confirmation: ETT inserted through vocal cords under direct vision,  positive ETCO2 and breath sounds checked- equal and bilateral Secured at: 20 cm Tube secured with: Tape Dental Injury: Teeth and Oropharynx as per pre-operative assessment

## 2014-10-11 NOTE — Progress Notes (Signed)
Utilization review completed.  

## 2014-10-12 ENCOUNTER — Encounter (HOSPITAL_COMMUNITY): Payer: Self-pay | Admitting: Orthopedic Surgery

## 2014-10-12 LAB — BASIC METABOLIC PANEL
Anion gap: 7 (ref 5–15)
BUN: 22 mg/dL — ABNORMAL HIGH (ref 6–20)
CO2: 25 mmol/L (ref 22–32)
Calcium: 9 mg/dL (ref 8.9–10.3)
Chloride: 104 mmol/L (ref 101–111)
Creatinine, Ser: 0.82 mg/dL (ref 0.44–1.00)
Glucose, Bld: 172 mg/dL — ABNORMAL HIGH (ref 65–99)
POTASSIUM: 4.7 mmol/L (ref 3.5–5.1)
SODIUM: 136 mmol/L (ref 135–145)

## 2014-10-12 LAB — CBC
HEMATOCRIT: 31.2 % — AB (ref 36.0–46.0)
HEMOGLOBIN: 10 g/dL — AB (ref 12.0–15.0)
MCH: 28.1 pg (ref 26.0–34.0)
MCHC: 32.1 g/dL (ref 30.0–36.0)
MCV: 87.6 fL (ref 78.0–100.0)
Platelets: 209 10*3/uL (ref 150–400)
RBC: 3.56 MIL/uL — ABNORMAL LOW (ref 3.87–5.11)
RDW: 14.5 % (ref 11.5–15.5)
WBC: 5.1 10*3/uL (ref 4.0–10.5)

## 2014-10-12 MED ORDER — METHOCARBAMOL 500 MG PO TABS: 500.0000 mg | ORAL_TABLET | Freq: Four times a day (QID) | ORAL | Status: DC | PRN

## 2014-10-12 MED ORDER — TRAMADOL HCL 50 MG PO TABS: 50.0000 mg | ORAL_TABLET | Freq: Four times a day (QID) | ORAL | Status: DC | PRN

## 2014-10-12 MED ORDER — OXYCODONE HCL 5 MG PO TABS: 5.0000 mg | ORAL_TABLET | ORAL | Status: DC | PRN

## 2014-10-12 MED ORDER — SODIUM CHLORIDE 0.9 % IV BOLUS (SEPSIS)
250.0000 mL | Freq: Once | INTRAVENOUS | Status: AC
Start: 2014-10-12 — End: 2014-10-12
  Administered 2014-10-12: 250 mL via INTRAVENOUS

## 2014-10-12 NOTE — Progress Notes (Signed)
   Subjective: 1 Day Post-Op Procedure(s) (LRB): LEFT TOTAL HIP ARTHROPLASTY ANTERIOR APPROACH (Left) Patient reports pain as mild.   Patient seen in rounds with Dr. Wynelle Link.  Doing okay this morning.  Family in room. Patient is well, but has had some minor complaints of pain in the hip, requiring pain medications We will start therapy today.  Plan is to go Home after hospital stay.  Objective: Vital signs in last 24 hours: Temp:  [97.5 F (36.4 C)-98.6 F (37 C)] 98.4 F (36.9 C) (07/14 0640) Pulse Rate:  [67-88] 73 (07/14 0640) Resp:  [13-20] 16 (07/14 0640) BP: (93-148)/(51-107) 93/63 mmHg (07/14 0640) SpO2:  [93 %-100 %] 97 % (07/14 0640) Weight:  [120.657 kg (266 lb)] 120.657 kg (266 lb) (07/13 1258)  Intake/Output from previous day:  Intake/Output Summary (Last 24 hours) at 10/12/14 0803 Last data filed at 10/12/14 0640  Gross per 24 hour  Intake 3827.92 ml  Output   2145 ml  Net 1682.92 ml    Intake/Output this shift:    Labs:  Recent Labs  10/12/14 0435  HGB 10.0*    Recent Labs  10/12/14 0435  WBC 5.1  RBC 3.56*  HCT 31.2*  PLT 209    Recent Labs  10/12/14 0435  NA 136  K 4.7  CL 104  CO2 25  BUN 22*  CREATININE 0.82  GLUCOSE 172*  CALCIUM 9.0    Recent Labs  10/11/14 1330  INR 1.09    EXAM General - Patient is Alert, Appropriate and Oriented Extremity - Neurovascular intact Sensation intact distally Dorsiflexion/Plantar flexion intact Dressing - dressing C/D/I Motor Function - intact, moving foot and toes well on exam.  Hemovac pulled without difficulty.  Past Medical History  Diagnosis Date  . Hypertension     , With mild concentric left ventricular hypertrophy  . Shingles   . Atrial fibrillation 07/2012    Eliquis, rate controlled  . Hx of adenomatous colonic polyps 2006    , Benign  . Mixed dyslipidemia   . Coarse tremors     , Essential  . Hypercholesteremia   . Breast cancer 2001    left mastectomy  .  Complication of anesthesia     SMALLER TUBE FOR INTUBATION AS GAGS ON TUBE  . Dysrhythmia 08/20/2012    Atrial Fibrillation  . 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 CVA (cerebral vascular accident) 2013  . Valvular sclerosis 09/23/2003    without stenosis  . History of chemotherapy 11/13/1998 to 2010    Adriamycin/Docotaxol, Dexorubicin/Taxotere, Femara, Tamoxifen    Assessment/Plan: 1 Day Post-Op Procedure(s) (LRB): LEFT TOTAL HIP ARTHROPLASTY ANTERIOR APPROACH (Left) Principal Problem:   OA (osteoarthritis) of hip  Estimated body mass index is 42.95 kg/(m^2) as calculated from the following:   Height as of this encounter: 5\' 6"  (1.676 m).   Weight as of this encounter: 120.657 kg (266 lb). Advance diet Up with therapy Plan for discharge tomorrow Discharge home with home health  DVT Prophylaxis - Eliquis Weight Bearing As Tolerated left Leg Hemovac Pulled Begin Therapy  Arlee Muslim, PA-C Orthopaedic Surgery 10/12/2014, 8:03 AM

## 2014-10-12 NOTE — Evaluation (Signed)
Physical Therapy Evaluation Patient Details Name: Kristin Hood MRN: [id number] DOB: 1940/09/27 Today's Date: 10/12/2014   History of Present Illness  Pt is s/p L direct anteior THA; history of bilateral TKA  Clinical Impression  Pt s/p L THR presents with decreased L LE strength/ROM and post op pain limiting functional mobility.  Pt should progress to dc home with family assist.    Follow Up Recommendations Home health PT    Equipment Recommendations  None recommended by PT    Recommendations for Other Services OT consult     Precautions / Restrictions Precautions Precautions: Fall Restrictions Weight Bearing Restrictions: No Other Position/Activity Restrictions: WBAT      Mobility  Bed Mobility Overal bed mobility: Needs Assistance Bed Mobility: Supine to Sit     Supine to sit: Mod assist     General bed mobility comments: cues for sequence and use of R LE to self assist.  Physical assist to manage L LE and to bring trunk to upright  Transfers Overall transfer level: Needs assistance Equipment used: Rolling walker (2 wheeled) Transfers: Sit to/from Stand Sit to Stand: Min assist;From elevated surface         General transfer comment: cues for LE management and use of UEs to self assist  Ambulation/Gait Ambulation/Gait assistance: Min assist Ambulation Distance (Feet): 65 Feet Assistive device: Rolling walker (2 wheeled) Gait Pattern/deviations: Step-to pattern;Decreased step length - right;Decreased step length - left;Shuffle;Trunk flexed     General Gait Details: cues for sequence, posture and position from [employer]            Wheelchair Mobility    Modified Rankin (Stroke Patients Only)       Balance                                             Pertinent Vitals/Pain Pain Assessment: 0-10 Pain Score: 3  Pain Location: L hip Pain Descriptors / Indicators: Aching Pain Intervention(s): Repositioned    Home  Living Family/patient expects to be discharged to:: Private residence Living Arrangements: Spouse/significant other Available Help at Discharge: Family Type of Home: House Home Access: Stairs to enter Entrance Stairs-Rails: None Entrance Stairs-Number of Steps: 1 Home Layout: One level Home Equipment: Walker - standard;Bedside commode      Prior Function Level of Independence: Needs assistance      ADL's / Homemaking Assistance Needed: spouse assists with household tasks of cooking. have hired assist for cleaning.        Hand Dominance        Extremity/Trunk Assessment   Upper Extremity Assessment: Overall WFL for tasks assessed (note pt shaky in UEs and voice trembly. pt states this is present PTA)           Lower Extremity Assessment: LLE deficits/detail   LLE Deficits / Details: 3-/5 hip strength with AAROM at R hip to 90 flex and 20 abd  Cervical / Trunk Assessment: Normal  Communication   Communication: No difficulties  Cognition Arousal/Alertness: Awake/alert Behavior During Therapy: WFL for tasks assessed/performed Overall Cognitive Status: Within Functional Limits for tasks assessed                      General Comments      Exercises Total Joint Exercises Ankle Circles/Pumps: AROM;Both;15 reps;Supine Quad Sets: AROM;Both;10 reps;Supine Heel Slides: AAROM;Left;20 reps;Supine Hip ABduction/ADduction: AAROM;Left;10 reps;Supine  Assessment/Plan    PT Assessment Patient needs continued PT services  PT Diagnosis Difficulty walking   PT Problem List Decreased strength;Decreased range of motion;Decreased activity tolerance;Decreased mobility;Decreased knowledge of use of DME;Obesity;Pain  PT Treatment Interventions DME instruction;Gait training;Stair training;Functional mobility training;Therapeutic activities;Therapeutic exercise;Patient/family education   PT Goals (Current goals can be found in the Care Plan section) Acute Rehab PT  Goals Patient Stated Goal: resume previous lifestyle with decreased pain PT Goal Formulation: With patient Time For Goal Achievement: 10/14/14 Potential to Achieve Goals: Good    Frequency 7X/week   Barriers to discharge        Co-evaluation               End of Session Equipment Utilized During Treatment: Gait belt Activity Tolerance: Patient tolerated treatment well Patient left: Other (comment) (standing with OT to go into bathroom) Nurse Communication: Mobility status         Time: 1040-1107 PT Time Calculation (min) (ACUTE ONLY): 27 min   Charges:     PT Treatments $Therapeutic Exercise: 8-22 mins   PT G Codes:        Chaise Passarella Oct 18, 2014, 12:41 PM

## 2014-10-12 NOTE — Progress Notes (Signed)
Physical Therapy Treatment Patient Details Name: Kristin Hood MRN: [id number] DOB: 1941-02-24 Today's Date: 10/12/2014    History of Present Illness Pt is s/p L direct anteior THA; history of bilateral TKA    PT Comments    Steady progress with mobility.  Pt hopeful for dc tomorrow  Follow Up Recommendations  Home health PT     Equipment Recommendations  None recommended by PT    Recommendations for Other Services OT consult     Precautions / Restrictions Precautions Precautions: Fall Restrictions Weight Bearing Restrictions: No Other Position/Activity Restrictions: WBAT    Mobility  Bed Mobility Overal bed mobility: Needs Assistance Bed Mobility: Sit to Supine       Sit to supine: Min assist   General bed mobility comments: cues for sequence and use of R LE to self assist.  Physical assist to manage L LE and to bring trunk to upright  Transfers Overall transfer level: Needs assistance Equipment used: Rolling walker (2 wheeled) Transfers: Sit to/from Stand Sit to Stand: Min assist         General transfer comment: cues for hand placement and LE management.   Ambulation/Gait Ambulation/Gait assistance: Min assist;Min guard Ambulation Distance (Feet): 123 Feet Assistive device: Rolling walker (2 wheeled) Gait Pattern/deviations: Step-to pattern;Decreased step length - right;Decreased step length - left;Shuffle;Trunk flexed Gait velocity: decr   General Gait Details: cues for sequence, posture and position from [employer]            Wheelchair Mobility    Modified Rankin (Stroke Patients Only)       Balance                                    Cognition Arousal/Alertness: Awake/alert Behavior During Therapy: WFL for tasks assessed/performed Overall Cognitive Status: Within Functional Limits for tasks assessed                      Exercises Total Joint Exercises Ankle Circles/Pumps: AROM;Both;15  reps;Supine Quad Sets: AROM;Both;10 reps;Supine Heel Slides: AAROM;Left;20 reps;Supine Hip ABduction/ADduction: AAROM;Left;10 reps;Supine    General Comments        Pertinent Vitals/Pain Pain Assessment: 0-10 Pain Score: 4  Pain Location: L hip Pain Descriptors / Indicators: Aching;Sore Pain Intervention(s): Limited activity within patient's tolerance;Monitored during session;Premedicated before session;Ice applied    Home Living Family/patient expects to be discharged to:: Private residence Living Arrangements: Spouse/significant other Available Help at Discharge: Family Type of Home: House       Home Equipment: Walker - standard;Bedside commode      Prior Function Level of Independence: Needs assistance    ADL's / Homemaking Assistance Needed: spouse assists with household tasks of cooking. have hired assist for cleaning.     PT Goals (current goals can now be found in the care plan section) Acute Rehab PT Goals Patient Stated Goal: return to independence.  PT Goal Formulation: With patient Time For Goal Achievement: 10/14/14 Potential to Achieve Goals: Good Progress towards PT goals: Progressing toward goals    Frequency  7X/week    PT Plan Current plan remains appropriate    Co-evaluation             End of Session Equipment Utilized During Treatment: Gait belt Activity Tolerance: Patient tolerated treatment well Patient left: in bed;with call bell/phone within reach;with family/visitor present     Time: 1332-1400 PT Time Calculation (min) (  ACUTE ONLY): 28 min  Charges:  $Gait Training: 8-22 mins $Therapeutic Exercise: 8-22 mins                    G Codes:      Evanthia Maund Nov 03, 2014, 3:09 PM

## 2014-10-12 NOTE — Evaluation (Signed)
Occupational Therapy Evaluation Patient Details Name: Kristin Hood MRN: [id number] DOB: 1940/05/09 Today's Date: 10/12/2014    History of Present Illness Pt is s/p L direct anteior THA; history of bilateral TKA   Clinical Impression   Pt doing well with functional mobility but noted to be SOB with activity. Her O2 sats on RA after toileting task fluctuated between 88-93% . Even after several minutes of rest, O2 sats still dipped to 88% at times. Encouraged PLB with activity and not to hold her breath which she states she has a tendency to do so. Replaced O2 at end of session and nursing aware-O2 sats at 93%. Pt with 2/4 dyspnea even with talking also. Will follow on acute to progress ADL independence for return home with spouse assisting.     Follow Up Recommendations  No OT follow up;Supervision/Assistance - 24 hour    Equipment Recommendations  None recommended by OT    Recommendations for Other Services       Precautions / Restrictions Precautions Precautions: Fall Restrictions Weight Bearing Restrictions: No Other Position/Activity Restrictions: WBAT      Mobility Bed Mobility          Not tested. Pt  OOB with PT.   Transfers Overall transfer level: Needs assistance Equipment used: Rolling walker (2 wheeled) Transfers: Sit to/from Stand Sit to Stand: Min assist         General transfer comment: cues for hand placement and LE management.     Balance                                            ADL Overall ADL's : Needs assistance/impaired Eating/Feeding: Independent;Sitting   Grooming: Wash/dry hands;Set up;Sitting   Upper Body Bathing: Set up;Sitting   Lower Body Bathing: Moderate assistance;Sit to/from stand   Upper Body Dressing : Set up;Sitting   Lower Body Dressing: Maximal assistance;Sit to/from stand   Toilet Transfer: Minimal assistance;Ambulation;BSC;RW   Toileting- Clothing Manipulation and Hygiene: Moderate  assistance;Sit to/from stand;Minimal assistance         General ADL Comments: family present for session. Husband will be the caregiver assisting with ADL. Pt declines need for AE and states he will assist with LB self care. Pt noted to have 2/4 dyspnea during and after activity. O2 sats on RA after activity fluctuated between 88 and 93%. Nursing requested to replace O2 at end of session and O2 up to 93%. pt noted to be shaky in UEs and voice trembly but pt states this is not new for her.      Vision     Perception     Praxis      Pertinent Vitals/Pain Pain Assessment: 0-10 Pain Score: 3  Pain Location: L hip Pain Descriptors / Indicators: Aching Pain Intervention(s): Repositioned     Hand Dominance     Extremity/Trunk Assessment Upper Extremity Assessment Upper Extremity Assessment: Overall WFL for tasks assessed (note pt shaky in UEs and voice trembly. pt states this is present PTA)      Cervical / Trunk Assessment Cervical / Trunk Assessment: Normal   Communication Communication Communication: No difficulties   Cognition Arousal/Alertness: Awake/alert Behavior During Therapy: WFL for tasks assessed/performed Overall Cognitive Status: Within Functional Limits for tasks assessed                     General Comments  Exercises      Shoulder Instructions      Home Living Family/patient expects to be discharged to:: Private residence Living Arrangements: Spouse/significant other Available Help at Discharge: Family Type of Home: House Home Access: Stairs to enter Technical brewer of Steps: 1 Entrance Stairs-Rails: None Home Layout: One level     Bathroom Shower/Tub: Occupational psychologist: Standard     Home Equipment: Environmental consultant - standard;Bedside commode          Prior Functioning/Environment Level of Independence: Needs assistance    ADL's / Homemaking Assistance Needed: spouse assists with household tasks of cooking.  have hired assist for cleaning.        OT Diagnosis: Generalized weakness   OT Problem List: Decreased strength;Decreased knowledge of use of DME or AE   OT Treatment/Interventions: Self-care/ADL training;Patient/family education;Therapeutic activities;DME and/or AE instruction    OT Goals(Current goals can be found in the care plan section) Acute Rehab OT Goals Patient Stated Goal: return to independence.  OT Goal Formulation: With patient/family Time For Goal Achievement: 10/19/14 Potential to Achieve Goals: Good  OT Frequency: Min 2X/week   Barriers to D/C:            Co-evaluation              End of Session Equipment Utilized During Treatment: Rolling walker;Other (comment) (replaced O2 at end of session)  Activity Tolerance: Patient tolerated treatment well Patient left: in chair;with call bell/phone within reach   Time: 0109-3235 OT Time Calculation (min): 32 min Charges:  OT General Charges $OT Visit: 1 Procedure OT Evaluation $Initial OT Evaluation Tier I: 1 Procedure OT Treatments $Therapeutic Activity: 8-22 mins G-Codes:    Jules Schick  [phone] 10/12/2014, 12:44 PM

## 2014-10-12 NOTE — Discharge Summary (Signed)
Physician Discharge Summary   Patient ID: AMERE IOTT MRN: [id number] DOB/AGE: 1940/06/17 74 y.o.  Admit date: 10/11/2014 Discharge date: 10/13/2014  Primary Diagnosis:  Osteoarthritis of the Left hip.   Admission Diagnoses:  Past Medical History  Diagnosis Date  . Hypertension     , With mild concentric left ventricular hypertrophy  . Shingles   . Atrial fibrillation 07/2012    Eliquis, rate controlled  . Hx of adenomatous colonic polyps 2006    , Benign  . Mixed dyslipidemia   . Coarse tremors     , Essential  . Hypercholesteremia   . Breast cancer 2001    left mastectomy  . Complication of anesthesia     SMALLER TUBE FOR INTUBATION AS GAGS ON TUBE  . Dysrhythmia 08/20/2012    Atrial Fibrillation  . 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 CVA (cerebral vascular accident) 2013  . Valvular sclerosis 09/23/2003    without stenosis  . History of chemotherapy 11/13/1998 to 2010    Adriamycin/Docotaxol, Dexorubicin/Taxotere, Femara, Tamoxifen   Discharge Diagnoses:   Principal Problem:   OA (osteoarthritis) of hip  Estimated body mass index is 42.95 kg/(m^2) as calculated from the following:   Height as of this encounter: '5\' 6"'  (1.676 m).   Weight as of this encounter: 120.657 kg (266 lb).  Procedure(s) (LRB): LEFT TOTAL HIP ARTHROPLASTY ANTERIOR APPROACH (Left)   Consults: None  HPI: Kristin Hood is a 74 y.o. female who has advanced end-  stage arthritis of her Left hip with progressively worsening pain and  dysfunction.The patient has failed nonoperative management and presents for  total hip arthroplasty.   Laboratory Data: Admission on 10/11/2014  Component Date Value Ref Range Status  . Prothrombin Time 10/11/2014 14.3  11.6 - 15.2 seconds Final  . INR 10/11/2014 1.09  0.00 - 1.49 Final  . WBC 10/12/2014 5.1  4.0 - 10.5 K/uL Final  . RBC 10/12/2014 3.56* 3.87 - 5.11 MIL/uL Final  . Hemoglobin 10/12/2014 10.0* 12.0 - 15.0 g/dL Final   . HCT 10/12/2014 31.2* 36.0 - 46.0 % Final  . MCV 10/12/2014 87.6  78.0 - 100.0 fL Final  . MCH 10/12/2014 28.1  26.0 - 34.0 pg Final  . MCHC 10/12/2014 32.1  30.0 - 36.0 g/dL Final  . RDW 10/12/2014 14.5  11.5 - 15.5 % Final  . Platelets 10/12/2014 209  150 - 400 K/uL Final  . Sodium 10/12/2014 136  135 - 145 mmol/L Final  . Potassium 10/12/2014 4.7  3.5 - 5.1 mmol/L Final  . Chloride 10/12/2014 104  101 - 111 mmol/L Final  . CO2 10/12/2014 25  22 - 32 mmol/L Final  . Glucose, Bld 10/12/2014 172* 65 - 99 mg/dL Final  . BUN 10/12/2014 22* 6 - 20 mg/dL Final  . Creatinine, Ser 10/12/2014 0.82  0.44 - 1.00 mg/dL Final  . Calcium 10/12/2014 9.0  8.9 - 10.3 mg/dL Final  . GFR calc non Af Amer 10/12/2014 >60  >60 mL/min Final  . GFR calc Af Amer 10/12/2014 >60  >60 mL/min Final   Comment: (NOTE) The eGFR has been calculated using the CKD EPI equation. This calculation has not been validated in all clinical situations. eGFR's persistently <60 mL/min signify possible Chronic Kidney Disease.   Georgiann Hahn gap 10/12/2014 7  5 - 15 Final  Hospital Outpatient Visit on 10/04/2014  Component Date Value Ref Range Status  . aPTT 10/04/2014 40* 24 - 37 seconds Final  Comment:        IF BASELINE aPTT IS ELEVATED, SUGGEST PATIENT RISK ASSESSMENT BE USED TO DETERMINE APPROPRIATE ANTICOAGULANT THERAPY.   . WBC 10/04/2014 3.3* 4.0 - 10.5 K/uL Final  . RBC 10/04/2014 4.20  3.87 - 5.11 MIL/uL Final  . Hemoglobin 10/04/2014 12.1  12.0 - 15.0 g/dL Final  . HCT 10/04/2014 38.0  36.0 - 46.0 % Final  . MCV 10/04/2014 90.5  78.0 - 100.0 fL Final  . MCH 10/04/2014 28.8  26.0 - 34.0 pg Final  . MCHC 10/04/2014 31.8  30.0 - 36.0 g/dL Final  . RDW 10/04/2014 15.0  11.5 - 15.5 % Final  . Platelets 10/04/2014 241  150 - 400 K/uL Final  . Sodium 10/04/2014 140  135 - 145 mmol/L Final  . Potassium 10/04/2014 5.1  3.5 - 5.1 mmol/L Final  . Chloride 10/04/2014 106  101 - 111 mmol/L Final  . CO2 10/04/2014 28  22  - 32 mmol/L Final  . Glucose, Bld 10/04/2014 109* 65 - 99 mg/dL Final  . BUN 10/04/2014 19  6 - 20 mg/dL Final  . Creatinine, Ser 10/04/2014 0.67  0.44 - 1.00 mg/dL Final  . Calcium 10/04/2014 9.6  8.9 - 10.3 mg/dL Final  . Total Protein 10/04/2014 7.8  6.5 - 8.1 g/dL Final  . Albumin 10/04/2014 3.9  3.5 - 5.0 g/dL Final  . AST 10/04/2014 32  15 - 41 U/L Final  . ALT 10/04/2014 22  14 - 54 U/L Final  . Alkaline Phosphatase 10/04/2014 35* 38 - 126 U/L Final  . Total Bilirubin 10/04/2014 1.0  0.3 - 1.2 mg/dL Final  . GFR calc non Af Amer 10/04/2014 >60  >60 mL/min Final  . GFR calc Af Amer 10/04/2014 >60  >60 mL/min Final   Comment: (NOTE) The eGFR has been calculated using the CKD EPI equation. This calculation has not been validated in all clinical situations. eGFR's persistently <60 mL/min signify possible Chronic Kidney Disease.   . Anion gap 10/04/2014 6  5 - 15 Final  . Prothrombin Time 10/04/2014 15.9* 11.6 - 15.2 seconds Final  . INR 10/04/2014 1.26  0.00 - 1.49 Final  . Color, Urine 10/04/2014 YELLOW  YELLOW Final  . APPearance 10/04/2014 CLOUDY* CLEAR Final  . Specific Gravity, Urine 10/04/2014 1.025  1.005 - 1.030 Final  . pH 10/04/2014 6.5  5.0 - 8.0 Final  . Glucose, UA 10/04/2014 NEGATIVE  NEGATIVE mg/dL Final  . Hgb urine dipstick 10/04/2014 NEGATIVE  NEGATIVE Final  . Bilirubin Urine 10/04/2014 NEGATIVE  NEGATIVE Final  . Ketones, ur 10/04/2014 NEGATIVE  NEGATIVE mg/dL Final  . Protein, ur 10/04/2014 NEGATIVE  NEGATIVE mg/dL Final  . Urobilinogen, UA 10/04/2014 1.0  0.0 - 1.0 mg/dL Final  . Nitrite 10/04/2014 NEGATIVE  NEGATIVE Final  . Leukocytes, UA 10/04/2014 MODERATE* NEGATIVE Final  . MRSA, PCR 10/04/2014 NEGATIVE  NEGATIVE Final  . Staphylococcus aureus 10/04/2014 NEGATIVE  NEGATIVE Final   Comment:        The Xpert SA Assay (FDA approved for NASAL specimens in patients over 42 years of age), is one component of a comprehensive surveillance program.  Test  performance has been validated by [facility] for patients greater than or equal to 5 year old. It is not intended to diagnose infection nor to guide or monitor treatment.   . ABO/RH(D) 10/04/2014 A POS   Final  . Antibody Screen 10/04/2014 NEG   Final  . Sample Expiration 10/04/2014 10/14/2014   Final  .  Squamous Epithelial / LPF 10/04/2014 RARE  RARE Final  . WBC, UA 10/04/2014 21-50  <3 WBC/hpf Final  . RBC / HPF 10/04/2014 0-2  <3 RBC/hpf Final  . Bacteria, UA 10/04/2014 MANY* RARE Final     X-Rays:Dg Pelvis Portable  10/11/2014   CLINICAL DATA:  Status post left hip replacement  EXAM: PORTABLE PELVIS 1-2 VIEWS  COMPARISON:  MRI left hip of 04/25/2014  FINDINGS: The femoral and acetabular components of the left total hip replacement appear to be in good position and alignment. No complicating features are seen. Mild degenerative change of the right hip is noted. The pelvic rami are intact.  IMPRESSION: Left total hip replacement components in good position with no complicating features.   Electronically Signed   By: Ivar Drape M.D.   On: 10/11/2014 16:36   Dg C-arm 1-60 Min-no Report  10/11/2014   CLINICAL DATA: left anterior hip   C-ARM 1-60 MINUTES  Fluoroscopy was utilized by the requesting physician.  No radiographic  interpretation.     EKG: Orders placed or performed in visit on 07/31/14  . EKG 12-Lead     Hospital Course: Patient was admitted to [facility] and taken to the OR and underwent the above state procedure without complications.  Patient tolerated the procedure well and was later transferred to the recovery room and then to the orthopaedic floor for postoperative care.  They were given PO and IV analgesics for pain control following their surgery.  They were given 24 hours of postoperative antibiotics of  Anti-infectives    Start     Dose/Rate Route Frequency Ordered Stop   10/12/14 0600  ceFAZolin (ANCEF) 3 g in dextrose 5 % 50 mL IVPB  Status:   Discontinued     3 g 160 mL/hr over 30 Minutes Intravenous On call to O.R. 10/11/14 1252 10/11/14 1300   10/11/14 2000  ceFAZolin (ANCEF) IVPB 2 g/50 mL premix     2 g 100 mL/hr over 30 Minutes Intravenous Every 6 hours 10/11/14 1748 10/12/14 0245   10/11/14 1315  ceFAZolin (ANCEF) 3 g in dextrose 5 % 50 mL IVPB     3 g 160 mL/hr over 30 Minutes Intravenous On call to O.R. 10/11/14 1300 10/11/14 1435     and started on DVT prophylaxis in the form of Eliquis.   PT and OT were ordered for total hip protocol.  The patient was allowed to be WBAT with therapy. Discharge planning was consulted to help with postop disposition and equipment needs.  Patient had a decent night on the evening of surgery.  They started to get up OOB with therapy on day one.  Hemovac drain was pulled without difficulty.  Continued to work with therapy into day two.  Dressing was changed on day two and the incision was healing well. Patient was seen in rounds and was ready to go home.  Discharge home with home health Diet - Cardiac diet Follow up - in 2 weeks Activity - WBAT Disposition - Home Condition Upon Discharge - Good D/C Meds - See DC Summary DVT Prophylaxis - Eliquis  Discharge Instructions    Call MD / Call 911    Complete by:  As directed   If you experience chest pain or shortness of breath, CALL 911 and be transported to the hospital emergency room.  If you develope a fever above 101 F, pus (white drainage) or increased drainage or redness at the wound, or calf pain, call your  surgeon's office.     Change dressing    Complete by:  As directed   You may change your dressing dressing daily with sterile 4 x 4 inch gauze dressing and paper tape.  Do not submerge the incision under water.     Constipation Prevention    Complete by:  As directed   Drink plenty of fluids.  Prune juice may be helpful.  You may use a stool softener, such as Colace (over the counter) 100 mg twice a day.  Use MiraLax (over the  counter) for constipation as needed.     Diet - low sodium heart healthy    Complete by:  As directed      Discharge instructions    Complete by:  As directed   Pick up stool softner and laxative for home use following surgery while on pain medications. Do not submerge incision under water. Please use good hand washing techniques while changing dressing each day. May shower starting three days after surgery. Please use a clean towel to pat the incision dry following showers. Continue to use ice for pain and swelling after surgery. Do not use any lotions or creams on the incision until instructed by your surgeon.  Total Hip Protocol.  Resume Eliquis twice a day at  Home.  Postoperative Constipation Protocol  Constipation - defined medically as fewer than three stools per week and severe constipation as less than one stool per week.  One of the most common issues patients have following surgery is constipation.  Even if you have a regular bowel pattern at home, your normal regimen is likely to be disrupted due to multiple reasons following surgery.  Combination of anesthesia, postoperative narcotics, change in appetite and fluid intake all can affect your bowels.  In order to avoid complications following surgery, here are some recommendations in order to help you during your recovery period.  Colace (docusate) - Pick up an over-the-counter form of Colace or another stool softener and take twice a day as long as you are requiring postoperative pain medications.  Take with a full glass of water daily.  If you experience loose stools or diarrhea, hold the colace until you stool forms back up.  If your symptoms do not get better within 1 week or if they get worse, check with your doctor.  Dulcolax (bisacodyl) - Pick up over-the-counter and take as directed by the product packaging as needed to assist with the movement of your bowels.  Take with a full glass of water.  Use this product as needed  if not relieved by Colace only.   MiraLax (polyethylene glycol) - Pick up over-the-counter to have on hand.  MiraLax is a solution that will increase the amount of water in your bowels to assist with bowel movements.  Take as directed and can mix with a glass of water, juice, soda, coffee, or tea.  Take if you go more than two days without a movement. Do not use MiraLax more than once per day. Call your doctor if you are still constipated or irregular after using this medication for 7 days in a row.  If you continue to have problems with postoperative constipation, please contact the office for further assistance and recommendations.  If you experience "the worst abdominal pain ever" or develop nausea or vomiting, please contact the office immediatly for further recommendations for treatment.     Do not sit on low chairs, stoools or toilet seats, as it may be difficult to  get up from low surfaces    Complete by:  As directed      Driving restrictions    Complete by:  As directed   No driving until released by the physician.     Increase activity slowly as tolerated    Complete by:  As directed      Lifting restrictions    Complete by:  As directed   No lifting until released by the physician.     Patient may shower    Complete by:  As directed   You may shower without a dressing once there is no drainage.  Do not wash over the wound.  If drainage remains, do not shower until drainage stops.     TED hose    Complete by:  As directed   Use stockings (TED hose) for 3 weeks on both leg(s).  You may remove them at night for sleeping.     Weight bearing as tolerated    Complete by:  As directed   Laterality:  left  Extremity:  Lower            Medication List    STOP taking these medications        FISH OIL PO     HYDROcodone-acetaminophen 5-325 MG per tablet  Commonly known as:  NORCO/VICODIN     multivitamin tablet      TAKE these medications        acetaminophen 650 MG CR  tablet  Commonly known as:  TYLENOL  Take 1,300 mg by mouth every 8 (eight) hours as needed for pain.     amLODipine 5 MG tablet  Commonly known as:  NORVASC  Take 5 mg by mouth daily.     amoxicillin 500 MG capsule  Commonly known as:  AMOXIL  Take 2,000 mg by mouth once. 1 hour prior to dental appointments     atorvastatin 10 MG tablet  Commonly known as:  LIPITOR  Take 10 mg by mouth daily.     ELIQUIS 5 MG Tabs tablet  Generic drug:  apixaban  Take 1 tablet by mouth two  times daily     fenofibrate 160 MG tablet  Take 160 mg by mouth daily.     ferrous sulfate 325 (65 FE) MG EC tablet  Take 325 mg by mouth every evening.     ketoconazole 2 % cream  Commonly known as:  NIZORAL  Apply 1 application topically as needed for irritation (rash).     losartan 100 MG tablet  Commonly known as:  COZAAR  Take 100 mg by mouth daily.     methocarbamol 500 MG tablet  Commonly known as:  ROBAXIN  Take 1 tablet (500 mg total) by mouth every 6 (six) hours as needed for muscle spasms.     oxyCODONE 5 MG immediate release tablet  Commonly known as:  Oxy IR/ROXICODONE  Take 1-2 tablets (5-10 mg total) by mouth every 3 (three) hours as needed for moderate pain, severe pain or breakthrough pain.     propranolol 60 MG tablet  Commonly known as:  INDERAL  Take 2 tablets in the morning, 1 in the afternoon, and 1 in the evening.     traMADol 50 MG tablet  Commonly known as:  ULTRAM  Take 1-2 tablets (50-100 mg total) by mouth every 6 (six) hours as needed (mild pain).           Follow-up Information    Follow up with [facility].   Why:  home health physical therapy   Contact information:   [address] [address] [phone]       Follow up with Gearlean Alf, MD. Schedule an appointment as soon as possible for a visit in 2 weeks.   Specialty:  Orthopedic Surgery   Why:  Call office at [phone] to setup followup appointment in two weeks  with one of the PAs the week of July 25th.   Contact information:   [address] [place] [address] [phone]       Signed: Arlee Muslim, PA-C Orthopaedic Surgery 10/12/2014, 10:20 PM

## 2014-10-12 NOTE — Discharge Instructions (Signed)
° °Dr. Frank Aluisio °Total Joint Specialist °[facility] °[address] °[place], [place] [address] °[phone] ° °ANTERIOR APPROACH TOTAL HIP REPLACEMENT POSTOPERATIVE DIRECTIONS ° ° °Hip Rehabilitation, Guidelines Following Surgery  °The results of a hip operation are greatly improved after range of motion and muscle strengthening exercises. Follow all safety measures which are given to protect your hip. If any of these exercises cause increased pain or swelling in your joint, decrease the amount until you are comfortable again. Then slowly increase the exercises. Call your caregiver if you have problems or questions.  ° °HOME CARE INSTRUCTIONS  °Remove items at home which could result in a fall. This includes throw rugs or furniture in walking pathways.  °· ICE to the affected hip every three hours for 30 minutes at a time and then as needed for pain and swelling.  Continue to use ice on the hip for pain and swelling from surgery. You may notice swelling that will progress down to the foot and ankle.  This is normal after surgery.  Elevate the leg when you are not up walking on it.   °· Continue to use the breathing machine which will help keep your temperature down.  It is common for your temperature to cycle up and down following surgery, especially at night when you are not up moving around and exerting yourself.  The breathing machine keeps your lungs expanded and your temperature down. ° ° °DIET °You may resume your previous home diet once your are discharged from the hospital. ° °DRESSING / WOUND CARE / SHOWERING °You may shower 3 days after surgery, but keep the wounds dry during showering.  You may use an occlusive plastic wrap (Press'n Seal for example), NO SOAKING/SUBMERGING IN THE BATHTUB.  If the bandage gets wet, change with a clean dry gauze.  If the incision gets wet, pat the wound dry with a clean towel. °You may start showering once you are discharged home but do not  submerge the incision under water. Just pat the incision dry and apply a dry gauze dressing on daily. °Change the surgical dressing daily and reapply a dry dressing each time. ° °ACTIVITY °Walk with your walker as instructed. °Use walker as long as suggested by your caregivers. °Avoid periods of inactivity such as sitting longer than an hour when not asleep. This helps prevent blood clots.  °You may resume a sexual relationship in one month or when given the OK by your doctor.  °You may return to work once you are cleared by your doctor.  °Do not drive a car for 6 weeks or until released by you surgeon.  °Do not drive while taking narcotics. ° °WEIGHT BEARING °Weight bearing as tolerated with assist device (walker, cane, etc) as directed, use it as long as suggested by your surgeon or therapist, typically at least 4-6 weeks. ° °POSTOPERATIVE CONSTIPATION PROTOCOL °Constipation - defined medically as fewer than three stools per week and severe constipation as less than one stool per week. ° °One of the most common issues patients have following surgery is constipation.  Even if you have a regular bowel pattern at home, your normal regimen is likely to be disrupted due to multiple reasons following surgery.  Combination of anesthesia, postoperative narcotics, change in appetite and fluid intake all can affect your bowels.  In order to avoid complications following surgery, here are some recommendations in order to help you during your recovery period. ° °Colace (docusate) - Pick up an over-the-counter   form of Colace or another stool softener and take twice a day as long as you are requiring postoperative pain medications.  Take with a full glass of water daily.  If you experience loose stools or diarrhea, hold the colace until you stool forms back up.  If your symptoms do not get better within 1 week or if they get worse, check with your doctor. ° °Dulcolax (bisacodyl) - Pick up over-the-counter and take as directed  by the product packaging as needed to assist with the movement of your bowels.  Take with a full glass of water.  Use this product as needed if not relieved by Colace only.  ° °MiraLax (polyethylene glycol) - Pick up over-the-counter to have on hand.  MiraLax is a solution that will increase the amount of water in your bowels to assist with bowel movements.  Take as directed and can mix with a glass of water, juice, soda, coffee, or tea.  Take if you go more than two days without a movement. °Do not use MiraLax more than once per day. Call your doctor if you are still constipated or irregular after using this medication for 7 days in a row. ° °If you continue to have problems with postoperative constipation, please contact the office for further assistance and recommendations.  If you experience "the worst abdominal pain ever" or develop nausea or vomiting, please contact the office immediatly for further recommendations for treatment. ° °ITCHING ° If you experience itching with your medications, try taking only a single pain pill, or even half a pain pill at a time.  You can also use Benadryl over the counter for itching or also to help with sleep.  ° °TED HOSE STOCKINGS °Wear the elastic stockings on both legs for three weeks following surgery during the day but you may remove then at night for sleeping. ° °MEDICATIONS °See your medication summary on the “After Visit Summary” that the nursing staff will review with you prior to discharge.  You may have some home medications which will be placed on hold until you complete the course of blood thinner medication.  It is important for you to complete the blood thinner medication as prescribed by your surgeon.  Continue your approved medications as instructed at time of discharge. ° °PRECAUTIONS °If you experience chest pain or shortness of breath - call 911 immediately for transfer to the hospital emergency department.  °If you develop a fever greater that 101 F,  purulent drainage from wound, increased redness or drainage from wound, foul odor from the wound/dressing, or calf pain - CONTACT YOUR SURGEON.   °                                                °FOLLOW-UP APPOINTMENTS °Make sure you keep all of your appointments after your operation with your surgeon and caregivers. You should call the office at the above phone number and make an appointment for approximately two weeks after the date of your surgery or on the date instructed by your surgeon outlined in the "After Visit Summary". ° °RANGE OF MOTION AND STRENGTHENING EXERCISES  °These exercises are designed to help you keep full movement of your hip joint. Follow your caregiver's or physical therapist's instructions. Perform all exercises about fifteen times, three times per day or as directed. Exercise both hips, even if you   have had only one joint replacement. These exercises can be done on a training (exercise) mat, on the floor, on a table or on a bed. Use whatever works the best and is most comfortable for you. Use music or television while you are exercising so that the exercises are a pleasant break in your day. This will make your life better with the exercises acting as a break in routine you can look forward to.  Lying on your back, slowly slide your foot toward your buttocks, raising your knee up off the floor. Then slowly slide your foot back down until your leg is straight again.  Lying on your back spread your legs as far apart as you can without causing discomfort.  Lying on your side, raise your upper leg and foot straight up from the floor as far as is comfortable. Slowly lower the leg and repeat.  Lying on your back, tighten up the muscle in the front of your thigh (quadriceps muscles). You can do this by keeping your leg straight and trying to raise your heel off the floor. This helps strengthen the largest muscle supporting your knee.  Lying on your back, tighten up the muscles of your  buttocks both with the legs straight and with the knee bent at a comfortable angle while keeping your heel on the floor.   IF YOU ARE TRANSFERRED TO A SKILLED REHAB FACILITY If the patient is transferred to a skilled rehab facility following release from the hospital, a list of the current medications will be sent to the facility for the patient to continue.  When discharged from the skilled rehab facility, please have the facility set up the patient's [place] prior to being released. Also, the skilled facility will be responsible for providing the patient with their medications at time of release from the facility to include their pain medication, the muscle relaxants, and their blood thinner medication. If the patient is still at the rehab facility at time of the two week follow up appointment, the skilled rehab facility will also need to assist the patient in arranging follow up appointment in our office and any transportation needs.  MAKE SURE YOU:  Understand these instructions.  Get help right away if you are not doing well or get worse.    Pick up stool softner and laxative for home use following surgery while on pain medications. Do not submerge incision under water. Please use good hand washing techniques while changing dressing each day. May shower starting three days after surgery. Please use a clean towel to pat the incision dry following showers. Continue to use ice for pain and swelling after surgery. Do not use any lotions or creams on the incision until instructed by your surgeon.  Resume Eliquis twice a day at home.

## 2014-10-12 NOTE — Care Management Note (Signed)
Case Management Note  Patient Details  Name: [name] MRN: [id number] Date of Birth: [birth year]  Subjective/Objective:                  LEFT TOTAL HIP ARTHROPLASTY ANTERIOR APPROACH (Left)  Action/Plan: Discharge planning  Expected Discharge Date:                  Expected Discharge Plan:  [place]  In-House Referral:     Discharge planning Services  CM Consult  Post Acute Care Choice:  Home Health Choice offered to:  Patient  DME Arranged:    DME Agency:     HH Arranged:  PT HH Agency:  [facility]  Status of Service:  Completed, signed off  Medicare Important Message Given:    Date Medicare IM Given:    Medicare IM give by:    Date Additional Medicare IM Given:    Additional Medicare Important Message give by:     If discussed at [place] of Stay Meetings, dates discussed:    Additional Comments: CM met with pt in room to offer choice of home health agency.  Pt chooses [facility] to render HHPT.  Address and contact information verified by pt.  Referral accepted by [facility] rep, Tim.  No DME is needed.  No other CM needs were communicated. Dellie Catholic, RN 10/12/2014, 12:36 PM

## 2014-10-13 LAB — CBC
HEMATOCRIT: 32.8 % — AB (ref 36.0–46.0)
HEMOGLOBIN: 10.5 g/dL — AB (ref 12.0–15.0)
MCH: 28.7 pg (ref 26.0–34.0)
MCHC: 32 g/dL (ref 30.0–36.0)
MCV: 89.6 fL (ref 78.0–100.0)
Platelets: 254 10*3/uL (ref 150–400)
RBC: 3.66 MIL/uL — ABNORMAL LOW (ref 3.87–5.11)
RDW: 14.9 % (ref 11.5–15.5)
WBC: 6.8 10*3/uL (ref 4.0–10.5)

## 2014-10-13 LAB — BASIC METABOLIC PANEL
Anion gap: 7 (ref 5–15)
BUN: 22 mg/dL — ABNORMAL HIGH (ref 6–20)
CALCIUM: 9.6 mg/dL (ref 8.9–10.3)
CO2: 28 mmol/L (ref 22–32)
Chloride: 102 mmol/L (ref 101–111)
Creatinine, Ser: 0.6 mg/dL (ref 0.44–1.00)
GFR calc Af Amer: >60 mL/min (ref >60)
Glucose, Bld: 113 mg/dL — ABNORMAL HIGH (ref 65–99)
Potassium: 4.4 mmol/L (ref 3.5–5.1)
Sodium: 137 mmol/L (ref 135–145)

## 2014-10-13 NOTE — Progress Notes (Signed)
Pt to d/c home with [facility]. No DME needs. AVS reviewed and "My Chart" discussed with pt. Pt capable of verbalizing medications, dressing changes, signs and symptoms of infection, and follow-up appointments. Remains hemodynamically stable. No signs and symptoms of distress. Educated pt to return to ER in the case of SOB, dizziness, or chest pain.

## 2014-10-13 NOTE — Progress Notes (Signed)
Physical Therapy Treatment Patient Details Name: Kristin Hood MRN: [id number] DOB: [birth year] Today's Date: 10/13/2014    History of Present Illness Pt is s/p L direct anteior THA; history of bilateral TKA    PT Comments    Pt progressing well with mobility and eager for return home.  Reviewed step into home and car transfers with pt.  Follow Up Recommendations  Home health PT     Equipment Recommendations  None recommended by PT    Recommendations for Other Services OT consult     Precautions / Restrictions Precautions Precautions: Fall Restrictions Weight Bearing Restrictions: No Other Position/Activity Restrictions: WBAT    Mobility  Bed Mobility Overal bed mobility: Needs Assistance Bed Mobility: Supine to Sit;Sit to Supine     Supine to sit: Min guard Sit to supine: Min assist   General bed mobility comments: cues for sequence and use of R LE to self assist.  Physical assist to manage L LE and to bring trunk to upright  Transfers Overall transfer level: Needs assistance Equipment used: Rolling walker (2 wheeled) Transfers: Sit to/from Stand Sit to Stand: Min guard         General transfer comment: cues for hand placement and LE management.   Ambulation/Gait Ambulation/Gait assistance: Supervision Ambulation Distance (Feet): 120 Feet Assistive device: Rolling walker (2 wheeled) Gait Pattern/deviations: Step-to pattern;Decreased step length - right;Decreased step length - left;Shuffle;Trunk flexed Gait velocity: decr   General Gait Details: cues for sequence, posture and position from RW   Stairs Stairs: Yes Stairs assistance: Min assist Stair Management: No rails;Step to pattern;Forwards;With walker Number of Stairs: 1 General stair comments: min cues for sequence and foot/RW placement  Wheelchair Mobility    Modified Rankin (Stroke Patients Only)       Balance                                    Cognition  Arousal/Alertness: Awake/alert Behavior During Therapy: WFL for tasks assessed/performed Overall Cognitive Status: Within Functional Limits for tasks assessed                      Exercises Total Joint Exercises Ankle Circles/Pumps: AROM;Both;15 reps;Supine Quad Sets: AROM;Both;10 reps;Supine Gluteal Sets: AROM;Both;10 reps;Supine Heel Slides: AAROM;Left;20 reps;Supine Hip ABduction/ADduction: AAROM;Left;Supine;15 reps    General Comments        Pertinent Vitals/Pain Pain Assessment: 0-10 Pain Score: 5  Pain Location: L hip Pain Descriptors / Indicators: Aching;Sore Pain Intervention(s): Limited activity within patient's tolerance;Monitored during session;Premedicated before session;Ice applied    Home Living                      Prior Function            PT Goals (current goals can now be found in the care plan section) Acute Rehab PT Goals Patient Stated Goal: return to independence.  PT Goal Formulation: With patient Time For Goal Achievement: 10/14/14 Potential to Achieve Goals: Good Progress towards PT goals: Progressing toward goals    Frequency  7X/week    PT Plan Current plan remains appropriate    Co-evaluation             End of Session Equipment Utilized During Treatment: Gait belt Activity Tolerance: Patient tolerated treatment well Patient left: in chair;with call bell/phone within reach     Time: 0828-0906 PT Time Calculation (min) (ACUTE ONLY): 38  min  Charges:  $Gait Training: 8-22 mins $Therapeutic Exercise: 8-22 mins $Therapeutic Activity: 8-22 mins                    G Codes:      Rossana Molchan 2014-11-11, 9:16 AM

## 2014-10-13 NOTE — Progress Notes (Signed)
Occupational Therapy Treatment Patient Details Name: Kristin Hood MRN: [id number] DOB: 1941-02-12 Today's Date: 10/13/2014    History of present illness Pt is s/p L direct anteior THA; history of bilateral TKA   OT comments  Pt for d/c today. Reviewed safety with ADL including shower chair use and placement of a shower chair for increased safety. Husband present. Pt states she didn't feel she needed to get up and practice any ADL further but did have several questions about shower safety and potential grab bars, TED hose and progressing activity. All questions answered with pt and spouse. Discussed at length shower safety and demonstrated correct shower transfer technique for both.    Follow Up Recommendations  No OT follow up;Supervision/Assistance - 24 hour    Equipment Recommendations  None recommended by OT    Recommendations for Other Services      Precautions / Restrictions Precautions Precautions: Fall Restrictions Other Position/Activity Restrictions: WBAT       Mobility Bed Mobility                  Transfers                      Balance                                   ADL                                         General ADL Comments: Pt states she feels comfortable with how to instruct spouse to assist with LB self care and plans to wear mostly house dresses. reviewed sequence for donning underwear over L LE first and then R and have walker in front of her when she stands to pull up clothing. Pt with questions regarding shower setup and possible grab bars. Explained option of placing a shower chair just inside of shower facing opening of shower and then stepping backwards over ledge and being able to sit down without having to bring walker into shower to turn around. Explaned this way, she may not need to consider grab bar placement because she can sit down as soon as she steps back over ledge. Pt states she plans  to obtain a shower seat on her own and agrees this placement of shower chair will be helpful. Reviewed seqence for stepping over ledge but pt declines need to actually practice. She declines need to get up to 3in1 to use commode or practice.       Vision                     Perception     Praxis      Cognition   Behavior During Therapy: WFL for tasks assessed/performed Overall Cognitive Status: Within Functional Limits for tasks assessed                       Extremity/Trunk Assessment               Exercises     Shoulder Instructions       General Comments      Pertinent Vitals/ Pain       Pain Assessment: 0-10 Pain Score: 4  Pain Location: L hip Pain Descriptors / Indicators: Sore Pain Intervention(s):  Monitored during session  Home Living                                          Prior Functioning/Environment              Frequency Min 2X/week     Progress Toward Goals  OT Goals(current goals can now be found in the care plan section)  Progress towards OT goals: Progressing toward goals     Plan Discharge plan remains appropriate    Co-evaluation                 End of Session     Activity Tolerance     Patient Left in chair;with call bell/phone within reach;with family/visitor present   Nurse Communication          Time: 0051-1021 OT Time Calculation (min): 15 min  Charges: OT General Charges $OT Visit: 1 Procedure OT Treatments $Therapeutic Activity: 8-22 mins  Jules Schick  [phone] 10/13/2014, 12:46 PM

## 2014-10-13 NOTE — Progress Notes (Signed)
   Subjective: 2 Days Post-Op Procedure(s) (LRB): LEFT TOTAL HIP ARTHROPLASTY ANTERIOR APPROACH (Left) Patient reports pain as mild and moderate.   Patient seen in rounds with Dr. Wynelle Link. Patient is well, but has had some minor complaints of pain in the hip, requiring pain medications. The deep preop pain is gone. Patient is ready to go home later today  Objective: Vital signs in last 24 hours: Temp:  [98.1 F (36.7 C)-98.6 F (37 C)] 98.3 F (36.8 C) (07/15 0407) Pulse Rate:  [63-104] 63 (07/15 0407) Resp:  [16] 16 (07/15 0407) BP: (110-125)/(45-65) 125/45 mmHg (07/15 0407) SpO2:  [88 %-99 %] 99 % (07/15 0407)  Intake/Output from previous day:  Intake/Output Summary (Last 24 hours) at 10/13/14 0732 Last data filed at 10/13/14 0407  Gross per 24 hour  Intake   1320 ml  Output    475 ml  Net    845 ml     Labs:  Recent Labs  10/12/14 0435 10/13/14 0415  HGB 10.0* 10.5*    Recent Labs  10/12/14 0435 10/13/14 0415  WBC 5.1 6.8  RBC 3.56* 3.66*  HCT 31.2* 32.8*  PLT 209 254    Recent Labs  10/12/14 0435 10/13/14 0415  NA 136 137  K 4.7 4.4  CL 104 102  CO2 25 28  BUN 22* 22*  CREATININE 0.82 0.60  GLUCOSE 172* 113*  CALCIUM 9.0 9.6    Recent Labs  10/11/14 1330  INR 1.09    EXAM: General - Patient is Alert, Appropriate and Oriented Extremity - Neurovascular intact Sensation intact distally Dorsiflexion/Plantar flexion intact Incision - clean, dry, no drainage Motor Function - intact, moving foot and toes well on exam.   Assessment/Plan: 2 Days Post-Op Procedure(s) (LRB): LEFT TOTAL HIP ARTHROPLASTY ANTERIOR APPROACH (Left) Procedure(s) (LRB): LEFT TOTAL HIP ARTHROPLASTY ANTERIOR APPROACH (Left) Past Medical History  Diagnosis Date  . Hypertension     , With mild concentric left ventricular hypertrophy  . Shingles   . Atrial fibrillation 07/2012    Eliquis, rate controlled  . Hx of adenomatous colonic polyps 2006    , Benign  .  Mixed dyslipidemia   . Coarse tremors     , Essential  . Hypercholesteremia   . Breast cancer 2001    left mastectomy  . Complication of anesthesia     SMALLER TUBE FOR INTUBATION AS GAGS ON TUBE  . Dysrhythmia 08/20/2012    Atrial Fibrillation  . 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 CVA (cerebral vascular accident) 2013  . Valvular sclerosis 09/23/2003    without stenosis  . History of chemotherapy 11/13/1998 to 2010    Adriamycin/Docotaxol, Dexorubicin/Taxotere, Femara, Tamoxifen   Principal Problem:   OA (osteoarthritis) of hip  Estimated body mass index is 42.95 kg/(m^2) as calculated from the following:   Height as of this encounter: 5\' 6"  (1.676 m).   Weight as of this encounter: 120.657 kg (266 lb). Up with therapy Discharge home with home health Diet - Cardiac diet Follow up - in 2 weeks Activity - WBAT Disposition - Home Condition Upon Discharge - Good D/C Meds - See DC Summary DVT Prophylaxis - Eliquis  Arlee Muslim, PA-C Orthopaedic Surgery 10/13/2014, 7:32 AM

## 2014-11-16 ENCOUNTER — Observation Stay (HOSPITAL_COMMUNITY)
Admission: EM | Admit: 2014-11-16 | Discharge: 2014-11-17 | Disposition: A | Discharge status: Home / Self Care | Payer: Medicare Other | Attending: Internal Medicine | Admitting: Internal Medicine

## 2014-11-16 ENCOUNTER — Encounter (HOSPITAL_COMMUNITY): Payer: Self-pay | Admitting: Emergency Medicine

## 2014-11-16 ENCOUNTER — Emergency Department (HOSPITAL_COMMUNITY): Payer: Medicare Other

## 2014-11-16 DIAGNOSIS — J40 Bronchitis, not specified as acute or chronic: Secondary | ICD-10-CM | POA: Insufficient documentation

## 2014-11-16 DIAGNOSIS — Z87891 Personal history of nicotine dependence: Secondary | ICD-10-CM | POA: Diagnosis not present

## 2014-11-16 DIAGNOSIS — M1990 Unspecified osteoarthritis, unspecified site: Secondary | ICD-10-CM | POA: Diagnosis not present

## 2014-11-16 DIAGNOSIS — Z853 Personal history of malignant neoplasm of breast: Secondary | ICD-10-CM | POA: Diagnosis not present

## 2014-11-16 DIAGNOSIS — I482 Chronic atrial fibrillation, unspecified: Secondary | ICD-10-CM

## 2014-11-16 DIAGNOSIS — Z6841 Body Mass Index (BMI) 40.0 and over, adult: Secondary | ICD-10-CM | POA: Insufficient documentation

## 2014-11-16 DIAGNOSIS — Z8673 Personal history of transient ischemic attack (TIA), and cerebral infarction without residual deficits: Secondary | ICD-10-CM | POA: Insufficient documentation

## 2014-11-16 DIAGNOSIS — G250 Essential tremor: Secondary | ICD-10-CM | POA: Insufficient documentation

## 2014-11-16 DIAGNOSIS — R590 Localized enlarged lymph nodes: Secondary | ICD-10-CM | POA: Diagnosis not present

## 2014-11-16 DIAGNOSIS — E785 Hyperlipidemia, unspecified: Secondary | ICD-10-CM | POA: Diagnosis not present

## 2014-11-16 DIAGNOSIS — R0602 Shortness of breath: Secondary | ICD-10-CM

## 2014-11-16 DIAGNOSIS — Z66 Do not resuscitate: Secondary | ICD-10-CM | POA: Insufficient documentation

## 2014-11-16 DIAGNOSIS — E041 Nontoxic single thyroid nodule: Secondary | ICD-10-CM | POA: Diagnosis not present

## 2014-11-16 DIAGNOSIS — E780 Pure hypercholesterolemia: Secondary | ICD-10-CM | POA: Insufficient documentation

## 2014-11-16 DIAGNOSIS — I517 Cardiomegaly: Secondary | ICD-10-CM | POA: Insufficient documentation

## 2014-11-16 DIAGNOSIS — R079 Chest pain, unspecified: Principal | ICD-10-CM

## 2014-11-16 DIAGNOSIS — I10 Essential (primary) hypertension: Secondary | ICD-10-CM | POA: Diagnosis not present

## 2014-11-16 HISTORY — DX: Chronic atrial fibrillation, unspecified: I48.20

## 2014-11-16 HISTORY — DX: Cardiac murmur, unspecified: R01.1

## 2014-11-16 HISTORY — DX: Essential tremor: G25.0

## 2014-11-16 HISTORY — DX: Nontoxic single thyroid nodule: E04.1

## 2014-11-16 HISTORY — DX: Shortness of breath: R06.02

## 2014-11-16 LAB — CBC
HCT: 36.5 % (ref 36.0–46.0)
HEMATOCRIT: 34.7 % — AB (ref 36.0–46.0)
HEMOGLOBIN: 11.6 g/dL — AB (ref 12.0–15.0)
HEMOGLOBIN: 11.2 g/dL — AB (ref 12.0–15.0)
MCH: 28 pg (ref 26.0–34.0)
MCH: 28.4 pg (ref 26.0–34.0)
MCHC: 31.8 g/dL (ref 30.0–36.0)
MCHC: 32.3 g/dL (ref 30.0–36.0)
MCV: 88.2 fL (ref 78.0–100.0)
MCV: 88.1 fL (ref 78.0–100.0)
PLATELETS: 312 10*3/uL (ref 150–400)
Platelets: 245 10*3/uL (ref 150–400)
RBC: 4.14 MIL/uL (ref 3.87–5.11)
RBC: 3.94 MIL/uL (ref 3.87–5.11)
RDW: 15.7 % — AB (ref 11.5–15.5)
RDW: 15.5 % (ref 11.5–15.5)
WBC: 4.2 10*3/uL (ref 4.0–10.5)
WBC: 4.2 10*3/uL (ref 4.0–10.5)

## 2014-11-16 LAB — I-STAT TROPONIN, ED: TROPONIN I, POC: 0.01 ng/mL (ref 0.00–0.08)

## 2014-11-16 LAB — BASIC METABOLIC PANEL
ANION GAP: 10 (ref 5–15)
BUN: 14 mg/dL (ref 6–20)
CALCIUM: 10.1 mg/dL (ref 8.9–10.3)
CO2: 25 mmol/L (ref 22–32)
CREATININE: 0.66 mg/dL (ref 0.44–1.00)
Chloride: 103 mmol/L (ref 101–111)
GFR calc Af Amer: >60 mL/min (ref >60)
GLUCOSE: 117 mg/dL — AB (ref 65–99)
Potassium: 4.3 mmol/L (ref 3.5–5.1)
Sodium: 138 mmol/L (ref 135–145)

## 2014-11-16 LAB — TROPONIN I
Troponin I: <0.03 ng/mL (ref <0.031)
Troponin I: <0.03 ng/mL (ref <0.031)
Troponin I: <0.03 ng/mL (ref <0.031)

## 2014-11-16 LAB — CREATININE, SERUM
Creatinine, Ser: 0.66 mg/dL (ref 0.44–1.00)
GFR calc Af Amer: >60 mL/min (ref >60)
GFR calc non Af Amer: >60 mL/min (ref >60)

## 2014-11-16 LAB — BRAIN NATRIURETIC PEPTIDE: B Natriuretic Peptide: 298.4 pg/mL — ABNORMAL HIGH (ref 0.0–100.0)

## 2014-11-16 MED ORDER — LOSARTAN POTASSIUM 50 MG PO TABS
100.0000 mg | ORAL_TABLET | Freq: Every day | ORAL | Status: DC
  Administered 2014-11-16 – 2014-11-17 (×2): 100 mg via ORAL
  Filled 2014-11-16 (×2): qty 2

## 2014-11-16 MED ORDER — ADULT MULTIVITAMIN W/MINERALS CH
1.0000 | ORAL_TABLET | Freq: Every day | ORAL | Status: DC
  Administered 2014-11-16 – 2014-11-17 (×2): 1 via ORAL
  Filled 2014-11-16 (×2): qty 1

## 2014-11-16 MED ORDER — FERROUS SULFATE 325 (65 FE) MG PO TABS
325.0000 mg | ORAL_TABLET | Freq: Every evening | ORAL | Status: DC
  Administered 2014-11-16 – 2014-11-17 (×2): 325 mg via ORAL
  Filled 2014-11-16 (×2): qty 1

## 2014-11-16 MED ORDER — ASPIRIN 81 MG PO CHEW
324.0000 mg | CHEWABLE_TABLET | Freq: Once | ORAL | Status: AC
  Administered 2014-11-16: 324 mg via ORAL
  Filled 2014-11-16: qty 4

## 2014-11-16 MED ORDER — PROPRANOLOL HCL 60 MG PO TABS
120.0000 mg | ORAL_TABLET | Freq: Every day | ORAL | Status: DC
Start: 2014-11-16 — End: 2014-11-17
  Administered 2014-11-16 – 2014-11-17 (×2): 120 mg via ORAL
  Filled 2014-11-16 (×2): qty 2

## 2014-11-16 MED ORDER — HYDROCODONE-ACETAMINOPHEN 5-325 MG PO TABS
1.0000 | ORAL_TABLET | Freq: Four times a day (QID) | ORAL | Status: DC | PRN
  Administered 2014-11-16: 2 via ORAL
  Filled 2014-11-16: qty 2

## 2014-11-16 MED ORDER — ACETAMINOPHEN 325 MG PO TABS: 650.0000 mg | ORAL_TABLET | ORAL | Status: DC | PRN

## 2014-11-16 MED ORDER — IOHEXOL 350 MG/ML SOLN
100.0000 mL | Freq: Once | INTRAVENOUS | Status: AC | PRN
  Administered 2014-11-16: 80 mL via INTRAVENOUS

## 2014-11-16 MED ORDER — AMLODIPINE BESYLATE 5 MG PO TABS
5.0000 mg | ORAL_TABLET | Freq: Every day | ORAL | Status: DC
  Administered 2014-11-16 – 2014-11-17 (×2): 5 mg via ORAL
  Filled 2014-11-16 (×2): qty 1

## 2014-11-16 MED ORDER — PROPRANOLOL HCL 60 MG PO TABS
60.0000 mg | ORAL_TABLET | Freq: Every day | ORAL | Status: DC
  Administered 2014-11-16 – 2014-11-17 (×2): 60 mg via ORAL
  Filled 2014-11-16 (×2): qty 1

## 2014-11-16 MED ORDER — OMEGA-3-ACID ETHYL ESTERS 1 G PO CAPS
1.0000 g | ORAL_CAPSULE | Freq: Two times a day (BID) | ORAL | Status: DC
  Administered 2014-11-16 – 2014-11-17 (×3): 1 g via ORAL
  Filled 2014-11-16 (×4): qty 1

## 2014-11-16 MED ORDER — PROPRANOLOL HCL 60 MG PO TABS
60.0000 mg | ORAL_TABLET | Freq: Every day | ORAL | Status: DC
  Administered 2014-11-16: 60 mg via ORAL
  Filled 2014-11-16 (×2): qty 1

## 2014-11-16 MED ORDER — ATORVASTATIN CALCIUM 10 MG PO TABS
10.0000 mg | ORAL_TABLET | Freq: Every day | ORAL | Status: DC
  Administered 2014-11-16 – 2014-11-17 (×2): 10 mg via ORAL
  Filled 2014-11-16 (×3): qty 1

## 2014-11-16 MED ORDER — ENOXAPARIN SODIUM 40 MG/0.4ML ~~LOC~~ SOLN: 40.0000 mg | SUBCUTANEOUS | Status: DC

## 2014-11-16 MED ORDER — ALBUTEROL SULFATE (2.5 MG/3ML) 0.083% IN NEBU: 2.5000 mg | INHALATION_SOLUTION | RESPIRATORY_TRACT | Status: DC | PRN

## 2014-11-16 MED ORDER — NITROGLYCERIN 0.4 MG SL SUBL: 0.4000 mg | SUBLINGUAL_TABLET | SUBLINGUAL | Status: DC | PRN

## 2014-11-16 MED ORDER — IPRATROPIUM-ALBUTEROL 0.5-2.5 (3) MG/3ML IN SOLN
3.0000 mL | Freq: Three times a day (TID) | RESPIRATORY_TRACT | Status: DC
  Administered 2014-11-16 – 2014-11-17 (×4): 3 mL via RESPIRATORY_TRACT
  Filled 2014-11-16 (×4): qty 3

## 2014-11-16 MED ORDER — ONDANSETRON HCL 4 MG/2ML IJ SOLN: 4.0000 mg | Freq: Four times a day (QID) | INTRAMUSCULAR | Status: DC | PRN

## 2014-11-16 MED ORDER — FENOFIBRATE 160 MG PO TABS
160.0000 mg | ORAL_TABLET | Freq: Every day | ORAL | Status: DC
  Administered 2014-11-16 – 2014-11-17 (×2): 160 mg via ORAL
  Filled 2014-11-16 (×2): qty 1

## 2014-11-16 MED ORDER — APIXABAN 5 MG PO TABS
5.0000 mg | ORAL_TABLET | Freq: Two times a day (BID) | ORAL | Status: DC
  Administered 2014-11-16 – 2014-11-17 (×3): 5 mg via ORAL
  Filled 2014-11-16 (×4): qty 1

## 2014-11-16 MED ORDER — PROPRANOLOL HCL 60 MG PO TABS: 60.0000 mg | ORAL_TABLET | Freq: Two times a day (BID) | ORAL | Status: DC

## 2014-11-16 NOTE — H&P (Signed)
[facility] History and Physical  Kristin Hood QPR:[id number] DOB: 1940/10/20 DOA: 11/16/2014  Referring physician: Dr.Ward. PCP: Gennette Pac, MD  Specialists: Dr.Skains. Cardiologist.  Chief Complaint: Chest pain and shortness of breath.  HPI: Kristin Hood is a 74 y.o. female with history of chronic atrial fibrillation, hypertension, hyperlipidemia, previous stroke, history of breast cancer presents to the ER because of chest pain. Patient has been having increasing shortness of breath over last month since her recent orthopedic surgery. Patient started having some chest pressure last evening which was retrosternal nonradiating increases on exertion and gets relieved by rest. In the ER EKG and cardiac markers were unremarkable. CT angiogram of the chest was done which was negative for PE but does show tracheomalacia and bronchitis with multi focal have a collapse and trapping. There was also mediastinal adenopathy with splenomegaly. Patient has been admitted for further management of chest pain and shortness of breath. Denies any fever chills productive cough.   Review of Systems: As presented in the history of presenting illness, rest negative.  Past Medical History  Diagnosis Date  . Hypertension     , With mild concentric left ventricular hypertrophy  . Shingles   . Atrial fibrillation 07/2012    Eliquis, rate controlled  . Hx of adenomatous colonic polyps 2006    , Benign  . Mixed dyslipidemia   . Coarse tremors     , Essential  . Hypercholesteremia   . Breast cancer 2001    left mastectomy  . Complication of anesthesia     SMALLER TUBE FOR INTUBATION AS GAGS ON TUBE  . Dysrhythmia 08/20/2012    Atrial Fibrillation  . 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 CVA (cerebral vascular accident) 2013  . Valvular sclerosis 09/23/2003    without stenosis  . History of chemotherapy 11/13/1998 to 2010    Adriamycin/Docotaxol, Dexorubicin/Taxotere,  Femara, Tamoxifen   Past Surgical History  Procedure Laterality Date  . Tee without cardioversion  11/19/2011    Procedure: TRANSESOPHAGEAL ECHOCARDIOGRAM (TEE);  Surgeon: [name], MD;  Location[facility] ENDOSCOPY;  Service: Cardiovascular;  Laterality: N/A;  . Knee surgery  2009    ,Total knee replacement  . Bunionectomy  11/29/1988    bilateral with hammer toes  . Left rotator cuff    . Cesarean section    . Left heart catheterization with coronary angiogram N/A 08/14/2011    Procedure: LEFT HEART CATHETERIZATION WITH CORONARY ANGIOGRAM;  Surgeon: [name], MD;  Location: [facility] CATH LAB;  Service: Cardiovascular;  Laterality: N/A;  . Eye surgery  01/31/2013    eye lid  . Appendectomy  07/10/1957  . Cesarean section  05/1968, 07/1965  . Mastectomy  10/19/1998    Radical, left -with lymph nodes (8 total)  . Reverse bunionectomy  08/29/1993    removal of Tibial Sesamoid-left  . Joint replacement  05/2001    left knee  . Joint replacement  11/2001    right knee  . Joint replacement  01/2008    redo right knee  . Biopsy thyroid  12/01/2013    ultrasound  . Cardiac catheterization  07/2010    , Without significant CAD  . Cardiac catheterization  07/2011    [name]  . Total hip arthroplasty Left 10/11/2014    Procedure: LEFT TOTAL HIP ARTHROPLASTY ANTERIOR APPROACH;  Surgeon: [name], MD;  Location: [facility] ORS;  Service: Orthopedics;  Laterality: Left;   Social History:  reports that she quit smoking about  22 years ago. Her smoking use included Cigarettes. She has a 32.5 pack-year smoking history. She has never used smokeless tobacco. She reports that she drinks about 0.5 oz of alcohol per week. She reports that she does not use illicit drugs. Where does patient live at home. Can patient participate in ADLs? Yes.  Allergies  Allergen Reactions  . Floxin [Ofloxacin] Nausea Only and Other (See Comments)    Dizziness, Vertigo    Family History:  Family History  Problem  Relation Age of Onset  . Heart disease Mother   . Heart disease Father   . Heart failure Father       Prior to Admission medications   Medication Sig Start Date End Date Taking? Authorizing Provider  amLODipine (NORVASC) 5 MG tablet Take 5 mg by mouth daily.   Yes Historical Provider, MD  amoxicillin (AMOXIL) 500 MG capsule Take 2,000 mg by mouth once. 1 hour prior to dental appointments   Yes Historical Provider, MD  atorvastatin (LIPITOR) 10 MG tablet Take 10 mg by mouth daily.    Yes Historical Provider, MD  ELIQUIS 5 MG TABS tablet Take 1 tablet by mouth two  times daily Patient taking differently: Take 1 tablet by mouth two  times daily @ 0800 & 1900 04/25/14  Yes Jerline Pain, MD  fenofibrate 160 MG tablet Take 160 mg by mouth daily.    Yes Historical Provider, MD  ferrous sulfate 325 (65 FE) MG EC tablet Take 325 mg by mouth every evening.   Yes Historical Provider, MD  HYDROcodone-acetaminophen (NORCO/VICODIN) 5-325 MG per tablet Take 1-2 tablets by mouth every 6 (six) hours as needed for moderate pain.  11/03/14  Yes Historical Provider, MD  losartan (COZAAR) 100 MG tablet Take 100 mg by mouth daily.   Yes Historical Provider, MD  Multiple Vitamin (MULTIVITAMIN WITH MINERALS) TABS tablet Take 1 tablet by mouth daily.   Yes Historical Provider, MD  Omega-3 Fatty Acids (FISH OIL PO) Take 2 capsules by mouth 2 (two) times daily.   Yes Historical Provider, MD  propranolol (INDERAL) 60 MG tablet Take 2 tablets in the morning, 1 in the afternoon, and 1 in the evening. Patient taking differently: Take 60-120 mg by mouth 2 (two) times daily. Take 2 tablets in the morning, 1 in the afternoon, and 1 in the evening. 07/31/14  Yes Jerline Pain, MD  methocarbamol (ROBAXIN) 500 MG tablet Take 1 tablet (500 mg total) by mouth every 6 (six) hours as needed for muscle spasms. Patient not taking: Reported on 11/16/2014 10/12/14   Arlee Muslim, PA-C  oxyCODONE (OXY IR/ROXICODONE) 5 MG immediate release  tablet Take 1-2 tablets (5-10 mg total) by mouth every 3 (three) hours as needed for moderate pain, severe pain or breakthrough pain. Patient not taking: Reported on 11/16/2014 10/12/14   Arlee Muslim, PA-C  traMADol (ULTRAM) 50 MG tablet Take 1-2 tablets (50-100 mg total) by mouth every 6 (six) hours as needed (mild pain). Patient not taking: Reported on 11/16/2014 10/12/14   Arlee Muslim, PA-C    Physical Exam: Filed Vitals:   11/16/14 0500 11/16/14 0530 11/16/14 0537 11/16/14 0614  BP: 159/72 146/84 146/84 149/79  Pulse: 86 80 88 97  Temp:      TempSrc:      Resp: 23 24 24 21   SpO2: 91% 91% 94% 95%     General:  Obese not in distress.  Eyes: Anicteric no pallor.  ENT: No discharge from the ears eyes nose and  mouth.  Neck: No mass felt. No JVD appreciated.  Cardiovascular: S1 and S2 heard.  Respiratory: No rhonchi or crepitations.  Abdomen: Soft nontender bowel sounds present.  Skin: No rash.  Musculoskeletal: No edema.  Psychiatric: Appears normal.  Neurologic: Alert awake oriented to time place and person. Moves all extremities.  Labs on Admission:  Basic Metabolic Panel:  Recent Labs Lab 11/16/14 0109  NA 138  K 4.3  CL 103  CO2 25  GLUCOSE 117*  BUN 14  CREATININE 0.66  CALCIUM 10.1   Liver Function Tests: No results for input(s): AST, ALT, ALKPHOS, BILITOT, PROT, ALBUMIN in the last 168 hours. No results for input(s): LIPASE, AMYLASE in the last 168 hours. No results for input(s): AMMONIA in the last 168 hours. CBC:  Recent Labs Lab 11/16/14 0109  WBC 4.2  HGB 11.6*  HCT 36.5  MCV 88.2  PLT 312   Cardiac Enzymes: No results for input(s): CKTOTAL, CKMB, CKMBINDEX, TROPONINI in the last 168 hours.  BNP (last 3 results)  Recent Labs  11/16/14 0039  BNP 298.4*    ProBNP (last 3 results) No results for input(s): PROBNP in the last 8760 hours.  CBG: No results for input(s): GLUCAP in the last 168 hours.  Radiological Exams on  Admission: Dg Chest 2 View  11/16/2014   CLINICAL DATA:  Acute onset of shortness of breath. Initial encounter.  EXAM: CHEST  2 VIEW  COMPARISON:  Chest radiograph performed 11/12/2013, and CT of the chest performed 06/21/2014  FINDINGS: The lungs are well-aerated. Vascular congestion is noted, with bibasilar airspace opacity. This may reflect mild interstitial edema or possibly pneumonia. There is no evidence of pleural effusion or pneumothorax.  The heart is mildly enlarged. No acute osseous abnormalities are seen. Density overlying the left lateral lung base reflects overlying soft tissues.  IMPRESSION: Vascular congestion and mild cardiomegaly, with bibasilar airspace opacity. This may reflect mild interstitial edema or possibly pneumonia.   Electronically Signed   By: Garald Balding M.D.   On: 11/16/2014 01:48   Ct Angio Chest Pe W/cm &/or Wo Cm  11/16/2014   CLINICAL DATA:  Chest pain and shortness of breath  EXAM: CT ANGIOGRAPHY CHEST WITH CONTRAST  TECHNIQUE: Multidetector CT imaging of the chest was performed using the standard protocol during bolus administration of intravenous contrast. Multiplanar CT image reconstructions and MIPs were obtained to evaluate the vascular anatomy.  CONTRAST:  103mL OMNIPAQUE IOHEXOL 350 MG/ML SOLN  COMPARISON:  06/21/2014  FINDINGS: THORACIC INLET/BODY WALL:  Left mastectomy.  Chronic right thyroid nodule with calcification, partly visible. Patient has undergone recent thyroid biopsy.  MEDIASTINUM:  Cardiomegaly without pericardial effusion. Diffuse atherosclerosis, including the coronary arteries. Mildly dilated ascending aorta at 40 mm on coronal reformats. Expect follow-up based on nodal findings.  Mediastinal adenopathy, especially sub- carina and lower periesophageal. The periesophageal tissue is similar with maximal thickness (23 mm) but the subcarinal node has increased in size, currently 24 mm short axis (previously 14 mm). Mild enlargement of bilateral hilar  lymph nodes as well, with calcification on the right.  No indication of acute pulmonary embolism. Subsegmental vessel evaluation is limited by intermittent motion, patient size, and bolus dispersion.  LUNG WINDOWS:  Intermittent flattening of the trachea, mainstem bronchi, and lobar airways. There is also mild diffuse bronchial wall thickening. Mosaic attenuation of the lungs, likely air trapping given the above. Mild patchy atelectasis. No suspected pneumonia. No edema, effusion, or pneumothorax.  UPPER ABDOMEN:  Chronic splenomegaly, partly visible.  OSSEOUS:  No acute fracture.  No suspicious lytic or blastic lesions.  Review of the MIP images confirms the above findings.  IMPRESSION: 1. Motion degraded study with no evidence of pulmonary embolism. 2. 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 3. Chronic mediastinal adenopathy and splenomegaly. 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 Most of the nodal tissue is stable in size, but the subcarinal node has enlarged since March 2016. Recommend follow-up with patient's hematologist.   Electronically Signed   By: Monte Fantasia M.D.   On: 11/16/2014 04:39    EKG: Independently reviewed. Atrial fibrillation rate control.  Assessment/Plan Principal Problem:   Chest pain Active Problems:   Chronic atrial fibrillation   SOB (shortness of breath)   Hypertension   1. Chest pain - patient has had a normal cardiac cath in 2013. At this time patient has exertional chest pain. We will cycle cardiac markers check 2-D echo. Patient is already on Apixaban propranolol and statins. 2. Shortness of breath - exertional. CT scan shows multifocal airway collapse and had trapping. I have discussed with on-call pulmonologist Dr. Ashby Dawes who has advised to get a formal former a consult in a.m. Check 2-D echo. 3. Mediastinal adenopathy and splenomegaly with history of breast  cancer - will need follow-up with patient's hematologist Dr. [name]. 4. Chronic atrial fibrillation rate controlled - patient's chads 2 vasc score is more than 2. Patient is on Apixaban.  5. 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 6. History of CVA on Apixaban. 7. Hypertension - continue present medications.  I have reviewed patient's old charts and labs. Personally reviewed x-rays and EKG.   DVT Prophylaxis Apixaban.  Code Status: DO NOT RESUSCITATE.  Family Communication: Discussed with patient's husband.  Disposition Plan: Admit for observation.    Marvin Grabill N. [facility] Pager [phone].  If 7PM-7AM, please contact night-coverage [email or web address] Password [facility] 11/16/2014, 6:21 AM

## 2014-11-16 NOTE — ED Notes (Signed)
Dr. Ward at bedside.

## 2014-11-16 NOTE — Progress Notes (Signed)
PATIENT DETAILS Name: Kristin Hood Age: 74 y.o. Sex: female Date of Birth: February 01, 1941 Admit Date: 11/16/2014 Admitting Physician Rise Patience, MD ZOX:[id number] Marigene Ehlers, MD  Subjective: No further chest pain. Shortness of breath better.  Assessment/Plan: Principal Problem: Chest pain: Currently chest pain-free, follow troponins. CTA chest negative for pulmonary embolism.  Active Problems: Exertional shortness of breath: No signs of overt CHF on exam-check echocardiogram. Follow troponins. CT findings are chronic and doubt related to current presentation. Trial of bronchodilators-follow  Mediastinal adenopathy and splenomegaly with history of breast cancer: Being followed by Dr. Howell Rucks last office note 07/17/14-glands are observation and repeat CT chest in one year.  Chronic atrial fibrillation: Rate controlled with propranolol, continue with Eliquis  Dyslipidemia: Continue statin and fenofibrate  Hypertension: Controlled, continue with propranolol and losartan  Disposition: Remain inpatient  Antimicrobial agents  See below  Anti-infectives    None      DVT Prophylaxis: Eliquis  Code Status: DNR  Family Communication None at bedside  Procedures: None  CONSULTS:  None  Time spent 30 minutes-Greater than 50% of this time was spent in counseling, explanation of diagnosis, planning of further management, and coordination of care.  MEDICATIONS: Scheduled Meds: . amLODipine  5 mg Oral Daily  . apixaban  5 mg Oral BID  . atorvastatin  10 mg Oral Daily  . fenofibrate  160 mg Oral Daily  . ferrous sulfate  325 mg Oral QPM  . losartan  100 mg Oral Daily  . multivitamin with minerals  1 tablet Oral Daily  . omega-3 acid ethyl esters  1 g Oral BID  . propranolol  120 mg Oral Daily  . propranolol  60 mg Oral Q1400  . propranolol  60 mg Oral Q2000   Continuous Infusions:  PRN Meds:.acetaminophen, HYDROcodone-acetaminophen,  nitroGLYCERIN, ondansetron (ZOFRAN) IV    PHYSICAL EXAM: Vital signs in last 24 hours: Filed Vitals:   11/16/14 1015 11/16/14 1100 11/16/14 1132 11/16/14 1200  BP: 156/83 136/68 153/99 141/56  Pulse: 82 91 79 90  Temp:    98.2 F (36.8 C)  TempSrc:    Oral  Resp: 20 27 21    Height:    5\' 5"  (1.651 m)  SpO2: 95% 94% 95% 94%    Weight change:  There were no vitals filed for this visit. There is no weight on file to calculate BMI.   Gen Exam: Awake and alert with clear speech.  Neck: Supple, No JVD.   Chest: B/L Clear.   CVS: S1 S2 Regular, no murmurs.  Abdomen: soft, BS +, non tender, non distended.  Extremities: no edema, lower extremities warm to touch. Neurologic: Non Focal.   Skin: No Rash.   Wounds: N/A.   Intake/Output from previous day: No intake or output data in the 24 hours ending 11/16/14 1244   LAB RESULTS: CBC  Recent Labs Lab 11/16/14 0109 11/16/14 0748  WBC 4.2 4.2  HGB 11.6* 11.2*  HCT 36.5 34.7*  PLT 312 245  MCV 88.2 88.1  MCH 28.0 28.4  MCHC 31.8 32.3  RDW 15.7* 15.5    Chemistries   Recent Labs Lab 11/16/14 0109 11/16/14 0748  NA 138  --   K 4.3  --   CL 103  --   CO2 25  --   GLUCOSE 117*  --   BUN 14  --   CREATININE 0.66 0.66  CALCIUM 10.1  --  CBG: No results for input(s): GLUCAP in the last 168 hours.  GFR CrCl cannot be calculated (Unknown ideal weight.).  Coagulation profile No results for input(s): INR, PROTIME in the last 168 hours.  Cardiac Enzymes  Recent Labs Lab 11/16/14 0748  TROPONINI <0.03    Invalid input(s): POCBNP No results for input(s): DDIMER in the last 72 hours. No results for input(s): HGBA1C in the last 72 hours. No results for input(s): CHOL, HDL, LDLCALC, TRIG, CHOLHDL, LDLDIRECT in the last 72 hours. No results for input(s): TSH, T4TOTAL, T3FREE, THYROIDAB in the last 72 hours.  Invalid input(s): FREET3 No results for input(s): VITAMINB12, FOLATE, FERRITIN, TIBC, IRON,  RETICCTPCT in the last 72 hours. No results for input(s): LIPASE, AMYLASE in the last 72 hours.  Urine Studies No results for input(s): UHGB, CRYS in the last 72 hours.  Invalid input(s): UACOL, UAPR, USPG, UPH, UTP, UGL, UKET, UBIL, UNIT, UROB, ULEU, UEPI, UWBC, URBC, UBAC, CAST, UCOM, BILUA  MICROBIOLOGY: No results found for this or any previous visit (from the past 240 hour(s)).  RADIOLOGY STUDIES/RESULTS: Dg Chest 2 View  11/16/2014   CLINICAL DATA:  Acute onset of shortness of breath. Initial encounter.  EXAM: CHEST  2 VIEW  COMPARISON:  Chest radiograph performed 11/12/2013, and CT of the chest performed 06/21/2014  FINDINGS: The lungs are well-aerated. Vascular congestion is noted, with bibasilar airspace opacity. This may reflect mild interstitial edema or possibly pneumonia. There is no evidence of pleural effusion or pneumothorax.  The heart is mildly enlarged. No acute osseous abnormalities are seen. Density overlying the left lateral lung base reflects overlying soft tissues.  IMPRESSION: Vascular congestion and mild cardiomegaly, with bibasilar airspace opacity. This may reflect mild interstitial edema or possibly pneumonia.   Electronically Signed   By: Garald Balding M.D.   On: 11/16/2014 01:48   Ct Angio Chest Pe W/cm &/or Wo Cm  11/16/2014   CLINICAL DATA:  Chest pain and shortness of breath  EXAM: CT ANGIOGRAPHY CHEST WITH CONTRAST  TECHNIQUE: Multidetector CT imaging of the chest was performed using the standard protocol during bolus administration of intravenous contrast. Multiplanar CT image reconstructions and MIPs were obtained to evaluate the vascular anatomy.  CONTRAST:  28mL OMNIPAQUE IOHEXOL 350 MG/ML SOLN  COMPARISON:  06/21/2014  FINDINGS: THORACIC INLET/BODY WALL:  Left mastectomy.  Chronic right thyroid nodule with calcification, partly visible. Patient has undergone recent thyroid biopsy.  MEDIASTINUM:  Cardiomegaly without pericardial effusion. Diffuse  atherosclerosis, including the coronary arteries. Mildly dilated ascending aorta at 40 mm on coronal reformats. Expect follow-up based on nodal findings.  Mediastinal adenopathy, especially sub- carina and lower periesophageal. The periesophageal tissue is similar with maximal thickness (23 mm) but the subcarinal node has increased in size, currently 24 mm short axis (previously 14 mm). Mild enlargement of bilateral hilar lymph nodes as well, with calcification on the right.  No indication of acute pulmonary embolism. Subsegmental vessel evaluation is limited by intermittent motion, patient size, and bolus dispersion.  LUNG WINDOWS:  Intermittent flattening of the trachea, mainstem bronchi, and lobar airways. There is also mild diffuse bronchial wall thickening. Mosaic attenuation of the lungs, likely air trapping given the above. Mild patchy atelectasis. No suspected pneumonia. No edema, effusion, or pneumothorax.  UPPER ABDOMEN:  Chronic splenomegaly, partly visible.  OSSEOUS:  No acute fracture.  No suspicious lytic or blastic lesions.  Review of the MIP images confirms the above findings.  IMPRESSION: 1. Motion degraded study with no evidence of pulmonary embolism.  2. 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 3. Chronic mediastinal adenopathy and splenomegaly. 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 Most of the nodal tissue is stable in size, but the subcarinal node has enlarged since March 2016. Recommend follow-up with patient's hematologist.   Electronically Signed   By: Monte Fantasia M.D.   On: 11/16/2014 04:39    Oren Binet, MD  [facility] Pager:[phone]  If 7PM-7AM, please contact night-coverage [email or web address] Password [facility] 11/16/2014, 12:44 PM

## 2014-11-16 NOTE — ED Notes (Signed)
Meal tray ordered for pt  

## 2014-11-16 NOTE — ED Provider Notes (Signed)
This chart was scribed for Trinity, DO by Irene Pap, ED Scribe. This patient was seen in room A09C/A09C and patient care was started at 1:47 AM.   TIME SEEN: 1:47 AM  CHIEF COMPLAINT: Chest pain and SOB  HPI:  CHARI PARMENTER is a 74 y.o. Female wit hx of HTN, AFib, breast cancer 15 years ago status post mastectomy, nodule on thyroid, hypercholesteremia, CVA who presents to the Emergency Department complaining of tight central chest pain and SOB that started yesterday night. She states that she put dinner in the oven and sat on the couch when she began to feel tightness in her chest. She said that she did not feel like eating tonight. Denies worsening or alleviating factors for her chest pain, but states that the pain is currently relieved. Husband states that she has been experiencing intermittent SOB since her hip surgery on 10/21/14; states that it worsens with activity, but says she has not been very active lately. Pt thought that she was having a panic attack because her nephew died recently of a PE after surgery. Pt denies nausea, vomiting, dizziness, fever, cough.  Patient has slightly diaphoretic in the room. Had a left hip replacement with Dr. Maureen Ralphs on 10/21/14.  PCP: Gennette Pac, MD  ROS: See HPI Constitutional: no fever  Eyes: no drainage  ENT: no runny nose   Cardiovascular: + CP Resp: + SOB GI: no vomiting GU: no dysuria Integumentary: no rash  Allergy: no hives  Musculoskeletal: no leg swelling  Neurological: no slurred speech ROS otherwise negative  PAST MEDICAL HISTORY/PAST SURGICAL HISTORY:  Past Medical History  Diagnosis Date  . Hypertension     , With mild concentric left ventricular hypertrophy  . Shingles   . Atrial fibrillation 07/2012    Eliquis, rate controlled  . Hx of adenomatous colonic polyps 2006    , Benign  . Mixed dyslipidemia   . Coarse tremors     , Essential  . Hypercholesteremia   . Breast cancer 2001    left mastectomy   . Complication of anesthesia     SMALLER TUBE FOR INTUBATION AS GAGS ON TUBE  . Dysrhythmia 08/20/2012    Atrial Fibrillation  . 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 CVA (cerebral vascular accident) 2013  . Valvular sclerosis 09/23/2003    without stenosis  . History of chemotherapy 11/13/1998 to 2010    Adriamycin/Docotaxol, Dexorubicin/Taxotere, Femara, Tamoxifen    MEDICATIONS:  Prior to Admission medications   Medication Sig Start Date End Date Taking? Authorizing Provider  acetaminophen (TYLENOL) 650 MG CR tablet Take 1,300 mg by mouth every 8 (eight) hours as needed for pain.    Historical Provider, MD  amLODipine (NORVASC) 5 MG tablet Take 5 mg by mouth daily.    Historical Provider, MD  amoxicillin (AMOXIL) 500 MG capsule Take 2,000 mg by mouth once. 1 hour prior to dental appointments    Historical Provider, MD  atorvastatin (LIPITOR) 10 MG tablet Take 10 mg by mouth daily.     Historical Provider, MD  ELIQUIS 5 MG TABS tablet Take 1 tablet by mouth two  times daily Patient taking differently: Take 1 tablet by mouth two  times daily @ 0800 & 1900 04/25/14   Jerline Pain, MD  fenofibrate 160 MG tablet Take 160 mg by mouth daily.     Historical Provider, MD  ferrous sulfate 325 (65 FE) MG EC tablet Take 325 mg by mouth every evening.  Historical Provider, MD  ketoconazole (NIZORAL) 2 % cream Apply 1 application topically as needed for irritation (rash).  05/09/14   Historical Provider, MD  losartan (COZAAR) 100 MG tablet Take 100 mg by mouth daily.    Historical Provider, MD  methocarbamol (ROBAXIN) 500 MG tablet Take 1 tablet (500 mg total) by mouth every 6 (six) hours as needed for muscle spasms. 10/12/14   Arlee Muslim, PA-C  oxyCODONE (OXY IR/ROXICODONE) 5 MG immediate release tablet Take 1-2 tablets (5-10 mg total) by mouth every 3 (three) hours as needed for moderate pain, severe pain or breakthrough pain. 10/12/14   Arlee Muslim, PA-C  propranolol (INDERAL) 60  MG tablet Take 2 tablets in the morning, 1 in the afternoon, and 1 in the evening. Patient taking differently: Take 60-120 mg by mouth 2 (two) times daily. Take 2 tablets in the morning, 1 in the afternoon, and 1 in the evening. 07/31/14   Jerline Pain, MD  traMADol (ULTRAM) 50 MG tablet Take 1-2 tablets (50-100 mg total) by mouth every 6 (six) hours as needed (mild pain). 10/12/14   Arlee Muslim, PA-C    ALLERGIES:  Allergies  Allergen Reactions  . Floxin [Ofloxacin] Nausea Only and Other (See Comments)    Dizziness, Vertigo    SOCIAL HISTORY:  Social History  Substance Use Topics  . Smoking status: Former Smoker -- 2.50 packs/day for 13 years    Types: Cigarettes    Quit date: 01/30/1992  . Smokeless tobacco: Never Used  . Alcohol Use: 0.5 oz/week    1 Standard drinks or equivalent per week     Comment: wine occassionally    FAMILY HISTORY: Family History  Problem Relation Age of Onset  . Heart disease Mother   . Heart disease Father   . Heart failure Father     EXAM: BP 165/80 mmHg  Pulse 96  Resp 20  SpO2 92%  CONSTITUTIONAL: Alert and oriented and responds appropriately to questions. Elderly, obese; in no significant distress, afebrile and nontoxic HEAD: Normocephalic EYES: Conjunctivae clear, PERRL ENT: normal nose; no rhinorrhea; moist mucous membranes; pharynx without lesions noted NECK: Supple, no meningismus, no LAD  CARD: irregularly irregular; S1 and S2 appreciated; no murmurs, no clicks, no rubs, no gallops RESP: Normal chest excursion without splinting or tachypnea; breath sounds clear and equal bilaterally; no wheezes, no rhonchi, no rales, no hypoxia or respiratory distress, speaking full sentences ABD/GI: Normal bowel sounds; non-distended; soft, non-tender, no rebound, no guarding, no peritoneal signs BACK:  The back appears normal and is non-tender to palpation, there is no CVA tenderness EXT: Healing surgical scar to the left lateral and anterior hip  without surrounding erythema or warmth, fluctuance or induration; Normal ROM in all joints; non-tender to palpation; no edema; normal capillary refill; no cyanosis, no calf tenderness or swelling    SKIN: Normal color for age and race; warm, patient is slightly diaphoretic NEURO: Moves all extremities equally, sensation to light touch intact diffusely, cranial nerves II through XII intact PSYCH: The patient's mood and manner are appropriate. Grooming and personal hygiene are appropriate.  MEDICAL DECISION MAKING: Patient here with chest pain and shortness of breath. She has multiple risk factors for ACS as well as for pulmonary embolus. EKG shows atrial fibrillation which she has a history of. She is on Eliquis.  Troponin is negative. Chest x-ray shows possible edema versus pneumonia. She is very concerned that she may have a pulmonary embolus but this is less likely given she  is on anticoagulation. We'll obtain a CT of her chest for further evaluation given patient's significant concern and findings on chest x-ray of edema versus pneumonia. She is currently chest pain-free. Anticipate admission.  ED PROGRESS: Patient's labs are unremarkable other than a slightly elevated BNP. Troponin negative. CT of the chest shows no obvious pulmonary embolus but is degraded by motion artifact. 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 There is some enlargement of a subcarinal lymph node since CT scan March 2016. Discussed this with patient and have recommended close outpatient follow-up with her oncologist. She is still asymptomatic but given her risk factors for ACS and symptoms I would like to admit her for chest pain rule out. She agrees with this plan.  5:20 AM  D/w Hal Hope with hospitalist service for admission to telemetry, observation.    EKG  Interpretation  Date/Time:  Thursday November 16 2014 00:29:37 EDT Ventricular Rate:  95 PR Interval:    QRS Duration: 76 QT Interval:  346 QTC Calculation: 434 R Axis:   -21 Text Interpretation:  Atrial fibrillation with premature ventricular or aberrantly conducted complexes Anterior infarct , age undetermined Abnormal ECG No significant change since last tracing Confirmed by WARD,  DO, KRISTEN ([id number]) on 11/16/2014 1:38:41 AM         I personally performed the services described in this documentation, which was scribed in my presence. The recorded information has been reviewed and is accurate.   [place], DO 11/16/14 [phone]

## 2014-11-16 NOTE — ED Notes (Addendum)
C/o heaviness to center of chest with sob that started this evening.  Denies nausea and vomiting but states she just didn't feel like eating dinner.  Pt extremely anxious due to recent hip surgery and states her son-in-law passed away from a blood clot a few weeks after having surgery last year.

## 2014-11-16 NOTE — Discharge Instructions (Signed)

## 2014-11-16 NOTE — ED Notes (Signed)
CT notified that pt has IV 20G and is ready to go.

## 2014-11-17 ENCOUNTER — Observation Stay (HOSPITAL_COMMUNITY): Payer: Medicare Other

## 2014-11-17 DIAGNOSIS — R0602 Shortness of breath: Secondary | ICD-10-CM | POA: Diagnosis not present

## 2014-11-17 DIAGNOSIS — R079 Chest pain, unspecified: Secondary | ICD-10-CM

## 2014-11-17 DIAGNOSIS — I10 Essential (primary) hypertension: Secondary | ICD-10-CM | POA: Diagnosis not present

## 2014-11-17 DIAGNOSIS — R072 Precordial pain: Secondary | ICD-10-CM

## 2014-11-17 DIAGNOSIS — I482 Chronic atrial fibrillation: Secondary | ICD-10-CM

## 2014-11-17 MED ORDER — ALBUTEROL SULFATE HFA 108 (90 BASE) MCG/ACT IN AERS: 2.0000 | INHALATION_SPRAY | RESPIRATORY_TRACT | Status: DC | PRN

## 2014-11-17 NOTE — Consult Note (Signed)
Cardiologist: [name]  Reason for Consult: Chest Pain Referring Physician:   [name] is an 74 y.o. female.  HPI:   The patient is a 74 yo female with a history of HTN, A fib, murmur, dyslipidemia, breast cancer, CVA, valvular sclerosis, chemotherapy.  She is on chronic anticoagulation with apixiban.  Echocardiogram on April 2015 revealed an ejection fraction 55-60% with normal wall motion. Mild aortic valve regurgitation.  She had stress test which showed inferior ischemia, transient ischemic dilatation with normal ejection fraction.   Her last cardiac catheterization was 08/14/2011 revealed no angiographically significant CAD. Showed normal left ventricular systolic function with an EF of 65% by echo and LVEDP of 14 mmHg.    Patient was admitted with chest pain shortness of breath. She reports the shortness of breath and chest pressure developed Wednesday afternoon.  Chest pressure is worse was 4 out of 10 in intensity. This resolved spontaneously the following morning.   Shortness of breath resolved after receiving breathing treatments. She was CT angiogram which was negative for PE but did reveal tracheomalacia and bronchitis with multi focal have a collapse and trapping. The patient currently denies nausea, vomiting, fever, orthopnea, dizziness, PND, cough, congestion, abdominal pain, hematochezia, melena, lower extremity edema, claudication.    Past Medical History  Diagnosis Date  . Hypertension     , With mild concentric left ventricular hypertrophy  . Shingles   . Atrial fibrillation 07/2012    Eliquis, rate controlled  . Hx of adenomatous colonic polyps 2006    , Benign  . Mixed dyslipidemia   . Coarse tremors     , Essential  . Hypercholesteremia   . Breast cancer 2001    left mastectomy  . Complication of anesthesia     SMALLER TUBE FOR INTUBATION AS GAGS ON TUBE  . Dysrhythmia 08/20/2012    Atrial Fibrillation  . Stroke 11/17/2011    left side brain-speech-TPA   . CVA (cerebral vascular accident) 2013  . Valvular sclerosis 09/23/2003    without stenosis  . History of chemotherapy 11/13/1998 to 2010    Adriamycin/Docotaxol, Dexorubicin/Taxotere, Femara, Tamoxifen  . Heart murmur   . Thyroid nodule   . Tremor, essential     Past Surgical History  Procedure Laterality Date  . Tee without cardioversion  11/19/2011    Procedure: TRANSESOPHAGEAL ECHOCARDIOGRAM (TEE);  Surgeon: [name], MD;  Location: [facility] ENDOSCOPY;  Service: Cardiovascular;  Laterality: N/A;  . Knee surgery  2009    ,Total knee replacement  . Bunionectomy  11/29/1988    bilateral with hammer toes  . Left rotator cuff    . Cesarean section    . Left heart catheterization with coronary angiogram N/A 08/14/2011    Procedure: LEFT HEART CATHETERIZATION WITH CORONARY ANGIOGRAM;  Surgeon: [name], MD;  Location: [facility] CATH LAB;  Service: Cardiovascular;  Laterality: N/A;  . Eye surgery  01/31/2013    eye lid  . Appendectomy  07/10/1957  . Cesarean section  05/1968, 07/1965  . Mastectomy  10/19/1998    Radical, left -with lymph nodes (8 total)  . Reverse bunionectomy  08/29/1993    removal of Tibial Sesamoid-left  . Joint replacement  05/2001    left knee  . Joint replacement  11/2001    right knee  . Joint replacement  01/2008    redo right knee  . Biopsy thyroid  12/01/2013    ultrasound  . Cardiac catheterization  07/2010    , Without significant CAD  .  Cardiac catheterization  07/2011    [name]  . Total hip arthroplasty Left 10/11/2014    Procedure: LEFT TOTAL HIP ARTHROPLASTY ANTERIOR APPROACH;  Surgeon: [name], MD;  Location: [facility] ORS;  Service: Orthopedics;  Laterality: Left;    Family History  Problem Relation Age of Onset  . Heart disease Mother   . Heart disease Father   . Heart failure Father     Social History:  reports that she quit smoking about 22 years ago. Her smoking use included Cigarettes. She has a 32.5 pack-year smoking history. She  has never used smokeless tobacco. She reports that she drinks about 0.5 oz of alcohol per week. She reports that she does not use illicit drugs.  Allergies:  Allergies  Allergen Reactions  . Floxin [Ofloxacin] Nausea Only and Other (See Comments)    Dizziness, Vertigo    Medications:  Scheduled Meds: . amLODipine  5 mg Oral Daily  . apixaban  5 mg Oral BID  . atorvastatin  10 mg Oral Daily  . fenofibrate  160 mg Oral Daily  . ferrous sulfate  325 mg Oral QPM  . ipratropium-albuterol  3 mL Nebulization TID  . losartan  100 mg Oral Daily  . multivitamin with minerals  1 tablet Oral Daily  . omega-3 acid ethyl esters  1 g Oral BID  . propranolol  120 mg Oral Daily  . propranolol  60 mg Oral Q1400  . propranolol  60 mg Oral Q2000   Continuous Infusions:  PRN Meds:.acetaminophen, albuterol, HYDROcodone-acetaminophen, nitroGLYCERIN, ondansetron (ZOFRAN) IV   Results for orders placed or performed during the hospital encounter of 11/16/14 (from the past 48 hour(s))  Brain natriuretic peptide     Status: Abnormal   Collection Time: 11/16/14 12:39 AM  Result Value Ref Range   B Natriuretic Peptide 298.4 (H) 0.0 - 100.0 pg/mL  I-stat troponin, ED     Status: None   Collection Time: 11/16/14 12:48 AM  Result Value Ref Range   Troponin i, poc 0.01 0.00 - 0.08 ng/mL   Comment 3            Comment: Due to the release kinetics of cTnI, a negative result within the first hours of the onset of symptoms does not rule out myocardial infarction with certainty. If myocardial infarction is still suspected, repeat the test at appropriate intervals.   Basic metabolic panel     Status: Abnormal   Collection Time: 11/16/14  1:09 AM  Result Value Ref Range   Sodium 138 135 - 145 mmol/L   Potassium 4.3 3.5 - 5.1 mmol/L   Chloride 103 101 - 111 mmol/L   CO2 25 22 - 32 mmol/L   Glucose, Bld 117 (H) 65 - 99 mg/dL   BUN 14 6 - 20 mg/dL   Creatinine, Ser 0.66 0.44 - 1.00 mg/dL   Calcium 10.1  8.9 - 10.3 mg/dL   GFR calc non Af Amer >60 >60 mL/min   GFR calc Af Amer >60 >60 mL/min    Comment: (NOTE) The eGFR has been calculated using the CKD EPI equation. This calculation has not been validated in all clinical situations. eGFR's persistently <60 mL/min signify possible Chronic Kidney Disease.    Anion gap 10 5 - 15  CBC     Status: Abnormal   Collection Time: 11/16/14  1:09 AM  Result Value Ref Range   WBC 4.2 4.0 - 10.5 K/uL   RBC 4.14 3.87 - 5.11 MIL/uL  Hemoglobin 11.6 (L) 12.0 - 15.0 g/dL   HCT 36.5 36.0 - 46.0 %   MCV 88.2 78.0 - 100.0 fL   MCH 28.0 26.0 - 34.0 pg   MCHC 31.8 30.0 - 36.0 g/dL   RDW 15.7 (H) 11.5 - 15.5 %   Platelets 312 150 - 400 K/uL  Troponin I (q 6hr x 3)     Status: None   Collection Time: 11/16/14  7:48 AM  Result Value Ref Range   Troponin I <0.03 <0.031 ng/mL    Comment:        NO INDICATION OF MYOCARDIAL INJURY.   CBC     Status: Abnormal   Collection Time: 11/16/14  7:48 AM  Result Value Ref Range   WBC 4.2 4.0 - 10.5 K/uL   RBC 3.94 3.87 - 5.11 MIL/uL   Hemoglobin 11.2 (L) 12.0 - 15.0 g/dL   HCT 34.7 (L) 36.0 - 46.0 %   MCV 88.1 78.0 - 100.0 fL   MCH 28.4 26.0 - 34.0 pg   MCHC 32.3 30.0 - 36.0 g/dL   RDW 15.5 11.5 - 15.5 %   Platelets 245 150 - 400 K/uL  Creatinine, serum     Status: None   Collection Time: 11/16/14  7:48 AM  Result Value Ref Range   Creatinine, Ser 0.66 0.44 - 1.00 mg/dL   GFR calc non Af Amer >60 >60 mL/min   GFR calc Af Amer >60 >60 mL/min    Comment: (NOTE) The eGFR has been calculated using the CKD EPI equation. This calculation has not been validated in all clinical situations. eGFR's persistently <60 mL/min signify possible Chronic Kidney Disease.   Troponin I (q 6hr x 3)     Status: None   Collection Time: 11/16/14  2:10 PM  Result Value Ref Range   Troponin I <0.03 <0.031 ng/mL    Comment:        NO INDICATION OF MYOCARDIAL INJURY.   Troponin I (q 6hr x 3)     Status: None   Collection  Time: 11/16/14  7:10 PM  Result Value Ref Range   Troponin I <0.03 <0.031 ng/mL    Comment:        NO INDICATION OF MYOCARDIAL INJURY.     Dg Chest 2 View  11/16/2014   CLINICAL DATA:  Acute onset of shortness of breath. Initial encounter.  EXAM: CHEST  2 VIEW  COMPARISON:  Chest radiograph performed 11/12/2013, and CT of the chest performed 06/21/2014  FINDINGS: The lungs are well-aerated. Vascular congestion is noted, with bibasilar airspace opacity. This may reflect mild interstitial edema or possibly pneumonia. There is no evidence of pleural effusion or pneumothorax.  The heart is mildly enlarged. No acute osseous abnormalities are seen. Density overlying the left lateral lung base reflects overlying soft tissues.  IMPRESSION: Vascular congestion and mild cardiomegaly, with bibasilar airspace opacity. This may reflect mild interstitial edema or possibly pneumonia.   Electronically Signed   By: Garald Balding M.D.   On: 11/16/2014 01:48   Ct Angio Chest Pe W/cm &/or Wo Cm  11/16/2014   CLINICAL DATA:  Chest pain and shortness of breath  EXAM: CT ANGIOGRAPHY CHEST WITH CONTRAST  TECHNIQUE: Multidetector CT imaging of the chest was performed using the standard protocol during bolus administration of intravenous contrast. Multiplanar CT image reconstructions and MIPs were obtained to evaluate the vascular anatomy.  CONTRAST:  57m OMNIPAQUE IOHEXOL 350 MG/ML SOLN  COMPARISON:  06/21/2014  FINDINGS: THORACIC INLET/BODY  WALL:  Left mastectomy.  Chronic right thyroid nodule with calcification, partly visible. Patient has undergone recent thyroid biopsy.  MEDIASTINUM:  Cardiomegaly without pericardial effusion. Diffuse atherosclerosis, including the coronary arteries. Mildly dilated ascending aorta at 40 mm on coronal reformats. Expect follow-up based on nodal findings.  Mediastinal adenopathy, especially sub- carina and lower periesophageal. The periesophageal tissue is similar with maximal thickness (23  mm) but the subcarinal node has increased in size, currently 24 mm short axis (previously 14 mm). Mild enlargement of bilateral hilar lymph nodes as well, with calcification on the right.  No indication of acute pulmonary embolism. Subsegmental vessel evaluation is limited by intermittent motion, patient size, and bolus dispersion.  LUNG WINDOWS:  Intermittent flattening of the trachea, mainstem bronchi, and lobar airways. There is also mild diffuse bronchial wall thickening. Mosaic attenuation of the lungs, likely air trapping given the above. Mild patchy atelectasis. No suspected pneumonia. No edema, effusion, or pneumothorax.  UPPER ABDOMEN:  Chronic splenomegaly, partly visible.  OSSEOUS:  No acute fracture.  No suspicious lytic or blastic lesions.  Review of the MIP images confirms the above findings.  IMPRESSION: 1. Motion degraded study with no evidence of pulmonary embolism. 2. 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 3. Chronic mediastinal adenopathy and splenomegaly. 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 Most of the nodal tissue is stable in size, but the subcarinal node has enlarged since March 2016. Recommend follow-up with patient's hematologist.   Electronically Signed   By: Monte Fantasia M.D.   On: 11/16/2014 04:39    Review of Systems  Constitutional: Negative for fever and diaphoresis.  Respiratory: Positive for shortness of breath. Negative for cough.   Cardiovascular: Positive for chest pain. Negative for palpitations, orthopnea, leg swelling and PND.  Gastrointestinal: Negative for nausea, vomiting, abdominal pain, blood in stool and melena.  Genitourinary: Negative for dysuria.  Musculoskeletal: Negative for myalgias.  Neurological: Negative for dizziness.  All other systems reviewed and are negative.  Blood pressure 123/89, pulse 78, temperature 97.7 F (36.5 C), temperature  source Oral, resp. rate 20, height _0  (1.651 m), weight 258 lb 13.1 oz (117.4 kg), SpO2 97 %. Physical Exam  Constitutional: She is oriented to person, place, and time. She appears well-developed.  Obese   HENT:  Head: Normocephalic and atraumatic.  Eyes: EOM are normal. Pupils are equal, round, and reactive to light. No scleral icterus.  Neck: Normal range of motion. Neck supple.  Cardiovascular: Normal rate, S1 normal and S2 normal.  An irregularly irregular rhythm present.  No murmur heard. Pulses:      Radial pulses are 2+ on the right side, and 2+ on the left side.       Dorsalis pedis pulses are 2+ on the right side, and 2+ on the left side.  Respiratory: Effort normal and breath sounds normal. She has no wheezes. She has no rales.  GI: Soft. Bowel sounds are normal. She exhibits no distension. There is no tenderness.  Musculoskeletal: She exhibits no edema.  Neurological: She is alert and oriented to person, place, and time. She exhibits normal muscle tone.  Skin: Skin is warm and dry.  Psychiatric: She has a normal mood and affect.    Assessment/Plan: Principal Problem:   Chest pain Active Problems:   Chronic atrial fibrillation   SOB (shortness of breath)   Hypertension  The patient had an essentially normal left heart cath in 2013.  She ruled out for MI and there are no acute EKG changes.  She is in chronic Afib and taking Eliquis.  CT was negative for PE and did show Bronchitis and tracheobronchomalacia with multifocal airway collapse and air trapping, chronic based on 2015 CT.  This sounds more like a pulmonary issue.  She note significant improvement after Duonebs.  Will review echo once complete.     Tarri Fuller, [facility] 11/17/2014, 10:45 AM   Patient seen and examined with Tarri Fuller PA-C. We discussed all aspects of the encounter. I agree with the assessment and plan as stated above. 74 y/o former smoker with morbid obesity and chronic AF presents with CP and  worsening SOB after recent knee replacement.  ECG and CEs are negative. Cath in 2013 with minimal CAD. BNP 298 without evidence of obvious volume overload on exam. CT chest no PE but + parenchymal lung disease. Will await echo but suspect this is likely mostly pulmonary in nature. Can try one dose IV lasix. Would get Pulmonary input. Check exertional O2 sats and PFTs. We will follow as needed based on echo results.   Bensimhon, Daniel,MD 12:12 PM

## 2014-11-17 NOTE — Discharge Summary (Signed)
PATIENT DETAILS Name: Kristin Hood Age: 74 y.o. Sex: female Date of Birth: 1940-05-06 MRN: [id number]. Admitting Physician: Rise Patience, MD DUK:GURKYH,CWCBJ Marigene Ehlers, MD  Admit Date: 11/16/2014 Discharge date: 11/17/2014  Recommendations for Outpatient Follow-up:  1. Please ensure follow up with Dr [name] for formal PFT's-appt scheduled. See below.  2. 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   PRIMARY DISCHARGE DIAGNOSIS:  Principal Problem:   Chest pain Active Problems:   Chronic atrial fibrillation   SOB (shortness of breath)   Hypertension      PAST MEDICAL HISTORY: Past Medical History  Diagnosis Date  . Hypertension     , With mild concentric left ventricular hypertrophy  . Shingles   . Atrial fibrillation 07/2012    Eliquis, rate controlled  . Hx of adenomatous colonic polyps 2006    , Benign  . Mixed dyslipidemia   . Coarse tremors     , Essential  . Hypercholesteremia   . Breast cancer 2001    left mastectomy  . Complication of anesthesia     SMALLER TUBE FOR INTUBATION AS GAGS ON TUBE  . Dysrhythmia 08/20/2012    Atrial Fibrillation  . 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 CVA (cerebral vascular accident) 2013  . Valvular sclerosis 09/23/2003    without stenosis  . History of chemotherapy 11/13/1998 to 2010    Adriamycin/Docotaxol, Dexorubicin/Taxotere, Femara, Tamoxifen  . Heart murmur   . Thyroid nodule   . Tremor, essential     DISCHARGE MEDICATIONS: Current Discharge Medication List    START taking these medications   Details  albuterol (PROVENTIL HFA;VENTOLIN HFA) 108 (90 BASE) MCG/ACT inhaler Inhale 2 puffs into the lungs every 4 (four) hours as needed for wheezing or shortness of breath. Qty: 1 Inhaler, Refills: 1      CONTINUE these medications which have NOT CHANGED   Details  amLODipine (NORVASC) 5 MG tablet Take 5 mg by mouth daily.      amoxicillin (AMOXIL) 500 MG capsule Take 2,000 mg by mouth once. 1 hour prior to dental appointments    atorvastatin (LIPITOR) 10 MG tablet Take 10 mg by mouth daily.     ELIQUIS 5 MG TABS tablet Take 1 tablet by mouth two  times daily Qty: 180 tablet, Refills: 1    fenofibrate 160 MG tablet Take 160 mg by mouth daily.     ferrous sulfate 325 (65 FE) MG EC tablet Take 325 mg by mouth every evening.    HYDROcodone-acetaminophen (NORCO/VICODIN) 5-325 MG per tablet Take 1-2 tablets by mouth every 6 (six) hours as needed for moderate pain.  Refills: 0    losartan (COZAAR) 100 MG tablet Take 100 mg by mouth daily.    Multiple Vitamin (MULTIVITAMIN WITH MINERALS) TABS tablet Take 1 tablet by mouth daily.    Omega-3 Fatty Acids (FISH OIL PO) Take 2 capsules by mouth 2 (two) times daily.    propranolol (INDERAL) 60 MG tablet Take 2 tablets in the morning, 1 in the afternoon, and 1 in the evening. Qty: 360 tablet, Refills: 3      STOP taking these medications     methocarbamol (ROBAXIN) 500 MG tablet      oxyCODONE (OXY IR/ROXICODONE) 5 MG immediate release tablet      traMADol (ULTRAM) 50 MG tablet         ALLERGIES:   Allergies  Allergen Reactions  .  Floxin [Ofloxacin] Nausea Only and Other (See Comments)    Dizziness, Vertigo    BRIEF HPI:  See H&P, Labs, Consult and Test reports for all details in brief, patient was admitted for evaluation of exertional SOB and chest pain.   CONSULTATIONS:   cardiology  PERTINENT RADIOLOGIC STUDIES: Dg Chest 2 View  11/16/2014   CLINICAL DATA:  Acute onset of shortness of breath. Initial encounter.  EXAM: CHEST  2 VIEW  COMPARISON:  Chest radiograph performed 11/12/2013, and CT of the chest performed 06/21/2014  FINDINGS: The lungs are well-aerated. Vascular congestion is noted, with bibasilar airspace opacity. This may reflect mild interstitial edema or possibly pneumonia. There is no evidence of pleural effusion or pneumothorax.  The  heart is mildly enlarged. No acute osseous abnormalities are seen. Density overlying the left lateral lung base reflects overlying soft tissues.  IMPRESSION: Vascular congestion and mild cardiomegaly, with bibasilar airspace opacity. This may reflect mild interstitial edema or possibly pneumonia.   Electronically Signed   By: Garald Balding M.D.   On: 11/16/2014 01:48   Ct Angio Chest Pe W/cm &/or Wo Cm  11/16/2014   CLINICAL DATA:  Chest pain and shortness of breath  EXAM: CT ANGIOGRAPHY CHEST WITH CONTRAST  TECHNIQUE: Multidetector CT imaging of the chest was performed using the standard protocol during bolus administration of intravenous contrast. Multiplanar CT image reconstructions and MIPs were obtained to evaluate the vascular anatomy.  CONTRAST:  85mL OMNIPAQUE IOHEXOL 350 MG/ML SOLN  COMPARISON:  06/21/2014  FINDINGS: THORACIC INLET/BODY WALL:  Left mastectomy.  Chronic right thyroid nodule with calcification, partly visible. Patient has undergone recent thyroid biopsy.  MEDIASTINUM:  Cardiomegaly without pericardial effusion. Diffuse atherosclerosis, including the coronary arteries. Mildly dilated ascending aorta at 40 mm on coronal reformats. Expect follow-up based on nodal findings.  Mediastinal adenopathy, especially sub- carina and lower periesophageal. The periesophageal tissue is similar with maximal thickness (23 mm) but the subcarinal node has increased in size, currently 24 mm short axis (previously 14 mm). Mild enlargement of bilateral hilar lymph nodes as well, with calcification on the right.  No indication of acute pulmonary embolism. Subsegmental vessel evaluation is limited by intermittent motion, patient size, and bolus dispersion.  LUNG WINDOWS:  Intermittent flattening of the trachea, mainstem bronchi, and lobar airways. There is also mild diffuse bronchial wall thickening. Mosaic attenuation of the lungs, likely air trapping given the above. Mild patchy atelectasis. No suspected  pneumonia. No edema, effusion, or pneumothorax.  UPPER ABDOMEN:  Chronic splenomegaly, partly visible.  OSSEOUS:  No acute fracture.  No suspicious lytic or blastic lesions.  Review of the MIP images confirms the above findings.  IMPRESSION: 1. Motion degraded study with no evidence of pulmonary embolism. 2. 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 3. Chronic mediastinal adenopathy and splenomegaly. 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 Most of the nodal tissue is stable in size, but the subcarinal node has enlarged since March 2016. Recommend follow-up with patient's hematologist.   Electronically Signed   By: Monte Fantasia M.D.   On: 11/16/2014 04:39     PERTINENT LAB RESULTS: CBC:  Recent Labs  11/16/14 0109 11/16/14 0748  WBC 4.2 4.2  HGB 11.6* 11.2*  HCT 36.5 34.7*  PLT 312 245   CMET CMP     Component Value Date/Time   NA 138 11/16/2014 0109   NA 141 07/17/2014 1344   K 4.3 11/16/2014 0109  K 5.0 07/17/2014 1344   CL 103 11/16/2014 0109   CO2 25 11/16/2014 0109   CO2 24 07/17/2014 1344   GLUCOSE 117* 11/16/2014 0109   GLUCOSE 98 07/17/2014 1344   BUN 14 11/16/2014 0109   BUN 22.1 07/17/2014 1344   CREATININE 0.66 11/16/2014 0748   CREATININE 0.8 07/17/2014 1344   CALCIUM 10.1 11/16/2014 0109   CALCIUM 9.9 07/17/2014 1344   PROT 7.8 10/04/2014 1510   PROT 7.6 07/17/2014 1344   ALBUMIN 3.9 10/04/2014 1510   ALBUMIN 3.7 07/17/2014 1344   AST 32 10/04/2014 1510   AST 25 07/17/2014 1344   ALT 22 10/04/2014 1510   ALT 19 07/17/2014 1344   ALKPHOS 35* 10/04/2014 1510   ALKPHOS 41 07/17/2014 1344   BILITOT 1.0 10/04/2014 1510   BILITOT 0.88 07/17/2014 1344   GFRNONAA >60 11/16/2014 0748   GFRAA >60 11/16/2014 0748    GFR Estimated Creatinine Clearance: 77.9 mL/min (by C-G formula based on Cr of 0.66). No results for input(s): LIPASE, AMYLASE in the last 72  hours.  Recent Labs  11/16/14 0748 11/16/14 1410 11/16/14 1910  TROPONINI <0.03 <0.03 <0.03   Invalid input(s): POCBNP No results for input(s): DDIMER in the last 72 hours. No results for input(s): HGBA1C in the last 72 hours. No results for input(s): CHOL, HDL, LDLCALC, TRIG, CHOLHDL, LDLDIRECT in the last 72 hours. No results for input(s): TSH, T4TOTAL, T3FREE, THYROIDAB in the last 72 hours.  Invalid input(s): FREET3 No results for input(s): VITAMINB12, FOLATE, FERRITIN, TIBC, IRON, RETICCTPCT in the last 72 hours. Coags: No results for input(s): INR in the last 72 hours.  Invalid input(s): PT Microbiology: No results found for this or any previous visit (from the past 240 hour(s)).   BRIEF HOSPITAL COURSE:  Chest pain: No further chest pain, cardiac enzymes negative. Echo with preserved EF, seen by cards-since Echo shows preserved EF with no regional wall motion abnormality-no further work up recommended by cardiology. Current suspicion for pulmonary air trapping as the etiology. Note CTA chest negative for pulmonary embolism.  Active Problems: Exertional shortness of breath: No signs of overt CHF on exam-check echocardiogram. Follow troponins. CT chest showed Bronchitis and tracheobronchomalacia with multifocal airwaycollapse and air trapping findings chronic (chronic on 2015 CT per radiology report). Did have a few rhonchi on exam-but no other signs of airway obstruction. Given a trial of bronchodilators-feels much better-with no SOB this am. CT Chest was reviewed by Dr [name], and after discussion of clinical context over the phone-he suggested outpatient follow up with PCCM for formal PFT's. Will discharge home on Albuterol inhaler.   Mediastinal adenopathy and splenomegaly with history of breast cancer: Being followed by Dr. Howell Rucks last office note 07/17/14-glands are observation and repeat CT chest in one year.  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   Objective:   Blood pressure 151/56, pulse 97, temperature 98.3 F (36.8 C), temperature source Oral, resp. rate 20, height 5\' 5"  (1.651 m), weight 117.4 kg (258 lb 13.1 oz), SpO2 95 %. No intake or output data  in the 24 hours ending 11/17/14 1625 Filed Weights   11/16/14 1622  Weight: 117.4 kg (258 lb 13.1 oz)    Exam Awake Alert, Oriented *3, No new F.N deficits, Normal affect [place].AT,PERRAL Supple Neck,No JVD, No cervical lymphadenopathy appriciated.  Symmetrical Chest wall movement, Good air movement bilaterally, CTAB RRR,No Gallops,Rubs or new Murmurs, No Parasternal Heave +ve B.Sounds, Abd Soft, Non tender, No organomegaly appriciated, No rebound -guarding or rigidity. No Cyanosis, Clubbing or edema, No new Rash or bruise  DISCHARGE CONDITION: Stable  DISPOSITION: Home  DISCHARGE INSTRUCTIONS:    Activity:  As tolerated  Diet recommendation: Heart Healthy diet  Discharge Instructions    Call MD for:  difficulty breathing, headache or visual disturbances    Complete by:  As directed      Diet - low sodium heart healthy    Complete by:  As directed      Increase activity slowly    Complete by:  As directed            Follow-up Information    Follow up with Collene Gobble., MD On 12/07/2014.   Specialty:  Pulmonary Disease   Why:  appointment at 11:30 am. For post hospital discharge follow up with the lung doctor   Contact information:   [address] [address] [phone]        Follow up with Gennette Pac, MD. Schedule an appointment as soon as possible for a visit in 1 week.   Specialty:  Family Medicine   Contact information:   [place] [address] [phone]       Total Time spent on discharge equals 25  minutes.  SignedOren Binet 11/17/2014 4:25 PM

## 2014-11-17 NOTE — Progress Notes (Signed)
  Echocardiogram 2D Echocardiogram has been performed.  Kristin Hood 11/17/2014, 2:03 PM

## 2014-11-17 NOTE — Progress Notes (Signed)
Pt discharge education and instructions completed with pt and spouse at bedside; both voices understanding and denies any questions. Pt IV and telemetry removed; pt discharge home with spouse to transport her home. Pt handed her prescription for inhaler; pt transported off unit via wheelchair with belongings and spouse at side. Francis Gaines Westyn Keatley RN.

## 2014-11-19 ENCOUNTER — Other Ambulatory Visit: Payer: Self-pay | Admitting: Cardiology

## 2014-11-20 ENCOUNTER — Telehealth: Payer: Self-pay | Admitting: *Deleted

## 2014-11-20 NOTE — Telephone Encounter (Signed)
-----   Message from Curt Bears, MD sent at 11/20/2014  4:39 PM EDT ----- Regarding: RE: recent hospital visit Follow up as scheduled. Lymph nodes could increase in size with inflammation in the lung. ----- Message -----    From: Lucile Crater, RN    Sent: 11/20/2014   4:02 PM      To: Valrie Hart, RN, Curt Bears, MD Subject: recent hospital visit                          Dr. [name],  [place] called;  recent hospital stay due to short of breath and chest pain.   Pt asked MD to review her reports in chart and determine if a F/u visit is needed prior to her current July 23, 2015 lab/MD visit.  Please advise thanks, [facility]

## 2014-11-20 NOTE — Telephone Encounter (Signed)
PT. MESSAGE WAS FORWARDED TO DR.MOHAMED'S NURSE, O'Brien

## 2014-11-20 NOTE — Telephone Encounter (Signed)
Notified pt to f/u as scheduled. No further concerns

## 2014-12-07 ENCOUNTER — Ambulatory Visit (INDEPENDENT_AMBULATORY_CARE_PROVIDER_SITE_OTHER): Payer: Medicare Other | Admitting: Emergency Medicine

## 2014-12-07 ENCOUNTER — Encounter: Payer: Self-pay | Admitting: Emergency Medicine

## 2014-12-07 VITALS — BP 132/82 | HR 85 | Ht 65.0 in | Wt 257.0 lb

## 2014-12-07 DIAGNOSIS — R938 Abnormal findings on diagnostic imaging of other specified body structures: Secondary | ICD-10-CM | POA: Diagnosis not present

## 2014-12-07 DIAGNOSIS — R0602 Shortness of breath: Secondary | ICD-10-CM

## 2014-12-07 DIAGNOSIS — R9389 Abnormal findings on diagnostic imaging of other specified body structures: Secondary | ICD-10-CM

## 2014-12-07 NOTE — Patient Instructions (Signed)
We will perform full pulmonary function testing  Follow with Dr [name] on the same day as your PFT We will discuss starting a breathing medication at that visit.

## 2014-12-07 NOTE — Assessment & Plan Note (Signed)
With bilateral groundglass and some evidence for probable air trapping, bronchial malacia and tracheomalacia. She has some stable mediastinal adenopathy and has been followed with serial CTs. I suspect that this is air trapping but must also consider possible groundglass infiltrate particularly given her mediastinal findings. Depending on her pulmonary function testing and the status of her peripheral traits on next CT scan we may consider further workup including bronchoscopy, eosinophil count, biopsy

## 2014-12-07 NOTE — Progress Notes (Signed)
Subjective:    Patient ID: Kristin Hood, female    DOB: 1941-02-17, 74 y.o.   MRN: [id number]  HPI 74 year old woman, former smoker (60 pack years), with history of hypertension, atrial fibrillation, cerebrovascular disease, and breast cancer treated in 2001 w mastectomy, no XRT. She has history of some mediastinal lymphadenopathy and splenomegaly that has been followed by oncology in the aftermath of her breast cancer. She was just admitted to the hospital 10/2014 for . A CT scan of the chest was performed on 11/16/14 personally reviewed today. This showed no evidence of PE but some evidence for air trapping, tracheobronchomalacia, bronchitis. An echocardiogram was performed 8/19 that showed concentric left ventricular hypertrophy with an EF of 60-65%, moderately dilated left atrium, moderately dilated right atrium, mildly dilated right ventricle and an estimated PASP of 46 mmHg. She was treated with albuterol, was sent home w a script - seems to help her.  She sleeps on her L side with head on 2 pillows or in a recliner, does not really snore, feels well rested in the am. No witness apneas.    Review of Systems  Constitutional: Negative for fever and unexpected weight change.  HENT: Positive for congestion and postnasal drip. Negative for dental problem, ear pain, nosebleeds, rhinorrhea, sinus pressure, sneezing, sore throat and trouble swallowing.   Eyes: Negative for redness and itching.  Respiratory: Positive for shortness of breath. Negative for cough, chest tightness and wheezing.   Cardiovascular: Negative for palpitations and leg swelling.  Gastrointestinal: Negative for nausea and vomiting.  Genitourinary: Negative for dysuria.  Musculoskeletal: Negative for joint swelling.  Skin: Negative for rash.  Neurological: Negative for headaches.  Hematological: Does not bruise/bleed easily.  Psychiatric/Behavioral: Negative for dysphoric mood. The patient is not nervous/anxious.     Past  Medical History  Diagnosis Date  . Hypertension     , With mild concentric left ventricular hypertrophy  . Shingles   . Atrial fibrillation 07/2012    Eliquis, rate controlled  . Hx of adenomatous colonic polyps 2006    , Benign  . Mixed dyslipidemia   . Coarse tremors     , Essential  . Hypercholesteremia   . Breast cancer 2001    left mastectomy  . Complication of anesthesia     SMALLER TUBE FOR INTUBATION AS GAGS ON TUBE  . Dysrhythmia 08/20/2012    Atrial Fibrillation  . 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 CVA (cerebral vascular accident) 2013  . Valvular sclerosis 09/23/2003    without stenosis  . History of chemotherapy 11/13/1998 to 2010    Adriamycin/Docotaxol, Dexorubicin/Taxotere, Femara, Tamoxifen  . Heart murmur   . Thyroid nodule   . Tremor, essential      Family History  Problem Relation Age of Onset  . Heart disease Mother   . Heart disease Father   . Heart failure Father      Social History   Social History  . Marital Status: Married    Spouse Name: N/A  . Number of Children: N/A  . Years of Education: N/A   Occupational History  . retired    Social History Main Topics  . Smoking status: Former Smoker -- 2.00 packs/day for 30 years    Types: Cigarettes    Quit date: 01/30/1992  . Smokeless tobacco: Never Used  . Alcohol Use: 0.6 oz/week    1 Standard drinks or equivalent per week     Comment: wine occassionally  . Drug  Use: No  . Sexual Activity: Not on file   Other Topics Concern  . Not on file   Social History Narrative     Allergies  Allergen Reactions  . Floxin [Ofloxacin] Nausea Only and Other (See Comments)    Dizziness, Vertigo     Outpatient Prescriptions Prior to Visit  Medication Sig Dispense Refill  . albuterol (PROVENTIL HFA;VENTOLIN HFA) 108 (90 BASE) MCG/ACT inhaler Inhale 2 puffs into the lungs every 4 (four) hours as needed for wheezing or shortness of breath. 1 Inhaler 1  . amLODipine (NORVASC)  5 MG tablet Take 5 mg by mouth daily.    Marland Kitchen amoxicillin (AMOXIL) 500 MG capsule Take 2,000 mg by mouth once. 1 hour prior to dental appointments    . atorvastatin (LIPITOR) 10 MG tablet Take 10 mg by mouth daily.     Marland Kitchen ELIQUIS 5 MG TABS tablet Take 1 tablet by mouth two  times daily 180 tablet 1  . fenofibrate 160 MG tablet Take 160 mg by mouth daily.     . ferrous sulfate 325 (65 FE) MG EC tablet Take 325 mg by mouth every evening.    Marland Kitchen HYDROcodone-acetaminophen (NORCO/VICODIN) 5-325 MG per tablet Take 1-2 tablets by mouth every 6 (six) hours as needed for moderate pain.   0  . losartan (COZAAR) 100 MG tablet Take 100 mg by mouth daily.    . Multiple Vitamin (MULTIVITAMIN WITH MINERALS) TABS tablet Take 1 tablet by mouth daily.    . Omega-3 Fatty Acids (FISH OIL PO) Take 2 capsules by mouth 2 (two) times daily.    . propranolol (INDERAL) 60 MG tablet Take 2 tablets in the morning, 1 in the afternoon, and 1 in the evening. (Patient taking differently: Take 60-120 mg by mouth 3 (three) times daily. Take 2 tablets in the morning, 1 in the afternoon, and 1 in the evening.) 360 tablet 3   No facility-administered medications prior to visit.                                               Objective:   Physical Exam Filed Vitals:   12/07/14 1158  BP: 132/82  Pulse: 85  Height: 5\' 5"  (1.651 m)  Weight: 257 lb (116.574 kg)  SpO2: 98%   Gen: Pleasant, well-nourished, in no distress,  normal affect  ENT: No lesions,  mouth clear,  oropharynx clear, no postnasal drip  Neck: No JVD, no TMG, no carotid bruits  Lungs: No use of accessory muscles, no dullness to percussion, clear without rales or rhonchi  Cardiovascular: RRR, heart sounds normal, no murmur or gallops, no peripheral edema  Musculoskeletal: No deformities, no cyanosis or clubbing  Neuro: alert, non focal  Skin: Warm, no lesions or rashes     Assessment & Plan:  SOB (shortness of breath) I suspect her dyspnea is related to  obstructive lung disease given her significant improvement when treated with bronchodilators in the hospital. We will perform full pulmonary function testing to quantify her obstruction and then plan to start maintenance therapy. She did have some evidence for pulmonary hypertension on her echocardiogram which I suspect is multifactorial. She does not have any symptoms consistent with obstructive sleep apnea on questioning today.   Abnormal chest CT With bilateral groundglass and some evidence for probable air trapping, bronchial malacia and tracheomalacia. She has some stable  mediastinal adenopathy and has been followed with serial CTs. I suspect that this is air trapping but must also consider possible groundglass infiltrate particularly given her mediastinal findings. Depending on her pulmonary function testing and the status of her peripheral traits on next CT scan we may consider further workup including bronchoscopy, eosinophil count, biopsy

## 2014-12-07 NOTE — Assessment & Plan Note (Signed)
I suspect her dyspnea is related to obstructive lung disease given her significant improvement when treated with bronchodilators in the hospital. We will perform full pulmonary function testing to quantify her obstruction and then plan to start maintenance therapy. She did have some evidence for pulmonary hypertension on her echocardiogram which I suspect is multifactorial. She does not have any symptoms consistent with obstructive sleep apnea on questioning today.

## 2014-12-11 ENCOUNTER — Ambulatory Visit (INDEPENDENT_AMBULATORY_CARE_PROVIDER_SITE_OTHER): Payer: Medicare Other | Admitting: Internal Medicine

## 2014-12-11 ENCOUNTER — Encounter: Payer: Self-pay | Admitting: Internal Medicine

## 2014-12-11 ENCOUNTER — Other Ambulatory Visit (INDEPENDENT_AMBULATORY_CARE_PROVIDER_SITE_OTHER): Payer: Medicare Other

## 2014-12-11 VITALS — BP 128/82 | HR 95 | Temp 97.4°F | Resp 16 | Ht 65.0 in | Wt 258.8 lb

## 2014-12-11 DIAGNOSIS — E041 Nontoxic single thyroid nodule: Secondary | ICD-10-CM

## 2014-12-11 LAB — TSH: TSH: 4.02 u[IU]/mL (ref 0.35–4.50)

## 2014-12-11 LAB — T4, FREE: Free T4: 1.13 ng/dL (ref 0.60–1.60)

## 2014-12-11 LAB — T3, FREE: T3, Free: 3.1 pg/mL (ref 2.3–4.2)

## 2014-12-11 NOTE — Patient Instructions (Addendum)
We will refer you to Dr. Harlow Asa (Surgery).  After the surgery, please start Levothyroxine.Take the thyroid hormone every day, with water, >30 minutes before breakfast, separated by >4 hours from acid reflux medications, calcium, iron, multivitamins.  Schedule another app in ~ 6 weeks after the surgery.

## 2014-12-11 NOTE — Progress Notes (Signed)
Patient ID: Kristin Hood, female   DOB: March 02, 1941, 74 y.o.   MRN: [id number]   HPI  Kristin Hood is a 74 y.o.-year-old femalemale, returning for f a R thyroid nodule.  Since last visit, she had a THR and is now undergoing pulmonary eval. For her SOB. She will see Dr Glynda Jaeger 12/28/2014. It is believed to be restrictive,  Reviewed hx: She described having L-sided chest pressure (11/11/2013) >> went to the ED: found to be in A fib. Also had a Chest CT: Mediastinal and retroperitoneal lymph nodes, so a PET scan was recommended: a thyroid nodule appeared hypermetabolic.   PET scan (19/62/2297): hypermetabolic R thyroid nodule  Thyroid U/S (11/21/2013): R 2.7 x 1.8 cm nodule, heterogeneous, with coarse calcifications  R nodule Bx (12/01/2013): FLUS  11/30/2013 TSH: 6.84 (0.34-4.5).  R nodule Bx (07/11/2014): FLUS  07/31/2014: Called and d/w pt about the results of the Afirma test, which is "suspicious for neoplasm" (test results scanned under Media tab). This carries a risk of 75% for cancer. In this case, I suggested total thyroidectomy. Patient agreed with the plan, but wanted to wait until this appointment, since she had a total knee surgery in 09/2014.   Pt denies feeling nodules in neck, hoarseness, dysphagia/odynophagia, SOB with lying down.  Pt c/o: - + tremors (hereditary) - since a teenager - no heat intolerance/cold intolerance - no palpitations - no anxiety/no depression - no hyperdefecation/no constipation - no weight loss/no gain - no dry skin - no hair falling - no fatigue  Pt does have a FH of thyroid ds.: mother (hypothyroidism). No FH of thyroid cancer. No h/o radiation tx to head or neck. She had ChTx for her BrCA - dx 2000.  No seaweed or kelp, no recent contrast studies. No steroid use. No herbal supplements.   ROS: Constitutional: + see HPI Eyes: no blurry vision, no xerophthalmia ENT: no sore throat, no nodules palpated in throat, no dysphagia/odynophagia,  no hoarseness Cardiovascular: no CP/+ exertional SOB/no palpitations/leg swelling Respiratory: no cough/+ exertional SOB Gastrointestinal: no N/V/D/C Musculoskeletal: no muscle/joint aches Skin: no rashes Neurological: + tremors/no numbness/tingling/dizziness  I reviewed pt's medications, allergies, PMH, social hx, family hx, and changes were documented in the history of present illness. Otherwise, unchanged from my initial visit note.  Past Medical History  Diagnosis Date  . Hypertension     , With mild concentric left ventricular hypertrophy  . Shingles   . Atrial fibrillation 07/2012    Eliquis, rate controlled  . Hx of adenomatous colonic polyps 2006    , Benign  . Mixed dyslipidemia   . Coarse tremors     , Essential  . Hypercholesteremia   . Breast cancer 2001    left mastectomy  . Complication of anesthesia     SMALLER TUBE FOR INTUBATION AS GAGS ON TUBE  . Dysrhythmia 08/20/2012    Atrial Fibrillation  . 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 CVA (cerebral vascular accident) 2013  . Valvular sclerosis 09/23/2003    without stenosis  . History of chemotherapy 11/13/1998 to 2010    Adriamycin/Docotaxol, Dexorubicin/Taxotere, Femara, Tamoxifen  . Heart murmur   . Thyroid nodule   . Tremor, essential    Past Surgical History  Procedure Laterality Date  . Tee without cardioversion  11/19/2011    Procedure: TRANSESOPHAGEAL ECHOCARDIOGRAM (TEE);  Surgeon: [name], MD;  Location: [facility] ENDOSCOPY;  Service: Cardiovascular;  Laterality: N/A;  . Knee surgery  2009    ,  Total knee replacement  . Bunionectomy  11/29/1988    bilateral with hammer toes  . Left rotator cuff    . Cesarean section    . Left heart catheterization with coronary angiogram N/A 08/14/2011    Procedure: LEFT HEART CATHETERIZATION WITH CORONARY ANGIOGRAM;  Surgeon: [name], MD;  Location: [facility] CATH LAB;  Service: Cardiovascular;  Laterality: N/A;  . Eye surgery  01/31/2013    eye lid   . Appendectomy  07/10/1957  . Cesarean section  05/1968, 07/1965  . Mastectomy  10/19/1998    Radical, left -with lymph nodes (8 total)  . Reverse bunionectomy  08/29/1993    removal of Tibial Sesamoid-left  . Joint replacement  05/2001    left knee  . Joint replacement  11/2001    right knee  . Joint replacement  01/2008    redo right knee  . Biopsy thyroid  12/01/2013    ultrasound  . Cardiac catheterization  07/2010    , Without significant CAD  . Cardiac catheterization  07/2011    [name]  . Total hip arthroplasty Left 10/11/2014    Procedure: LEFT TOTAL HIP ARTHROPLASTY ANTERIOR APPROACH;  Surgeon: [name], MD;  Location: [facility] ORS;  Service: Orthopedics;  Laterality: Left;   History   Social History  . Marital Status: Married    Spouse Name: N/A    Number of Children: 2   Occupational History  . homemaker   Social History Main Topics  . Smoking status: Former Smoker -- 2.50 packs/day for 13 years    Types: Cigarettes    Quit date: 01/30/1992  . Smokeless tobacco: Never Used  . Alcohol Use:     3 drink(s) per week  . Drug Use: No   Current Outpatient Prescriptions on File Prior to Visit  Medication Sig Dispense Refill  . albuterol (PROVENTIL HFA;VENTOLIN HFA) 108 (90 BASE) MCG/ACT inhaler Inhale 2 puffs into the lungs every 4 (four) hours as needed for wheezing or shortness of breath. 1 Inhaler 1  . amLODipine (NORVASC) 5 MG tablet Take 5 mg by mouth daily.    Marland Kitchen amoxicillin (AMOXIL) 500 MG capsule Take 2,000 mg by mouth once. 1 hour prior to dental appointments    . atorvastatin (LIPITOR) 10 MG tablet Take 10 mg by mouth daily.     Marland Kitchen ELIQUIS 5 MG TABS tablet Take 1 tablet by mouth two  times daily 180 tablet 1  . fenofibrate 160 MG tablet Take 160 mg by mouth daily.     . ferrous sulfate 325 (65 FE) MG EC tablet Take 325 mg by mouth every evening.    Marland Kitchen HYDROcodone-acetaminophen (NORCO/VICODIN) 5-325 MG per tablet Take 1-2 tablets by mouth every 6 (six)  hours as needed for moderate pain.   0  . losartan (COZAAR) 100 MG tablet Take 100 mg by mouth daily.    . Multiple Vitamin (MULTIVITAMIN WITH MINERALS) TABS tablet Take 1 tablet by mouth daily.    . Omega-3 Fatty Acids (FISH OIL PO) Take 2 capsules by mouth 2 (two) times daily.    . propranolol (INDERAL) 60 MG tablet Take 2 tablets in the morning, 1 in the afternoon, and 1 in the evening. (Patient taking differently: Take 60-120 mg by mouth 3 (three) times daily. Take 2 tablets in the morning, 1 in the afternoon, and 1 in the evening.) 360 tablet 3   No current facility-administered medications on file prior to visit.   Allergies  Allergen Reactions  .  Floxin [Ofloxacin] Nausea Only and Other (See Comments)    Dizziness, Vertigo   Family History  Problem Relation Age of Onset  . Heart disease Mother   . Heart disease Father   . Heart failure Father    PE: BP 128/82 mmHg  Pulse 95  Temp(Src) 97.4 F (36.3 C) (Oral)  Resp 16  Ht '5\' 5"'  (1.651 m)  Wt 258 lb 12.8 oz (117.391 kg)  BMI 43.07 kg/m2  SpO2 76% Wt Readings from Last 3 Encounters:  12/11/14 258 lb 12.8 oz (117.391 kg)  12/07/14 257 lb (116.574 kg)  11/16/14 258 lb 13.1 oz (117.4 kg)   Constitutional: obese, in NAD Eyes: PERRLA, EOMI, no exophthalmos ENT: moist mucous membranes, no thyromegaly, no nodule palpatedno cervical lymphadenopathy Cardiovascular: RRR, No MRG Respiratory: CTA B Gastrointestinal: abdomen soft, NT, ND, BS+ Musculoskeletal: no deformities, strength intact in all 4;  Skin: moist, warm, no rashes Neurological: + tremor with outstretched hands, DTR 0/4 in B knees (previous sx)  ASSESSMENT: 1. R thyroid nodule, hypermetabolic on PET, FLUS on Bx  PLAN: 1. Thyroid nodule  - I reviewed the reports and results of her thyroid ultrasound + Bx + Affirma along with the patient and her husband. I pointed out that the dominant nodule is large, this being a risk factor for cancer. It appears to have  calcifications. It also appeared hypermetabolic on PET. Bx'es x 2: FLUS. Affirma molecular marker test was +. I recommended surgery again >> She now agrees with the plan.  - I explained that she will start LT4 after the surger >> discussed how to take it correctly: Take the thyroid hormone every day, with water, >30 minutes before breakfast, separated by >4 hours from acid reflux medications, calcium, iron, multivitamins. - I'll see her back in 1.5 mo after the Sx - will check TFTs now, before the surgery  Appointment on 12/11/2014  Component Date Value Ref Range Status  . TSH 12/11/2014 4.02  0.35 - 4.50 uIU/mL Final  . Free T4 12/11/2014 1.13  0.60 - 1.60 ng/dL Final  . T3, Free 12/11/2014 3.1  2.3 - 4.2 pg/mL Final   TFTs are normal.

## 2014-12-25 ENCOUNTER — Ambulatory Visit: Payer: Self-pay | Admitting: Surgery

## 2014-12-28 ENCOUNTER — Encounter: Payer: Self-pay | Admitting: Emergency Medicine

## 2014-12-28 ENCOUNTER — Ambulatory Visit (INDEPENDENT_AMBULATORY_CARE_PROVIDER_SITE_OTHER): Payer: Medicare Other | Admitting: Emergency Medicine

## 2014-12-28 ENCOUNTER — Ambulatory Visit (HOSPITAL_COMMUNITY)
Admission: RE | Admit: 2014-12-28 | Discharge: 2014-12-28 | Disposition: A | Discharge status: Home / Self Care | Payer: Medicare Other | Source: Ambulatory Visit | Attending: Emergency Medicine | Admitting: Emergency Medicine

## 2014-12-28 VITALS — BP 124/82 | HR 81 | Ht 65.0 in | Wt 256.0 lb

## 2014-12-28 DIAGNOSIS — R0602 Shortness of breath: Secondary | ICD-10-CM | POA: Diagnosis present

## 2014-12-28 DIAGNOSIS — R9389 Abnormal findings on diagnostic imaging of other specified body structures: Secondary | ICD-10-CM

## 2014-12-28 DIAGNOSIS — R938 Abnormal findings on diagnostic imaging of other specified body structures: Secondary | ICD-10-CM

## 2014-12-28 DIAGNOSIS — J449 Chronic obstructive pulmonary disease, unspecified: Secondary | ICD-10-CM

## 2014-12-28 LAB — PULMONARY FUNCTION TEST
DL/VA % pred: 88 %
DL/VA: 4.34 ml/min/mmHg/L
DLCO unc % pred: 61 %
DLCO unc: 15.66 ml/min/mmHg
FEF 25-75 Post: 1.96 L/sec
FEF 25-75 Pre: 1.26 L/sec
FEF2575-%CHANGE-POST: 54 %
FEF2575-%PRED-POST: 115 %
FEF2575-%Pred-Pre: 74 %
FEV1-%CHANGE-POST: 9 %
FEV1-%PRED-POST: 75 %
FEV1-%Pred-Pre: 68 %
FEV1-POST: 1.64 L
FEV1-PRE: 1.5 L
FEV1FVC-%CHANGE-POST: 5 %
FEV1FVC-%Pred-Pre: 101 %
FEV6-%Change-Post: 5 %
FEV6-%PRED-PRE: 69 %
FEV6-%Pred-Post: 73 %
FEV6-PRE: 1.94 L
FEV6-Post: 2.03 L
FEV6FVC-%Change-Post: 1 %
FEV6FVC-%PRED-PRE: 103 %
FEV6FVC-%Pred-Post: 105 %
FVC-%CHANGE-POST: 3 %
FVC-%PRED-POST: 70 %
FVC-%PRED-PRE: 67 %
FVC-POST: 2.05 L
FVC-PRE: 1.97 L
POST FEV6/FVC RATIO: 100 %
PRE FEV6/FVC RATIO: 98 %
Post FEV1/FVC ratio: 80 %
Pre FEV1/FVC ratio: 76 %
RV % PRED: 95 %
RV: 2.25 L
TLC % pred: 86 %
TLC: 4.48 L

## 2014-12-28 MED ORDER — ALBUTEROL SULFATE (2.5 MG/3ML) 0.083% IN NEBU
2.5000 mg | INHALATION_SOLUTION | Freq: Once | RESPIRATORY_TRACT | Status: AC
  Administered 2014-12-28: 2.5 mg via RESPIRATORY_TRACT

## 2014-12-28 MED ORDER — TIOTROPIUM BROMIDE MONOHYDRATE 18 MCG IN CAPS: 18.0000 ug | ORAL_CAPSULE | Freq: Every day | RESPIRATORY_TRACT | Status: DC

## 2014-12-28 NOTE — Patient Instructions (Signed)
We will try starting Spiriva Respimat, 2 puffs once a day to see if you benefit Continue to take albuterol 2 puffs up to every 4 hours if needed for shortness of breath.  Work on [employer], exercise and weight loss.  Flu shot up to date.  Follow with Dr [name] in 1 month

## 2014-12-28 NOTE — Assessment & Plan Note (Signed)
Multifactorial, consistent with her pulmonary function testing. I suspect she has some degree of COPD and also restriction. We discussed exercise, weight loss and cardiomegaly pulmonary conditioning today. Also like to try her on Spiriva to see if she benefits based on her obstructive lung disease on spirometry.

## 2014-12-28 NOTE — Assessment & Plan Note (Signed)
New diagnosis. We will do a trial of Spiriva as above

## 2014-12-28 NOTE — Assessment & Plan Note (Signed)
With mediastinal lymphadenopathy and some possible evidence for air trapping versus groundglass. Given her obstructive disease identified on spirometry I suspect that this is air trapping. I would like try her on a bronchodilator before she has any further imaging to see if there is improvement.

## 2014-12-28 NOTE — Progress Notes (Signed)
Subjective:    Patient ID: Kristin Hood, female    DOB: 03-Jul-1940, 74 y.o.   MRN: [id number]  Shortness of Breath Pertinent negatives include no ear pain, fever, headaches, leg swelling, rash, rhinorrhea, sore throat, vomiting or wheezing.   74 year old woman, former smoker (60 pack years), with history of hypertension, atrial fibrillation, cerebrovascular disease, and breast cancer treated in 2001 w mastectomy, no XRT. She has history of some mediastinal lymphadenopathy and splenomegaly that has been followed by oncology in the aftermath of her breast cancer. She was just admitted to the hospital 10/2014 for . A CT scan of the chest was performed on 11/16/14 personally reviewed today. This showed no evidence of PE but some evidence for air trapping, tracheobronchomalacia, bronchitis. An echocardiogram was performed 8/19 that showed concentric left ventricular hypertrophy with an EF of 60-65%, moderately dilated left atrium, moderately dilated right atrium, mildly dilated right ventricle and an estimated PASP of 46 mmHg. She was treated with albuterol, was sent home w a script - seems to help her.  She sleeps on her L side with head on 2 pillows or in a recliner, does not really snore, feels well rested in the am. No witness apneas.   ROV 12/28/14 -- follow-up visit for dyspnea with a history of tobacco abuse, treated breast cancer with serial CT scans show mediastinal lymphadenopathy and splenomegaly that has been stable by serial CT scans. She also has some subtle evidence of groundglass versus air trapping on her most recent CT. She underwent full pulmonary function testing today that I have personally reviewed. This showed mixed obstruction and restriction with a normal TLC and normal diffusion capacity.  She has been using albuterol nebulizer prn, feels that it does help her when she exerts.    Review of Systems  Constitutional: Negative for fever and unexpected weight change.  HENT: Positive  for congestion and postnasal drip. Negative for dental problem, ear pain, nosebleeds, rhinorrhea, sinus pressure, sneezing, sore throat and trouble swallowing.   Eyes: Negative for redness and itching.  Respiratory: Positive for shortness of breath. Negative for cough, chest tightness and wheezing.   Cardiovascular: Negative for palpitations and leg swelling.  Gastrointestinal: Negative for nausea and vomiting.  Genitourinary: Negative for dysuria.  Musculoskeletal: Negative for joint swelling.  Skin: Negative for rash.  Neurological: Negative for headaches.  Hematological: Does not bruise/bleed easily.  Psychiatric/Behavioral: Negative for dysphoric mood. The patient is not nervous/anxious.        Objective:   Physical Exam Filed Vitals:   12/28/14 1410  BP: 124/82  Pulse: 81  Height: 5\' 5"  (1.651 m)  Weight: 256 lb (116.121 kg)  SpO2: 96%   Gen: Pleasant, well-nourished, in no distress,  normal affect  ENT: No lesions,  mouth clear,  oropharynx clear, no postnasal drip  Neck: No JVD, no TMG, no carotid bruits  Lungs: No use of accessory muscles, no dullness to percussion, clear without rales or rhonchi  Cardiovascular: RRR, heart sounds normal, no murmur or gallops, no peripheral edema  Musculoskeletal: No deformities, no cyanosis or clubbing  Neuro: alert, non focal  Skin: Warm, no lesions or rashes     Assessment & Plan:  Abnormal chest CT With mediastinal lymphadenopathy and some possible evidence for air trapping versus groundglass. Given her obstructive disease identified on spirometry I suspect that this is air trapping. I would like try her on a bronchodilator before she has any further imaging to see if there is improvement.   SOB (  shortness of breath) Multifactorial, consistent with her pulmonary function testing. I suspect she has some degree of COPD and also restriction. We discussed exercise, weight loss and cardiomegaly pulmonary conditioning today. Also  like to try her on Spiriva to see if she benefits based on her obstructive lung disease on spirometry.   COPD (chronic obstructive pulmonary disease) New diagnosis. We will do a trial of Spiriva as above

## 2015-01-09 ENCOUNTER — Telehealth: Payer: Self-pay | Admitting: Emergency Medicine

## 2015-01-09 NOTE — Telephone Encounter (Signed)
Attempted to call office back, office currently closed, [facility] tomorrow

## 2015-01-10 NOTE — Telephone Encounter (Signed)
Received call from [facility] at Dr. Lynford Humphrey office, Claiborne Billings wanted to know if Surgical Clearance form has been completed for patient.  Claiborne Billings states that she faxed the request for surgical clearance to Dr. [name] on 9/24.  She is still awaiting a response.  Spoke with LL - she states that she has not received a fax.   Called Kelly back and advised her that LL has not received fax and she will need to re-fax.  To LL for follow up

## 2015-01-10 NOTE — Telephone Encounter (Signed)
Form has been placed in RB's look at. Will have him address when he returns to the office.

## 2015-01-10 NOTE — Telephone Encounter (Signed)
Received clearance off fax and placed in [place] folder up front to be passed out at end of today

## 2015-01-12 ENCOUNTER — Encounter: Payer: Self-pay | Admitting: *Deleted

## 2015-01-12 NOTE — Telephone Encounter (Signed)
Surgical clearance letter has been written and will be faxed. Nothing further is needed.

## 2015-01-30 ENCOUNTER — Encounter: Payer: Self-pay | Admitting: Emergency Medicine

## 2015-01-30 ENCOUNTER — Ambulatory Visit (INDEPENDENT_AMBULATORY_CARE_PROVIDER_SITE_OTHER): Payer: Medicare Other | Admitting: Emergency Medicine

## 2015-01-30 VITALS — BP 122/80 | HR 80 | Wt 244.0 lb

## 2015-01-30 DIAGNOSIS — R9389 Abnormal findings on diagnostic imaging of other specified body structures: Secondary | ICD-10-CM

## 2015-01-30 DIAGNOSIS — R938 Abnormal findings on diagnostic imaging of other specified body structures: Secondary | ICD-10-CM

## 2015-01-30 DIAGNOSIS — J449 Chronic obstructive pulmonary disease, unspecified: Secondary | ICD-10-CM | POA: Diagnosis not present

## 2015-01-30 NOTE — Progress Notes (Signed)
Subjective:    Patient ID: Kristin Hood, female    DOB: December 25, 1940, 74 y.o.   MRN: [id number]  Shortness of Breath Pertinent negatives include no ear pain, fever, headaches, leg swelling, rash, rhinorrhea, sore throat, vomiting or wheezing.   74 year old woman, former smoker (60 pack years), with history of hypertension, atrial fibrillation, cerebrovascular disease, and breast cancer treated in 2001 w mastectomy, no XRT. She has history of some mediastinal lymphadenopathy and splenomegaly that has been followed by oncology in the aftermath of her breast cancer. She was just admitted to the hospital 10/2014 for . A CT scan of the chest was performed on 11/16/14 personally reviewed today. This showed no evidence of PE but some evidence for air trapping, tracheobronchomalacia, bronchitis. An echocardiogram was performed 8/19 that showed concentric left ventricular hypertrophy with an EF of 60-65%, moderately dilated left atrium, moderately dilated right atrium, mildly dilated right ventricle and an estimated PASP of 46 mmHg. She was treated with albuterol, was sent home w a script - seems to help her.  She sleeps on her L side with head on 2 pillows or in a recliner, does not really snore, feels well rested in the am. No witness apneas.   ROV 12/28/14 -- follow-up visit for dyspnea with a history of tobacco abuse, treated breast cancer with serial CT scans show mediastinal lymphadenopathy and splenomegaly that has been stable by serial CT scans. She also has some subtle evidence of groundglass versus air trapping on her most recent CT. She underwent full pulmonary function testing today that I have personally reviewed. This showed mixed obstruction and restriction with a normal TLC and normal diffusion capacity.  She has been using albuterol nebulizer prn, feels that it does help her when she exerts.   ROV 01/30/15 -- follow-up visit for COPD and some evidence for air trapping on an abnormal CT scan of the  chest that was performed 10/2014. At her last visit we started Spiriva - she could tell an improvement in her breathing. Unfortunately the cost was very high, > $250 for her part.    Review of Systems  Constitutional: Negative for fever and unexpected weight change.  HENT: Positive for congestion and postnasal drip. Negative for dental problem, ear pain, nosebleeds, rhinorrhea, sinus pressure, sneezing, sore throat and trouble swallowing.   Eyes: Negative for redness and itching.  Respiratory: Positive for shortness of breath. Negative for cough, chest tightness and wheezing.   Cardiovascular: Negative for palpitations and leg swelling.  Gastrointestinal: Negative for nausea and vomiting.  Genitourinary: Negative for dysuria.  Musculoskeletal: Negative for joint swelling.  Skin: Negative for rash.  Neurological: Negative for headaches.  Hematological: Does not bruise/bleed easily.  Psychiatric/Behavioral: Negative for dysphoric mood. The patient is not nervous/anxious.        Objective:   Physical Exam Filed Vitals:   01/30/15 1626  BP: 122/80  Pulse: 80  Weight: 244 lb (110.678 kg)  SpO2: 96%   Gen: Pleasant, well-nourished, in no distress,  normal affect  ENT: No lesions,  mouth clear,  oropharynx clear, no postnasal drip  Neck: No JVD, no TMG, no carotid bruits  Lungs: No use of accessory muscles, no dullness to percussion, clear without rales or rhonchi  Cardiovascular: RRR, heart sounds normal, no murmur or gallops, no peripheral edema  Musculoskeletal: No deformities, no cyanosis or clubbing  Neuro: alert, non focal  Skin: Warm, no lesions or rashes     Assessment & Plan:  COPD (chronic obstructive pulmonary  disease) We will stop Spiriva given its cost and lack of insurance coverage. We will substitute Incruse one inhalation once a day. If you tolerate this medicine and benefit from it and please call our office so that we can call in a prescription for you.    Continue to have albuterol nebulizer available to use if needed for shortness of breath.  We will not repeat her CT scan of the chest at this time. We will revisit this issue at a future visit depending on your breathing.  Follow with Dr [name] in 4 months or sooner if you have any problems.  Abnormal chest CT Air trapping versus groundglass noted on CT scan of the chest from August 2016. I suspect that this is air-trapping givien her obstruction on PFT. I'll defer repeat CT scan for now and wait to see how she responds to bronchodilators

## 2015-01-30 NOTE — Patient Instructions (Signed)
We will stop Spiriva given its cost and lack of insurance coverage. We will substitute Incruse one inhalation once a day. If you tolerate this medicine and benefit from it and please call our office so that we can call in a prescription for you.  Continue to have albuterol nebulizer available to use if needed for shortness of breath.  We will not repeat her CT scan of the chest at this time. We will revisit this issue at a future visit depending on your breathing.  Follow with Dr [name] in 4 months or sooner if you have any problems.

## 2015-01-30 NOTE — Assessment & Plan Note (Signed)
Air trapping versus groundglass noted on CT scan of the chest from August 2016. I suspect that this is air-trapping givien her obstruction on PFT. I'll defer repeat CT scan for now and wait to see how she responds to bronchodilators

## 2015-01-30 NOTE — Assessment & Plan Note (Signed)
We will stop Spiriva given its cost and lack of insurance coverage. We will substitute Incruse one inhalation once a day. If you tolerate this medicine and benefit from it and please call our office so that we can call in a prescription for you.  Continue to have albuterol nebulizer available to use if needed for shortness of breath.  We will not repeat her CT scan of the chest at this time. We will revisit this issue at a future visit depending on your breathing.  Follow with Dr [name] in 4 months or sooner if you have any problems.

## 2015-02-13 ENCOUNTER — Telehealth: Payer: Self-pay | Admitting: Emergency Medicine

## 2015-02-13 NOTE — Telephone Encounter (Signed)
Called and spoke with pt and informed that samples of Incruse will be left up front for pick up  Nothing further is needed

## 2015-02-26 ENCOUNTER — Encounter (HOSPITAL_COMMUNITY): Payer: Self-pay

## 2015-02-26 ENCOUNTER — Ambulatory Visit (HOSPITAL_COMMUNITY)
Admission: RE | Admit: 2015-02-26 | Discharge: 2015-02-26 | Disposition: A | Discharge status: Home / Self Care | Payer: Medicare Other | Source: Ambulatory Visit | Attending: Anesthesiology | Admitting: Anesthesiology

## 2015-02-26 ENCOUNTER — Encounter (HOSPITAL_COMMUNITY)
Admission: RE | Admit: 2015-02-26 | Discharge: 2015-02-26 | Disposition: A | Discharge status: Home / Self Care | Payer: Medicare Other | Source: Ambulatory Visit | Attending: Surgery | Admitting: Surgery

## 2015-02-26 DIAGNOSIS — R918 Other nonspecific abnormal finding of lung field: Secondary | ICD-10-CM | POA: Insufficient documentation

## 2015-02-26 DIAGNOSIS — I517 Cardiomegaly: Secondary | ICD-10-CM | POA: Diagnosis not present

## 2015-02-26 DIAGNOSIS — R0602 Shortness of breath: Secondary | ICD-10-CM | POA: Insufficient documentation

## 2015-02-26 DIAGNOSIS — R9389 Abnormal findings on diagnostic imaging of other specified body structures: Secondary | ICD-10-CM

## 2015-02-26 DIAGNOSIS — R938 Abnormal findings on diagnostic imaging of other specified body structures: Secondary | ICD-10-CM | POA: Diagnosis not present

## 2015-02-26 DIAGNOSIS — Z01818 Encounter for other preprocedural examination: Secondary | ICD-10-CM | POA: Diagnosis not present

## 2015-02-26 DIAGNOSIS — Z01812 Encounter for preprocedural laboratory examination: Secondary | ICD-10-CM | POA: Insufficient documentation

## 2015-02-26 HISTORY — DX: Asymptomatic varicose veins of unspecified lower extremity: I83.90

## 2015-02-26 HISTORY — DX: Family history of other specified conditions: Z84.89

## 2015-02-26 HISTORY — DX: Chronic obstructive pulmonary disease, unspecified: J44.9

## 2015-02-26 HISTORY — DX: Angina pectoris, unspecified: I20.9

## 2015-02-26 HISTORY — DX: Nevus, non-neoplastic: I78.1

## 2015-02-26 HISTORY — DX: Reserved for inherently not codable concepts without codable children: IMO0001

## 2015-02-26 HISTORY — DX: Unspecified hearing loss, unspecified ear: H91.90

## 2015-02-26 HISTORY — DX: Personal history of other diseases of the digestive system: Z87.19

## 2015-02-26 HISTORY — DX: Gastro-esophageal reflux disease without esophagitis: K21.9

## 2015-02-26 HISTORY — DX: Unspecified osteoarthritis, unspecified site: M19.90

## 2015-02-26 LAB — BASIC METABOLIC PANEL
ANION GAP: 8 (ref 5–15)
BUN: 21 mg/dL — ABNORMAL HIGH (ref 6–20)
CALCIUM: 9.9 mg/dL (ref 8.9–10.3)
CO2: 25 mmol/L (ref 22–32)
Chloride: 108 mmol/L (ref 101–111)
Creatinine, Ser: 0.78 mg/dL (ref 0.44–1.00)
GLUCOSE: 107 mg/dL — AB (ref 65–99)
POTASSIUM: 5.1 mmol/L (ref 3.5–5.1)
Sodium: 141 mmol/L (ref 135–145)

## 2015-02-26 LAB — CBC
HEMATOCRIT: 36.9 % (ref 36.0–46.0)
HEMOGLOBIN: 11.9 g/dL — AB (ref 12.0–15.0)
MCH: 28.5 pg (ref 26.0–34.0)
MCHC: 32.2 g/dL (ref 30.0–36.0)
MCV: 88.5 fL (ref 78.0–100.0)
Platelets: 272 10*3/uL (ref 150–400)
RBC: 4.17 MIL/uL (ref 3.87–5.11)
RDW: 15.1 % (ref 11.5–15.5)
WBC: 4 10*3/uL (ref 4.0–10.5)

## 2015-02-26 NOTE — Progress Notes (Signed)
Spoke with Dr Jerold Coombe; Anesthesia reviewed pts H&P,EKGs,ECHO and LOV note per pulmonary. No orders given. Anesthesia to see pt day of surgery.

## 2015-02-26 NOTE — Progress Notes (Addendum)
Clearance per Dr [name] per epic 01/09/2015 telephone note EKG/epic and on chart 11/16/2014 and 11/13/2014  LOV note per Dr [name] / pulmonary in epic and on chart 01/30/2015  ECHO/epic 11/17/2014  Stress test epic 08/06/2011

## 2015-02-26 NOTE — Patient Instructions (Signed)
Kristin Hood  02/26/2015   Your procedure is scheduled on: Friday March 02, 2015   Report to [facility] Main  Entrance take [place]  elevators to 3rd floor to  [place] at 8:00 AM.  Call this number if you have problems the morning of surgery [phone]   Remember: ONLY 1 PERSON MAY GO WITH YOU TO SHORT STAY TO GET  READY MORNING OF [place].  Do not eat food or drink liquids :After Midnight.     Take these medicines the morning of surgery with A SIP OF WATER: Amlodipine (Norvasc); Propranolol (Inderal); May use Albuterol Inhaler if needed; Incruse Inhaler  (Bring inhalers with you day of surgery)              STOP ELIQUIS AND ANY HERBAL MEDICATIONS 3 DAYS PRIOR TO SURGERY                               You may not have any metal on your body including hair pins and              piercings  Do not wear jewelry, make-up, lotions, powders or perfumes, deodorant             Do not wear nail polish.  Do not shave  48 hours prior to surgery.            Do not bring valuables to the hospital. [place].  Contacts, dentures or bridgework may not be worn into surgery.  Leave suitcase in the car. After surgery it may be brought to your room.   _____________________________________________________________________             [facility] - Preparing for Surgery Before surgery, you can play an important role.  Because skin is not sterile, your skin needs to be as free of germs as possible.  You can reduce the number of germs on your skin by washing with CHG (chlorahexidine gluconate) soap before surgery.  CHG is an antiseptic cleaner which kills germs and bonds with the skin to continue killing germs even after washing. Please DO NOT use if you have an allergy to CHG or antibacterial soaps.  If your skin becomes reddened/irritated stop using the CHG and inform your nurse when you arrive at Short Stay. Do  not shave (including legs and underarms) for at least 48 hours prior to the first CHG shower.  You may shave your face/neck. Please follow these instructions carefully:  1.  Shower with CHG Soap the night before surgery and the  morning of Surgery.  2.  If you choose to wash your hair, wash your hair first as usual with your  normal  shampoo.  3.  After you shampoo, rinse your hair and body thoroughly to remove the  shampoo.                           4.  Use CHG as you would any other liquid soap.  You can apply chg directly  to the skin and wash                       Gently with a  scrungie or clean washcloth.  5.  Apply the CHG Soap to your body ONLY FROM THE NECK DOWN.   Do not use on face/ open                           Wound or open sores. Avoid contact with eyes, ears mouth and genitals (private parts).                       Wash face,  Genitals (private parts) with your normal soap.             6.  Wash thoroughly, paying special attention to the area where your surgery  will be performed.  7.  Thoroughly rinse your body with warm water from the neck down.  8.  DO NOT shower/wash with your normal soap after using and rinsing off  the CHG Soap.                9.  Pat yourself dry with a clean towel.            10.  Wear clean pajamas.            11.  Place clean sheets on your bed the night of your first shower and do not  sleep with pets. Day of Surgery : Do not apply any lotions/deodorants the morning of surgery.  Please wear clean clothes to the hospital/surgery center.  FAILURE TO FOLLOW THESE INSTRUCTIONS MAY RESULT IN THE CANCELLATION OF YOUR SURGERY PATIENT SIGNATURE_________________________________  NURSE SIGNATURE__________________________________  ________________________________________________________________________

## 2015-03-01 ENCOUNTER — Encounter (HOSPITAL_COMMUNITY): Payer: Self-pay | Admitting: Surgery

## 2015-03-01 DIAGNOSIS — D440 Neoplasm of uncertain behavior of thyroid gland: Secondary | ICD-10-CM | POA: Diagnosis present

## 2015-03-01 NOTE — Consult Note (Signed)
General Surgery [facility]  Foye Deer Fotopoulos DOB: Nov 28, 1940 Married / Language: Undefined / Race: Undefined Female  History of Present Illness The patient is a 74 year old female who presents with a thyroid nodule. Patient referred by Dr. Philemon Kingdom for evaluation of suspicious thyroid nodule for probable total thyroidectomy. Patient was found incidentally on CT scan to have a thyroid nodule. She subsequently underwent thyroid ultrasound demonstrating a 2.7 cm heterogeneous nodule in the right thyroid lobe with coarse calcifications. Fine-needle aspiration biopsy in September 123456 showed a follicular lesion of undetermined significance. Patient had a repeat biopsy in order to undergo [facility] testing. This was positive giving her a markedly increased risk of malignancy. Also the patient had a PET scan in August 2015 which showed a hypermetabolic right thyroid nodule. Patient has had no prior history of head or neck surgery. She is not on thyroid hormone. Patient does have a family history of thyroid disease in her mother who took medication. There is no family history of thyroid malignancy. There is no family history of other endocrine neoplasms. Patient does note a tremor which is worse since her cerebrovascular accident. She has atrial fibrillation. She is on chronic anticoagulation with Eliquis. Patient is followed by cardiology and is currently under evaluation by pulmonary medicine for restrictive lung disease. Patient is also followed by medical oncology for lymphadenopathy suspicious for underlying malignancy. Patient is having further testing later this week for her pulmonary evaluation.  Other Problems Atrial Fibrillation Breast Cancer Cerebrovascular Accident Heart murmur High blood pressure Hypercholesterolemia  Past Surgical History Appendectomy Breast Mass; Local Excision Left. Cataract Surgery Bilateral. Cesarean Section -  Multiple Foot Surgery Bilateral. Hip Surgery Left. Knee Surgery Bilateral. Mastectomy Left. Shoulder Surgery Left. Tonsillectomy  Diagnostic Studies History Colonoscopy 1-5 years ago Mammogram within last year Pap Smear >5 years ago  Allergies Floxin *FLUOROQUINOLONES*  Medication History Ventolin HFA (108 (90 Base)MCG/ACT Aerosol Soln, Inhalation) Active. Eliquis (5MG  Tablet, Oral two times daily) Active. Propranolol HCl (60MG  Tablet, Oral four a day) Active. Losartan Potassium (100MG  Tablet, Oral) Active. AmLODIPine Besylate (5MG  Tablet, Oral) Active. Fenofibrate (160MG  Tablet, Oral) Active. Atorvastatin Calcium (10MG  Tablet, Oral) Active. Fish Oil (1000MG  Capsule, Oral four a day) Active. Iron (65MG  Tablet, Oral) Active. Multiple Vitamins (Oral daily) Active. Medications Reconciled  Social History Alcohol use Occasional alcohol use. Caffeine use Coffee. No drug use Tobacco use Former smoker.  Family History Arthritis Father. Heart Disease Father. Hypertension Mother.  Pregnancy / Birth History  Age at menarche 78 years. Age of menopause 29-50 Gravida 2 Maternal age 22-25 Para 2  Review of Systems  General Not Present- Appetite Loss, Chills, Fatigue, Fever, Night Sweats, Weight Gain and Weight Loss. Skin Present- Rash. Not Present- Change in Wart/Mole, Dryness, Hives, Jaundice, New Lesions, Non-Healing Wounds and Ulcer. HEENT Present- Seasonal Allergies and Wears glasses/contact lenses. Not Present- Earache, Hearing Loss, Hoarseness, Nose Bleed, Oral Ulcers, Ringing in the Ears, Sinus Pain, Sore Throat, Visual Disturbances and Yellow Eyes. Respiratory Present- Difficulty Breathing. Not Present- Bloody sputum, Chronic Cough, Snoring and Wheezing. Breast Not Present- Breast Mass, Breast Pain, Nipple Discharge and Skin Changes. Cardiovascular Present- Shortness of Breath. Not Present- Chest Pain, Difficulty Breathing Lying Down,  Leg Cramps, Palpitations, Rapid Heart Rate and Swelling of Extremities. Gastrointestinal Not Present- Abdominal Pain, Bloating, Bloody Stool, Change in Bowel Habits, Chronic diarrhea, Constipation, Difficulty Swallowing, Excessive gas, Gets full quickly at meals, Hemorrhoids, Indigestion, Nausea, Rectal Pain and Vomiting. Female Genitourinary Not Present- Frequency, Nocturia, Painful  Urination, Pelvic Pain and Urgency. Neurological Present- Tremor. Not Present- Decreased Memory, Fainting, Headaches, Numbness, Seizures, Tingling, Trouble walking and Weakness. Psychiatric Not Present- Anxiety, Bipolar, Change in Sleep Pattern, Depression, Fearful and Frequent crying. Endocrine Not Present- Cold Intolerance, Excessive Hunger, Hair Changes, Heat Intolerance, Hot flashes and New Diabetes. Hematology Not Present- Easy Bruising, Excessive bleeding, Gland problems, HIV and Persistent Infections.  Vitals Weight: 257 lb Height: 65in Body Surface Area: 2.31 m Body Mass Index: 42.77 kg/m  Temp.: 97.72F(Temporal)  Pulse: 81 (Regular)  BP: 126/76 (Sitting, Left Arm, Standard)  Physical Exam  General - appears comfortable, no distress; not diaphorectic  HEENT - normocephalic; sclerae clear, gaze conjugate; mucous membranes moist, dentition good; voice normal  Neck - symmetric on extension; no palpable anterior or posterior cervical adenopathy; no palpable masses in the thyroid bed  Chest - clear bilaterally without rhonchi, rales, or wheeze; patient appears mildly short of breath and mildly dyspneic  Cor - irregular rhythm with normal rate; no significant murmur  Ext - non-tender without significant edema or lymphedema  Neuro - grossly intact; moderate tremor   Assessment & Plan   NEOPLASM OF UNCERTAIN BEHAVIOR OF THYROID GLAND (D44.0)  MULTIPLE THYROID NODULES (E04.2)  Patient presents accompanied by her husband to discuss thyroid surgery. I provided them with written literature  on thyroid surgery to review at home.  Patient has a dominant nodule in the right thyroid lobe measuring 2.7 cm which is highly suspicious for malignancy. She has a small subcentimeter nodule in the left thyroid lobe. I have recommended total thyroidectomy for definitive diagnosis and management. The same recommendation has been made by the patient's endocrinologist.  We discussed total thyroidectomy at length. We reviewed the anatomy. We discussed potential complications including recurrent laryngeal nerve injury and injury to parathyroid glands. We discussed the hospital stay to be anticipated and the postoperative recovery. We discussed the potential need for radioactive iodine treatment. We discussed the need for lifelong thyroid hormone replacement. Patient and her husband understand and wish to proceed with surgery in the near future.  Patient will require clearance from medical specialist including cardiology and pulmonary medicine.  The risks and benefits of the procedure have been discussed at length with the patient. The patient understands the proposed procedure, potential alternative treatments, and the course of recovery to be expected. All of the patient's questions have been answered at this time. The patient wishes to proceed with surgery.  Earnstine Regal, MD, [facility] Office: [phone]

## 2015-03-02 ENCOUNTER — Observation Stay (HOSPITAL_COMMUNITY)
Admission: RE | Admit: 2015-03-02 | Discharge: 2015-03-03 | Disposition: A | Discharge status: Home / Self Care | Payer: Medicare Other | Source: Ambulatory Visit | Attending: Surgery | Admitting: Surgery

## 2015-03-02 ENCOUNTER — Ambulatory Visit (HOSPITAL_COMMUNITY): Payer: Medicare Other | Admitting: Certified Registered Nurse Anesthetist

## 2015-03-02 ENCOUNTER — Encounter (HOSPITAL_COMMUNITY): Payer: Self-pay | Admitting: *Deleted

## 2015-03-02 ENCOUNTER — Encounter (HOSPITAL_COMMUNITY)
Admission: RE | Disposition: A | Discharge status: Home / Self Care | Payer: Self-pay | Source: Ambulatory Visit | Attending: Surgery

## 2015-03-02 DIAGNOSIS — E7800 Pure hypercholesterolemia, unspecified: Secondary | ICD-10-CM | POA: Diagnosis not present

## 2015-03-02 DIAGNOSIS — Z79899 Other long term (current) drug therapy: Secondary | ICD-10-CM | POA: Insufficient documentation

## 2015-03-02 DIAGNOSIS — K449 Diaphragmatic hernia without obstruction or gangrene: Secondary | ICD-10-CM | POA: Insufficient documentation

## 2015-03-02 DIAGNOSIS — J449 Chronic obstructive pulmonary disease, unspecified: Secondary | ICD-10-CM | POA: Insufficient documentation

## 2015-03-02 DIAGNOSIS — K219 Gastro-esophageal reflux disease without esophagitis: Secondary | ICD-10-CM | POA: Diagnosis not present

## 2015-03-02 DIAGNOSIS — Z6841 Body Mass Index (BMI) 40.0 and over, adult: Secondary | ICD-10-CM | POA: Insufficient documentation

## 2015-03-02 DIAGNOSIS — E041 Nontoxic single thyroid nodule: Secondary | ICD-10-CM | POA: Insufficient documentation

## 2015-03-02 DIAGNOSIS — Z9842 Cataract extraction status, left eye: Secondary | ICD-10-CM | POA: Insufficient documentation

## 2015-03-02 DIAGNOSIS — I10 Essential (primary) hypertension: Secondary | ICD-10-CM | POA: Insufficient documentation

## 2015-03-02 DIAGNOSIS — Z87891 Personal history of nicotine dependence: Secondary | ICD-10-CM | POA: Diagnosis not present

## 2015-03-02 DIAGNOSIS — D34 Benign neoplasm of thyroid gland: Principal | ICD-10-CM | POA: Insufficient documentation

## 2015-03-02 DIAGNOSIS — Z9841 Cataract extraction status, right eye: Secondary | ICD-10-CM | POA: Insufficient documentation

## 2015-03-02 DIAGNOSIS — I4891 Unspecified atrial fibrillation: Secondary | ICD-10-CM | POA: Insufficient documentation

## 2015-03-02 DIAGNOSIS — M1990 Unspecified osteoarthritis, unspecified site: Secondary | ICD-10-CM | POA: Insufficient documentation

## 2015-03-02 DIAGNOSIS — E063 Autoimmune thyroiditis: Secondary | ICD-10-CM | POA: Diagnosis not present

## 2015-03-02 DIAGNOSIS — Z7901 Long term (current) use of anticoagulants: Secondary | ICD-10-CM | POA: Insufficient documentation

## 2015-03-02 DIAGNOSIS — D440 Neoplasm of uncertain behavior of thyroid gland: Secondary | ICD-10-CM | POA: Diagnosis present

## 2015-03-02 DIAGNOSIS — Z8673 Personal history of transient ischemic attack (TIA), and cerebral infarction without residual deficits: Secondary | ICD-10-CM | POA: Insufficient documentation

## 2015-03-02 HISTORY — PX: THYROIDECTOMY: SHX17

## 2015-03-02 LAB — PROTIME-INR
INR: 1.08 (ref 0.00–1.49)
PROTHROMBIN TIME: 14.2 s (ref 11.6–15.2)

## 2015-03-02 SURGERY — THYROIDECTOMY
Anesthesia: General | Site: Neck

## 2015-03-02 MED ORDER — NEOSTIGMINE METHYLSULFATE 10 MG/10ML IV SOLN
INTRAVENOUS | Status: DC | PRN
  Administered 2015-03-02: 4 mg via INTRAVENOUS

## 2015-03-02 MED ORDER — PROPRANOLOL HCL 60 MG PO TABS: 60.0000 mg | ORAL_TABLET | Freq: Three times a day (TID) | ORAL | Status: DC

## 2015-03-02 MED ORDER — CEFAZOLIN SODIUM-DEXTROSE 2-3 GM-% IV SOLR
INTRAVENOUS | Status: AC
  Filled 2015-03-02: qty 50

## 2015-03-02 MED ORDER — ONDANSETRON HCL 4 MG/2ML IJ SOLN
INTRAMUSCULAR | Status: AC
  Filled 2015-03-02: qty 2

## 2015-03-02 MED ORDER — ARTIFICIAL TEARS OP OINT
TOPICAL_OINTMENT | OPHTHALMIC | Status: AC
  Filled 2015-03-02: qty 3.5

## 2015-03-02 MED ORDER — DEXAMETHASONE SODIUM PHOSPHATE 10 MG/ML IJ SOLN
INTRAMUSCULAR | Status: DC | PRN
  Administered 2015-03-02: 10 mg via INTRAVENOUS

## 2015-03-02 MED ORDER — ONDANSETRON HCL 4 MG/2ML IJ SOLN
INTRAMUSCULAR | Status: DC | PRN
  Administered 2015-03-02: 4 mg via INTRAVENOUS

## 2015-03-02 MED ORDER — PHENYLEPHRINE 40 MCG/ML (10ML) SYRINGE FOR IV PUSH (FOR BLOOD PRESSURE SUPPORT)
PREFILLED_SYRINGE | INTRAVENOUS | Status: AC
  Filled 2015-03-02: qty 10

## 2015-03-02 MED ORDER — ROCURONIUM BROMIDE 100 MG/10ML IV SOLN
INTRAVENOUS | Status: DC | PRN
  Administered 2015-03-02: 30 mg via INTRAVENOUS

## 2015-03-02 MED ORDER — CEFAZOLIN SODIUM-DEXTROSE 2-3 GM-% IV SOLR
2.0000 g | INTRAVENOUS | Status: AC
  Administered 2015-03-02: 2 g via INTRAVENOUS

## 2015-03-02 MED ORDER — MIDAZOLAM HCL 2 MG/2ML IJ SOLN
INTRAMUSCULAR | Status: AC
  Filled 2015-03-02: qty 2

## 2015-03-02 MED ORDER — FENTANYL CITRATE (PF) 100 MCG/2ML IJ SOLN
INTRAMUSCULAR | Status: AC
  Filled 2015-03-02: qty 2

## 2015-03-02 MED ORDER — GLYCOPYRROLATE 0.2 MG/ML IJ SOLN
INTRAMUSCULAR | Status: DC | PRN
  Administered 2015-03-02: 0.4 mg via INTRAVENOUS

## 2015-03-02 MED ORDER — ALBUTEROL SULFATE (2.5 MG/3ML) 0.083% IN NEBU: 3.0000 mL | INHALATION_SOLUTION | RESPIRATORY_TRACT | Status: DC | PRN

## 2015-03-02 MED ORDER — KCL IN DEXTROSE-NACL 20-5-0.45 MEQ/L-%-% IV SOLN
INTRAVENOUS | Status: DC
  Administered 2015-03-02: 15:00:00 via INTRAVENOUS
  Filled 2015-03-02 (×2): qty 1000

## 2015-03-02 MED ORDER — ACETAMINOPHEN 325 MG PO TABS: 650.0000 mg | ORAL_TABLET | Freq: Four times a day (QID) | ORAL | Status: DC | PRN

## 2015-03-02 MED ORDER — AMLODIPINE BESYLATE 5 MG PO TABS
5.0000 mg | ORAL_TABLET | Freq: Every day | ORAL | Status: DC
  Filled 2015-03-02: qty 1

## 2015-03-02 MED ORDER — NEOSTIGMINE METHYLSULFATE 10 MG/10ML IV SOLN
INTRAVENOUS | Status: AC
Start: 2015-03-02 — End: 2015-03-02
  Filled 2015-03-02: qty 1

## 2015-03-02 MED ORDER — UMECLIDINIUM BROMIDE 62.5 MCG/INH IN AEPB: 1.0000 | INHALATION_SPRAY | Freq: Every day | RESPIRATORY_TRACT | Status: DC

## 2015-03-02 MED ORDER — PHENYLEPHRINE HCL 10 MG/ML IJ SOLN
INTRAMUSCULAR | Status: DC | PRN
  Administered 2015-03-02: 40 ug via INTRAVENOUS
  Administered 2015-03-02: 120 ug via INTRAVENOUS
  Administered 2015-03-02: 40 ug via INTRAVENOUS

## 2015-03-02 MED ORDER — FENTANYL CITRATE (PF) 100 MCG/2ML IJ SOLN
INTRAMUSCULAR | Status: DC | PRN
  Administered 2015-03-02: 50 ug via INTRAVENOUS
  Administered 2015-03-02: 100 ug via INTRAVENOUS
  Administered 2015-03-02 (×4): 50 ug via INTRAVENOUS

## 2015-03-02 MED ORDER — LIDOCAINE HCL (CARDIAC) 20 MG/ML IV SOLN
INTRAVENOUS | Status: DC | PRN
  Administered 2015-03-02: 60 mg via INTRAVENOUS

## 2015-03-02 MED ORDER — FENTANYL CITRATE (PF) 250 MCG/5ML IJ SOLN
INTRAMUSCULAR | Status: AC
  Filled 2015-03-02: qty 5

## 2015-03-02 MED ORDER — PROPRANOLOL HCL 60 MG PO TABS
120.0000 mg | ORAL_TABLET | Freq: Every day | ORAL | Status: DC
  Filled 2015-03-02: qty 2

## 2015-03-02 MED ORDER — 0.9 % SODIUM CHLORIDE (POUR BTL) OPTIME
TOPICAL | Status: DC | PRN
  Administered 2015-03-02: 1000 mL

## 2015-03-02 MED ORDER — ACETAMINOPHEN 650 MG RE SUPP: 650.0000 mg | Freq: Four times a day (QID) | RECTAL | Status: DC | PRN

## 2015-03-02 MED ORDER — PROPOFOL 10 MG/ML IV BOLUS
INTRAVENOUS | Status: DC | PRN
  Administered 2015-03-02: 100 mg via INTRAVENOUS

## 2015-03-02 MED ORDER — LACTATED RINGERS IV SOLN
INTRAVENOUS | Status: DC
  Administered 2015-03-02: 1000 mL via INTRAVENOUS

## 2015-03-02 MED ORDER — DEXAMETHASONE SODIUM PHOSPHATE 10 MG/ML IJ SOLN
INTRAMUSCULAR | Status: AC
  Filled 2015-03-02: qty 1

## 2015-03-02 MED ORDER — PROPOFOL 10 MG/ML IV BOLUS
INTRAVENOUS | Status: AC
  Filled 2015-03-02: qty 20

## 2015-03-02 MED ORDER — HYDROMORPHONE HCL 1 MG/ML IJ SOLN
1.0000 mg | INTRAMUSCULAR | Status: DC | PRN
  Administered 2015-03-02 (×2): 1 mg via INTRAVENOUS
  Filled 2015-03-02 (×2): qty 1

## 2015-03-02 MED ORDER — GLYCOPYRROLATE 0.2 MG/ML IJ SOLN
INTRAMUSCULAR | Status: AC
  Filled 2015-03-02: qty 5

## 2015-03-02 MED ORDER — PROPRANOLOL HCL 60 MG PO TABS
60.0000 mg | ORAL_TABLET | ORAL | Status: DC
  Administered 2015-03-02 (×2): 60 mg via ORAL
  Filled 2015-03-02 (×5): qty 1

## 2015-03-02 MED ORDER — LOSARTAN POTASSIUM 50 MG PO TABS
100.0000 mg | ORAL_TABLET | Freq: Every day | ORAL | Status: DC
  Administered 2015-03-02: 100 mg via ORAL
  Filled 2015-03-02 (×2): qty 2

## 2015-03-02 MED ORDER — SUCCINYLCHOLINE CHLORIDE 20 MG/ML IJ SOLN
INTRAMUSCULAR | Status: DC | PRN
  Administered 2015-03-02: 100 mg via INTRAVENOUS

## 2015-03-02 MED ORDER — MIDAZOLAM HCL 5 MG/5ML IJ SOLN
INTRAMUSCULAR | Status: DC | PRN
  Administered 2015-03-02: 2 mg via INTRAVENOUS

## 2015-03-02 MED ORDER — ONDANSETRON HCL 4 MG/2ML IJ SOLN: 4.0000 mg | Freq: Four times a day (QID) | INTRAMUSCULAR | Status: DC | PRN

## 2015-03-02 MED ORDER — HYDROCODONE-ACETAMINOPHEN 5-325 MG PO TABS
1.0000 | ORAL_TABLET | ORAL | Status: DC | PRN
  Administered 2015-03-02 – 2015-03-03 (×2): 2 via ORAL
  Filled 2015-03-02 (×2): qty 2

## 2015-03-02 MED ORDER — ONDANSETRON 4 MG PO TBDP: 4.0000 mg | ORAL_TABLET | Freq: Four times a day (QID) | ORAL | Status: DC | PRN

## 2015-03-02 MED ORDER — LIDOCAINE HCL (CARDIAC) 20 MG/ML IV SOLN
INTRAVENOUS | Status: AC
  Filled 2015-03-02: qty 5

## 2015-03-02 MED ORDER — CALCIUM CARBONATE 1250 (500 CA) MG PO TABS
2.0000 | ORAL_TABLET | Freq: Three times a day (TID) | ORAL | Status: DC
  Administered 2015-03-02 – 2015-03-03 (×2): 1000 mg via ORAL
  Filled 2015-03-02 (×6): qty 2

## 2015-03-02 MED ORDER — FENTANYL CITRATE (PF) 100 MCG/2ML IJ SOLN
25.0000 ug | INTRAMUSCULAR | Status: DC | PRN
  Administered 2015-03-02: 50 ug via INTRAVENOUS
  Administered 2015-03-02 (×2): 25 ug via INTRAVENOUS

## 2015-03-02 SURGICAL SUPPLY — 38 items
APL SKNCLS STERI-STRIP NONHPOA (GAUZE/BANDAGES/DRESSINGS) ×1
ATTRACTOMAT 16X20 MAGNETIC DRP (DRAPES) ×5 IMPLANT
BENZOIN TINCTURE PRP APPL 2/3 (GAUZE/BANDAGES/DRESSINGS) ×2 IMPLANT
BLADE HEX COATED 2.75 (ELECTRODE) ×3 IMPLANT
BLADE SURG 15 STRL LF DISP TIS (BLADE) ×1 IMPLANT
BLADE SURG 15 STRL SS (BLADE) ×3
CHLORAPREP W/TINT 26ML (MISCELLANEOUS) ×3 IMPLANT
CLIP TI MEDIUM 6 (CLIP) ×6 IMPLANT
CLIP TI WIDE RED SMALL 6 (CLIP) ×12 IMPLANT
CLOSURE WOUND 1/2 X4 (GAUZE/BANDAGES/DRESSINGS) ×1
COVER SURGICAL LIGHT HANDLE (MISCELLANEOUS) ×3 IMPLANT
DISSECTOR ROUND CHERRY 3/8 STR (MISCELLANEOUS) IMPLANT
DRAPE LAPAROTOMY T 98X78 PEDS (DRAPES) ×3 IMPLANT
DRESSING SURGICEL FIBRLLR 1X2 (HEMOSTASIS) ×1 IMPLANT
DRSG SURGICEL FIBRILLAR 1X2 (HEMOSTASIS) ×3
ELECT PENCIL ROCKER SW 15FT (MISCELLANEOUS) ×3 IMPLANT
ELECT REM PT RETURN 9FT ADLT (ELECTROSURGICAL) ×3
ELECTRODE REM PT RTRN 9FT ADLT (ELECTROSURGICAL) ×1 IMPLANT
GAUZE SPONGE 4X4 12PLY STRL (GAUZE/BANDAGES/DRESSINGS) IMPLANT
GAUZE SPONGE 4X4 16PLY XRAY LF (GAUZE/BANDAGES/DRESSINGS) ×3 IMPLANT
GLOVE SURG ORTHO 8.0 STRL STRW (GLOVE) ×3 IMPLANT
GOWN STRL REUS W/TWL XL LVL3 (GOWN DISPOSABLE) ×6 IMPLANT
KIT BASIN OR (CUSTOM PROCEDURE TRAY) ×3 IMPLANT
LIQUID BAND (GAUZE/BANDAGES/DRESSINGS) ×3 IMPLANT
PACK BASIC VI WITH GOWN DISP (CUSTOM PROCEDURE TRAY) ×3 IMPLANT
SHEARS HARMONIC 9CM CVD (BLADE) ×3 IMPLANT
STAPLER VISISTAT 35W (STAPLE) IMPLANT
STRIP CLOSURE SKIN 1/2X4 (GAUZE/BANDAGES/DRESSINGS) ×2 IMPLANT
SUT MNCRL AB 4-0 PS2 18 (SUTURE) ×3 IMPLANT
SUT SILK 2 0 (SUTURE)
SUT SILK 2-0 18XBRD TIE 12 (SUTURE) IMPLANT
SUT SILK 3 0 (SUTURE)
SUT SILK 3-0 18XBRD TIE 12 (SUTURE) IMPLANT
SUT VIC AB 3-0 SH 18 (SUTURE) ×6 IMPLANT
SYR BULB IRRIGATION 50ML (SYRINGE) ×3 IMPLANT
TOWEL OR 17X26 10 PK STRL BLUE (TOWEL DISPOSABLE) ×3 IMPLANT
TOWEL OR NON WOVEN STRL DISP B (DISPOSABLE) ×3 IMPLANT
YANKAUER SUCT BULB TIP 10FT TU (MISCELLANEOUS) ×3 IMPLANT

## 2015-03-02 NOTE — Op Note (Signed)
Procedure Note  Pre-operative Diagnosis:  Thyroid neoplasm of uncertain behavior  Post-operative Diagnosis:  same  Surgeon:  Earnstine Regal, MD, FACS  Assistant:  none   Procedure:  Total thyroidectomy  Anesthesia:  General  Estimated Blood Loss:  minimal  Drains: none         Specimen: thyroid to pathology  Indications:  The patient is a 74 year old female who presents with a thyroid nodule. Patient referred by Dr. Philemon Kingdom for evaluation of suspicious thyroid nodule for probable total thyroidectomy. Patient was found incidentally on CT scan to have a thyroid nodule. She subsequently underwent thyroid ultrasound demonstrating a 2.7 cm heterogeneous nodule in the right thyroid lobe with coarse calcifications. Fine-needle aspiration biopsy in September 123456 showed a follicular lesion of undetermined significance. Patient had a repeat biopsy in order to undergo [facility] testing. This was positive giving her a markedly increased risk of malignancy. Also the patient had a PET scan in August 2015 which showed a hypermetabolic right thyroid nodule.   Procedure Details: Procedure was done in OR #4 at the [facility].  The patient was brought to the operating room and placed in a supine position on the operating room table.  Following administration of general anesthesia, the patient was positioned and then prepped and draped in the usual aseptic fashion.  After ascertaining that an adequate level of anesthesia had been achieved, a Kocher incision was made with #15 blade.  Dissection was carried through subcutaneous tissues and platysma. Hemostasis was achieved with the electrocautery.  Skin flaps were elevated cephalad and caudad from the thyroid notch to the sternal notch.  The [place] self-retaining retractor was placed for exposure.  Strap muscles were incised in the midline and dissection was begun on the left side.  Strap muscles were reflected laterally.  Left thyroid lobe  was small without palpable nodules.  The left lobe was gently mobilized with blunt dissection.  Superior pole vessels were dissected out and divided individually between small and medium Ligaclips with the Harmonic scalpel.  The thyroid lobe was rolled anteriorly.  Branches of the inferior thyroid artery were divided between small Ligaclips with the Harmonic scalpel.  Inferior venous tributaries were divided between Ligaclips.  Both the superior and inferior parathyroid glands were identified and preserved on their vascular pedicles.  The recurrent laryngeal nerve was identified and preserved along its course.  The ligament of Gwenlyn Found was released with the electrocautery and the gland was mobilized onto the anterior trachea. Isthmus was mobilized across the midline.  There was a moderate sized pyramidal lobe present which was dissected of the anterior surface of the thyroid cartilage and resected with the isthmus.  Dry pack was placed in the left neck.  Next, the right thyroid lobe was gently mobilized with blunt dissection.  Right thyroid lobe was mildly enlarged with a dominant mass in the superior pole measuring approximately 2.5-3.0 cm in size.  Superior pole vessels were dissected out and divided between small and medium Ligaclips with the Harmonic scalpel.  Superior parathyroid was identified and preserved.  Inferior venous tributaries were divided between medium Ligaclips with the Harmonic scalpel.  The right thyroid lobe was rolled anteriorly and the branches of the inferior thyroid artery divided between small Ligaclips.  The right recurrent laryngeal nerve was identified and preserved along its course.  The ligament of Gwenlyn Found was released with the electrocautery.  The right thyroid lobe was mobilized onto the anterior trachea and the remainder of the thyroid  was dissected off the anterior trachea and the thyroid was completely excised.  A suture was used to mark the right lobe. The entire thyroid gland was  submitted to pathology for review.  The neck was irrigated with warm saline.  Fibrillar was placed throughout the operative field.  Strap muscles were reapproximated in the midline with interrupted 3-0 Vicryl sutures.  Platysma was closed with interrupted 3-0 Vicryl sutures.  Skin was closed with a running 4-0 Monocryl subcuticular suture.  Wound was washed and dried and benzoin and steri-strips were applied.  Dry gauze dressing was placed.  The patient was awakened from anesthesia and brought to the recovery room.  The patient tolerated the procedure well.   Earnstine Regal, MD, [facility] Office: [phone]

## 2015-03-02 NOTE — Anesthesia Procedure Notes (Signed)
Procedure Name: Intubation Date/Time: 03/02/2015 9:44 AM Performed by: Rogers Blocker Pre-anesthesia Checklist: Patient identified, Emergency Drugs available, Suction available, Patient being monitored and Timeout performed Patient Re-evaluated:Patient Re-evaluated prior to inductionOxygen Delivery Method: Circle system utilized Preoxygenation: Pre-oxygenation with 100% oxygen Intubation Type: IV induction Ventilation: Mask ventilation without difficulty Laryngoscope Size: Mac and 3 Grade View: Grade I Tube type: Oral Tube size: 7.5 mm Number of attempts: 1 Airway Equipment and Method: Stylet Placement Confirmation: ETT inserted through vocal cords under direct vision,  positive ETCO2,  CO2 detector and breath sounds checked- equal and bilateral Secured at: 21 cm Tube secured with: Tape Dental Injury: Teeth and Oropharynx as per pre-operative assessment

## 2015-03-02 NOTE — H&P (View-Only) (Signed)
General Surgery [facility]  Kristin Hood DOB: July 02, 1940 Married / Language: Undefined / Race: Undefined Female  History of Present Illness The patient is a 74 year old female who presents with a thyroid nodule. Patient referred by Dr. Philemon Kingdom for evaluation of suspicious thyroid nodule for probable total thyroidectomy. Patient was found incidentally on CT scan to have a thyroid nodule. She subsequently underwent thyroid ultrasound demonstrating a 2.7 cm heterogeneous nodule in the right thyroid lobe with coarse calcifications. Fine-needle aspiration biopsy in September 123456 showed a follicular lesion of undetermined significance. Patient had a repeat biopsy in order to undergo [facility] testing. This was positive giving her a markedly increased risk of malignancy. Also the patient had a PET scan in August 2015 which showed a hypermetabolic right thyroid nodule. Patient has had no prior history of head or neck surgery. She is not on thyroid hormone. Patient does have a family history of thyroid disease in her mother who took medication. There is no family history of thyroid malignancy. There is no family history of other endocrine neoplasms. Patient does note a tremor which is worse since her cerebrovascular accident. She has atrial fibrillation. She is on chronic anticoagulation with Eliquis. Patient is followed by cardiology and is currently under evaluation by pulmonary medicine for restrictive lung disease. Patient is also followed by medical oncology for lymphadenopathy suspicious for underlying malignancy. Patient is having further testing later this week for her pulmonary evaluation.  Other Problems Atrial Fibrillation Breast Cancer Cerebrovascular Accident Heart murmur High blood pressure Hypercholesterolemia  Past Surgical History Appendectomy Breast Mass; Local Excision Left. Cataract Surgery Bilateral. Cesarean Section -  Multiple Foot Surgery Bilateral. Hip Surgery Left. Knee Surgery Bilateral. Mastectomy Left. Shoulder Surgery Left. Tonsillectomy  Diagnostic Studies History Colonoscopy 1-5 years ago Mammogram within last year Pap Smear >5 years ago  Allergies Floxin *FLUOROQUINOLONES*  Medication History Ventolin HFA (108 (90 Base)MCG/ACT Aerosol Soln, Inhalation) Active. Eliquis (5MG  Tablet, Oral two times daily) Active. Propranolol HCl (60MG  Tablet, Oral four a day) Active. Losartan Potassium (100MG  Tablet, Oral) Active. AmLODIPine Besylate (5MG  Tablet, Oral) Active. Fenofibrate (160MG  Tablet, Oral) Active. Atorvastatin Calcium (10MG  Tablet, Oral) Active. Fish Oil (1000MG  Capsule, Oral four a day) Active. Iron (65MG  Tablet, Oral) Active. Multiple Vitamins (Oral daily) Active. Medications Reconciled  Social History Alcohol use Occasional alcohol use. Caffeine use Coffee. No drug use Tobacco use Former smoker.  Family History Arthritis Father. Heart Disease Father. Hypertension Mother.  Pregnancy / Birth History  Age at menarche 72 years. Age of menopause 74-50 Gravida 2 Maternal age 79-25 Para 2  Review of Systems  General Not Present- Appetite Loss, Chills, Fatigue, Fever, Night Sweats, Weight Gain and Weight Loss. Skin Present- Rash. Not Present- Change in Wart/Mole, Dryness, Hives, Jaundice, New Lesions, Non-Healing Wounds and Ulcer. HEENT Present- Seasonal Allergies and Wears glasses/contact lenses. Not Present- Earache, Hearing Loss, Hoarseness, Nose Bleed, Oral Ulcers, Ringing in the Ears, Sinus Pain, Sore Throat, Visual Disturbances and Yellow Eyes. Respiratory Present- Difficulty Breathing. Not Present- Bloody sputum, Chronic Cough, Snoring and Wheezing. Breast Not Present- Breast Mass, Breast Pain, Nipple Discharge and Skin Changes. Cardiovascular Present- Shortness of Breath. Not Present- Chest Pain, Difficulty Breathing Lying Down,  Leg Cramps, Palpitations, Rapid Heart Rate and Swelling of Extremities. Gastrointestinal Not Present- Abdominal Pain, Bloating, Bloody Stool, Change in Bowel Habits, Chronic diarrhea, Constipation, Difficulty Swallowing, Excessive gas, Gets full quickly at meals, Hemorrhoids, Indigestion, Nausea, Rectal Pain and Vomiting. Female Genitourinary Not Present- Frequency, Nocturia, Painful  Urination, Pelvic Pain and Urgency. Neurological Present- Tremor. Not Present- Decreased Memory, Fainting, Headaches, Numbness, Seizures, Tingling, Trouble walking and Weakness. Psychiatric Not Present- Anxiety, Bipolar, Change in Sleep Pattern, Depression, Fearful and Frequent crying. Endocrine Not Present- Cold Intolerance, Excessive Hunger, Hair Changes, Heat Intolerance, Hot flashes and New Diabetes. Hematology Not Present- Easy Bruising, Excessive bleeding, Gland problems, HIV and Persistent Infections.  Vitals Weight: 257 lb Height: 65in Body Surface Area: 2.31 m Body Mass Index: 42.77 kg/m  Temp.: 97.52F(Temporal)  Pulse: 81 (Regular)  BP: 126/76 (Sitting, Left Arm, Standard)  Physical Exam  General - appears comfortable, no distress; not diaphorectic  HEENT - normocephalic; sclerae clear, gaze conjugate; mucous membranes moist, dentition good; voice normal  Neck - symmetric on extension; no palpable anterior or posterior cervical adenopathy; no palpable masses in the thyroid bed  Chest - clear bilaterally without rhonchi, rales, or wheeze; patient appears mildly short of breath and mildly dyspneic  Cor - irregular rhythm with normal rate; no significant murmur  Ext - non-tender without significant edema or lymphedema  Neuro - grossly intact; moderate tremor   Assessment & Plan   NEOPLASM OF UNCERTAIN BEHAVIOR OF THYROID GLAND (D44.0)  MULTIPLE THYROID NODULES (E04.2)  Patient presents accompanied by her husband to discuss thyroid surgery. I provided them with written literature  on thyroid surgery to review at home.  Patient has a dominant nodule in the right thyroid lobe measuring 2.7 cm which is highly suspicious for malignancy. She has a small subcentimeter nodule in the left thyroid lobe. I have recommended total thyroidectomy for definitive diagnosis and management. The same recommendation has been made by the patient's endocrinologist.  We discussed total thyroidectomy at length. We reviewed the anatomy. We discussed potential complications including recurrent laryngeal nerve injury and injury to parathyroid glands. We discussed the hospital stay to be anticipated and the postoperative recovery. We discussed the potential need for radioactive iodine treatment. We discussed the need for lifelong thyroid hormone replacement. Patient and her husband understand and wish to proceed with surgery in the near future.  Patient will require clearance from medical specialist including cardiology and pulmonary medicine.  The risks and benefits of the procedure have been discussed at length with the patient. The patient understands the proposed procedure, potential alternative treatments, and the course of recovery to be expected. All of the patient's questions have been answered at this time. The patient wishes to proceed with surgery.  Earnstine Regal, MD, [facility] Office: [phone]

## 2015-03-02 NOTE — Transfer of Care (Signed)
Immediate Anesthesia Transfer of Care Note  Patient: Kristin Hood  Procedure(s) Performed: Procedure(s): TOTAL THYROIDECTOMY (N/A)  Patient Location: PACU  Anesthesia Type:General  Level of Consciousness: awake, alert , oriented and patient cooperative  Airway & Oxygen Therapy: Patient Spontanous Breathing and Patient connected to face mask oxygen  Post-op Assessment: Report given to RN, Post -op Vital signs reviewed and stable and Patient moving all extremities X 4  Post vital signs: Reviewed and stable  Last Vitals:  Filed Vitals:   03/02/15 0859  BP: 139/88  Pulse: 85  Temp: 36.7 C  Resp: 18    Complications: No apparent anesthesia complications

## 2015-03-02 NOTE — Interval H&P Note (Signed)
History and Physical Interval Note:  03/02/2015 9:17 AM  Kristin Hood  has presented today for surgery, with the diagnosis of Thyroid Neoplasm.  The various methods of treatment have been discussed with the patient and family. After consideration of risks, benefits and other options for treatment, the patient has consented to    Procedure(s): TOTAL THYROIDECTOMY (N/A) as a surgical intervention .    The patient's history has been reviewed, patient examined, no change in status, stable for surgery.  I have reviewed the patient's chart and labs.  Questions were answered to the patient's satisfaction.    Kristin Regal, MD, [facility] Office: [place]

## 2015-03-02 NOTE — Anesthesia Preprocedure Evaluation (Addendum)
Anesthesia Evaluation  Patient identified by MRN, date of birth, ID band Patient awake    History of Anesthesia Complications (+) Family history of anesthesia reaction  Airway Mallampati: I  TM Distance: >3 FB Neck ROM: Full    Dental  (+) Teeth Intact   Pulmonary shortness of breath, COPD, former smoker,    breath sounds clear to auscultation       Cardiovascular hypertension, + angina + dysrhythmias Atrial Fibrillation  Rhythm:Irregular Rate:Normal  Aug ECHO noted, AI, MR preserved LV function   Neuro/Psych CVA    GI/Hepatic hiatal hernia, GERD  ,  Endo/Other  Morbid obesity  Renal/GU      Musculoskeletal  (+) Arthritis ,   Abdominal (+) + obese,   Peds  Hematology   Anesthesia Other Findings   Reproductive/Obstetrics                            Anesthesia Physical Anesthesia Plan  ASA: III  Anesthesia Plan: General   Post-op Pain Management:    Induction: Intravenous  Airway Management Planned: Oral ETT  Additional Equipment:   Intra-op Plan:   Post-operative Plan: Extubation in OR  Informed Consent: I have reviewed the patients History and Physical, chart, labs and discussed the procedure including the risks, benefits and alternatives for the proposed anesthesia with the patient or authorized representative who has indicated his/her understanding and acceptance.   Dental advisory given  Plan Discussed with: CRNA and Surgeon  Anesthesia Plan Comments:         Anesthesia Quick Evaluation

## 2015-03-02 NOTE — Anesthesia Postprocedure Evaluation (Signed)
Anesthesia Post Note  Patient: Kristin Hood  Procedure(s) Performed: Procedure(s) (LRB): TOTAL THYROIDECTOMY (N/A)  Patient location during evaluation: PACU Anesthesia Type: General Level of consciousness: awake and alert Pain management: pain level controlled Vital Signs Assessment: post-procedure vital signs reviewed and stable Respiratory status: spontaneous breathing, nonlabored ventilation, respiratory function stable and patient connected to nasal cannula oxygen Cardiovascular status: blood pressure returned to baseline and stable Postop Assessment: no signs of nausea or vomiting Anesthetic complications: no    Last Vitals:  Filed Vitals:   03/02/15 1615 03/02/15 1833  BP: 127/65 143/75  Pulse: 80 87  Temp: 36.7 C 36.7 C  Resp: 20 20    Last Pain:  Filed Vitals:   03/02/15 1834  PainSc: 2                  Iolani Twilley,JAMES TERRILL

## 2015-03-03 DIAGNOSIS — D34 Benign neoplasm of thyroid gland: Secondary | ICD-10-CM | POA: Diagnosis not present

## 2015-03-03 LAB — BASIC METABOLIC PANEL
ANION GAP: 7 (ref 5–15)
BUN: 22 mg/dL — AB (ref 6–20)
CALCIUM: 9.1 mg/dL (ref 8.9–10.3)
CO2: 25 mmol/L (ref 22–32)
Chloride: 104 mmol/L (ref 101–111)
Creatinine, Ser: 0.78 mg/dL (ref 0.44–1.00)
GFR calc Af Amer: >60 mL/min (ref >60)
Glucose, Bld: 180 mg/dL — ABNORMAL HIGH (ref 65–99)
POTASSIUM: 4.7 mmol/L (ref 3.5–5.1)
SODIUM: 136 mmol/L (ref 135–145)

## 2015-03-03 MED ORDER — SYNTHROID 88 MCG PO TABS: 88.0000 ug | ORAL_TABLET | Freq: Every day | ORAL | Status: DC

## 2015-03-03 MED ORDER — HYDROCODONE-ACETAMINOPHEN 5-325 MG PO TABS: 1.0000 | ORAL_TABLET | ORAL | Status: DC | PRN

## 2015-03-03 MED ORDER — CALCIUM CARBONATE 1250 (500 CA) MG PO TABS: 2.0000 | ORAL_TABLET | Freq: Two times a day (BID) | ORAL | Status: DC

## 2015-03-03 NOTE — Progress Notes (Signed)
Pt discharged to home. DC instructions given. Prescription x 1 given for pain med. Pt encouraged to stop by her pharmacy and pick up med that has been e-prescribed by MD. Voiced understanding. Left unit in good condition. No concerns voiced. Vwilliams,rn.

## 2015-03-03 NOTE — Discharge Summary (Signed)
Physician Discharge Summary [facility]  Patient ID: Kristin Hood MRN: [id number] DOB/AGE: May 03, 1940 74 y.o.  Admit date: 03/02/2015 Discharge date: 03/03/2015  Admission Diagnoses:  Thyroid neoplasm of uncertain behavior  Discharge Diagnoses:  Principal Problem:   Neoplasm of uncertain behavior of thyroid gland Active Problems:   Right thyroid nodule   Discharged Condition: good  Hospital Course: Patient was admitted for observation following thyroid surgery.  Post op course was uncomplicated.  Pain was well controlled.  Tolerated diet.  Post op calcium level on morning following surgery was 9.1 mg/dl.  Patient was prepared for discharge home on POD#1.  Consults: None  Treatments: surgery: total thyroidectomy  Discharge Exam: Blood pressure 119/61, pulse 86, temperature 97.9 F (36.6 C), temperature source Oral, resp. rate 16, height 5\' 5"  (1.651 m), weight 117.482 kg (259 lb), SpO2 93 %. HEENT - clear Neck - wound dry and intact; mild STS; voice nearly normal Chest - clear bilaterally Cor - RRR  Disposition: Home  Discharge Instructions    Apply dressing    Complete by:  As directed   Apply light gauze dressing to wound before discharge home today.     Diet - low sodium heart healthy    Complete by:  As directed      Discharge instructions    Complete by:  As directed   [facility]  THYROID & PARATHYROID SURGERY:  POST-OP INSTRUCTIONS  Always review your discharge instruction sheet from the facility where your surgery was performed.  A prescription for pain medication may be given to you upon discharge.  Take your pain medication as prescribed.  If narcotic pain medicine is not needed, then you may take acetaminophen (Tylenol) or ibuprofen (Advil) as needed.  Take your usually prescribed medications unless otherwise directed.  If you need a refill on your pain medication, please contact your pharmacy. They will contact  our office to request authorization.  Prescriptions will not be processed by our office after 5 pm or on weekends.  Start with a light diet upon arrival home, such as soup and crackers or toast.  Be sure to drink plenty of fluids daily.  Resume your normal diet the day after surgery.  Most patients will experience some swelling and bruising on the chest and neck area.  Ice packs will help.  Swelling and bruising can take several days to resolve.   It is common to experience some constipation after surgery.  Increasing fluid intake and taking a stool softener will usually help or prevent this problem.  A mild laxative (Milk of Magnesia or Miralax) should be taken according to package directions if there has been no bowel movement after 48 hours.  You have steri-strips and a gauze dressing over your incision.  You may remove the gauze bandage on the second day after surgery, and you may shower at that time.  Leave your steri-strips (small skin tapes) in place directly over the incision.  These strips should remain on the skin for 5-7 days and then be removed.  You may get them wet in the shower and pat them dry.  You may resume regular (light) daily activities beginning the next day - such as daily self-care, walking, climbing stairs - gradually increasing activities as tolerated.  You may have sexual intercourse when it is comfortable.  Refrain from any heavy lifting or straining until approved by your doctor.  You may drive when you no longer are taking prescription pain medication,  you can comfortably wear a seatbelt, and you can safely maneuver your car and apply brakes.  You should see your doctor in the office for a follow-up appointment approximately two to three weeks after your surgery.  Make sure that you call for this appointment within a day or two after you arrive home to insure a convenient appointment time.  WHEN TO CALL YOUR DOCTOR: -- Fever greater than 101.5 -- Inability to  urinate -- Nausea and/or vomiting - persistent -- Extreme swelling or bruising -- Continued bleeding from incision -- Increased pain, redness, or drainage from the incision -- Difficulty swallowing or breathing -- Muscle cramping or spasms -- Numbness or tingling in hands or around lips  The clinic staff is available to answer your questions during regular business hours.  Please don't hesitate to call and ask to speak to one of the nurses if you have concerns.  Earnstine Regal, MD, [facility] Office: [phone]  Website: [email or web address]     Increase activity slowly    Complete by:  As directed      Remove dressing in 24 hours    Complete by:  As directed             Medication List    TAKE these medications        albuterol 108 (90 BASE) MCG/ACT inhaler  Commonly known as:  PROVENTIL HFA;VENTOLIN HFA  Inhale 2 puffs into the lungs every 4 (four) hours as needed for wheezing or shortness of breath.     amLODipine 5 MG tablet  Commonly known as:  NORVASC  Take 5 mg by mouth daily.     amoxicillin 500 MG capsule  Commonly known as:  AMOXIL  Take 2,000 mg by mouth once. 1 hour prior to dental appointments     atorvastatin 10 MG tablet  Commonly known as:  LIPITOR  Take 10 mg by mouth at bedtime.     calcium carbonate 1250 (500 CA) MG tablet  Commonly known as:  OS-CAL - dosed in mg of elemental calcium  Take 2 tablets (1,000 mg of elemental calcium total) by mouth 2 (two) times daily with a meal.     ELIQUIS 5 MG Tabs tablet  Generic drug:  apixaban  Take 1 tablet by mouth two  times daily     fenofibrate 160 MG tablet  Take 160 mg by mouth daily.     ferrous sulfate 325 (65 FE) MG EC tablet  Take 325 mg by mouth every evening.     FISH OIL PO  Take 2 capsules by mouth 2 (two) times daily.     HYDROcodone-acetaminophen 5-325 MG tablet  Commonly known as:  NORCO/VICODIN  Take 1-2 tablets  by mouth every 4 (four) hours as needed for moderate pain.     INCRUSE ELLIPTA 62.5 MCG/INH Aepb  Generic drug:  Umeclidinium Bromide  Inhale 1 puff into the lungs daily.     ketoconazole 2 % cream  Commonly known as:  NIZORAL  APPLY TO AFFECTED AREA EVERY DAY as needed for rash     losartan 100 MG tablet  Commonly known as:  COZAAR  Take 100 mg by mouth daily.     multivitamin with minerals Tabs tablet  Take 1 tablet by mouth daily.     propranolol 60 MG tablet  Commonly known as:  INDERAL  Take 2 tablets in the morning, 1 in the afternoon, and 1 in the evening.  SYNTHROID 88 MCG tablet  Generic drug:  levothyroxine  Take 1 tablet (88 mcg total) by mouth daily before breakfast.         Earnstine Regal, MD, [facility] Office: [phone]   Signed: Earnstine Regal 03/03/2015, 8:26 AM

## 2015-03-06 NOTE — Progress Notes (Signed)
Quick Note:  Please contact patient and notify of benign pathology results.  Tekeyah Santiago M. Riva Sesma, MD, FACS [facility] Office: [phone]   ______ 

## 2015-03-09 ENCOUNTER — Telehealth: Payer: Self-pay | Admitting: Emergency Medicine

## 2015-03-09 NOTE — Telephone Encounter (Signed)
Spoke with pt, incruse samples have been left up front for pt.  Nothing further needed.

## 2015-04-06 ENCOUNTER — Encounter (HOSPITAL_COMMUNITY): Payer: Self-pay | Admitting: *Deleted

## 2015-04-06 ENCOUNTER — Emergency Department (HOSPITAL_COMMUNITY): Payer: PPO

## 2015-04-06 ENCOUNTER — Emergency Department (HOSPITAL_COMMUNITY)
Admission: EM | Admit: 2015-04-06 | Discharge: 2015-04-06 | Disposition: A | Discharge status: Home / Self Care | Payer: PPO | Attending: Emergency Medicine | Admitting: Emergency Medicine

## 2015-04-06 DIAGNOSIS — Z87891 Personal history of nicotine dependence: Secondary | ICD-10-CM | POA: Diagnosis not present

## 2015-04-06 DIAGNOSIS — E7800 Pure hypercholesterolemia, unspecified: Secondary | ICD-10-CM | POA: Insufficient documentation

## 2015-04-06 DIAGNOSIS — H9190 Unspecified hearing loss, unspecified ear: Secondary | ICD-10-CM | POA: Insufficient documentation

## 2015-04-06 DIAGNOSIS — E041 Nontoxic single thyroid nodule: Secondary | ICD-10-CM | POA: Diagnosis not present

## 2015-04-06 DIAGNOSIS — M1990 Unspecified osteoarthritis, unspecified site: Secondary | ICD-10-CM | POA: Insufficient documentation

## 2015-04-06 DIAGNOSIS — Z7902 Long term (current) use of antithrombotics/antiplatelets: Secondary | ICD-10-CM | POA: Insufficient documentation

## 2015-04-06 DIAGNOSIS — J449 Chronic obstructive pulmonary disease, unspecified: Secondary | ICD-10-CM | POA: Insufficient documentation

## 2015-04-06 DIAGNOSIS — R072 Precordial pain: Secondary | ICD-10-CM | POA: Diagnosis not present

## 2015-04-06 DIAGNOSIS — Z8619 Personal history of other infectious and parasitic diseases: Secondary | ICD-10-CM | POA: Insufficient documentation

## 2015-04-06 DIAGNOSIS — Z8673 Personal history of transient ischemic attack (TIA), and cerebral infarction without residual deficits: Secondary | ICD-10-CM | POA: Diagnosis not present

## 2015-04-06 DIAGNOSIS — I4891 Unspecified atrial fibrillation: Secondary | ICD-10-CM | POA: Insufficient documentation

## 2015-04-06 DIAGNOSIS — E782 Mixed hyperlipidemia: Secondary | ICD-10-CM | POA: Insufficient documentation

## 2015-04-06 DIAGNOSIS — Z9221 Personal history of antineoplastic chemotherapy: Secondary | ICD-10-CM | POA: Insufficient documentation

## 2015-04-06 DIAGNOSIS — Z79899 Other long term (current) drug therapy: Secondary | ICD-10-CM | POA: Diagnosis not present

## 2015-04-06 DIAGNOSIS — Z853 Personal history of malignant neoplasm of breast: Secondary | ICD-10-CM | POA: Diagnosis not present

## 2015-04-06 DIAGNOSIS — R079 Chest pain, unspecified: Secondary | ICD-10-CM | POA: Diagnosis not present

## 2015-04-06 DIAGNOSIS — I209 Angina pectoris, unspecified: Secondary | ICD-10-CM | POA: Insufficient documentation

## 2015-04-06 DIAGNOSIS — R110 Nausea: Secondary | ICD-10-CM | POA: Diagnosis not present

## 2015-04-06 DIAGNOSIS — J9811 Atelectasis: Secondary | ICD-10-CM | POA: Diagnosis not present

## 2015-04-06 DIAGNOSIS — R011 Cardiac murmur, unspecified: Secondary | ICD-10-CM | POA: Diagnosis not present

## 2015-04-06 DIAGNOSIS — K219 Gastro-esophageal reflux disease without esophagitis: Secondary | ICD-10-CM | POA: Diagnosis not present

## 2015-04-06 DIAGNOSIS — I10 Essential (primary) hypertension: Secondary | ICD-10-CM | POA: Insufficient documentation

## 2015-04-06 DIAGNOSIS — Z86010 Personal history of colonic polyps: Secondary | ICD-10-CM | POA: Insufficient documentation

## 2015-04-06 LAB — BASIC METABOLIC PANEL
Anion gap: 12 (ref 5–15)
BUN: 21 mg/dL — AB (ref 6–20)
CHLORIDE: 106 mmol/L (ref 101–111)
CO2: 21 mmol/L — AB (ref 22–32)
CREATININE: 0.94 mg/dL (ref 0.44–1.00)
Calcium: 10.5 mg/dL — ABNORMAL HIGH (ref 8.9–10.3)
GFR calc Af Amer: >60 mL/min (ref >60)
GFR calc non Af Amer: 58 mL/min — ABNORMAL LOW (ref >60)
Glucose, Bld: 97 mg/dL (ref 65–99)
POTASSIUM: 4.7 mmol/L (ref 3.5–5.1)
SODIUM: 139 mmol/L (ref 135–145)

## 2015-04-06 LAB — CBC
HEMATOCRIT: 43.7 % (ref 36.0–46.0)
Hemoglobin: 14.2 g/dL (ref 12.0–15.0)
MCH: 29.4 pg (ref 26.0–34.0)
MCHC: 32.5 g/dL (ref 30.0–36.0)
MCV: 90.5 fL (ref 78.0–100.0)
PLATELETS: 274 10*3/uL (ref 150–400)
RBC: 4.83 MIL/uL (ref 3.87–5.11)
RDW: 16.4 % — AB (ref 11.5–15.5)
WBC: 5.9 10*3/uL (ref 4.0–10.5)

## 2015-04-06 LAB — I-STAT TROPONIN, ED
TROPONIN I, POC: 0 ng/mL (ref 0.00–0.08)
Troponin i, poc: 0 ng/mL (ref 0.00–0.08)

## 2015-04-06 NOTE — ED Notes (Signed)
Pt departed with family and in NAD.  

## 2015-04-06 NOTE — ED Provider Notes (Signed)
CSN: [id number]     Arrival date & time 04/06/15  1757 History   First MD Initiated Contact with Patient 04/06/15 2030     Chief Complaint  Patient presents with  . Chest Pain     (Consider location/radiation/quality/duration/timing/severity/associated sxs/prior Treatment) Patient is a 75 y.o. female presenting with chest pain. The history is provided by the patient.  Chest Pain Pain location:  Substernal area Pain quality: pressure   Pain radiates to:  Does not radiate Pain radiates to the back: no   Pain severity:  Moderate Onset quality:  Gradual Duration:  3 weeks Timing:  Sporadic Progression:  Waxing and waning Chronicity:  Recurrent Relieved by:  Nothing Worsened by:  Nothing tried Ineffective treatments:  None tried Associated symptoms: nausea   Associated symptoms: no dizziness, no fever, no headache, no palpitations, no shortness of breath and not vomiting   Risk factors: high cholesterol and hypertension    75 yo F with off and on chest pain for past three weeks.  Worsening this week.  Had worsening over past couple days.  Had one episode more severe than normal.  Substernal, denies radiation.  Patient was occurs right before she says to make dinner. Today had some pain right before she started making breakfast. Pain only last for about 30 minutes at a time. Denies shortness of breath diaphoresis nausea or vomiting. Patient with no history of coronary disease.   Past Medical History  Diagnosis Date  . Hypertension     , With mild concentric left ventricular hypertrophy  . Shingles   . Atrial fibrillation ([place]) 07/2012    Eliquis, rate controlled  . Hx of adenomatous colonic polyps 2006    , Benign  . Mixed dyslipidemia   . Coarse tremors     , Essential  . Hypercholesteremia   . Breast cancer ([place]) 2001    left mastectomy  . Complication of anesthesia     SMALLER TUBE FOR INTUBATION AS GAGS ON TUBE  . Dysrhythmia 08/20/2012    Atrial Fibrillation  . Stroke  ([place]) 11/17/2011    left side brain-speech-TPA  . CVA (cerebral vascular accident) ([place]) 2013  . Valvular sclerosis 09/23/2003    without stenosis  . History of chemotherapy 11/13/1998 to 2010    Adriamycin/Docotaxol, Dexorubicin/Taxotere, Femara, Tamoxifen  . Heart murmur   . Thyroid nodule   . Tremor, essential   . Family history of adverse reaction to anesthesia     mother experienced pain with intubation   . Anginal pain ([place])   . Varicose veins   . Spider veins   . COPD (chronic obstructive pulmonary disease) ([place])   . Shortness of breath dyspnea     on exertion   . Hard of hearing   . History of hiatal hernia     history of   . GERD (gastroesophageal reflux disease)     history of   . Arthritis    Past Surgical History  Procedure Laterality Date  . Tee without cardioversion  11/19/2011    Procedure: TRANSESOPHAGEAL ECHOCARDIOGRAM (TEE);  Surgeon: [name], MD;  Location: [facility] ENDOSCOPY;  Service: Cardiovascular;  Laterality: N/A;  . Knee surgery  2009    ,Total knee replacement  . Bunionectomy  11/29/1988    bilateral with hammer toes  . Left rotator cuff    . Cesarean section    . Left heart catheterization with coronary angiogram N/A 08/14/2011    Procedure: LEFT HEART CATHETERIZATION WITH CORONARY ANGIOGRAM;  Surgeon:  [name], MD;  Location: [facility] CATH LAB;  Service: Cardiovascular;  Laterality: N/A;  . Appendectomy  07/10/1957  . Cesarean section  05/1968, 07/1965  . Mastectomy  10/19/1998    Radical, left -with lymph nodes (8 total)  . Reverse bunionectomy  08/29/1993    removal of Tibial Sesamoid-left  . Joint replacement  05/2001    left knee  . Joint replacement  11/2001    right knee  . Joint replacement  01/2008    redo right knee  . Biopsy thyroid  12/01/2013    ultrasound  . Cardiac catheterization  07/2010    , Without significant CAD  . Cardiac catheterization  07/2011    [name]  . Total hip arthroplasty Left 10/11/2014    Procedure: LEFT  TOTAL HIP ARTHROPLASTY ANTERIOR APPROACH;  Surgeon: [name], MD;  Location: [facility] ORS;  Service: Orthopedics;  Laterality: Left;  Marland Kitchen Eye surgery  01/31/2013    eye lid; cataract surgery bilat   . Breast surgery    . Tonsillectomy    . Thyroidectomy N/A 03/02/2015    Procedure: TOTAL THYROIDECTOMY;  Surgeon: Armandina Gemma, MD;  Location: [facility] ORS;  Service: General;  Laterality: N/A;   Family History  Problem Relation Age of Onset  . Heart disease Mother   . Heart disease Father   . Heart failure Father    Social History  Substance Use Topics  . Smoking status: Former Smoker -- 2.00 packs/day for 30 years    Types: Cigarettes    Quit date: 01/30/1992  . Smokeless tobacco: Never Used  . Alcohol Use: 0.6 oz/week    1 Standard drinks or equivalent per week     Comment: wine occassionally   OB History    No data available     Review of Systems  Constitutional: Negative for fever and chills.       Flushed feeling   HENT: Negative for congestion and rhinorrhea.   Eyes: Negative for redness and visual disturbance.  Respiratory: Negative for shortness of breath and wheezing.   Cardiovascular: Positive for chest pain. Negative for palpitations.  Gastrointestinal: Positive for nausea. Negative for vomiting.  Genitourinary: Negative for dysuria and urgency.  Musculoskeletal: Negative for myalgias and arthralgias.  Skin: Negative for pallor and wound.  Neurological: Negative for dizziness and headaches.      Allergies  Floxin  Home Medications   Prior to Admission medications   Medication Sig Start Date End Date Taking? Authorizing Provider  albuterol (PROVENTIL HFA;VENTOLIN HFA) 108 (90 BASE) MCG/ACT inhaler Inhale 2 puffs into the lungs every 4 (four) hours as needed for wheezing or shortness of breath. 11/17/14  Yes Shanker Kristeen Mans, MD  amLODipine (NORVASC) 5 MG tablet Take 5 mg by mouth daily.   Yes Historical Provider, MD  amoxicillin (AMOXIL) 500 MG capsule Take 2,000 mg  by mouth See admin instructions. Take 4 capsules (2000 mg) by mouth 1 hour prior to dental appointments   Yes Historical Provider, MD  atorvastatin (LIPITOR) 10 MG tablet Take 10 mg by mouth daily after supper.    Yes Historical Provider, MD  ELIQUIS 5 MG TABS tablet Take 1 tablet by mouth two  times daily 11/20/14  Yes Jerline Pain, MD  fenofibrate 160 MG tablet Take 160 mg by mouth daily after supper.    Yes Historical Provider, MD  ferrous sulfate 325 (65 FE) MG EC tablet Take 325 mg by mouth daily after supper.    Yes Historical Provider,  MD  ketoconazole (NIZORAL) 2 % cream APPLY TO AFFECTED AREA DAILY AS NEEDED FOR RASH 01/24/15  Yes Historical Provider, MD  losartan (COZAAR) 100 MG tablet Take 100 mg by mouth daily.   Yes Historical Provider, MD  Multiple Vitamin (MULTIVITAMIN WITH MINERALS) TABS tablet Take 1 tablet by mouth daily.   Yes Historical Provider, MD  Omega-3 Fatty Acids (FISH OIL PO) Take 1,960 mg by mouth 2 (two) times daily. 980 mg EPA-DHA per capsule   Yes Historical Provider, MD  propranolol (INDERAL) 60 MG tablet Take 2 tablets in the morning, 1 in the afternoon, and 1 in the evening. Patient taking differently: Take 60-120 mg by mouth 3 (three) times daily. Take 2 tablets (120 mg) by mouth after breakfast, take 1 tablet (60 mg) after lunch and supper 07/31/14  Yes Jerline Pain, MD  SYNTHROID 88 MCG tablet Take 1 tablet (88 mcg total) by mouth daily before breakfast. 03/03/15  Yes Armandina Gemma, MD  Umeclidinium Bromide (INCRUSE ELLIPTA) 62.5 MCG/INH AEPB Inhale 1 puff into the lungs daily.   Yes Historical Provider, MD  calcium carbonate (OS-CAL - DOSED IN MG OF ELEMENTAL CALCIUM) 1250 (500 CA) MG tablet Take 2 tablets (1,000 mg of elemental calcium total) by mouth 2 (two) times daily with a meal. Patient not taking: Reported on 04/06/2015 03/03/15   Armandina Gemma, MD  HYDROcodone-acetaminophen (NORCO/VICODIN) 5-325 MG tablet Take 1-2 tablets by mouth every 4 (four) hours as needed  for moderate pain. Patient not taking: Reported on 04/06/2015 03/03/15   Armandina Gemma, MD   BP 119/74 mmHg  Pulse 72  Temp(Src) 97.8 F (36.6 C) (Oral)  Resp 16  SpO2 95% Physical Exam  Constitutional: She is oriented to person, place, and time. She appears well-developed and well-nourished. No distress.  HENT:  Head: Normocephalic and atraumatic.  Eyes: EOM are normal. Pupils are equal, round, and reactive to light.  Neck: Normal range of motion. Neck supple.  Cardiovascular: Normal rate and regular rhythm.  Exam reveals no gallop and no friction rub.   No murmur heard. Pulmonary/Chest: Effort normal. She has no wheezes. She has no rales.  Abdominal: Soft. She exhibits no distension. There is no tenderness.  Musculoskeletal: She exhibits no edema or tenderness.  Neurological: She is alert and oriented to person, place, and time.  Skin: Skin is warm and dry. She is not diaphoretic.  Psychiatric: She has a normal mood and affect. Her behavior is normal.  Nursing note and vitals reviewed.   ED Course  Procedures (including critical care time) Labs Review Labs Reviewed  BASIC METABOLIC PANEL - Abnormal; Notable for the following:    CO2 21 (*)    BUN 21 (*)    Calcium 10.5 (*)    GFR calc non Af Amer 58 (*)    All other components within normal limits  CBC - Abnormal; Notable for the following:    RDW 16.4 (*)    All other components within normal limits  I-STAT TROPOININ, ED  Randolm Idol, ED    Imaging Review Dg Chest 2 View  04/06/2015  CLINICAL DATA:  LEFT-sided chest pain and tightness EXAM: CHEST  2 VIEW COMPARISON:  02/26/2015 FINDINGS: Normal cardiac silhouette. There is bibasilar linear markings suggesting atelectasis which is similar to comparison exam. No focal infiltrate. No pleural fluid. No pulmonary edema. IMPRESSION: Bibasilar atelectasis and chronic bronchitic markings. No clear acute findings. Electronically Signed   By: Suzy Bouchard M.D.   On:  04/06/2015 19:22  I have personally reviewed and evaluated these images and lab results as part of my medical decision-making.   EKG Interpretation   Date/Time:  Friday April 06 2015 18:19:38 EST Ventricular Rate:  82 PR Interval:    QRS Duration: 82 QT Interval:  378 QTC Calculation: 441 R Axis:   -35 Text Interpretation:  Atrial fibrillation Left axis deviation Anterior  infarct , age undetermined Abnormal ECG No significant change since last  tracing Confirmed by Klynn Linnemann MD, Quillian Quince [phone]) on 04/06/2015 9:10:30 PM      MDM   Final diagnoses:  Chest pain, unspecified chest pain type    74 yo F with a chief complaint of chest pain. Feels atypical of cardiac chest pain. EKG with no significant changes. Chest x-ray unremarkable. History concerning for reflux disease. Patient had recent surgery however is on Eliquis so doubt PE. Delta trop negative. We'll have her take Zantac twice a day and follow with her family physician for further evaluation.   I have discussed the diagnosis/risks/treatment options with the patient and family and believe the pt to be eligible for discharge home to follow-up with [facility]. We also discussed returning to the ED immediately if new or worsening sx occur. We discussed the sx which are most concerning (e.g., sudden worsening pain, fever, inability to tolerate by mouth) that necessitate immediate return. Medications administered to the patient during their visit and any new prescriptions provided to the patient are listed below.  Medications given during this visit Medications - No data to display  Discharge Medication List as of 04/06/2015 10:21 PM      The patient appears reasonably screen and/or stabilized for discharge and I doubt any other medical condition or other [facility] requiring further screening, evaluation, or treatment in the ED at this time prior to discharge.      Deno Etienne, DO 04/07/15 1439

## 2015-04-06 NOTE — ED Notes (Signed)
Pt reports intermittent chest discomfort x 2 days. Denies sob. ekg done at triage and airway intact.

## 2015-04-06 NOTE — Discharge Instructions (Signed)

## 2015-04-09 ENCOUNTER — Encounter: Payer: Self-pay | Admitting: Cardiology

## 2015-04-09 NOTE — Telephone Encounter (Signed)
ER visit reassuring, normal troponin. Prior echo with normal ejection fraction. Prior cardiac catheterization with no CAD.  Most recent encounter with cardiology was on 11/17/14 with Dr. Haroldine Laws.   She does not need to come for appointment on Wednesday given recent reassurance. Please have her return to the clinic as scheduled perhaps in the next 2-3 months.  [name], MD

## 2015-04-10 ENCOUNTER — Encounter: Payer: Self-pay | Admitting: Emergency Medicine

## 2015-04-11 DIAGNOSIS — R590 Localized enlarged lymph nodes: Secondary | ICD-10-CM | POA: Diagnosis not present

## 2015-04-11 DIAGNOSIS — Z853 Personal history of malignant neoplasm of breast: Secondary | ICD-10-CM | POA: Diagnosis not present

## 2015-04-11 DIAGNOSIS — I10 Essential (primary) hypertension: Secondary | ICD-10-CM | POA: Diagnosis not present

## 2015-04-11 DIAGNOSIS — I4891 Unspecified atrial fibrillation: Secondary | ICD-10-CM | POA: Diagnosis not present

## 2015-04-11 DIAGNOSIS — R079 Chest pain, unspecified: Secondary | ICD-10-CM | POA: Diagnosis not present

## 2015-04-11 DIAGNOSIS — E890 Postprocedural hypothyroidism: Secondary | ICD-10-CM | POA: Diagnosis not present

## 2015-04-12 MED ORDER — TIOTROPIUM BROMIDE MONOHYDRATE 2.5 MCG/ACT IN AERS
2.0000 | INHALATION_SPRAY | Freq: Every day | RESPIRATORY_TRACT | Status: DC
Start: 2015-04-12 — End: 2015-12-08

## 2015-04-14 ENCOUNTER — Encounter: Payer: Self-pay | Admitting: Cardiology

## 2015-04-18 ENCOUNTER — Encounter: Payer: Self-pay | Admitting: Cardiology

## 2015-04-18 ENCOUNTER — Telehealth: Payer: Self-pay | Admitting: *Deleted

## 2015-04-18 MED ORDER — APIXABAN 5 MG PO TABS: 5.0000 mg | ORAL_TABLET | Freq: Two times a day (BID) | ORAL | Status: DC

## 2015-04-18 NOTE — Telephone Encounter (Signed)
On January 14th I sent you a message asking that you send a new 90 day prescription for Eliquis -5 mg out to [facility] on [address]. I Have changed insurance companies. If I get a 90 day supply, it is cheaper than a 30 day supply.         This is my second request to you. Please follow up on this. This is not the first time I have had problems getting responses from your office.        Kristin Hood    DOB [birth year]    Eliquis sent into preferred pharmacy.  Will contact pt to schedule f/u appt with [name].

## 2015-05-02 ENCOUNTER — Encounter: Payer: Self-pay | Admitting: Internal Medicine

## 2015-05-02 ENCOUNTER — Ambulatory Visit (INDEPENDENT_AMBULATORY_CARE_PROVIDER_SITE_OTHER): Payer: PPO | Admitting: Internal Medicine

## 2015-05-02 VITALS — BP 114/68 | HR 76 | Temp 97.6°F | Resp 14 | Wt 259.0 lb

## 2015-05-02 DIAGNOSIS — E890 Postprocedural hypothyroidism: Secondary | ICD-10-CM | POA: Diagnosis not present

## 2015-05-02 HISTORY — DX: Postprocedural hypothyroidism: E89.0

## 2015-05-02 MED ORDER — SYNTHROID 100 MCG PO TABS: 100.0000 ug | ORAL_TABLET | Freq: Every day | ORAL | Status: DC

## 2015-05-02 NOTE — Progress Notes (Signed)
Patient ID: SAPHIA VANDERFORD, female   DOB: September 08, 1940, 75 y.o.   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 Last visit 4 months ago.  Reviewed hx: She described having L-sided chest pressure (11/11/2013) >> went to the ED: found to be in A fib. Also had a Chest CT: Mediastinal and retroperitoneal lymph nodes, so a PET scan was recommended: a thyroid nodule appeared hypermetabolic.   PET scan (67/34/1937): hypermetabolic R thyroid nodule  Thyroid U/S (11/21/2013): R 2.7 x 1.8 cm nodule, heterogeneous, with coarse calcifications  R nodule Bx (12/01/2013): FLUS  11/30/2013 TSH: 6.84 (0.34-4.5).  R nodule Bx (07/11/2014): FLUS  07/31/2014: Called and d/w pt about the results of the Afirma test, which is "suspicious for neoplasm" (test results scanned under Media tab). This carries a risk of 75% for cancer. In this case, I suggested total thyroidectomy.   03/02/2015: Total thyroidectomy - Dr Harlow Asa >> benign pathology! (surprisingly)  She is now on Synthroid 88 mcg daily: - in am - fasting - eats b'fast >30 min later - took calcium in am up to 1 mo ago - takes MVI with breakfast ~1h after Synthroid! - takes Zantac at noon - takes iron in the evening  Reviewed TFTs: 04/11/2015: TSH 40.5! Lab Results  Component Value Date   TSH 4.02 12/11/2014   FREET4 1.13 12/11/2014   Pt denies feeling nodules in neck, + hoarseness, no dysphagia/odynophagia, SOB with lying down.  Pt c/o: - + fatigue - + hoarseness - + tremors (hereditary) - since a teenager - no heat intolerance/cold intolerance - no palpitations - no anxiety/no depression - no hyperdefecation/no constipation - no weight loss/no gain - no dry skin - no hair falling  Pt does have a FH of thyroid ds.: mother (hypothyroidism). No FH of thyroid cancer. No h/o radiation tx to  head or neck. She had ChTx for her BrCA - dx 2000.  No seaweed or kelp, no recent contrast studies. No steroid use. No herbal supplements.   ROS: Constitutional: + see HPI Eyes: no blurry vision, no xerophthalmia ENT: no sore throat, see HPI Cardiovascular: no CP/+ exertional SOB/no palpitations/leg swelling Respiratory: no cough/+ exertional SOB Gastrointestinal: no N/V/D/C Musculoskeletal: no muscle/joint aches Skin: no rashes Neurological: + tremors/no numbness/tingling/dizziness  I reviewed pt's medications, allergies, PMH, social hx, family hx, and changes were documented in the history of present illness. Otherwise, unchanged from my initial visit note.  Past Medical History  Diagnosis Date  . Hypertension     , With mild concentric left ventricular hypertrophy  . Shingles   . Atrial fibrillation ([place]) 07/2012    Eliquis, rate controlled  . Hx of adenomatous colonic polyps 2006    , Benign  . Mixed dyslipidemia   . Coarse tremors     , Essential  . Hypercholesteremia   . Breast cancer ([place]) 2001    left mastectomy  . Complication of anesthesia     SMALLER TUBE FOR INTUBATION AS GAGS ON TUBE  . Dysrhythmia 08/20/2012    Atrial Fibrillation  . Stroke ([place]) 11/17/2011    left side brain-speech-TPA  . CVA (cerebral vascular accident) ([place]) 2013  . Valvular sclerosis 09/23/2003    without stenosis  . History of chemotherapy 11/13/1998 to 2010    Adriamycin/Docotaxol, Dexorubicin/Taxotere, Femara, Tamoxifen  . Heart murmur   . Thyroid nodule   .  Tremor, essential   . Family history of adverse reaction to anesthesia     mother experienced pain with intubation   . Anginal pain ([place])   . Varicose veins   . Spider veins   . COPD (chronic obstructive pulmonary disease) ([place])   . Shortness of breath dyspnea     on exertion   . Hard of hearing   . History of hiatal hernia     history of   . GERD (gastroesophageal reflux disease)     history of   . Arthritis     Past Surgical History  Procedure Laterality Date  . Tee without cardioversion  11/19/2011    Procedure: TRANSESOPHAGEAL ECHOCARDIOGRAM (TEE);  Surgeon: [name], MD;  Location: [facility] ENDOSCOPY;  Service: Cardiovascular;  Laterality: N/A;  . Knee surgery  2009    ,Total knee replacement  . Bunionectomy  11/29/1988    bilateral with hammer toes  . Left rotator cuff    . Cesarean section    . Left heart catheterization with coronary angiogram N/A 08/14/2011    Procedure: LEFT HEART CATHETERIZATION WITH CORONARY ANGIOGRAM;  Surgeon: [name], MD;  Location: [facility] CATH LAB;  Service: Cardiovascular;  Laterality: N/A;  . Appendectomy  07/10/1957  . Cesarean section  05/1968, 07/1965  . Mastectomy  10/19/1998    Radical, left -with lymph nodes (8 total)  . Reverse bunionectomy  08/29/1993    removal of Tibial Sesamoid-left  . Joint replacement  05/2001    left knee  . Joint replacement  11/2001    right knee  . Joint replacement  01/2008    redo right knee  . Biopsy thyroid  12/01/2013    ultrasound  . Cardiac catheterization  07/2010    , Without significant CAD  . Cardiac catheterization  07/2011    [name]  . Total hip arthroplasty Left 10/11/2014    Procedure: LEFT TOTAL HIP ARTHROPLASTY ANTERIOR APPROACH;  Surgeon: [name], MD;  Location: [facility] ORS;  Service: Orthopedics;  Laterality: Left;  Marland Kitchen Eye surgery  01/31/2013    eye lid; cataract surgery bilat   . Breast surgery    . Tonsillectomy    . Thyroidectomy N/A 03/02/2015    Procedure: TOTAL THYROIDECTOMY;  Surgeon: Armandina Gemma, MD;  Location: [facility] ORS;  Service: General;  Laterality: N/A;   History   Social History  . Marital Status: Married    Spouse Name: N/A    Number of Children: 2   Occupational History  . homemaker   Social History Main Topics  . Smoking status: Former Smoker -- 2.50 packs/day for 13 years    Types: Cigarettes    Quit date: 01/30/1992  . Smokeless tobacco: Never Used  . Alcohol Use:      3 drink(s) per week  . Drug Use: No   Current Outpatient Prescriptions on File Prior to Visit  Medication Sig Dispense Refill  . albuterol (PROVENTIL HFA;VENTOLIN HFA) 108 (90 BASE) MCG/ACT inhaler Inhale 2 puffs into the lungs every 4 (four) hours as needed for wheezing or shortness of breath. 1 Inhaler 1  . amLODipine (NORVASC) 5 MG tablet Take 5 mg by mouth daily.    Marland Kitchen apixaban (ELIQUIS) 5 MG TABS tablet Take 1 tablet (5 mg total) by mouth 2 (two) times daily. 180 tablet 1  . atorvastatin (LIPITOR) 10 MG tablet Take 10 mg by mouth daily after supper.     . fenofibrate 160 MG tablet Take 160 mg by mouth daily  after supper.     . ferrous sulfate 325 (65 FE) MG EC tablet Take 325 mg by mouth daily after supper.     . losartan (COZAAR) 100 MG tablet Take 100 mg by mouth daily.    . Multiple Vitamin (MULTIVITAMIN WITH MINERALS) TABS tablet Take 1 tablet by mouth daily.    . Omega-3 Fatty Acids (FISH OIL PO) Take 1,960 mg by mouth 2 (two) times daily. 980 mg EPA-DHA per capsule    . propranolol (INDERAL) 60 MG tablet Take 2 tablets in the morning, 1 in the afternoon, and 1 in the evening. (Patient taking differently: Take 60-120 mg by mouth 3 (three) times daily. Take 2 tablets (120 mg) by mouth after breakfast, take 1 tablet (60 mg) after lunch and supper) 360 tablet 3  . SYNTHROID 88 MCG tablet Take 1 tablet (88 mcg total) by mouth daily before breakfast. 30 tablet 1  . Tiotropium Bromide Monohydrate (SPIRIVA RESPIMAT) 2.5 MCG/ACT AERS Inhale 2 puffs into the lungs daily. 3 Inhaler 1  . ketoconazole (NIZORAL) 2 % cream Reported on 05/02/2015  0   No current facility-administered medications on file prior to visit.   Allergies  Allergen Reactions  . Floxin [Ofloxacin] Nausea Only and Other (See Comments)    Dizziness, Vertigo   Family History  Problem Relation Age of Onset  . Heart disease Mother   . Heart disease Father   . Heart failure Father    PE: BP 114/68 mmHg  Pulse 76   Temp(Src) 97.6 F (36.4 C) (Oral)  Resp 14  Wt 259 lb (117.482 kg)  SpO2 98% Body mass index is 43.1 kg/(m^2). Wt Readings from Last 3 Encounters:  05/02/15 259 lb (117.482 kg)  03/02/15 259 lb (117.482 kg)  02/26/15 259 lb (117.482 kg)   Constitutional: obese, in NAD Eyes: PERRLA, EOMI, no exophthalmos ENT: moist mucous membranes; thyroidectomy scar still slightly erythematous, but without swelling, pain, dysesthesia; no nodule palpatedno cervical lymphadenopathy Cardiovascular: RRR, No MRG Respiratory: CTA B Gastrointestinal: abdomen soft, NT, ND, BS+ Musculoskeletal: no deformities, strength intact in all 4;  Skin: moist, warm, no rashes Neurological: + tremor with outstretched hands, DTR 0/4 in B knees (previous sx)  ASSESSMENT: 1. Postoperative hypothyroidism - h/o R thyroid nodule, hypermetabolic on PET, FLUS x2 on Bx, Afirma + >> benign final pathology  PLAN: 1. 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 With all the above factors pointing towards thyroid cancer,I recommended surgery >> she had this 03/02/2015 and very surprisingly, the final pathology was benign. - she was started on Synthroid d.a.w. 88 g After the surgery, which she continues today - discussed how to take it correctly: every day, with water, >30 minutes before breakfast, separated by >4 hours from acid reflux medications, calcium, iron, multivitamins. She is taking the multivitamins an hour after her Synthroid, and I advised her to move this  4 hours later at least. - we reviewed together her most recent TSH obtained earlier this month by her PCP, which was very high, at 40. Aside the above changes in how she takes the medicine, I will also increase the Synthroid dose to 100 g daily. I believe we will need to increase it even  further, but I would not want to increase it too fast. - will check TFTs in 5-6 weeks after the above change  Return in about 4 months (around 08/30/2015).

## 2015-05-02 NOTE — Patient Instructions (Signed)
Please come back for labs in 5-6 weeks.  Please increase the Synthroid to 100 mcg daily.  Take the thyroid hormone every day, with water, at least 30 minutes before breakfast, separated by at least 4 hours from: - acid reflux medications - calcium - iron - multivitamins  Please come back for a follow-up appointment in 4 months.

## 2015-05-16 ENCOUNTER — Ambulatory Visit (INDEPENDENT_AMBULATORY_CARE_PROVIDER_SITE_OTHER): Payer: PPO | Admitting: Cardiology

## 2015-05-16 ENCOUNTER — Encounter: Payer: Self-pay | Admitting: Cardiology

## 2015-05-16 VITALS — BP 124/72 | HR 82 | Ht 66.0 in | Wt 259.8 lb

## 2015-05-16 DIAGNOSIS — I482 Chronic atrial fibrillation, unspecified: Secondary | ICD-10-CM

## 2015-05-16 DIAGNOSIS — I5032 Chronic diastolic (congestive) heart failure: Secondary | ICD-10-CM | POA: Diagnosis not present

## 2015-05-16 DIAGNOSIS — R0602 Shortness of breath: Secondary | ICD-10-CM | POA: Diagnosis not present

## 2015-05-16 MED ORDER — PROPRANOLOL HCL 60 MG PO TABS: ORAL_TABLET | ORAL | Status: DC

## 2015-05-16 NOTE — Patient Instructions (Signed)

## 2015-05-16 NOTE — Progress Notes (Signed)
Patient ID: Kristin Hood, female   DOB: Oct 29, 1940, 75 y.o.   MRN: [id number]      [address] [place], [place]  [address] Phone: [phone] Fax:  [phone]  Date:  05/16/2015   ID:  Kristin Hood, DOB 1940-11-09, MRN [id number]  PCP:  Gennette Pac, MD   History of Present Illness: Kristin Hood is a 75 y.o. female with atrial fibrillation discovered on 08/20/12, chronic anticoagulation, prior stroke, received TPA for cerebral artery occlusion (could not raise arm-8/13) here for followup.  Echocardiogram demonstrated mild to moderate left atrial enlargement, normal ejection fraction, mild aortic stenosis, MAC, mild mitral regurgitation.  Previously she did have some associated dyspnea on exertion with no chest pain, previously described as moderate in severity relieved with rest with no radiation of symptoms.  No associated angina. She had stress test which showed inferior ischemia, transient ischemic dilatation with normal ejection fraction. Does water exercises. Had 3 knee replacements. 25 min of stair steppers. She only went 2 minutes on the prior exercise treadmill test before significant shortness of breath ensued.   She underwent catheterization on 08/14/11 which showed no evidence of coronary artery disease. LVEDP was 14 mm mercury. This was after possible inferior wall ischemia as well as 3 times a day was seen on nuclear stress test.  Went to ER on 12/10/12 had a terrific pain in her chest. Lasted about hour at home BP 159/100, 3 times. Went on to ER. Was worried. Once she got to [facility] she quit hurting. Sent to ER. Wanted to keep her overnight. Did troponin did well. Did OK. Prob GERD.   Went again to [facility]. Afib increased rate. PET scan. 3 nodules one thyroid. Endocrine, no good sample.  Atrial fibrillation has been under good control.  Unfortunately, her son-in-law died 2 weeks after neck surgery from pulmonary embolism.  07/31/14 - Here for  preop hip surgery. [facility] - Propanolol OK. Had GERD in evening at times. After dinner. Aware of AFIB. She is eager to perform hip surgery. Dr. Wynelle Link. She has had extensive workup including oncology workup for nodules in her lungs, lymph nodes, thyroid nodule. She will be followed up on a yearly basis and oncology. Reassurance. Overall, no chest pain other than mild GERD at night after dinner. No syncope, no change in shortness of breath.  05/16/15-baseline shortness of breath. She did go to the emergency room because of centralized chest discomfort. Troponin was normal, EKG unremarkable. Thankfully she started taking Zantac and this was improved.  Wt Readings from Last 3 Encounters:  05/16/15 259 lb 12.8 oz (117.845 kg)  05/02/15 259 lb (117.482 kg)  03/02/15 259 lb (117.482 kg)     Past Medical History  Diagnosis Date  . Hypertension     , With mild concentric left ventricular hypertrophy  . Shingles   . Atrial fibrillation ([place]) 07/2012    Eliquis, rate controlled  . Hx of adenomatous colonic polyps 2006    , Benign  . Mixed dyslipidemia   . Coarse tremors     , Essential  . Hypercholesteremia   . Breast cancer ([place]) 2001    left mastectomy  . Complication of anesthesia     SMALLER TUBE FOR INTUBATION AS GAGS ON TUBE  . Dysrhythmia 08/20/2012    Atrial Fibrillation  . Stroke ([place]) 11/17/2011    left side brain-speech-TPA  . CVA (cerebral vascular accident) ([place]) 2013  . Valvular sclerosis 09/23/2003  without stenosis  . History of chemotherapy 11/13/1998 to 2010    Adriamycin/Docotaxol, Dexorubicin/Taxotere, Femara, Tamoxifen  . Heart murmur   . Thyroid nodule   . Tremor, essential   . Family history of adverse reaction to anesthesia     mother experienced pain with intubation   . Anginal pain ([place])   . Varicose veins   . Spider veins   . COPD (chronic obstructive pulmonary disease) ([place])   . Shortness of breath dyspnea     on exertion   . Hard of hearing   .  History of hiatal hernia     history of   . GERD (gastroesophageal reflux disease)     history of   . Arthritis     Past Surgical History  Procedure Laterality Date  . Tee without cardioversion  11/19/2011    Procedure: TRANSESOPHAGEAL ECHOCARDIOGRAM (TEE);  Surgeon: [name], MD;  Location: [facility] ENDOSCOPY;  Service: Cardiovascular;  Laterality: N/A;  . Knee surgery  2009    ,Total knee replacement  . Bunionectomy  11/29/1988    bilateral with hammer toes  . Left rotator cuff    . Cesarean section    . Left heart catheterization with coronary angiogram N/A 08/14/2011    Procedure: LEFT HEART CATHETERIZATION WITH CORONARY ANGIOGRAM;  Surgeon: [name], MD;  Location: [facility] CATH LAB;  Service: Cardiovascular;  Laterality: N/A;  . Appendectomy  07/10/1957  . Cesarean section  05/1968, 07/1965  . Mastectomy  10/19/1998    Radical, left -with lymph nodes (8 total)  . Reverse bunionectomy  08/29/1993    removal of Tibial Sesamoid-left  . Joint replacement  05/2001    left knee  . Joint replacement  11/2001    right knee  . Joint replacement  01/2008    redo right knee  . Biopsy thyroid  12/01/2013    ultrasound  . Cardiac catheterization  07/2010    , Without significant CAD  . Cardiac catheterization  07/2011    [name]  . Total hip arthroplasty Left 10/11/2014    Procedure: LEFT TOTAL HIP ARTHROPLASTY ANTERIOR APPROACH;  Surgeon: [name], MD;  Location: [facility] ORS;  Service: Orthopedics;  Laterality: Left;  Marland Kitchen Eye surgery  01/31/2013    eye lid; cataract surgery bilat   . Breast surgery    . Tonsillectomy    . Thyroidectomy N/A 03/02/2015    Procedure: TOTAL THYROIDECTOMY;  Surgeon: Armandina Gemma, MD;  Location: [facility] ORS;  Service: General;  Laterality: N/A;    Current Outpatient Prescriptions  Medication Sig Dispense Refill  . albuterol (PROVENTIL HFA;VENTOLIN HFA) 108 (90 BASE) MCG/ACT inhaler Inhale 2 puffs into the lungs every 4 (four) hours as needed for wheezing or  shortness of breath. 1 Inhaler 1  . amLODipine (NORVASC) 5 MG tablet Take 5 mg by mouth daily.    Marland Kitchen apixaban (ELIQUIS) 5 MG TABS tablet Take 1 tablet (5 mg total) by mouth 2 (two) times daily. 180 tablet 1  . atorvastatin (LIPITOR) 10 MG tablet Take 10 mg by mouth daily after supper.     . fenofibrate 160 MG tablet Take 160 mg by mouth daily after supper.     . ferrous sulfate 325 (65 FE) MG EC tablet Take 325 mg by mouth daily after supper.     Marland Kitchen ketoconazole (NIZORAL) 2 % cream Reported on 05/02/2015  0  . losartan (COZAAR) 100 MG tablet Take 100 mg by mouth daily.    . Multiple Vitamin (MULTIVITAMIN  WITH MINERALS) TABS tablet Take 1 tablet by mouth daily.    . Omega-3 Fatty Acids (FISH OIL PO) Take 1,960 mg by mouth 2 (two) times daily. 980 mg EPA-DHA per capsule    . propranolol (INDERAL) 60 MG tablet Take 2 tablets in the morning, 1 in the afternoon, and 1 in the evening. (Patient taking differently: Take 60-120 mg by mouth 3 (three) times daily. Take 2 tablets (120 mg) by mouth after breakfast, take 1 tablet (60 mg) after lunch and supper) 360 tablet 3  . ranitidine (ZANTAC) 150 MG tablet Take 150 mg by mouth daily after lunch.    . SYNTHROID 100 MCG tablet Take 1 tablet (100 mcg total) by mouth daily before breakfast. 45 tablet 1  . Tiotropium Bromide Monohydrate (SPIRIVA RESPIMAT) 2.5 MCG/ACT AERS Inhale 2 puffs into the lungs daily. 3 Inhaler 1   No current facility-administered medications for this visit.    Allergies:    Allergies  Allergen Reactions  . Floxin [Ofloxacin] Nausea Only and Other (See Comments)    Dizziness, Vertigo    Social History:  The patient  reports that she quit smoking about 23 years ago. Her smoking use included Cigarettes. She has a 60 pack-year smoking history. She has never used smokeless tobacco. She reports that she drinks about 0.6 oz of alcohol per week. She reports that she does not use illicit drugs.   ROS:  Please see the history of present  illness.   No syncope, no bleeding, no orthopnea, no PND, no further strokelike symptoms   All other systems reviewed and negative.   PHYSICAL EXAM: VS:  BP 124/72 mmHg  Pulse 82  Ht 5\' 6"  (1.676 m)  Wt 259 lb 12.8 oz (117.845 kg)  BMI 41.95 kg/m2 Well nourished, well developed, in no acute distress HEENT: normal Neck: no JVD Cardiac:  irreg normal rate S1, S2; RRR; no murmurdistant heart sounds Lungs:  clear to auscultation bilaterally, no wheezing, rhonchi or rales Abd: soft, nontender, no hepatomegalyobese Ext: no edema Skin: warm and dry Neuro: no focal abnormalities noted  tremor  EKG: 07/31/14-76, atrial fibrillation-no significant change from prior. AFIB 77 No ST changes.  ASSESSMENT AND PLAN:  75 year old with atrial fibrillation, prior stroke on chronic anticoagulation with Eliquis here for followup.  1. Cardiac catheterization in 2013 was overall reassuring. 2. Atrial fibrillation, persistent- Currently on Inderal, rate control. If she has a rapid ventricular response episode, I've instructed her to take an extra Inderal tablet. We will send this prescription to [facility] at her request. Watch for signs of rapid ventricular response during hip surgery. 3. Hyperlipidemia-currently on Lipitor 10 mg. LDL 79 at last check. Dr. Rex Kras has been monitoring lipids 4. Chronic anticoagulation-no bleeding. Continue to monitor CBC/creatinine every 6 months. Creatinine 0.69 and hemoglobin 13.4 on 11/12/13. 5. Chest pain - GERD. Doing well. Prior Cath reassuring. Zantac. Helping. Went to ER 04/2015. Reassurance. 6. Obesity-continue to encourage weight loss 7. Chronic diastolic heart failure-shortness of breath. May be playing a role. Also prior smoking history as well.  8. HTN - excellent blood pressure today. Medications reviewed. Amlodipine has been added. She's been doing very well with this.  9. Status post thyroidectomy. Dr. Harlow Asa, noncancerous thankfully. 10. Returning to clinic in 6  months.  Signed, [name], MD [facility]  05/16/2015 3:04 PM

## 2015-05-17 ENCOUNTER — Other Ambulatory Visit: Payer: Self-pay

## 2015-05-17 DIAGNOSIS — Z1231 Encounter for screening mammogram for malignant neoplasm of breast: Secondary | ICD-10-CM

## 2015-05-17 DIAGNOSIS — Z9012 Acquired absence of left breast and nipple: Secondary | ICD-10-CM

## 2015-05-29 ENCOUNTER — Ambulatory Visit
Admission: RE | Admit: 2015-05-29 | Discharge: 2015-05-29 | Disposition: A | Discharge status: Home / Self Care | Payer: PPO | Source: Ambulatory Visit

## 2015-05-29 DIAGNOSIS — Z9012 Acquired absence of left breast and nipple: Secondary | ICD-10-CM

## 2015-05-29 DIAGNOSIS — Z1231 Encounter for screening mammogram for malignant neoplasm of breast: Secondary | ICD-10-CM

## 2015-05-31 ENCOUNTER — Other Ambulatory Visit: Payer: Self-pay | Admitting: Internal Medicine

## 2015-05-31 DIAGNOSIS — R928 Other abnormal and inconclusive findings on diagnostic imaging of breast: Secondary | ICD-10-CM

## 2015-06-04 ENCOUNTER — Encounter: Payer: Self-pay | Admitting: Emergency Medicine

## 2015-06-04 ENCOUNTER — Ambulatory Visit (INDEPENDENT_AMBULATORY_CARE_PROVIDER_SITE_OTHER): Payer: PPO | Admitting: Emergency Medicine

## 2015-06-04 VITALS — BP 124/76 | HR 50 | Ht 66.0 in | Wt 263.0 lb

## 2015-06-04 DIAGNOSIS — R938 Abnormal findings on diagnostic imaging of other specified body structures: Secondary | ICD-10-CM

## 2015-06-04 DIAGNOSIS — R9389 Abnormal findings on diagnostic imaging of other specified body structures: Secondary | ICD-10-CM

## 2015-06-04 DIAGNOSIS — J449 Chronic obstructive pulmonary disease, unspecified: Secondary | ICD-10-CM | POA: Diagnosis not present

## 2015-06-04 DIAGNOSIS — R490 Dysphonia: Secondary | ICD-10-CM

## 2015-06-04 NOTE — Progress Notes (Signed)
Subjective:    Patient ID: Kristin Hood, female    DOB: 01/25/1941, 75 y.o.   MRN: [id number]  Shortness of Breath Pertinent negatives include no ear pain, fever, headaches, leg swelling, rash, rhinorrhea, sore throat, vomiting or wheezing.   75 year old woman, former smoker (60 pack years), with history of hypertension, atrial fibrillation, cerebrovascular disease, and breast cancer treated in 2001 w mastectomy, no XRT. She has history of some mediastinal lymphadenopathy and splenomegaly that has been followed by oncology in the aftermath of her breast cancer. She was just admitted to the hospital 10/2014 for . A CT scan of the chest was performed on 11/16/14 personally reviewed today. This showed no evidence of PE but some evidence for air trapping, tracheobronchomalacia, bronchitis. An echocardiogram was performed 8/19 that showed concentric left ventricular hypertrophy with an EF of 60-65%, moderately dilated left atrium, moderately dilated right atrium, mildly dilated right ventricle and an estimated PASP of 46 mmHg. She was treated with albuterol, was sent home w a script - seems to help her.  She sleeps on her L side with head on 2 pillows or in a recliner, does not really snore, feels well rested in the am. No witness apneas.   ROV 12/28/14 -- follow-up visit for dyspnea with a history of tobacco abuse, treated breast cancer with serial CT scans show mediastinal lymphadenopathy and splenomegaly that has been stable by serial CT scans. She also has some subtle evidence of groundglass versus air trapping on her most recent CT. She underwent full pulmonary function testing today that I have personally reviewed. This showed mixed obstruction and restriction with a normal TLC and normal diffusion capacity.  She has been using albuterol nebulizer prn, feels that it does help her when she exerts.   ROV 01/30/15 -- follow-up visit for COPD and some evidence for air trapping on an abnormal CT scan of the  chest that was performed 10/2014. At her last visit we started Spiriva - she could tell an improvement in her breathing. Unfortunately the cost was very high, > $250 for her part.   ROV 06/04/15 -- follow-up visit for history of tobacco use and COPD, history of mediastinal lymphadenopathy and splenomegaly on serial CT scans. She also has some subtle groundglass versus air trapping that we have followed by CT chest last in August 2016. At our last visit we stopped Spiriva and started her on Incruse.  Her insurance changed January and she is now back on Spiriva Respimat. Since last visit she had a thyroidectomy, turned out to be a benign lesion. She is still having some voice changes and hoarseness. She is having some allergy sx - usually in her throat, not so much from her nose. She has GERD sx even on her zantac. She has exertional SOB.     Review of Systems  Constitutional: Negative for fever and unexpected weight change.  HENT: Positive for congestion and postnasal drip. Negative for dental problem, ear pain, nosebleeds, rhinorrhea, sinus pressure, sneezing, sore throat and trouble swallowing.   Eyes: Negative for redness and itching.  Respiratory: Positive for shortness of breath. Negative for cough, chest tightness and wheezing.   Cardiovascular: Negative for palpitations and leg swelling.  Gastrointestinal: Negative for nausea and vomiting.  Genitourinary: Negative for dysuria.  Musculoskeletal: Negative for joint swelling.  Skin: Negative for rash.  Neurological: Negative for headaches.  Hematological: Does not bruise/bleed easily.  Psychiatric/Behavioral: Negative for dysphoric mood. The patient is not nervous/anxious.  Objective:   Physical Exam Filed Vitals:   06/04/15 1021 06/04/15 1022  BP:  124/76  Pulse:  50  Height: 5\' 6"  (1.676 m)   Weight: 263 lb (119.296 kg)   SpO2:  95%   Gen: Pleasant, well-nourished, in no distress,  normal affect  ENT: No lesions,  mouth  clear,  oropharynx clear, no postnasal drip  Neck: No JVD, no TMG, no carotid bruits  Lungs: No use of accessory muscles, no dullness to percussion, clear without rales or rhonchi  Cardiovascular: RRR, heart sounds normal, no murmur or gallops, no peripheral edema  Musculoskeletal: No deformities, no cyanosis or clubbing  Neuro: alert, non focal  Skin: Warm, no lesions or rashes     Assessment & Plan:  COPD (chronic obstructive pulmonary disease) Continue Spiriva 2 puffs once a day, albuterol as needed  Abnormal chest CT Old off on repeat CT scan of the chest at this time. We will discuss possibly repeating this at next visit  Hoarseness Suspect related to GERD and superimposed allergic rhinitis. Will initiate loratadine and change Zantac to omeprazole twice a day.

## 2015-06-04 NOTE — Assessment & Plan Note (Signed)
Old off on repeat CT scan of the chest at this time. We will discuss possibly repeating this at next visit

## 2015-06-04 NOTE — Assessment & Plan Note (Signed)
Continue Spiriva 2 puffs once a day, albuterol as needed

## 2015-06-04 NOTE — Assessment & Plan Note (Signed)
Suspect related to GERD and superimposed allergic rhinitis. Will initiate loratadine and change Zantac to omeprazole twice a day.

## 2015-06-04 NOTE — Patient Instructions (Addendum)
Please continue Spiriva 2 puffs once a day. Rinse and gargle after taking this medication.  Please stop zantac  Please start omeprazole 20mg  twice a day  Please start loratadine 10mg  daily We can consider adding flonase nasal spray in the future depending on how your cough and throat symptoms improve.  Follow with Dr [name] in 6 months or sooner if you have any problems

## 2015-06-06 ENCOUNTER — Other Ambulatory Visit: Payer: Self-pay

## 2015-06-06 ENCOUNTER — Encounter: Payer: Self-pay | Admitting: Emergency Medicine

## 2015-06-06 ENCOUNTER — Other Ambulatory Visit (INDEPENDENT_AMBULATORY_CARE_PROVIDER_SITE_OTHER): Payer: PPO

## 2015-06-06 ENCOUNTER — Other Ambulatory Visit: Payer: Self-pay | Admitting: Internal Medicine

## 2015-06-06 DIAGNOSIS — R928 Other abnormal and inconclusive findings on diagnostic imaging of breast: Secondary | ICD-10-CM

## 2015-06-06 DIAGNOSIS — E890 Postprocedural hypothyroidism: Secondary | ICD-10-CM | POA: Diagnosis not present

## 2015-06-06 LAB — TSH: TSH: 36.21 u[IU]/mL — AB (ref 0.35–4.50)

## 2015-06-06 LAB — T4, FREE: FREE T4: 1 ng/dL (ref 0.60–1.60)

## 2015-06-07 ENCOUNTER — Ambulatory Visit
Admission: RE | Admit: 2015-06-07 | Discharge: 2015-06-07 | Disposition: A | Discharge status: Home / Self Care | Payer: PPO | Source: Ambulatory Visit | Attending: Internal Medicine | Admitting: Internal Medicine

## 2015-06-07 ENCOUNTER — Other Ambulatory Visit: Payer: Self-pay | Admitting: Internal Medicine

## 2015-06-07 ENCOUNTER — Other Ambulatory Visit: Payer: Self-pay | Admitting: *Deleted

## 2015-06-07 DIAGNOSIS — R921 Mammographic calcification found on diagnostic imaging of breast: Secondary | ICD-10-CM | POA: Diagnosis not present

## 2015-06-07 DIAGNOSIS — R928 Other abnormal and inconclusive findings on diagnostic imaging of breast: Secondary | ICD-10-CM

## 2015-06-07 MED ORDER — LEVOTHYROXINE SODIUM 125 MCG PO TABS: 125.0000 ug | ORAL_TABLET | Freq: Every day | ORAL | Status: DC

## 2015-06-07 MED ORDER — SYNTHROID 125 MCG PO TABS: 125.0000 ug | ORAL_TABLET | Freq: Every day | ORAL | Status: DC

## 2015-06-07 NOTE — Telephone Encounter (Signed)
Dosage change per Dr Cruzita Lederer.

## 2015-06-08 ENCOUNTER — Encounter: Payer: Self-pay | Admitting: Emergency Medicine

## 2015-06-14 ENCOUNTER — Ambulatory Visit
Admission: RE | Admit: 2015-06-14 | Discharge: 2015-06-14 | Disposition: A | Discharge status: Home / Self Care | Payer: PPO | Source: Ambulatory Visit | Attending: Internal Medicine | Admitting: Internal Medicine

## 2015-06-14 ENCOUNTER — Other Ambulatory Visit: Payer: Self-pay | Admitting: Internal Medicine

## 2015-06-14 DIAGNOSIS — R928 Other abnormal and inconclusive findings on diagnostic imaging of breast: Secondary | ICD-10-CM

## 2015-06-14 DIAGNOSIS — D241 Benign neoplasm of right breast: Secondary | ICD-10-CM | POA: Diagnosis not present

## 2015-06-14 DIAGNOSIS — R921 Mammographic calcification found on diagnostic imaging of breast: Secondary | ICD-10-CM | POA: Diagnosis not present

## 2015-06-26 ENCOUNTER — Encounter: Payer: Self-pay | Admitting: Neurology

## 2015-06-26 ENCOUNTER — Ambulatory Visit (INDEPENDENT_AMBULATORY_CARE_PROVIDER_SITE_OTHER): Payer: PPO | Admitting: Neurology

## 2015-06-26 VITALS — BP 110/74 | HR 87 | Ht 66.0 in | Wt 266.0 lb

## 2015-06-26 DIAGNOSIS — I6529 Occlusion and stenosis of unspecified carotid artery: Secondary | ICD-10-CM

## 2015-06-26 NOTE — Patient Instructions (Signed)
I had a long discussion with the patient regarding her remote stroke and she seems to doing quite well and has been stable from neurovascular standpoint for nearly 4 years. Continue eliquis for secondary stroke prevention and strict control of hypertension with blood pressure goal below 130/90 and lipids with LDL cholesterol goal below 70 mg percent. Continue Inderal LA 120 mg for essential hand and voice tremor which also appears stable. Check screening carotid ultrasound study. No routine follow-up appointment is necessary as she has been quite stable for several years.

## 2015-06-26 NOTE — Progress Notes (Signed)
[facility] [address] [place]. [place] [address] [phone]       OFFICE FOLLOW-UP NOTE  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 She returns for followup her last visit on 03/12/2012. She continues to do well from the stroke standpoint and has not had any recurrent stroke or TIA symptoms. She is tolerating clopidogrel quite well without significant bleeding or bruising. She states her blood pressure is quite well controlled and today it is 130/80 in our office. She eats healthy and exercises regularly and her weight has been quite stable. She plans to have followup lipid profile checked in her primary physician's office next month. She continues to have left hand and voice tremor intermittently and has been taking Inderal 60 mg 2 tablets in the morning one in the afternoon and 1 he had night with modest benefit. She does have a very strong family history of tremors on her father's side. Update 06/16/2014 : She returns for follow-up after last visit in April 2014. She missed her yearly follow-up appointment and had trouble rescheduling. She ran out of Inderal but managed to get her cardiologist Dr. Gypsy Balsam to prescribe it. She continues to have voice and intermittent hand tremor which is mainly worse when she is emotionally upset or tired. She is currently taking Inderal LA 120 twice daily and tolerating it well without side effects. She was found to have paroxysmal atrial fibrillation in May 2014 by a cardiologist and change from Plavix to eliquis. She is tolerating it well without bleeding or bruising. She is planning on having left hip replacement  in July this year. She has not had any recurrent stroke or TIA symptoms. She has not had a carotid Doppler for more than a year. She states her lipids under good control though I do not have the latest results. Update 06/26/2015: She returns for follow-up after last visit 1 year ago. She remains stable from stroke standpoint without recurrent stroke or TIA symptoms now for 4 years. She is tolerating eliquis well without significant bleeding or bruising. She has undergone breast biopsy as well as thyroid surgery for suspected malignancy and has done well. She is tolerating Lipitor well without muscle aches and pains and states her last lipid profile was satisfactory. Her blood pressure remains well controlled and today it is 110/79. She's remains on Inderal LA 120 mg daily and feels that her hand tremor is almost gone. Her handwriting is much better. She still has intermittent voice tremor but is stable. She has no new complaints. She did have a follow-up carotid ultrasound done last year on 07/12/14  which showed no significant extracranial stenosis bilaterally. ROS:   14 system review of systems is positive for  , hand tremor,, voice tremor, shortness of breath, back pain and all other systems negative PMH:  Past Medical History  Diagnosis Date  . Hypertension     , With mild concentric left ventricular hypertrophy  . Shingles   . Atrial fibrillation ([place]) 07/2012    Eliquis, rate controlled  . Hx of adenomatous colonic polyps 2006    , Benign  . Mixed dyslipidemia   . Coarse tremors     ,  Essential  . Hypercholesteremia   . Breast cancer ([place]) 2001    left mastectomy  . Complication of anesthesia     SMALLER TUBE FOR INTUBATION AS GAGS ON TUBE  . Dysrhythmia 08/20/2012    Atrial Fibrillation  . Stroke ([place]) 11/17/2011    left side brain-speech-TPA  . CVA (cerebral vascular accident) ([place]) 2013  . Valvular sclerosis 09/23/2003    without stenosis  . History of chemotherapy 11/13/1998 to  2010    Adriamycin/Docotaxol, Dexorubicin/Taxotere, Femara, Tamoxifen  . Heart murmur   . Thyroid nodule   . Tremor, essential   . Family history of adverse reaction to anesthesia     mother experienced pain with intubation   . Anginal pain ([place])   . Varicose veins   . Spider veins   . COPD (chronic obstructive pulmonary disease) ([place])   . Shortness of breath dyspnea     on exertion   . Hard of hearing   . History of hiatal hernia     history of   . GERD (gastroesophageal reflux disease)     history of   . Arthritis     Social History:  Social History   Social History  . Marital Status: Married    Spouse Name: N/A  . Number of Children: N/A  . Years of Education: N/A   Occupational History  . retired    Social History Main Topics  . Smoking status: Former Smoker -- 2.00 packs/day for 30 years    Types: Cigarettes    Quit date: 01/30/1992  . Smokeless tobacco: Never Used  . Alcohol Use: 0.6 oz/week    1 Standard drinks or equivalent per week     Comment: wine occassionally  . Drug Use: No  . Sexual Activity: Not on file   Other Topics Concern  . Not on file   Social History Narrative    Medications:   Current Outpatient Prescriptions on File Prior to Visit  Medication Sig Dispense Refill  . albuterol (PROVENTIL HFA;VENTOLIN HFA) 108 (90 BASE) MCG/ACT inhaler Inhale 2 puffs into the lungs every 4 (four) hours as needed for wheezing or shortness of breath. 1 Inhaler 1  . amLODipine (NORVASC) 5 MG tablet Take 5 mg by mouth daily.    Marland Kitchen apixaban (ELIQUIS) 5 MG TABS tablet Take 1 tablet (5 mg total) by mouth 2 (two) times daily. 180 tablet 1  . atorvastatin (LIPITOR) 10 MG tablet Take 10 mg by mouth daily after supper.     . fenofibrate 160 MG tablet Take 160 mg by mouth daily after supper.     . ferrous sulfate 325 (65 FE) MG EC tablet Take 325 mg by mouth daily after supper.     Marland Kitchen ketoconazole (NIZORAL) 2 % cream Reported on 05/02/2015  0  . losartan (COZAAR) 100  MG tablet Take 100 mg by mouth daily.    . Multiple Vitamin (MULTIVITAMIN WITH MINERALS) TABS tablet Take 1 tablet by mouth daily.    . Omega-3 Fatty Acids (FISH OIL PO) Take 1,960 mg by mouth 2 (two) times daily. 980 mg EPA-DHA per capsule    . propranolol (INDERAL) 60 MG tablet Take 2 tablets in the morning, 1 in the afternoon, and 1 in the evening. 360 tablet 3  . ranitidine (ZANTAC) 150 MG tablet Take 150 mg by mouth daily after lunch.    . SYNTHROID 125 MCG tablet Take 1 tablet (125 mcg total) by mouth daily before breakfast. 45 tablet 2  .  Tiotropium Bromide Monohydrate (SPIRIVA RESPIMAT) 2.5 MCG/ACT AERS Inhale 2 puffs into the lungs daily. 3 Inhaler 1   No current facility-administered medications on file prior to visit.    Allergies:   Allergies  Allergen Reactions  . Floxin [Ofloxacin] Nausea Only and Other (See Comments)    Dizziness, Vertigo    Physical Exam General: well developed, well nourished elderly Caucasian lady, seated, in no evident distress Head: head normocephalic and atraumatic.  Neck: supple with no carotid or supraclavicular bruits Cardiovascular: regular rate and rhythm, no murmurs Musculoskeletal: no deformity Skin:  no rash/petichiae Vascular:  Normal pulses all extremities  Neurologic Exam Mental Status: Awake and fully alert. Oriented to place and time. Recent and remote memory intact. Attention span, concentration and fund of knowledge appropriate. Mood and affect appropriate.  Cranial Nerves: Fundoscopic exam not done  . Pupils equal, briskly reactive to light. Extraocular movements full without nystagmus. Visual fields full to confrontation. Hearing intact. Facial sensation intact. Face, tongue, palate moves normally and symmetrically.  Motor: Normal bulk and tone. Normal strength in all tested extremity muscles. Mild left upper extremity action tremor and intermittent voice tremor Sensory.: intact to tough and pinprick and vibratory.  Coordination:  Rapid alternating movements normal in all extremities. Finger-to-nose and heel-to-shin performed accurately bilaterally. Gait and Station: Arises from chair without difficulty. Stance is normal. Gait demonstrates normal stride length and balance . Able to heel, toe and tandem walk with mild difficulty.  Reflexes: 1+ and symmetric. Toes downgoing.     ASSESSMENT:  75 year old lady with embolic left MCA branch infarct in August 2013 with vascular risk factors of hypertension and hyperlipidemia. She also has benign essential left hand and voice tremor which has shown modest response to Inderal. She has remained quite stable from both stroke and essential tremor standpoint now for several years   PLAN: I had a long discussion with the patient regarding her remote stroke and she seems to doing quite well and has been stable from neurovascular standpoint for nearly 4 years. Continue eliquis for secondary stroke prevention and strict control of hypertension with blood pressure goal below 130/90 and lipids with LDL cholesterol goal below 70 mg percent. Continue Inderal LA 120 mg for essential hand and voice tremor which also appears stable. Check screening carotid ultrasound study. Greater than 50% time during this 25 minute visit was spent on counseling and coordination of care for stroke and essential tremor. No routine follow-up appointment is necessary as she has been quite stable for several years.   Antony Contras, MD

## 2015-07-05 ENCOUNTER — Ambulatory Visit (INDEPENDENT_AMBULATORY_CARE_PROVIDER_SITE_OTHER): Payer: PPO

## 2015-07-05 DIAGNOSIS — I6529 Occlusion and stenosis of unspecified carotid artery: Secondary | ICD-10-CM | POA: Diagnosis not present

## 2015-07-11 ENCOUNTER — Telehealth: Payer: Self-pay | Admitting: Neurology

## 2015-07-11 NOTE — Telephone Encounter (Signed)
Rn call patient back to give carotid ultrasound results. Rn stated the test was unremarkable and did not show any significant narrowing. No change from a year ago. Pt verbalized understanding. Patient stated Dr. Leonie Man told her she did not need to follow up. She only needed to follow as needed. Pt stated if he wanted her to have the test yearly or could her cardiologist order the test. Rn stated being that [place] cleared her to follow up if needed, she ask her cardiologist at the next appt if one is needed yearly. Pt verbalized understanding.

## 2015-07-11 NOTE — Telephone Encounter (Signed)
Pt requesting call about [place] results.

## 2015-07-16 ENCOUNTER — Other Ambulatory Visit (HOSPITAL_BASED_OUTPATIENT_CLINIC_OR_DEPARTMENT_OTHER): Payer: PPO

## 2015-07-16 ENCOUNTER — Ambulatory Visit (HOSPITAL_COMMUNITY)
Admission: RE | Admit: 2015-07-16 | Discharge: 2015-07-16 | Disposition: A | Discharge status: Home / Self Care | Payer: PPO | Source: Ambulatory Visit | Attending: Internal Medicine | Admitting: Internal Medicine

## 2015-07-16 ENCOUNTER — Encounter (HOSPITAL_COMMUNITY): Payer: Self-pay

## 2015-07-16 DIAGNOSIS — I2510 Atherosclerotic heart disease of native coronary artery without angina pectoris: Secondary | ICD-10-CM | POA: Diagnosis not present

## 2015-07-16 DIAGNOSIS — K8020 Calculus of gallbladder without cholecystitis without obstruction: Secondary | ICD-10-CM | POA: Diagnosis not present

## 2015-07-16 DIAGNOSIS — R911 Solitary pulmonary nodule: Secondary | ICD-10-CM | POA: Insufficient documentation

## 2015-07-16 DIAGNOSIS — R162 Hepatomegaly with splenomegaly, not elsewhere classified: Secondary | ICD-10-CM | POA: Insufficient documentation

## 2015-07-16 DIAGNOSIS — I7090 Unspecified atherosclerosis: Secondary | ICD-10-CM | POA: Diagnosis not present

## 2015-07-16 DIAGNOSIS — R590 Localized enlarged lymph nodes: Secondary | ICD-10-CM | POA: Diagnosis not present

## 2015-07-16 DIAGNOSIS — R591 Generalized enlarged lymph nodes: Secondary | ICD-10-CM

## 2015-07-16 LAB — CBC WITH DIFFERENTIAL/PLATELET
BASO%: 0.6 % (ref 0.0–2.0)
BASOS ABS: 0 10*3/uL (ref 0.0–0.1)
EOS%: 2.2 % (ref 0.0–7.0)
Eosinophils Absolute: 0.1 10*3/uL (ref 0.0–0.5)
HCT: 35.9 % (ref 34.8–46.6)
HEMOGLOBIN: 11.7 g/dL (ref 11.6–15.9)
LYMPH%: 9.5 % — AB (ref 14.0–49.7)
MCH: 29.1 pg (ref 25.1–34.0)
MCHC: 32.7 g/dL (ref 31.5–36.0)
MCV: 88.9 fL (ref 79.5–101.0)
MONO#: 0.5 10*3/uL (ref 0.1–0.9)
MONO%: 13.3 % (ref 0.0–14.0)
NEUT#: 3 10*3/uL (ref 1.5–6.5)
NEUT%: 74.4 % (ref 38.4–76.8)
Platelets: 249 10*3/uL (ref 145–400)
RBC: 4.04 10*6/uL (ref 3.70–5.45)
RDW: 14.4 % (ref 11.2–14.5)
WBC: 4.1 10*3/uL (ref 3.9–10.3)
lymph#: 0.4 10*3/uL — ABNORMAL LOW (ref 0.9–3.3)

## 2015-07-16 LAB — COMPREHENSIVE METABOLIC PANEL
ALT: 14 U/L (ref 0–55)
AST: 25 U/L (ref 5–34)
Albumin: 3.5 g/dL (ref 3.5–5.0)
Alkaline Phosphatase: 25 U/L — ABNORMAL LOW (ref 40–150)
Anion Gap: 10 mEq/L (ref 3–11)
BILIRUBIN TOTAL: 0.92 mg/dL (ref 0.20–1.20)
BUN: 22.2 mg/dL (ref 7.0–26.0)
CHLORIDE: 105 meq/L (ref 98–109)
CO2: 24 meq/L (ref 22–29)
Calcium: 9.8 mg/dL (ref 8.4–10.4)
Creatinine: 1.1 mg/dL (ref 0.6–1.1)
EGFR: 51 mL/min/{1.73_m2} — AB (ref >90)
GLUCOSE: 132 mg/dL (ref 70–140)
POTASSIUM: 4 meq/L (ref 3.5–5.1)
SODIUM: 140 meq/L (ref 136–145)
TOTAL PROTEIN: 8 g/dL (ref 6.4–8.3)

## 2015-07-16 LAB — LACTATE DEHYDROGENASE: LDH: 200 U/L (ref 125–245)

## 2015-07-16 MED ORDER — IOPAMIDOL (ISOVUE-300) INJECTION 61%
100.0000 mL | Freq: Once | INTRAVENOUS | Status: AC | PRN
  Administered 2015-07-16: 100 mL via INTRAVENOUS

## 2015-07-16 MED ORDER — DIATRIZOATE MEGLUMINE & SODIUM 66-10 % PO SOLN
30.0000 mL | Freq: Once | ORAL | Status: AC
  Administered 2015-07-16: 30 mL via ORAL

## 2015-07-18 ENCOUNTER — Other Ambulatory Visit: Payer: PPO

## 2015-07-20 ENCOUNTER — Other Ambulatory Visit (INDEPENDENT_AMBULATORY_CARE_PROVIDER_SITE_OTHER): Payer: PPO

## 2015-07-20 ENCOUNTER — Other Ambulatory Visit: Payer: Self-pay | Admitting: *Deleted

## 2015-07-20 DIAGNOSIS — E890 Postprocedural hypothyroidism: Secondary | ICD-10-CM

## 2015-07-20 LAB — TSH: TSH: 31.84 u[IU]/mL — ABNORMAL HIGH (ref 0.35–4.50)

## 2015-07-20 LAB — T3, FREE: T3 FREE: 1.9 pg/mL — AB (ref 2.3–4.2)

## 2015-07-20 LAB — T4, FREE: FREE T4: 1.04 ng/dL (ref 0.60–1.60)

## 2015-07-23 ENCOUNTER — Encounter: Payer: Self-pay | Admitting: Internal Medicine

## 2015-07-23 ENCOUNTER — Ambulatory Visit (HOSPITAL_BASED_OUTPATIENT_CLINIC_OR_DEPARTMENT_OTHER): Payer: PPO | Admitting: Internal Medicine

## 2015-07-23 ENCOUNTER — Telehealth: Payer: Self-pay | Admitting: Internal Medicine

## 2015-07-23 ENCOUNTER — Other Ambulatory Visit: Payer: Self-pay | Admitting: *Deleted

## 2015-07-23 VITALS — BP 138/79 | HR 80 | Temp 97.6°F | Resp 20 | Ht 66.0 in | Wt 264.1 lb

## 2015-07-23 DIAGNOSIS — R161 Splenomegaly, not elsewhere classified: Secondary | ICD-10-CM

## 2015-07-23 DIAGNOSIS — R590 Localized enlarged lymph nodes: Secondary | ICD-10-CM

## 2015-07-23 DIAGNOSIS — R05 Cough: Secondary | ICD-10-CM | POA: Diagnosis not present

## 2015-07-23 DIAGNOSIS — R591 Generalized enlarged lymph nodes: Secondary | ICD-10-CM

## 2015-07-23 MED ORDER — SYNTHROID 137 MCG PO TABS: 137.0000 ug | ORAL_TABLET | Freq: Every day | ORAL | Status: DC

## 2015-07-23 NOTE — Telephone Encounter (Signed)
Gave pt appt & avs °

## 2015-07-23 NOTE — Progress Notes (Signed)
[facility] [phone]   Fax:([phone]) [place], [place] [address]  DIAGNOSIS: Persistent lymphadenopathy in the mediastinum and retroperitoneum as well as splenomegaly suspicious for lymphoproliferative disorder but has been stable for the last 20 months. 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  PRIOR THERAPY: None.  CURRENT THERAPY: Observation.  INTERVAL HISTORY: Kristin Hood 75 y.o. female returns to the clinic today for annual follow-up visit accompanied by her husband. The patient is feeling fine today with no specific complaints except for mild productive cough. She continues to have fatigue. She is working with her primary care physician in adjusting her thyroid medicine. She denied having any significant weight loss or night sweats. She has no chest pain but has shortness breath with exertion with no hemoptysis. No significant fever or chills. The patient had repeat CT scan of the chest, abdomen and pelvis performed recently and she is here for evaluation and discussion of her scan results.  MEDICAL HISTORY: Past Medical History  Diagnosis Date  . Hypertension     , With mild concentric left ventricular hypertrophy  . Shingles   . Atrial fibrillation ([place]) 07/2012    Eliquis, rate controlled  . Hx of adenomatous colonic polyps 2006    , Benign  . Mixed dyslipidemia   . Coarse tremors     , Essential  . Hypercholesteremia   . Breast cancer ([place]) 2001    left mastectomy  . Complication of anesthesia     SMALLER TUBE FOR INTUBATION AS GAGS ON TUBE  . Dysrhythmia 08/20/2012    Atrial Fibrillation  . Stroke ([place]) 11/17/2011    left side brain-speech-TPA  . CVA (cerebral vascular accident) ([place]) 2013  . Valvular sclerosis 09/23/2003    without stenosis  . History of chemotherapy  11/13/1998 to 2010    Adriamycin/Docotaxol, Dexorubicin/Taxotere, Femara, Tamoxifen  . Heart murmur   . Thyroid nodule   . Tremor, essential   . Family history of adverse reaction to anesthesia     mother experienced pain with intubation   . Anginal pain ([place])   . Varicose veins   . Spider veins   . COPD (chronic obstructive pulmonary disease) ([place])   . Shortness of breath dyspnea     on exertion   . Hard of hearing   . History of hiatal hernia     history of   . GERD (gastroesophageal reflux disease)     history of   . Arthritis     ALLERGIES:  is allergic to floxin.  MEDICATIONS:  Current Outpatient Prescriptions  Medication Sig Dispense Refill  . albuterol (PROVENTIL HFA;VENTOLIN HFA) 108 (90 BASE) MCG/ACT inhaler Inhale 2 puffs into the lungs every 4 (four) hours as needed for wheezing or shortness of breath. 1 Inhaler 1  . amLODipine (NORVASC) 5 MG tablet Take 5 mg by mouth daily.    Marland Kitchen apixaban (ELIQUIS) 5 MG TABS tablet Take 1 tablet (5 mg total) by mouth 2 (two) times daily. 180 tablet 1  . atorvastatin (LIPITOR) 10 MG tablet Take 10 mg by mouth daily after supper.     . fenofibrate 160 MG tablet Take 160 mg by mouth daily after supper.     . ferrous sulfate 325 (65 FE) MG EC tablet Take 325 mg by mouth daily after supper.     Marland Kitchen  ketoconazole (NIZORAL) 2 % cream Reported on 05/02/2015  0  . losartan (COZAAR) 100 MG tablet Take 100 mg by mouth daily.    . Multiple Vitamin (MULTIVITAMIN WITH MINERALS) TABS tablet Take 1 tablet by mouth daily.    . Omega-3 Fatty Acids (FISH OIL PO) Take 1,960 mg by mouth 2 (two) times daily. 980 mg EPA-DHA per capsule    . propranolol (INDERAL) 60 MG tablet Take 2 tablets in the morning, 1 in the afternoon, and 1 in the evening. 360 tablet 3  . ranitidine (ZANTAC) 150 MG tablet Take 150 mg by mouth daily after lunch.    . SYNTHROID 125 MCG tablet Take 1 tablet (125 mcg total) by mouth daily before breakfast. 45 tablet 2  . Tiotropium Bromide  Monohydrate (SPIRIVA RESPIMAT) 2.5 MCG/ACT AERS Inhale 2 puffs into the lungs daily. 3 Inhaler 1   No current facility-administered medications for this visit.    SURGICAL HISTORY:  Past Surgical History  Procedure Laterality Date  . Tee without cardioversion  11/19/2011    Procedure: TRANSESOPHAGEAL ECHOCARDIOGRAM (TEE);  Surgeon: [name], MD;  Location: [facility] ENDOSCOPY;  Service: Cardiovascular;  Laterality: N/A;  . Knee surgery  2009    ,Total knee replacement  . Bunionectomy  11/29/1988    bilateral with hammer toes  . Left rotator cuff    . Cesarean section    . Left heart catheterization with coronary angiogram N/A 08/14/2011    Procedure: LEFT HEART CATHETERIZATION WITH CORONARY ANGIOGRAM;  Surgeon: [name], MD;  Location: [facility] CATH LAB;  Service: Cardiovascular;  Laterality: N/A;  . Appendectomy  07/10/1957  . Cesarean section  05/1968, 07/1965  . Mastectomy  10/19/1998    Radical, left -with lymph nodes (8 total)  . Reverse bunionectomy  08/29/1993    removal of Tibial Sesamoid-left  . Joint replacement  05/2001    left knee  . Joint replacement  11/2001    right knee  . Joint replacement  01/2008    redo right knee  . Biopsy thyroid  12/01/2013    ultrasound  . Cardiac catheterization  07/2010    , Without significant CAD  . Cardiac catheterization  07/2011    [name]  . Total hip arthroplasty Left 10/11/2014    Procedure: LEFT TOTAL HIP ARTHROPLASTY ANTERIOR APPROACH;  Surgeon: [name], MD;  Location: [facility] ORS;  Service: Orthopedics;  Laterality: Left;  Marland Kitchen Eye surgery  01/31/2013    eye lid; cataract surgery bilat   . Breast surgery    . Tonsillectomy    . Thyroidectomy N/A 03/02/2015    Procedure: TOTAL THYROIDECTOMY;  Surgeon: Armandina Gemma, MD;  Location: [facility] ORS;  Service: General;  Laterality: N/A;    REVIEW OF SYSTEMS:  A comprehensive review of systems was negative except for: Constitutional: positive for fatigue Respiratory: positive for dyspnea on  exertion   PHYSICAL EXAMINATION: General appearance: alert, cooperative, fatigued and no distress Head: Normocephalic, without obvious abnormality, atraumatic Neck: no adenopathy, no JVD, supple, symmetrical, trachea midline and thyroid not enlarged, symmetric, no tenderness/mass/nodules Lymph nodes: Cervical, supraclavicular, and axillary nodes normal. Resp: clear to auscultation bilaterally Back: symmetric, no curvature. ROM normal. No CVA tenderness. Cardio: regular rate and rhythm, S1, S2 normal, no murmur, click, rub or gallop GI: soft, non-tender; bowel sounds normal; no masses,  no organomegaly Extremities: extremities normal, atraumatic, no cyanosis or edema  ECOG PERFORMANCE STATUS: 1 - Symptomatic but completely ambulatory  Blood pressure 138/79, pulse 80, temperature 97.6 F (  36.4 C), temperature source Oral, resp. rate 20, height 5\' 6"  (1.676 m), weight 264 lb 1.6 oz (119.795 kg), SpO2 98 %.  LABORATORY DATA: Lab Results  Component Value Date   WBC 4.1 07/16/2015   HGB 11.7 07/16/2015   HCT 35.9 07/16/2015   MCV 88.9 07/16/2015   PLT 249 07/16/2015      Chemistry      Component Value Date/Time   NA 140 07/16/2015 0911   NA 139 04/06/2015 1914   K 4.0 07/16/2015 0911   K 4.7 04/06/2015 1914   CL 106 04/06/2015 1914   CO2 24 07/16/2015 0911   CO2 21* 04/06/2015 1914   BUN 22.2 07/16/2015 0911   BUN 21* 04/06/2015 1914   CREATININE 1.1 07/16/2015 0911   CREATININE 0.94 04/06/2015 1914      Component Value Date/Time   CALCIUM 9.8 07/16/2015 0911   CALCIUM 10.5* 04/06/2015 1914   ALKPHOS 25* 07/16/2015 0911   ALKPHOS 35* 10/04/2014 1510   AST 25 07/16/2015 0911   AST 32 10/04/2014 1510   ALT 14 07/16/2015 0911   ALT 22 10/04/2014 1510   BILITOT 0.92 07/16/2015 0911   BILITOT 1.0 10/04/2014 1510       RADIOGRAPHIC STUDIES: Ct Chest W Contrast  07/16/2015  CLINICAL DATA:  Staging of lymphadenopathy. Left breast cancer, chemotherapy complete. Oral  chemotherapy complete in 2010. Recent right-sided needle localization with benign results. Chest pain/pressure. Short of breath. COPD. Hoarseness. History of "Spot on lung" . EXAM: CT CHEST, ABDOMEN, AND PELVIS WITH CONTRAST TECHNIQUE: Multidetector CT imaging of the chest, abdomen and pelvis was performed following the standard protocol during bolus administration of intravenous contrast. CONTRAST:  197mL ISOVUE-300 IOPAMIDOL (ISOVUE-300) INJECTION 61% COMPARISON:  Chest radiographs have 04/06/2015. Chest CT 11/16/2014. Chest abdomen and pelvic CTs of 06/21/2014. FINDINGS: CT CHEST FINDINGS Mediastinum/Lymph Nodes: low left jugular/ supraclavicular adenopathy, including at 9 mm on image 8/series 2. 7 mm on the prior. Aortic and branch vessel atherosclerosis. Moderate cardiomegaly with multivessel coronary artery atherosclerosis. No pericardial effusion. No central pulmonary embolism, on this non-dedicated study. Mediastinal adenopathy. Subcarinal node measures 1.8 cm on image 29/series 2. Compare 1.4 cm on the prior. Calcified right hilar node is likely related to old granulomatous disease. Left infrahilar node measures 10 mm on image 30/series 2 and is unchanged. Periesophageal adenopathy including at 2.8 cm on image 44/series 2. Compare 2.1 cm on the prior. A tiny hiatal hernia. Preaortic nodes are upper normal size, but increased since the prior. Example 7 mm. Lungs/Pleura: No pleural fluid. Mosaic attenuation is slightly increased since the prior exam, favored to be related to hypoventilation and resultant air trapping. Left upper lobe pulmonary nodule measures 6 mm, including on image 76/series 4. Musculoskeletal: No acute osseous abnormality. Left mastectomy. Right subpectoral node is mildly enlarged at 8 mm today versus 6 mm on the prior (when remeasured). CT ABDOMEN PELVIS FINDINGS Hepatobiliary: Hepatomegaly, 19.5 cm craniocaudal gallstones on the order of 5 mm without acute cholecystitis or biliary duct  dilatation. Pancreas: Partial fatty atrophy involving the pancreas. Spleen: Splenomegaly, 16.2 cm craniocaudal. Similar. Old granulomatous disease within. Adrenals/Urinary Tract: Normal adrenal glands. Interpolar right renal too small to characterize lesion. Normal left kidney, without hydronephrosis. Degraded evaluation of the pelvis, secondary to beam hardening artifact from left hip arthroplasty. Grossly normal urinary bladder. Stomach/Bowel: Normal remainder of the stomach. Scattered colonic diverticula. Colonic stool burden suggests constipation. Normal terminal ileum. Normal small bowel. Vascular/Lymphatic: Aortic and branch vessel atherosclerosis. Confluent retroperitoneal adenopathy.  Left periaortic nodal tissue measures maximally 2.1 cm on image 73/series 2. Compare 1.6 cm on the prior exam (when remeasured). Aortocaval adenopathy at 1.3 cm on image 79/series 2, similar. A portal caval node measures 1.7 cm today versus 1.8 cm on the prior. No pelvic sidewall adenopathy. Reproductive: Uterine calcification is likely related to a sub cm underlying fibroid. No adnexal mass. Other: No significant free fluid. There is trace perisplenic fluid which is similar. Musculoskeletal: Left hip arthroplasty. IMPRESSION: 1. Mild progression of thoracic, lower cervical, and upper abdominal adenopathy, consistent with progressive lymphoma. 2. Similar hepatosplenomegaly. 3. Similar left upper lobe pulmonary nodule. 4.  Atherosclerosis, including within the coronary arteries. 5. Cholelithiasis. Electronically Signed   By: Abigail Miyamoto M.D.   On: 07/16/2015 14:10   Ct Abdomen Pelvis W Contrast  07/16/2015  CLINICAL DATA:  Staging of lymphadenopathy. Left breast cancer, chemotherapy complete. Oral chemotherapy complete in 2010. Recent right-sided needle localization with benign results. Chest pain/pressure. Short of breath. COPD. Hoarseness. History of "Spot on lung" . EXAM: CT CHEST, ABDOMEN, AND PELVIS WITH CONTRAST  TECHNIQUE: Multidetector CT imaging of the chest, abdomen and pelvis was performed following the standard protocol during bolus administration of intravenous contrast. CONTRAST:  156mL ISOVUE-300 IOPAMIDOL (ISOVUE-300) INJECTION 61% COMPARISON:  Chest radiographs have 04/06/2015. Chest CT 11/16/2014. Chest abdomen and pelvic CTs of 06/21/2014. FINDINGS: CT CHEST FINDINGS Mediastinum/Lymph Nodes: low left jugular/ supraclavicular adenopathy, including at 9 mm on image 8/series 2. 7 mm on the prior. Aortic and branch vessel atherosclerosis. Moderate cardiomegaly with multivessel coronary artery atherosclerosis. No pericardial effusion. No central pulmonary embolism, on this non-dedicated study. Mediastinal adenopathy. Subcarinal node measures 1.8 cm on image 29/series 2. Compare 1.4 cm on the prior. Calcified right hilar node is likely related to old granulomatous disease. Left infrahilar node measures 10 mm on image 30/series 2 and is unchanged. Periesophageal adenopathy including at 2.8 cm on image 44/series 2. Compare 2.1 cm on the prior. A tiny hiatal hernia. Preaortic nodes are upper normal size, but increased since the prior. Example 7 mm. Lungs/Pleura: No pleural fluid. Mosaic attenuation is slightly increased since the prior exam, favored to be related to hypoventilation and resultant air trapping. Left upper lobe pulmonary nodule measures 6 mm, including on image 76/series 4. Musculoskeletal: No acute osseous abnormality. Left mastectomy. Right subpectoral node is mildly enlarged at 8 mm today versus 6 mm on the prior (when remeasured). CT ABDOMEN PELVIS FINDINGS Hepatobiliary: Hepatomegaly, 19.5 cm craniocaudal gallstones on the order of 5 mm without acute cholecystitis or biliary duct dilatation. Pancreas: Partial fatty atrophy involving the pancreas. Spleen: Splenomegaly, 16.2 cm craniocaudal. Similar. Old granulomatous disease within. Adrenals/Urinary Tract: Normal adrenal glands. Interpolar right renal  too small to characterize lesion. Normal left kidney, without hydronephrosis. Degraded evaluation of the pelvis, secondary to beam hardening artifact from left hip arthroplasty. Grossly normal urinary bladder. Stomach/Bowel: Normal remainder of the stomach. Scattered colonic diverticula. Colonic stool burden suggests constipation. Normal terminal ileum. Normal small bowel. Vascular/Lymphatic: Aortic and branch vessel atherosclerosis. Confluent retroperitoneal adenopathy. Left periaortic nodal tissue measures maximally 2.1 cm on image 73/series 2. Compare 1.6 cm on the prior exam (when remeasured). Aortocaval adenopathy at 1.3 cm on image 79/series 2, similar. A portal caval node measures 1.7 cm today versus 1.8 cm on the prior. No pelvic sidewall adenopathy. Reproductive: Uterine calcification is likely related to a sub cm underlying fibroid. No adnexal mass. Other: No significant free fluid. There is trace perisplenic fluid which is similar. Musculoskeletal: Left  hip arthroplasty. IMPRESSION: 1. Mild progression of thoracic, lower cervical, and upper abdominal adenopathy, consistent with progressive lymphoma. 2. Similar hepatosplenomegaly. 3. Similar left upper lobe pulmonary nodule. 4.  Atherosclerosis, including within the coronary arteries. 5. Cholelithiasis. Electronically Signed   By: Abigail Miyamoto M.D.   On: 07/16/2015 14:10    ASSESSMENT AND PLAN: This is a very pleasant 75 years old white female with persistent mediastinal lymphadenopathy and mild splenomegaly suspicious for low-grade lymphoma. The patient is doing fine today with no specific complaints. She denied having any significant weight loss or night sweats. Her recent CT scan of the chest, abdomen and pelvis showed mild increase in the thoracic, lower cervical and upper abdominal adenopathy. I discussed the scan results with the patient and her husband today. I recommended for her to continue on observation with repeat CT scan of the chest,  abdomen and pelvis in one year for evaluation of her disease. She was also advised to call immediately if she has any concerning symptoms in the interval. The patient voices understanding of current disease status and treatment options and is in agreement with the current care plan.  All questions were answered. The patient knows to call the clinic with any problems, questions or concerns. We can certainly see the patient much sooner if necessary.  Disclaimer: This note was dictated with voice recognition software. Similar sounding words can inadvertently be transcribed and may not be corrected upon review.

## 2015-07-25 DIAGNOSIS — J069 Acute upper respiratory infection, unspecified: Secondary | ICD-10-CM | POA: Diagnosis not present

## 2015-07-25 DIAGNOSIS — R490 Dysphonia: Secondary | ICD-10-CM | POA: Diagnosis not present

## 2015-08-18 ENCOUNTER — Encounter: Payer: Self-pay | Admitting: Internal Medicine

## 2015-08-20 ENCOUNTER — Other Ambulatory Visit: Payer: Self-pay | Admitting: Internal Medicine

## 2015-08-20 DIAGNOSIS — E890 Postprocedural hypothyroidism: Secondary | ICD-10-CM

## 2015-08-22 ENCOUNTER — Other Ambulatory Visit (INDEPENDENT_AMBULATORY_CARE_PROVIDER_SITE_OTHER): Payer: PPO

## 2015-08-22 DIAGNOSIS — E890 Postprocedural hypothyroidism: Secondary | ICD-10-CM

## 2015-08-22 LAB — T4, FREE: Free T4: 1 ng/dL (ref 0.60–1.60)

## 2015-08-22 LAB — TSH: TSH: 25.94 u[IU]/mL — ABNORMAL HIGH (ref 0.35–4.50)

## 2015-08-24 ENCOUNTER — Other Ambulatory Visit: Payer: Self-pay | Admitting: *Deleted

## 2015-08-24 MED ORDER — SYNTHROID 150 MCG PO TABS: 150.0000 ug | ORAL_TABLET | Freq: Every day | ORAL | Status: DC

## 2015-08-30 ENCOUNTER — Ambulatory Visit: Payer: PPO | Admitting: Internal Medicine

## 2015-09-03 ENCOUNTER — Ambulatory Visit (INDEPENDENT_AMBULATORY_CARE_PROVIDER_SITE_OTHER): Payer: PPO | Admitting: Internal Medicine

## 2015-09-03 ENCOUNTER — Encounter: Payer: Self-pay | Admitting: Internal Medicine

## 2015-09-03 VITALS — BP 130/72 | HR 79 | Temp 97.4°F | Resp 12 | Wt 270.0 lb

## 2015-09-03 DIAGNOSIS — E890 Postprocedural hypothyroidism: Secondary | ICD-10-CM | POA: Diagnosis not present

## 2015-09-03 NOTE — Progress Notes (Signed)
Patient ID: Kristin Hood, female   DOB: [birth year], 75 y.o.   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 Last visit 4 months ago.  Reviewed hx: She described having L-sided chest pressure (11/11/2013) >> went to the ED: found to be in A fib. Also had a Chest CT: Mediastinal and retroperitoneal lymph nodes, so a PET scan was recommended: a thyroid nodule appeared hypermetabolic.   PET scan (03/88/8280): hypermetabolic R thyroid nodule  Thyroid U/S (11/21/2013): R 2.7 x 1.8 cm nodule, heterogeneous, with coarse calcifications  R nodule Bx (12/01/2013): FLUS  11/30/2013 TSH: 6.84 (0.34-4.5).  R nodule Bx (07/11/2014): FLUS  07/31/2014: Called and d/w pt about the results of the Afirma test, which is "suspicious for neoplasm" (test results scanned under Media tab). This carries a risk of 75% for cancer. In this case, I suggested total thyroidectomy.   03/02/2015: Total thyroidectomy - Dr Harlow Asa >> benign pathology! 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  Reviewed TFTs: Lab Results  Component Value Date   TSH 25.94* 08/22/2015   TSH 31.84* 07/20/2015   TSH 36.21* 06/06/2015   TSH 4.02 12/11/2014   FREET4 1.00 08/22/2015   FREET4 1.04 07/20/2015   FREET4 1.00 06/06/2015   FREET4 1.13 12/11/2014  04/11/2015: TSH 40.5!  Pt denies feeling nodules in neck, + hoarseness, no dysphagia/odynophagia, SOB with lying down. Her voice is improved after the surgery.  Pt c/o: - + fatigue - + hoarseness - + tremors (hereditary) - since a teenager - no heat  intolerance/cold intolerance - no palpitations - no anxiety/no depression - no hyperdefecation/no constipation - no weight loss/no gain - no dry skin - no hair falling  Pt does have a FH of thyroid ds.: mother (hypothyroidism). No FH of thyroid cancer. No h/o radiation tx to head or neck. She had ChTx for her BrCA - dx 2000.  No seaweed or kelp, no recent contrast studies. No steroid use. No herbal supplements.   ROS: Constitutional: + see HPI Eyes: no blurry vision, no xerophthalmia ENT: no sore throat, see HPI Cardiovascular: no CP/+ exertional SOB/no palpitations/leg swelling Respiratory: no cough/+ exertional SOB Gastrointestinal: no N/V/D/C Musculoskeletal: no muscle/joint aches Skin: no rashes Neurological: + tremors/no numbness/tingling/dizziness  I reviewed pt's medications, allergies, PMH, social hx, family hx, and changes were documented in the history of present illness. Otherwise, unchanged from my initial visit note.  Past Medical History  Diagnosis Date  . Hypertension     , With mild concentric left ventricular hypertrophy  . Shingles   . Atrial fibrillation ([place]) 07/2012    Eliquis, rate controlled  . Hx of adenomatous colonic polyps 2006    , Benign  . Mixed dyslipidemia   . Coarse tremors     , Essential  . Hypercholesteremia   . Breast cancer ([place]) 2001    left mastectomy  . Complication of anesthesia     SMALLER TUBE FOR INTUBATION AS GAGS ON TUBE  . Dysrhythmia 08/20/2012    Atrial Fibrillation  . Stroke ([place]) 11/17/2011  left side brain-speech-TPA  . CVA (cerebral vascular accident) ([place]) 2013  . Valvular sclerosis 09/23/2003    without stenosis  . History of chemotherapy 11/13/1998 to 2010    Adriamycin/Docotaxol, Dexorubicin/Taxotere, Femara, Tamoxifen  . Heart murmur   . Thyroid nodule   . Tremor, essential   . Family history of adverse reaction to anesthesia     mother experienced pain with intubation   . Anginal pain ([place])   .  Varicose veins   . Spider veins   . COPD (chronic obstructive pulmonary disease) ([place])   . Shortness of breath dyspnea     on exertion   . Hard of hearing   . History of hiatal hernia     history of   . GERD (gastroesophageal reflux disease)     history of   . Arthritis    Past Surgical History  Procedure Laterality Date  . Tee without cardioversion  11/19/2011    Procedure: TRANSESOPHAGEAL ECHOCARDIOGRAM (TEE);  Surgeon: [name], MD;  Location: [facility] ENDOSCOPY;  Service: Cardiovascular;  Laterality: N/A;  . Knee surgery  2009    ,Total knee replacement  . Bunionectomy  11/29/1988    bilateral with hammer toes  . Left rotator cuff    . Cesarean section    . Left heart catheterization with coronary angiogram N/A 08/14/2011    Procedure: LEFT HEART CATHETERIZATION WITH CORONARY ANGIOGRAM;  Surgeon: [name], MD;  Location: [facility] CATH LAB;  Service: Cardiovascular;  Laterality: N/A;  . Appendectomy  07/10/1957  . Cesarean section  05/1968, 07/1965  . Mastectomy  10/19/1998    Radical, left -with lymph nodes (8 total)  . Reverse bunionectomy  08/29/1993    removal of Tibial Sesamoid-left  . Joint replacement  05/2001    left knee  . Joint replacement  11/2001    right knee  . Joint replacement  01/2008    redo right knee  . Biopsy thyroid  12/01/2013    ultrasound  . Cardiac catheterization  07/2010    , Without significant CAD  . Cardiac catheterization  07/2011    [name]  . Total hip arthroplasty Left 10/11/2014    Procedure: LEFT TOTAL HIP ARTHROPLASTY ANTERIOR APPROACH;  Surgeon: [name], MD;  Location: [facility] ORS;  Service: Orthopedics;  Laterality: Left;  Marland Kitchen Eye surgery  01/31/2013    eye lid; cataract surgery bilat   . Breast surgery    . Tonsillectomy    . Thyroidectomy N/A 03/02/2015    Procedure: TOTAL THYROIDECTOMY;  Surgeon: Armandina Gemma, MD;  Location: [facility] ORS;  Service: General;  Laterality: N/A;   History   Social History  . Marital Status: Married     Spouse Name: N/A    Number of Children: 2   Occupational History  . homemaker   Social History Main Topics  . Smoking status: Former Smoker -- 2.50 packs/day for 13 years    Types: Cigarettes    Quit date: 01/30/1992  . Smokeless tobacco: Never Used  . Alcohol Use:     3 drink(s) per week  . Drug Use: No   Current Outpatient Prescriptions on File Prior to Visit  Medication Sig Dispense Refill  . albuterol (PROVENTIL HFA;VENTOLIN HFA) 108 (90 BASE) MCG/ACT inhaler Inhale 2 puffs into the lungs every 4 (four) hours as needed for wheezing or shortness of breath. 1 Inhaler 1  . amLODipine (NORVASC) 5 MG tablet Take 5 mg by mouth daily.    Marland Kitchen apixaban (ELIQUIS) 5  MG TABS tablet Take 1 tablet (5 mg total) by mouth 2 (two) times daily. 180 tablet 1  . atorvastatin (LIPITOR) 10 MG tablet Take 10 mg by mouth daily after supper.     . fenofibrate 160 MG tablet Take 160 mg by mouth daily after supper.     . ferrous sulfate 325 (65 FE) MG EC tablet Take 325 mg by mouth daily after supper.     Marland Kitchen ketoconazole (NIZORAL) 2 % cream Reported on 05/02/2015  0  . losartan (COZAAR) 100 MG tablet Take 100 mg by mouth daily.    . Multiple Vitamin (MULTIVITAMIN WITH MINERALS) TABS tablet Take 1 tablet by mouth daily.    . Omega-3 Fatty Acids (FISH OIL PO) Take 1,960 mg by mouth 2 (two) times daily. 980 mg EPA-DHA per capsule    . propranolol (INDERAL) 60 MG tablet Take 2 tablets in the morning, 1 in the afternoon, and 1 in the evening. 360 tablet 3  . ranitidine (ZANTAC) 150 MG tablet Take 150 mg by mouth daily after lunch.    . SYNTHROID 150 MCG tablet Take 1 tablet (150 mcg total) by mouth daily before breakfast. 30 tablet 2  . Tiotropium Bromide Monohydrate (SPIRIVA RESPIMAT) 2.5 MCG/ACT AERS Inhale 2 puffs into the lungs daily. 3 Inhaler 1   No current facility-administered medications on file prior to visit.   Allergies  Allergen Reactions  . Floxin [Ofloxacin] Nausea Only and Other (See Comments)     Dizziness, Vertigo   Family History  Problem Relation Age of Onset  . Heart disease Mother   . Heart disease Father   . Heart failure Father    PE: BP 130/72 mmHg  Pulse 79  Temp(Src) 97.4 F (36.3 C) (Oral)  Resp 12  Wt 270 lb (122.471 kg)  SpO2 95% Body mass index is 43.6 kg/(m^2). Wt Readings from Last 3 Encounters:  09/03/15 270 lb (122.471 kg)  07/23/15 264 lb 1.6 oz (119.795 kg)  06/26/15 266 lb (120.657 kg)   Constitutional: obese, in NAD Eyes: PERRLA, EOMI, no exophthalmos ENT: moist mucous membranes; thyroidectomy scar healing, no swelling, pain, dysesthesia; no nodule palpatedno cervical lymphadenopathy Cardiovascular: RRR, No MRG Respiratory: CTA B Gastrointestinal: abdomen soft, NT, ND, BS+ Musculoskeletal: no deformities, strength intact in all 4;  Skin: moist, warm, no rashes Neurological: + tremor with outstretched hands, DTR 0/4 in B knees (previous sx)  ASSESSMENT: 1. Postoperative hypothyroidism - h/o R thyroid nodule, hypermetabolic on PET, FLUS x2 on Bx, Afirma + >> benign final pathology  PLAN: 1. 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 With all the above factors pointing towards thyroid cancer,I recommended surgery >> she had this 03/02/2015 and very surprisingly, the final pathology was benign. - she was started on Synthroid d.a.w. 88 g After the surgery, now on 150 mcg 2/2 uncontrolled hypothyroidism - discussed how to take it correctly: every day, with water, >30 minutes before breakfast, separated by >4 hours from acid reflux medications, calcium, iron, multivitamins. She is taking it correctly.  - we reviewed together her most recent TSH obtained ~10 days ago, which was still high, at 26 >> dose increased to 150 g daily. - will check TFTs in 5-6 weeks after the  above change. She will come back for labs. Return in about 6 months (around 03/04/2016).

## 2015-09-03 NOTE — Patient Instructions (Addendum)
Please come back for labs in 1 month.  Please continue Synthroid 150 mcg daily.  Take the thyroid hormone every day, with water, at least 30 minutes before breakfast, separated by at least 4 hours from: - acid reflux medications - calcium - iron - multivitamins  Please come back for a follow-up appointment in 6 months.

## 2015-10-03 ENCOUNTER — Other Ambulatory Visit (INDEPENDENT_AMBULATORY_CARE_PROVIDER_SITE_OTHER): Payer: PPO

## 2015-10-03 DIAGNOSIS — E890 Postprocedural hypothyroidism: Secondary | ICD-10-CM | POA: Diagnosis not present

## 2015-10-03 LAB — T4, FREE: FREE T4: 1.26 ng/dL (ref 0.60–1.60)

## 2015-10-03 LAB — TSH: TSH: 17.91 u[IU]/mL — ABNORMAL HIGH (ref 0.35–4.50)

## 2015-10-04 ENCOUNTER — Telehealth: Payer: Self-pay

## 2015-10-04 MED ORDER — LEVOTHYROXINE SODIUM 175 MCG PO TABS
175.0000 ug | ORAL_TABLET | Freq: Every day | ORAL | Status: DC
Start: 2015-10-04 — End: 2016-01-08

## 2015-10-04 NOTE — Telephone Encounter (Signed)
Called and spoke with patient about results, and sending in medications into the pharmacy. Patient stated she would call back to make an appointment as she was not at a calender to make an appointment. Patient also stated she would like to have these lab results sent to Dr.Kevin Little, with [facility] so he has a copy of them.

## 2015-10-10 ENCOUNTER — Other Ambulatory Visit: Payer: PPO

## 2015-10-19 DIAGNOSIS — Z471 Aftercare following joint replacement surgery: Secondary | ICD-10-CM | POA: Diagnosis not present

## 2015-10-19 DIAGNOSIS — Z96642 Presence of left artificial hip joint: Secondary | ICD-10-CM | POA: Diagnosis not present

## 2015-10-28 ENCOUNTER — Encounter: Payer: Self-pay | Admitting: Cardiology

## 2015-10-31 ENCOUNTER — Telehealth: Payer: Self-pay | Admitting: Cardiology

## 2015-10-31 ENCOUNTER — Other Ambulatory Visit: Payer: Self-pay

## 2015-10-31 MED ORDER — APIXABAN 5 MG PO TABS: 5.0000 mg | ORAL_TABLET | Freq: Two times a day (BID) | ORAL | 1 refills | Status: DC

## 2015-10-31 NOTE — Telephone Encounter (Signed)
Walk in pt form-sealed envelope dropped off-Pam back Thursday 8/3 will give to her then.

## 2015-11-01 ENCOUNTER — Other Ambulatory Visit: Payer: Self-pay | Admitting: *Deleted

## 2015-11-01 MED ORDER — APIXABAN 5 MG PO TABS: 5.0000 mg | ORAL_TABLET | Freq: Two times a day (BID) | ORAL | 3 refills | Status: DC

## 2015-11-02 ENCOUNTER — Telehealth: Payer: Self-pay

## 2015-11-02 NOTE — Telephone Encounter (Signed)
error 

## 2015-11-02 NOTE — Telephone Encounter (Signed)
Provider portion of Patient Assist  Application faxed to [place].

## 2015-11-08 ENCOUNTER — Other Ambulatory Visit: Payer: Self-pay | Admitting: Internal Medicine

## 2015-11-08 DIAGNOSIS — E890 Postprocedural hypothyroidism: Secondary | ICD-10-CM

## 2015-11-12 ENCOUNTER — Ambulatory Visit (INDEPENDENT_AMBULATORY_CARE_PROVIDER_SITE_OTHER): Payer: PPO | Admitting: Cardiology

## 2015-11-12 ENCOUNTER — Encounter: Payer: Self-pay | Admitting: Cardiology

## 2015-11-12 VITALS — BP 130/74 | HR 78 | Ht 66.0 in | Wt 265.4 lb

## 2015-11-12 DIAGNOSIS — I5032 Chronic diastolic (congestive) heart failure: Secondary | ICD-10-CM | POA: Diagnosis not present

## 2015-11-12 DIAGNOSIS — Z7901 Long term (current) use of anticoagulants: Secondary | ICD-10-CM

## 2015-11-12 DIAGNOSIS — I4819 Other persistent atrial fibrillation: Secondary | ICD-10-CM

## 2015-11-12 DIAGNOSIS — I481 Persistent atrial fibrillation: Secondary | ICD-10-CM

## 2015-11-12 DIAGNOSIS — E661 Drug-induced obesity: Secondary | ICD-10-CM | POA: Diagnosis not present

## 2015-11-12 NOTE — Patient Instructions (Signed)
Medication Instructions:  The current medical regimen is effective;  continue present plan and medications.  Follow-Up: Follow up in 6 months with Miguel Rota.  You will receive a letter in the mail 2 months before you are due.  Please call us when you receive this letter to schedule your follow up appointment.  If you need a refill on your cardiac medications before your next appointment, please call your pharmacy.  Thank you for choosing [facility]!!

## 2015-11-12 NOTE — Progress Notes (Signed)
Patient ID: Kristin Hood, female   DOB: September 14, 1940, 75 y.o.   MRN: [id number]      [address] [place], [place]  [address] Phone: [phone] Fax:  [phone]  Date:  11/12/2015   ID:  Kristin Hood, DOB Mar 25, 1941, MRN [id number]  PCP:  Gennette Pac, MD   History of Present Illness: Kristin Hood is a 75 y.o. female with atrial fibrillation discovered on 08/20/12, chronic anticoagulation, COPD, prior stroke, received TPA for cerebral artery occlusion (could not raise arm-8/13) here for followup. Lives next door to [facility] and [employer] [place]    Echocardiogram demonstrated mild to moderate left atrial enlargement, normal ejection fraction, mild aortic stenosis, MAC, mild mitral regurgitation.  Had 3 knee replacements.   She underwent catheterization on 08/14/11 which showed no evidence of coronary artery disease. LVEDP was 14 mm mercury.  Went to ER on 12/10/12 had a terrific pain in her chest. Lasted about hour at home BP 159/100, 3 times. Went on to ER. Was worried. Once she got to [facility] she quit hurting. Sent to ER. Wanted to keep her overnight. Did troponin did well. Did OK. Prob GERD.   Went again to [facility]. Afib increased rate. PET scan. 3 nodules one thyroid. Endocrine, no good sample.   Unfortunately, her son-in-law died 2 weeks after neck surgery from pulmonary embolism.  07/31/14 - Hip surgery. Dr. Wynelle Link. She has had extensive workup including oncology workup for nodules in her lungs, lymph nodes, thyroid nodule. She will be followed up on a yearly basis and oncology. Reassurance. Overall, no chest pain other than mild GERD at night after dinner. No syncope, no change in shortness of breath.  05/16/15-baseline shortness of breath. She did go to the emergency room because of centralized chest discomfort. Troponin was normal, EKG unremarkable. Thankfully she started taking Zantac and this was improved.  11/12/15-reassuring thyroid tissue upon  excision. No cancer. Shortness of breath continues. Baseline. On Symbicort. COPD. Eliquis has been expensive. Trying to get assistance.  Wt Readings from Last 3 Encounters:  11/12/15 265 lb 6.4 oz (120.4 kg)  09/03/15 270 lb (122.5 kg)  07/23/15 264 lb 1.6 oz (119.8 kg)     Past Medical History:  Diagnosis Date  . Anginal pain ([place])   . Arthritis   . Atrial fibrillation ([place]) 07/2012   Eliquis, rate controlled  . Breast cancer ([place]) 2001   left mastectomy  . Coarse tremors    , Essential  . Complication of anesthesia    SMALLER TUBE FOR INTUBATION AS GAGS ON TUBE  . COPD (chronic obstructive pulmonary disease) ([place])   . CVA (cerebral vascular accident) ([place]) 2013  . Dysrhythmia 08/20/2012   Atrial Fibrillation  . Family history of adverse reaction to anesthesia    mother experienced pain with intubation   . GERD (gastroesophageal reflux disease)    history of   . Hard of hearing   . Heart murmur   . History of chemotherapy 11/13/1998 to 2010   Adriamycin/Docotaxol, Dexorubicin/Taxotere, Femara, Tamoxifen  . History of hiatal hernia    history of   . Hx of adenomatous colonic polyps 2006   , Benign  . Hypercholesteremia   . Hypertension    , With mild concentric left ventricular hypertrophy  . Mixed dyslipidemia   . Shingles   . Shortness of breath dyspnea    on exertion   . Spider veins   . Stroke ([place]) 11/17/2011  left side brain-speech-TPA  . Thyroid nodule   . Tremor, essential   . Valvular sclerosis 09/23/2003   without stenosis  . Varicose veins     Past Surgical History:  Procedure Laterality Date  . APPENDECTOMY  07/10/1957  . BIOPSY THYROID  12/01/2013   ultrasound  . BREAST SURGERY    . BUNIONECTOMY  11/29/1988   bilateral with hammer toes  . CARDIAC CATHETERIZATION  07/2010   , Without significant CAD  . CARDIAC CATHETERIZATION  07/2011   [name]  . CESAREAN SECTION    . CESAREAN SECTION  05/1968, 07/1965  . EYE SURGERY  01/31/2013    eye lid; cataract surgery bilat   . JOINT REPLACEMENT  05/2001   left knee  . JOINT REPLACEMENT  11/2001   right knee  . JOINT REPLACEMENT  01/2008   redo right knee  . KNEE SURGERY  2009   ,Total knee replacement  . LEFT HEART CATHETERIZATION WITH CORONARY ANGIOGRAM N/A 08/14/2011   Procedure: LEFT HEART CATHETERIZATION WITH CORONARY ANGIOGRAM;  Surgeon: [name], MD;  Location: [facility] CATH LAB;  Service: Cardiovascular;  Laterality: N/A;  . Left Rotator Cuff    . MASTECTOMY  10/19/1998   Radical, left -with lymph nodes (8 total)  . Reverse bunionectomy  08/29/1993   removal of Tibial Sesamoid-left  . TEE WITHOUT CARDIOVERSION  11/19/2011   Procedure: TRANSESOPHAGEAL ECHOCARDIOGRAM (TEE);  Surgeon: [name], MD;  Location: [facility] ENDOSCOPY;  Service: Cardiovascular;  Laterality: N/A;  . THYROIDECTOMY N/A 03/02/2015   Procedure: TOTAL THYROIDECTOMY;  Surgeon: Armandina Gemma, MD;  Location: [facility] ORS;  Service: General;  Laterality: N/A;  . TONSILLECTOMY    . TOTAL HIP ARTHROPLASTY Left 10/11/2014   Procedure: LEFT TOTAL HIP ARTHROPLASTY ANTERIOR APPROACH;  Surgeon: [name], MD;  Location: [facility] ORS;  Service: Orthopedics;  Laterality: Left;    Current Outpatient Prescriptions  Medication Sig Dispense Refill  . albuterol (PROVENTIL HFA;VENTOLIN HFA) 108 (90 BASE) MCG/ACT inhaler Inhale 2 puffs into the lungs every 4 (four) hours as needed for wheezing or shortness of breath. 1 Inhaler 1  . amLODipine (NORVASC) 5 MG tablet Take 5 mg by mouth daily.    Marland Kitchen apixaban (ELIQUIS) 5 MG TABS tablet Take 1 tablet (5 mg total) by mouth 2 (two) times daily. 180 tablet 3  . atorvastatin (LIPITOR) 10 MG tablet Take 10 mg by mouth daily after supper.     . fenofibrate 160 MG tablet Take 160 mg by mouth daily after supper.     . ferrous sulfate 325 (65 FE) MG EC tablet Take 325 mg by mouth daily after supper.     Marland Kitchen ketoconazole (NIZORAL) 2 % cream Reported on 05/02/2015  0  . levothyroxine (SYNTHROID) 175 MCG  tablet Take 1 tablet (175 mcg total) by mouth daily before breakfast. 30 tablet 2  . losartan (COZAAR) 100 MG tablet Take 100 mg by mouth daily.    . Multiple Vitamin (MULTIVITAMIN WITH MINERALS) TABS tablet Take 1 tablet by mouth daily.    . Omega-3 Fatty Acids (FISH OIL PO) Take 1,960 mg by mouth 2 (two) times daily. 980 mg EPA-DHA per capsule    . propranolol (INDERAL) 60 MG tablet Take 2 tablets in the morning, 1 in the afternoon, and 1 in the evening. 360 tablet 3  . ranitidine (ZANTAC) 150 MG tablet Take 150 mg by mouth daily after lunch.    . Tiotropium Bromide Monohydrate (SPIRIVA RESPIMAT) 2.5 MCG/ACT AERS Inhale 2 puffs into  the lungs daily. 3 Inhaler 1   No current facility-administered medications for this visit.     Allergies:    Allergies  Allergen Reactions  . Floxin [Ofloxacin] Nausea Only and Other (See Comments)    Dizziness, Vertigo    Social History:  The patient  reports that she quit smoking about 23 years ago. Her smoking use included Cigarettes. She has a 60.00 pack-year smoking history. She has never used smokeless tobacco. She reports that she drinks about 0.6 oz of alcohol per week . She reports that she does not use drugs.   ROS:  Please see the history of present illness.   No syncope, no bleeding, no orthopnea, no PND, no further strokelike symptoms   All other systems reviewed and negative.   PHYSICAL EXAM: VS:  BP 130/74   Pulse 78   Ht 5\' 6"  (1.676 m)   Wt 265 lb 6.4 oz (120.4 kg)   SpO2 95%   BMI 42.84 kg/m  Well nourished, well developed, in no acute distress  HEENT: normal  Neck: no JVD  Cardiac:  irreg normal rate S1, S2; RRR; no murmur distant heart sounds Lungs:  clear to auscultation bilaterally, no wheezing, rhonchi or rales  Abd: soft, nontender, no hepatomegaly obese Ext: no edema  Skin: warm and dry  Neuro: no focal abnormalities noted  tremor  EKG: 07/31/14-76, atrial fibrillation-no significant change from prior. AFIB 77 No ST  changes.  ASSESSMENT AND PLAN:  75 year old with atrial fibrillation, prior stroke on chronic anticoagulation with Eliquis here for followup.  1. Cardiac catheterization in 2013 was overall reassuring. 2. Atrial fibrillation, persistent- Currently on Inderal, rate control. If she has a rapid ventricular response episode, I've instructed her to take an extra Inderal tablet. We will send this prescription to [facility] at her request. 3. Hyperlipidemia-currently on Lipitor 10 mg. LDL 79 at last check. Dr. Rex Kras has been monitoring lipids 4. Chronic anticoagulation-no bleeding. Continue to monitor CBC/creatinine every 6 months. Creatinine 0.69 and hemoglobin 13.4  5. Chest pain - GERD. Doing well. Prior Cath reassuring. Zantac. Helping. Went to ER 04/2015. Reassurance. 6. Obesity-continue to encourage weight loss. Early playing a role as well in her knee on exertion. COPD as well. 7. Chronic diastolic heart failure-shortness of breath. May be playing a role. Also prior smoking history as well.  8. HTN - excellent blood pressure today. Medications reviewed. Amlodipine has been added. She's been doing very well with this.  9. Status post thyroidectomy. Dr. Harlow Asa, noncancerous thankfully. 10. Returning to clinic in 6 months, APP, 12 months with me.   Signed, [name], MD [facility]  11/12/2015 4:16 PM

## 2015-11-16 DIAGNOSIS — K219 Gastro-esophageal reflux disease without esophagitis: Secondary | ICD-10-CM | POA: Diagnosis not present

## 2015-11-16 DIAGNOSIS — Z23 Encounter for immunization: Secondary | ICD-10-CM | POA: Diagnosis not present

## 2015-11-16 DIAGNOSIS — M19041 Primary osteoarthritis, right hand: Secondary | ICD-10-CM | POA: Diagnosis not present

## 2015-11-16 DIAGNOSIS — H6120 Impacted cerumen, unspecified ear: Secondary | ICD-10-CM | POA: Diagnosis not present

## 2015-11-16 DIAGNOSIS — E782 Mixed hyperlipidemia: Secondary | ICD-10-CM | POA: Diagnosis not present

## 2015-11-19 ENCOUNTER — Other Ambulatory Visit (INDEPENDENT_AMBULATORY_CARE_PROVIDER_SITE_OTHER): Payer: PPO

## 2015-11-19 DIAGNOSIS — E890 Postprocedural hypothyroidism: Secondary | ICD-10-CM | POA: Diagnosis not present

## 2015-11-19 LAB — TSH: TSH: 6.46 u[IU]/mL — ABNORMAL HIGH (ref 0.35–4.50)

## 2015-11-19 LAB — T4, FREE: Free T4: 1.55 ng/dL (ref 0.60–1.60)

## 2015-11-22 ENCOUNTER — Telehealth: Payer: Self-pay

## 2015-11-22 NOTE — Telephone Encounter (Signed)
Patient has been accepted into the [employer] assistance program for Eliquis. She is eligible till 03/30/2016.

## 2015-12-05 ENCOUNTER — Ambulatory Visit (INDEPENDENT_AMBULATORY_CARE_PROVIDER_SITE_OTHER): Payer: PPO | Admitting: Emergency Medicine

## 2015-12-05 ENCOUNTER — Encounter: Payer: Self-pay | Admitting: Emergency Medicine

## 2015-12-05 DIAGNOSIS — M25562 Pain in left knee: Secondary | ICD-10-CM | POA: Diagnosis not present

## 2015-12-05 DIAGNOSIS — J449 Chronic obstructive pulmonary disease, unspecified: Secondary | ICD-10-CM | POA: Diagnosis not present

## 2015-12-05 DIAGNOSIS — R591 Generalized enlarged lymph nodes: Secondary | ICD-10-CM

## 2015-12-05 DIAGNOSIS — S8002XA Contusion of left knee, initial encounter: Secondary | ICD-10-CM | POA: Diagnosis not present

## 2015-12-05 DIAGNOSIS — M25531 Pain in right wrist: Secondary | ICD-10-CM | POA: Diagnosis not present

## 2015-12-05 NOTE — Assessment & Plan Note (Signed)
Etiology unclear. I agree that the risks of biopsy or other intervention may outweigh the benefits at this point. She is asymptomatic. She will follow-up with Dr. [name] in one year with a CT scan to follow

## 2015-12-05 NOTE — Assessment & Plan Note (Signed)
Please continue your Spiriva daily as you have been taking it. We'll check on a Chiropractor program for this medication. Take albuterol 2 puffs up to every 4 hours if needed for shortness of breath, wheeze.  I agree with increasing your exercise slowly to boost your stamina. This will help with all of your medical issues.  You may want to try using mucinex 600mg  daily to see if this makes it easier for you to clear your mucous in the morning.  Follow with Dr [name] in 6 months or sooner if you have any problems

## 2015-12-05 NOTE — Patient Instructions (Addendum)
Please continue your Spiriva daily as you have been taking it. We'll check on a Chiropractor program for this medication. Take albuterol 2 puffs up to every 4 hours if needed for shortness of breath, wheeze.  Follow with Dr [name] in 1 year with a Ct scan of your chest  I agree with increasing your exercise slowly to boost your stamina. This will help with all of your medical issues.  You may want to try using mucinex 600mg  daily to see if this makes it easier for you to clear your mucous in the morning.  Follow with Dr [name] in 6 months or sooner if you have any problems

## 2015-12-05 NOTE — Progress Notes (Signed)
Subjective:    Patient ID: Kristin Hood, female    DOB: 05/23/1940, 75 y.o.   MRN: [id number]  Shortness of Breath  Pertinent negatives include no ear pain, fever, headaches, leg swelling, rash, rhinorrhea, sore throat, vomiting or wheezing.   75 year old woman, former smoker (60 pack years), with history of hypertension, atrial fibrillation, cerebrovascular disease, and breast cancer treated in 2001 w mastectomy, no XRT. She has history of some mediastinal lymphadenopathy and splenomegaly that has been followed by oncology in the aftermath of her breast cancer. She was just admitted to the hospital 10/2014 for . A CT scan of the chest was performed on 11/16/14 personally reviewed today. This showed no evidence of PE but some evidence for air trapping, tracheobronchomalacia, bronchitis. An echocardiogram was performed 8/19 that showed concentric left ventricular hypertrophy with an EF of 60-65%, moderately dilated left atrium, moderately dilated right atrium, mildly dilated right ventricle and an estimated PASP of 46 mmHg. She was treated with albuterol, was sent home w a script - seems to help her.  She sleeps on her L side with head on 2 pillows or in a recliner, does not really snore, feels well rested in the am. No witness apneas.   ROV 12/28/14 -- follow-up visit for dyspnea with a history of tobacco abuse, treated breast cancer with serial CT scans show mediastinal lymphadenopathy and splenomegaly that has been stable by serial CT scans. She also has some subtle evidence of groundglass versus air trapping on her most recent CT. She underwent full pulmonary function testing today that I have personally reviewed. This showed mixed obstruction and restriction with a normal TLC and normal diffusion capacity.  She has been using albuterol nebulizer prn, feels that it does help her when she exerts.   ROV 01/30/15 -- follow-up visit for COPD and some evidence for air trapping on an abnormal CT scan of  the chest that was performed 10/2014. At her last visit we started Spiriva - she could tell an improvement in her breathing. Unfortunately the cost was very high, > $250 for her part.   ROV 06/04/15 -- follow-up visit for history of tobacco use and COPD, history of mediastinal lymphadenopathy and splenomegaly on serial CT scans. She also has some subtle groundglass versus air trapping that we have followed by CT chest last in August 2016. At our last visit we stopped Spiriva and started her on Incruse.  Her insurance changed January and she is now back on Spiriva Respimat. Since last visit she had a thyroidectomy, turned out to be a benign lesion. She is still having some voice changes and hoarseness. She is having some allergy sx - usually in her throat, not so much from her nose. She has GERD sx even on her zantac. She has exertional SOB.    ROV 12/05/15 -- This follow-up visit for patient with history of COPD and mediastinal And abdominal lymphadenopathy noted on serial CT scans of the chest, Last 06/2015. This is been followed in oncology by Dr. [name]. The slow progression on her scans has been suggestive of possible low-grade lymphoma but she has remained asymptomatic. She is also followed for atrial fibrillation, diastolic CHF, hypertension.  She reports today that she is doing fairly well. She is working on increasing her exercise. Also notes that she can have exertional SOB that requires using albuterol - usually with stairs, heavier exertion. She is reliable with spiriva. She had the flu shot 11/16/15.  Review of Systems  Constitutional: Negative for fever and unexpected weight change.  HENT: Positive for congestion and postnasal drip. Negative for dental problem, ear pain, nosebleeds, rhinorrhea, sinus pressure, sneezing, sore throat and trouble swallowing.   Eyes: Negative for redness and itching.  Respiratory: Positive for shortness of breath. Negative for cough, chest tightness and  wheezing.   Cardiovascular: Negative for palpitations and leg swelling.  Gastrointestinal: Negative for nausea and vomiting.  Genitourinary: Negative for dysuria.  Musculoskeletal: Negative for joint swelling.  Skin: Negative for rash.  Neurological: Negative for headaches.  Hematological: Does not bruise/bleed easily.  Psychiatric/Behavioral: Negative for dysphoric mood. The patient is not nervous/anxious.        Objective:   Physical Exam Vitals:   12/05/15 1038 12/05/15 1039  BP:  124/76  Pulse:  80  SpO2:  97%  Weight: 267 lb (121.1 kg)   Height: 5\' 6"  (1.676 m)    Gen: Pleasant, well-nourished, in no distress,  normal affect  ENT: No lesions,  mouth clear,  oropharynx clear, no postnasal drip  Neck: No JVD, no TMG, no carotid bruits  Lungs: No use of accessory muscles, no dullness to percussion, clear without rales or rhonchi  Cardiovascular: RRR, heart sounds normal, no murmur or gallops, no peripheral edema  Musculoskeletal: No deformities, no cyanosis or clubbing  Neuro: alert, non focal  Skin: Warm, no lesions or rashes    07/16/15 --  COMPARISON:  Chest radiographs have 04/06/2015. Chest CT 11/16/2014. Chest abdomen and pelvic CTs of 06/21/2014.  FINDINGS: CT CHEST FINDINGS  Mediastinum/Lymph Nodes: low left jugular/ supraclavicular adenopathy, including at 9 mm on image 8/series 2. 7 mm on the prior. Aortic and branch vessel atherosclerosis. Moderate cardiomegaly with multivessel coronary artery atherosclerosis. No pericardial effusion. No central pulmonary embolism, on this non-dedicated study.  Mediastinal adenopathy. Subcarinal node measures 1.8 cm on image 29/series 2. Compare 1.4 cm on the prior. Calcified right hilar node is likely related to old granulomatous disease.  Left infrahilar node measures 10 mm on image 30/series 2 and is unchanged.  Periesophageal adenopathy including at 2.8 cm on image 44/series 2. Compare 2.1 cm on the  prior.  A tiny hiatal hernia. Preaortic nodes are upper normal size, but increased since the prior. Example 7 mm.  Lungs/Pleura: No pleural fluid. Mosaic attenuation is slightly increased since the prior exam, favored to be related to hypoventilation and resultant air trapping. Left upper lobe pulmonary nodule measures 6 mm, including on image 76/series 4.  Musculoskeletal: No acute osseous abnormality. Left mastectomy. Right subpectoral node is mildly enlarged at 8 mm today versus 6 mm on the prior (when remeasured).      Assessment & Plan:  COPD (chronic obstructive pulmonary disease) Please continue your Spiriva daily as you have been taking it. We'll check on a Chiropractor program for this medication. Take albuterol 2 puffs up to every 4 hours if needed for shortness of breath, wheeze.  I agree with increasing your exercise slowly to boost your stamina. This will help with all of your medical issues.  You may want to try using mucinex 600mg  daily to see if this makes it easier for you to clear your mucous in the morning.  Follow with Dr [name] in 6 months or sooner if you have any problems  Lymphadenopathy Etiology unclear. I agree that the risks of biopsy or other intervention may outweigh the benefits at this point. She is asymptomatic. She will follow-up with Dr. [name] in one year  with a CT scan to follow  Baltazar Apo, MD, PhD 12/05/2015, 11:11 AM [facility] [phone] or if no answer [phone]

## 2015-12-08 ENCOUNTER — Other Ambulatory Visit: Payer: Self-pay | Admitting: Emergency Medicine

## 2015-12-13 ENCOUNTER — Telehealth: Payer: Self-pay | Admitting: Emergency Medicine

## 2015-12-13 MED ORDER — TIOTROPIUM BROMIDE MONOHYDRATE 2.5 MCG/ACT IN AERS: INHALATION_SPRAY | RESPIRATORY_TRACT | 3 refills | Status: DC

## 2015-12-13 NOTE — Telephone Encounter (Signed)
Forms have been filled out. Rx for Spiriva Respimat has been printed to be signed. Will await RB's return.

## 2015-12-26 NOTE — Telephone Encounter (Signed)
Forms and prescription have been signed and faxed in. Will wait on patient assistance determination.

## 2015-12-31 ENCOUNTER — Telehealth: Payer: Self-pay | Admitting: Emergency Medicine

## 2015-12-31 MED ORDER — TIOTROPIUM BROMIDE MONOHYDRATE 2.5 MCG/ACT IN AERS: 2.0000 | INHALATION_SPRAY | Freq: Every day | RESPIRATORY_TRACT | 3 refills | Status: DC

## 2015-12-31 NOTE — Telephone Encounter (Signed)
Spoke with Tiffany @ [facility] [employer], they are needing rx for Spiriva Respimat to be changed to INHALE 2 PUFFS DAILY and not "into each lung".   RB is not in office this week.   JN - would you mind signing as RB will not be in office until late next week. Thanks!

## 2015-12-31 NOTE — Telephone Encounter (Signed)
New order needs to be faxed to [phone] Rx given to [facility] for JN to sign.  This needs to be faxed first thing 01/01/16

## 2015-12-31 NOTE — Telephone Encounter (Signed)
That’s fine with me

## 2016-01-01 NOTE — Telephone Encounter (Signed)
Kristin Hood did this get faxed? Thanks.

## 2016-01-01 NOTE — Telephone Encounter (Signed)
Form has been faxed and signed.  Nothing further needed.

## 2016-01-03 DIAGNOSIS — M1811 Unilateral primary osteoarthritis of first carpometacarpal joint, right hand: Secondary | ICD-10-CM | POA: Diagnosis not present

## 2016-01-03 DIAGNOSIS — M19131 Post-traumatic osteoarthritis, right wrist: Secondary | ICD-10-CM | POA: Diagnosis not present

## 2016-01-08 ENCOUNTER — Other Ambulatory Visit: Payer: Self-pay | Admitting: Internal Medicine

## 2016-01-09 ENCOUNTER — Encounter: Payer: Self-pay | Admitting: Emergency Medicine

## 2016-01-09 MED ORDER — ALBUTEROL SULFATE HFA 108 (90 BASE) MCG/ACT IN AERS: 2.0000 | INHALATION_SPRAY | RESPIRATORY_TRACT | 5 refills | Status: DC | PRN

## 2016-01-16 ENCOUNTER — Other Ambulatory Visit: Payer: Self-pay | Admitting: Vascular Surgery

## 2016-01-16 DIAGNOSIS — I83812 Varicose veins of left lower extremities with pain: Secondary | ICD-10-CM

## 2016-02-13 DIAGNOSIS — Z86010 Personal history of colonic polyps: Secondary | ICD-10-CM | POA: Diagnosis not present

## 2016-02-14 DIAGNOSIS — M19131 Post-traumatic osteoarthritis, right wrist: Secondary | ICD-10-CM | POA: Diagnosis not present

## 2016-02-14 DIAGNOSIS — M1811 Unilateral primary osteoarthritis of first carpometacarpal joint, right hand: Secondary | ICD-10-CM | POA: Diagnosis not present

## 2016-02-15 ENCOUNTER — Telehealth: Payer: Self-pay | Admitting: Pharmacist

## 2016-02-15 NOTE — Telephone Encounter (Signed)
Received fax from [place] that pt is having a colonoscopy on 04/07/16 with request to stop Eliquis. Pt takes Eliquis for afib with CHADS2 score of 5 (stroke in 2013, HTN, age, and HF). Pt is at high risk for a stroke when she discontinues anticoagulation. Recommend that she only hold her Eliquis for 24 hours prior to procedure and resume ASAP afterwards. Clearance faxed back to Dr Michail Sermon [phone].

## 2016-02-18 DIAGNOSIS — Z961 Presence of intraocular lens: Secondary | ICD-10-CM | POA: Diagnosis not present

## 2016-02-20 ENCOUNTER — Encounter: Payer: Self-pay | Admitting: Vascular Surgery

## 2016-02-28 ENCOUNTER — Encounter: Payer: Self-pay | Admitting: Vascular Surgery

## 2016-02-28 ENCOUNTER — Ambulatory Visit (INDEPENDENT_AMBULATORY_CARE_PROVIDER_SITE_OTHER): Payer: PPO | Admitting: Vascular Surgery

## 2016-02-28 ENCOUNTER — Ambulatory Visit (HOSPITAL_COMMUNITY)
Admission: RE | Admit: 2016-02-28 | Discharge: 2016-02-28 | Disposition: A | Discharge status: Home / Self Care | Payer: PPO | Source: Ambulatory Visit | Attending: Vascular Surgery | Admitting: Vascular Surgery

## 2016-02-28 VITALS — BP 130/84 | HR 70 | Temp 97.0°F | Resp 28 | Ht 66.0 in | Wt 266.0 lb

## 2016-02-28 DIAGNOSIS — I83812 Varicose veins of left lower extremities with pain: Secondary | ICD-10-CM | POA: Diagnosis not present

## 2016-02-28 DIAGNOSIS — I872 Venous insufficiency (chronic) (peripheral): Secondary | ICD-10-CM | POA: Insufficient documentation

## 2016-02-28 DIAGNOSIS — I83813 Varicose veins of bilateral lower extremities with pain: Secondary | ICD-10-CM | POA: Diagnosis not present

## 2016-02-28 NOTE — Progress Notes (Signed)
Referring Physician: self  Patient name: Kristin Hood MRN: [id number] DOB: 1940-10-07 Sex: female  REASON FOR CONSULT: Symptomatic varicose veins  HPI: Kristin Hood is a 75 y.o. female, with a lengthy history of venous problems. She had sclerotherapy and what sounds like some stab avulsions in the past. She complains primarily of pain in a varicosity across the right anterior thigh. She states she has difficulty wearing compression stockings because of her body habitus. She also complains of the stockings rub across this varicosity in her anterior thigh. Other medical problems include atrial fibrillation requiring Eliquis, COPD, old a cholesterol hypertension. These are currently stable.  Past Medical History:  Diagnosis Date  . Anginal pain ([place])   . Arthritis   . Atrial fibrillation ([place]) 07/2012   Eliquis, rate controlled  . Breast cancer ([place]) 2001   left mastectomy  . Coarse tremors    , Essential  . Complication of anesthesia    SMALLER TUBE FOR INTUBATION AS GAGS ON TUBE  . COPD (chronic obstructive pulmonary disease) ([place])   . CVA (cerebral vascular accident) ([place]) 2013  . Dysrhythmia 08/20/2012   Atrial Fibrillation  . Family history of adverse reaction to anesthesia    mother experienced pain with intubation   . GERD (gastroesophageal reflux disease)    history of   . Hard of hearing   . Heart murmur   . History of chemotherapy 11/13/1998 to 2010   Adriamycin/Docotaxol, Dexorubicin/Taxotere, Femara, Tamoxifen  . History of hiatal hernia    history of   . Hx of adenomatous colonic polyps 2006   , Benign  . Hypercholesteremia   . Hypertension    , With mild concentric left ventricular hypertrophy  . Mixed dyslipidemia   . Shingles   . Shortness of breath dyspnea    on exertion   . Spider veins   . Stroke ([place]) 11/17/2011   left side brain-speech-TPA  . Thyroid nodule   . Tremor, essential   . Valvular sclerosis 09/23/2003   without stenosis  .  Varicose veins    Past Surgical History:  Procedure Laterality Date  . APPENDECTOMY  07/10/1957  . BIOPSY THYROID  12/01/2013   ultrasound  . BREAST SURGERY    . BUNIONECTOMY  11/29/1988   bilateral with hammer toes  . CARDIAC CATHETERIZATION  07/2010   , Without significant CAD  . CARDIAC CATHETERIZATION  07/2011   [name]  . CESAREAN SECTION    . CESAREAN SECTION  05/1968, 07/1965  . EYE SURGERY  01/31/2013   eye lid; cataract surgery bilat   . JOINT REPLACEMENT  05/2001   left knee  . JOINT REPLACEMENT  11/2001   right knee  . JOINT REPLACEMENT  01/2008   redo right knee  . KNEE SURGERY  2009   ,Total knee replacement  . LEFT HEART CATHETERIZATION WITH CORONARY ANGIOGRAM N/A 08/14/2011   Procedure: LEFT HEART CATHETERIZATION WITH CORONARY ANGIOGRAM;  Surgeon: [name], MD;  Location: [facility] CATH LAB;  Service: Cardiovascular;  Laterality: N/A;  . Left Rotator Cuff    . MASTECTOMY  10/19/1998   Radical, left -with lymph nodes (8 total)  . Reverse bunionectomy  08/29/1993   removal of Tibial Sesamoid-left  . TEE WITHOUT CARDIOVERSION  11/19/2011   Procedure: TRANSESOPHAGEAL ECHOCARDIOGRAM (TEE);  Surgeon: [name], MD;  Location: [facility] ENDOSCOPY;  Service: Cardiovascular;  Laterality: N/A;  . THYROIDECTOMY N/A 03/02/2015   Procedure: TOTAL THYROIDECTOMY;  Surgeon: Armandina Gemma, MD;  Location:  [facility] ORS;  Service: General;  Laterality: N/A;  . TONSILLECTOMY    . TOTAL HIP ARTHROPLASTY Left 10/11/2014   Procedure: LEFT TOTAL HIP ARTHROPLASTY ANTERIOR APPROACH;  Surgeon: [name], MD;  Location: [facility] ORS;  Service: Orthopedics;  Laterality: Left;    Family History  Problem Relation Age of Onset  . Heart disease Mother   . Heart disease Father   . Heart failure Father     SOCIAL HISTORY: Social History   Social History  . Marital status: Married    Spouse name: N/A  . Number of children: N/A  . Years of education: N/A   Occupational History  . retired    Social  History Main Topics  . Smoking status: Former Smoker    Packs/day: 2.00    Years: 30.00    Types: Cigarettes    Quit date: 01/30/1992  . Smokeless tobacco: Never Used  . Alcohol use 0.6 oz/week    1 Standard drinks or equivalent per week     Comment: wine occassionally  . Drug use: No  . Sexual activity: Not on file   Other Topics Concern  . Not on file   Social History Narrative  . No narrative on file    Allergies  Allergen Reactions  . Floxin [Ofloxacin] Nausea Only and Other (See Comments)    Dizziness, Vertigo    Current Outpatient Prescriptions  Medication Sig Dispense Refill  . albuterol (PROVENTIL HFA;VENTOLIN HFA) 108 (90 Base) MCG/ACT inhaler Inhale 2 puffs into the lungs every 4 (four) hours as needed for wheezing or shortness of breath. 1 Inhaler 5  . amLODipine (NORVASC) 5 MG tablet Take 5 mg by mouth daily.    Marland Kitchen apixaban (ELIQUIS) 5 MG TABS tablet Take 1 tablet (5 mg total) by mouth 2 (two) times daily. 180 tablet 3  . atorvastatin (LIPITOR) 10 MG tablet Take 10 mg by mouth daily after supper.     . fenofibrate 160 MG tablet Take 160 mg by mouth daily after supper.     . ferrous sulfate 325 (65 FE) MG EC tablet Take 325 mg by mouth daily after supper.     Marland Kitchen ketoconazole (NIZORAL) 2 % cream Reported on 05/02/2015  0  . losartan (COZAAR) 100 MG tablet Take 100 mg by mouth daily.    . Multiple Vitamin (MULTIVITAMIN WITH MINERALS) TABS tablet Take 1 tablet by mouth daily.    . Omega-3 Fatty Acids (FISH OIL PO) Take 1,960 mg by mouth 2 (two) times daily. 980 mg EPA-DHA per capsule    . propranolol (INDERAL) 60 MG tablet Take 2 tablets in the morning, 1 in the afternoon, and 1 in the evening. 360 tablet 3  . ranitidine (ZANTAC) 150 MG tablet Take 150 mg by mouth daily after lunch.    . SYNTHROID 175 MCG tablet TAKE 1 TABLET BY MOUTH DAILY BEFORE BREAKFAST. 30 tablet 2  . Tiotropium Bromide Monohydrate (SPIRIVA RESPIMAT) 2.5 MCG/ACT AERS Inhale 2 puffs into the lungs  daily. 3 Inhaler 3   No current facility-administered medications for this visit.     ROS:   General:  No weight loss, Fever, chills  HEENT: No recent headaches, no nasal bleeding, no visual changes, no sore throat  Neurologic: No dizziness, blackouts, seizures. No recent symptoms of stroke or mini- stroke. No recent episodes of slurred speech, or temporary blindness.  Cardiac: No recent episodes of chest pain/pressure, no shortness of breath at rest.  No shortness of breath with exertion.  Denies history of atrial fibrillation or irregular heartbeat  Vascular: No history of rest pain in feet.  No history of claudication.  No history of non-healing ulcer, No history of DVT   Pulmonary: No home oxygen, no productive cough, no hemoptysis,  No asthma or wheezing  Musculoskeletal:  [ ]  Arthritis, [ ]  Low back pain,  [ ]  Joint pain  Hematologic:No history of hypercoagulable state.  No history of easy bleeding.  No history of anemia  Gastrointestinal: No hematochezia or melena,  No gastroesophageal reflux, no trouble swallowing  Urinary: [ ]  chronic Kidney disease, [ ]  on HD - [ ]  MWF or [ ]  TTHS, [ ]  Burning with urination, [ ]  Frequent urination, [ ]  Difficulty urinating;   Skin: No rashes  Psychological: No history of anxiety,  No history of depression   Physical Examination  Vitals:   02/28/16 1201  BP: 130/84  Pulse: 70  Resp: (!) 28  Temp: 97 F (36.1 C)  TempSrc: Oral  SpO2: 96%  Weight: 266 lb (120.7 kg)  Height: 5\' 6"  (1.676 m)    Body mass index is 42.93 kg/m.  General:  Alert and oriented, no acute distress HEENT: Normal Neck: No bruit or JVD Pulmonary: Clear to auscultation bilaterally Cardiac: Regular Rate and Rhythm without murmur Abdomen: Soft, non-tender, non-distended, no mass, no scars Skin: No rash Extremity Pulses:  2+ radial, brachial, femoral, dorsalis pedis, posterior tibial pulses bilaterally Musculoskeletal: No deformity or  edema  Neurologic: Upper and lower extremity motor 5/5 and symmetric  DATA:  Patient had a venous reflux exam of bilateral lower extremities today. She did have common femoral vein and saphenofemoral junction reflux on the left side. However left greater saphenous vein was intact with essentially normal diameter. She had similar findings on the right leg. She did have a large tributary vein crossing over the anterior right thigh consistent with the area of tenderness.  ASSESSMENT:  Patient with chronic varicosities. She understands that a portion of her problem is secondary to obesity. This is also causing some problems with the fit of her compression stockings. I discussed with the patient today the possibility of some sclerotherapy or stab avulsions to remove some of these veins.  PLAN:  Patient wishes to think whether or not she wishes to have her procedure done or continue with her current therapy. She will call us if she wishes to have intervention on her veins at some point in the future.   Ruta Hinds, MD [facility] Office: [phone] Pager: [phone]

## 2016-03-04 ENCOUNTER — Ambulatory Visit (INDEPENDENT_AMBULATORY_CARE_PROVIDER_SITE_OTHER): Payer: PPO | Admitting: Internal Medicine

## 2016-03-04 ENCOUNTER — Encounter: Payer: Self-pay | Admitting: Internal Medicine

## 2016-03-04 VITALS — BP 125/85 | HR 87 | Wt 269.0 lb

## 2016-03-04 DIAGNOSIS — E890 Postprocedural hypothyroidism: Secondary | ICD-10-CM

## 2016-03-04 LAB — T4, FREE: FREE T4: 1.66 ng/dL — AB (ref 0.60–1.60)

## 2016-03-04 LAB — TSH: TSH: 4.54 u[IU]/mL — ABNORMAL HIGH (ref 0.35–4.50)

## 2016-03-04 MED ORDER — LEVOTHYROXINE SODIUM 175 MCG PO TABS: ORAL_TABLET | ORAL | 2 refills | Status: DC

## 2016-03-04 NOTE — Patient Instructions (Signed)
Please stop at the lab.  Please continue Synthroid 175 mcg daily.  Take the thyroid hormone every day, with water, at least 30 minutes before breakfast, separated by at least 4 hours from: - acid reflux medications - calcium - iron - multivitamins  Please come back for a follow-up appointment in 6 months.

## 2016-03-04 NOTE — Progress Notes (Signed)
Patient ID: Kristin Hood, female   DOB: November 28, 1940, 75 y.o.   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 Last visit 6 months ago.  She fell in 11/2015 >> hurt her hand >> Xray: OA >> had a steroid inj. In wrist  Reviewed hx: She described having L-sided chest pressure (11/11/2013) >> went to the ED: found to be in A fib. Also had a Chest CT: Mediastinal and retroperitoneal lymph nodes, so a PET scan was recommended: a thyroid nodule appeared hypermetabolic.   PET scan (57/04/7791): hypermetabolic R thyroid nodule  Thyroid U/S (11/21/2013): R 2.7 x 1.8 cm nodule, heterogeneous, with coarse calcifications  R nodule Bx (12/01/2013): FLUS  11/30/2013 TSH: 6.84 (0.34-4.5).  R nodule Bx (07/11/2014): FLUS  07/31/2014: Called and d/w pt about the results of the Afirma test, which is "suspicious for neoplasm" (test results scanned under Media tab). This carries a risk of 75% for cancer. In this case, I suggested total thyroidectomy.   03/02/2015: Total thyroidectomy - Dr Harlow Asa >> benign pathology! 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  Reviewed TFTs: Lab Results  Component Value Date   TSH 6.46 (H) 11/19/2015   TSH 17.91 (H) 10/03/2015   TSH 25.94 (H) 08/22/2015   TSH 31.84 (H) 07/20/2015   TSH 36.21 (H) 06/06/2015   TSH 4.02 12/11/2014   FREET4 1.55 11/19/2015   FREET4 1.26 10/03/2015   FREET4 1.00 08/22/2015   FREET4 1.04 07/20/2015   FREET4 1.00 06/06/2015   FREET4 1.13 12/11/2014  04/11/2015: TSH 40.5!  Pt denies feeling nodules in neck, + hoarseness, no  dysphagia/odynophagia, SOB with lying down. Her voice is improved after the surgery.  Pt c/o: - + hoarseness - + tremors (hereditary) - since a teenager - no fatigue - no heat intolerance/cold intolerance - no palpitations - no anxiety/no depression - no hyperdefecation/no constipation - no weight loss/no gain - no dry skin - no hair falling  Pt does have a FH of thyroid ds.: mother (hypothyroidism). No FH of thyroid cancer. No h/o radiation tx to head or neck. She had ChTx for her BrCA - dx 2000.  No seaweed or kelp, no recent contrast studies. No steroid use. No herbal supplements.   ROS: Constitutional: + see HPI Eyes: no blurry vision, no xerophthalmia ENT: no sore throat, see HPI Cardiovascular: no CP/+ exertional SOB/no palpitations/leg swelling Respiratory: no cough/+ exertional SOB/+ wheezing Gastrointestinal: no N/V/D/C Musculoskeletal: no muscle/joint aches Skin: no rashes Neurological: + tremors/no numbness/tingling/dizziness  I reviewed pt's medications, allergies, PMH, social hx, family hx, and changes were documented in the history of present illness. Otherwise, unchanged from my initial visit note.  Past Medical History:  Diagnosis Date  . Anginal pain ([place])   . Arthritis   . Atrial fibrillation ([place]) 07/2012   Eliquis, rate controlled  . Breast cancer ([place]) 2001   left mastectomy  . Coarse tremors    , Essential  . Complication of anesthesia    SMALLER TUBE FOR INTUBATION AS GAGS ON TUBE  . COPD (chronic obstructive pulmonary disease) ([place])   .  CVA (cerebral vascular accident) ([place]) 2013  . Dysrhythmia 08/20/2012   Atrial Fibrillation  . Family history of adverse reaction to anesthesia    mother experienced pain with intubation   . GERD (gastroesophageal reflux disease)    history of   . Hard of hearing   . Heart murmur   . History of chemotherapy 11/13/1998 to 2010   Adriamycin/Docotaxol, Dexorubicin/Taxotere, Femara, Tamoxifen  . History of  hiatal hernia    history of   . Hx of adenomatous colonic polyps 2006   , Benign  . Hypercholesteremia   . Hypertension    , With mild concentric left ventricular hypertrophy  . Mixed dyslipidemia   . Shingles   . Shortness of breath dyspnea    on exertion   . Spider veins   . Stroke ([place]) 11/17/2011   left side brain-speech-TPA  . Thyroid nodule   . Tremor, essential   . Valvular sclerosis 09/23/2003   without stenosis  . Varicose veins    Past Surgical History:  Procedure Laterality Date  . APPENDECTOMY  07/10/1957  . BIOPSY THYROID  12/01/2013   ultrasound  . BREAST SURGERY    . BUNIONECTOMY  11/29/1988   bilateral with hammer toes  . CARDIAC CATHETERIZATION  07/2010   , Without significant CAD  . CARDIAC CATHETERIZATION  07/2011   [name]  . CESAREAN SECTION    . CESAREAN SECTION  05/1968, 07/1965  . EYE SURGERY  01/31/2013   eye lid; cataract surgery bilat   . JOINT REPLACEMENT  05/2001   left knee  . JOINT REPLACEMENT  11/2001   right knee  . JOINT REPLACEMENT  01/2008   redo right knee  . KNEE SURGERY  2009   ,Total knee replacement  . LEFT HEART CATHETERIZATION WITH CORONARY ANGIOGRAM N/A 08/14/2011   Procedure: LEFT HEART CATHETERIZATION WITH CORONARY ANGIOGRAM;  Surgeon: [name], MD;  Location: [facility] CATH LAB;  Service: Cardiovascular;  Laterality: N/A;  . Left Rotator Cuff    . MASTECTOMY  10/19/1998   Radical, left -with lymph nodes (8 total)  . Reverse bunionectomy  08/29/1993   removal of Tibial Sesamoid-left  . TEE WITHOUT CARDIOVERSION  11/19/2011   Procedure: TRANSESOPHAGEAL ECHOCARDIOGRAM (TEE);  Surgeon: [name], MD;  Location: [facility] ENDOSCOPY;  Service: Cardiovascular;  Laterality: N/A;  . THYROIDECTOMY N/A 03/02/2015   Procedure: TOTAL THYROIDECTOMY;  Surgeon: Armandina Gemma, MD;  Location: [facility] ORS;  Service: General;  Laterality: N/A;  . TONSILLECTOMY    . TOTAL HIP ARTHROPLASTY Left 10/11/2014   Procedure: LEFT TOTAL HIP ARTHROPLASTY  ANTERIOR APPROACH;  Surgeon: [name], MD;  Location: [facility] ORS;  Service: Orthopedics;  Laterality: Left;   History   Social History  . Marital Status: Married    Spouse Name: N/A    Number of Children: 2   Occupational History  . homemaker   Social History Main Topics  . Smoking status: Former Smoker -- 2.50 packs/day for 13 years    Types: Cigarettes    Quit date: 01/30/1992  . Smokeless tobacco: Never Used  . Alcohol Use:     3 drink(s) per week  . Drug Use: No   Current Outpatient Prescriptions on File Prior to Visit  Medication Sig Dispense Refill  . albuterol (PROVENTIL HFA;VENTOLIN HFA) 108 (90 Base) MCG/ACT inhaler Inhale 2 puffs into the lungs every 4 (four) hours as needed for wheezing or shortness of breath. 1 Inhaler 5  . amLODipine (NORVASC) 5 MG tablet Take 5  mg by mouth daily.    Marland Kitchen apixaban (ELIQUIS) 5 MG TABS tablet Take 1 tablet (5 mg total) by mouth 2 (two) times daily. 180 tablet 3  . atorvastatin (LIPITOR) 10 MG tablet Take 10 mg by mouth daily after supper.     . fenofibrate 160 MG tablet Take 160 mg by mouth daily after supper.     . ferrous sulfate 325 (65 FE) MG EC tablet Take 325 mg by mouth daily after supper.     Marland Kitchen ketoconazole (NIZORAL) 2 % cream Reported on 05/02/2015  0  . losartan (COZAAR) 100 MG tablet Take 100 mg by mouth daily.    . Multiple Vitamin (MULTIVITAMIN WITH MINERALS) TABS tablet Take 1 tablet by mouth daily.    . Omega-3 Fatty Acids (FISH OIL PO) Take 1,960 mg by mouth 2 (two) times daily. 980 mg EPA-DHA per capsule    . propranolol (INDERAL) 60 MG tablet Take 2 tablets in the morning, 1 in the afternoon, and 1 in the evening. 360 tablet 3  . ranitidine (ZANTAC) 150 MG tablet Take 150 mg by mouth daily after lunch.    . SYNTHROID 175 MCG tablet TAKE 1 TABLET BY MOUTH DAILY BEFORE BREAKFAST. 30 tablet 2  . Tiotropium Bromide Monohydrate (SPIRIVA RESPIMAT) 2.5 MCG/ACT AERS Inhale 2 puffs into the lungs daily. 3 Inhaler 3   No current  facility-administered medications on file prior to visit.    Allergies  Allergen Reactions  . Floxin [Ofloxacin] Nausea Only and Other (See Comments)    Dizziness, Vertigo   Family History  Problem Relation Age of Onset  . Heart disease Mother   . Heart disease Father   . Heart failure Father    PE: BP 125/85   Pulse 87   Wt 269 lb (122 kg)   BMI 43.42 kg/m  Body mass index is 43.42 kg/m. Wt Readings from Last 3 Encounters:  03/04/16 269 lb (122 kg)  02/28/16 266 lb (120.7 kg)  12/05/15 267 lb (121.1 kg)   Constitutional: obese, in NAD Eyes: PERRLA, EOMI, no exophthalmos ENT: moist mucous membranes; thyroidectomy scar healed; no nodule palpated; no cervical lymphadenopathy Cardiovascular: RRR, No MRG Respiratory: CTA B Gastrointestinal: abdomen soft, NT, ND, BS+ Musculoskeletal: no deformities, strength intact in all 4;  Skin: moist, warm, no rashes Neurological: + tremor with outstretched hands, DTR 0/4 in B knees (previous sx)  ASSESSMENT: 1. Postoperative hypothyroidism - h/o R thyroid nodule, hypermetabolic on PET, FLUS x2 on Bx, Afirma + >> benign final pathology  PLAN: 1. 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 With all the above factors pointing towards thyroid cancer,I recommended surgery >> she had this 03/02/2015 and very surprisingly, the final pathology was benign. - she is on LT4 175 mcg (now generic)- we reviewed together her most recent TSH from 3 mo ago >> elevated, but improved, therefore, we did not change the dose then - discussed how to take it correctly: every day, with water, >30 minutes before breakfast, separated by >4 hours from acid reflux medications, calcium, iron, multivitamins. She is taking it correctly.  - will check TFTs today - RTC in 6 mo   Component      Latest Ref Rng & Units 03/04/2016  TSH     0.35 - 4.50 uIU/mL 4.54 (H)  T4,Free(Direct)     0.60 -  1.60 ng/dL 1.66 (H)   Best TSH in a long while! It is now only mildly above the upper limit of normal. Will not change her levothyroxine, both like to repeat the level in 2 months.  Philemon Kingdom, MD PhD [facility]

## 2016-03-05 ENCOUNTER — Other Ambulatory Visit: Payer: Self-pay | Admitting: Internal Medicine

## 2016-03-05 DIAGNOSIS — E890 Postprocedural hypothyroidism: Secondary | ICD-10-CM

## 2016-03-13 DIAGNOSIS — M25511 Pain in right shoulder: Secondary | ICD-10-CM | POA: Diagnosis not present

## 2016-03-13 DIAGNOSIS — M19131 Post-traumatic osteoarthritis, right wrist: Secondary | ICD-10-CM | POA: Diagnosis not present

## 2016-03-13 DIAGNOSIS — M1811 Unilateral primary osteoarthritis of first carpometacarpal joint, right hand: Secondary | ICD-10-CM | POA: Diagnosis not present

## 2016-04-04 ENCOUNTER — Other Ambulatory Visit: Payer: Self-pay | Admitting: Internal Medicine

## 2016-04-07 DIAGNOSIS — K5730 Diverticulosis of large intestine without perforation or abscess without bleeding: Secondary | ICD-10-CM | POA: Diagnosis not present

## 2016-04-07 DIAGNOSIS — K640 First degree hemorrhoids: Secondary | ICD-10-CM | POA: Diagnosis not present

## 2016-04-07 DIAGNOSIS — Z86010 Personal history of colonic polyps: Secondary | ICD-10-CM | POA: Diagnosis not present

## 2016-04-29 DIAGNOSIS — M19131 Post-traumatic osteoarthritis, right wrist: Secondary | ICD-10-CM | POA: Diagnosis not present

## 2016-04-29 DIAGNOSIS — M1811 Unilateral primary osteoarthritis of first carpometacarpal joint, right hand: Secondary | ICD-10-CM | POA: Diagnosis not present

## 2016-05-02 DIAGNOSIS — E782 Mixed hyperlipidemia: Secondary | ICD-10-CM | POA: Diagnosis not present

## 2016-05-02 DIAGNOSIS — E890 Postprocedural hypothyroidism: Secondary | ICD-10-CM | POA: Diagnosis not present

## 2016-05-07 ENCOUNTER — Encounter: Payer: Self-pay | Admitting: Internal Medicine

## 2016-05-14 ENCOUNTER — Telehealth: Payer: Self-pay

## 2016-05-14 NOTE — Telephone Encounter (Signed)
Called and notified patient of lab results, from PCP; no change in medications, and sent paperwork to scan.

## 2016-06-05 ENCOUNTER — Encounter: Payer: Self-pay | Admitting: Emergency Medicine

## 2016-06-05 ENCOUNTER — Ambulatory Visit (INDEPENDENT_AMBULATORY_CARE_PROVIDER_SITE_OTHER): Payer: PPO | Admitting: Emergency Medicine

## 2016-06-05 DIAGNOSIS — R490 Dysphonia: Secondary | ICD-10-CM

## 2016-06-05 DIAGNOSIS — J449 Chronic obstructive pulmonary disease, unspecified: Secondary | ICD-10-CM | POA: Diagnosis not present

## 2016-06-05 DIAGNOSIS — R591 Generalized enlarged lymph nodes: Secondary | ICD-10-CM

## 2016-06-05 MED ORDER — FLUTICASONE PROPIONATE 50 MCG/ACT NA SUSP: 2.0000 | Freq: Every day | NASAL | 2 refills | Status: DC

## 2016-06-05 MED ORDER — OMEPRAZOLE 20 MG PO CPDR: 20.0000 mg | DELAYED_RELEASE_CAPSULE | Freq: Two times a day (BID) | ORAL | 6 refills | Status: DC

## 2016-06-05 MED ORDER — TIOTROPIUM BROMIDE-OLODATEROL 2.5-2.5 MCG/ACT IN AERS: 2.0000 | INHALATION_SPRAY | Freq: Every day | RESPIRATORY_TRACT | 0 refills | Status: AC

## 2016-06-05 NOTE — Addendum Note (Signed)
Addended by: Maryanna Shape A on: 06/05/2016 11:54 AM   Modules accepted: Orders

## 2016-06-05 NOTE — Progress Notes (Signed)
Subjective:    Patient ID: Kristin Hood, female    DOB: [birth year], 76 y.o.   MRN: [id number]  Shortness of Breath  Pertinent negatives include no ear pain, fever, headaches, leg swelling, rash, rhinorrhea, sore throat, vomiting or wheezing.   76 year old woman, former smoker (60 pack years), with history of hypertension, atrial fibrillation, cerebrovascular disease, and breast cancer treated in 2001 w mastectomy, no XRT. She has history of some mediastinal lymphadenopathy and splenomegaly that has been followed by oncology in the aftermath of her breast cancer. She was just admitted to the hospital 10/2014 for . A CT scan of the chest was performed on 11/16/14 personally reviewed today. This showed no evidence of PE but some evidence for air trapping, tracheobronchomalacia, bronchitis. An echocardiogram was performed 8/19 that showed concentric left ventricular hypertrophy with an EF of 60-65%, moderately dilated left atrium, moderately dilated right atrium, mildly dilated right ventricle and an estimated PASP of 46 mmHg. She was treated with albuterol, was sent home w a script - seems to help her.  She sleeps on her L side with head on 2 pillows or in a recliner, does not really snore, feels well rested in the am. No witness apneas.   ROV 12/28/14 -- follow-up visit for dyspnea with a history of tobacco abuse, treated breast cancer with serial CT scans show mediastinal lymphadenopathy and splenomegaly that has been stable by serial CT scans. She also has some subtle evidence of groundglass versus air trapping on her most recent CT. She underwent full pulmonary function testing today that I have personally reviewed. This showed mixed obstruction and restriction with a normal TLC and normal diffusion capacity.  She has been using albuterol nebulizer prn, feels that it does help her when she exerts.   ROV 01/30/15 -- follow-up visit for COPD and some evidence for air trapping on an abnormal CT scan of  the chest that was performed 10/2014. At her last visit we started Spiriva - she could tell an improvement in her breathing. Unfortunately the cost was very high, > $250 for her part.   ROV 06/04/15 -- follow-up visit for history of tobacco use and COPD, history of mediastinal lymphadenopathy and splenomegaly on serial CT scans. She also has some subtle groundglass versus air trapping that we have followed by CT chest last in August 2016. At our last visit we stopped Spiriva and started her on Incruse.  Her insurance changed January and she is now back on Spiriva Respimat. Since last visit she had a thyroidectomy, turned out to be a benign lesion. She is still having some voice changes and hoarseness. She is having some allergy sx - usually in her throat, not so much from her nose. She has GERD sx even on her zantac. She has exertional SOB.    ROV 12/05/15 -- This follow-up visit for patient with history of COPD and mediastinal And abdominal lymphadenopathy noted on serial CT scans of the chest, Last 06/2015. This is been followed in oncology by Dr. [name]. The slow progression on her scans has been suggestive of possible low-grade lymphoma but she has remained asymptomatic. She is also followed for atrial fibrillation, diastolic CHF, hypertension.  She reports today that she is doing fairly well. She is working on increasing her exercise. Also notes that she can have exertional SOB that requires using albuterol - usually with stairs, heavier exertion. She is reliable with spiriva. She had the flu shot 11/16/15.   ROV 06/05/16 -- patient has  a history of COPD, mediastinal and abd LAD that has been followed on serial imaging. Last CT's done 4/'17 w Dr [name].  Has been managed on Spiriva. She notes that she has had more mucous last few days, more dyspnea, bothering her and waking her at night, getting her choked up. Uses albuterol infrequently. Uncertain if she snores.         Review of Systems  Constitutional:  Negative for fever and unexpected weight change.  HENT: Positive for congestion and postnasal drip. Negative for dental problem, ear pain, nosebleeds, rhinorrhea, sinus pressure, sneezing, sore throat and trouble swallowing.   Eyes: Negative for redness and itching.  Respiratory: Positive for shortness of breath. Negative for cough, chest tightness and wheezing.   Cardiovascular: Negative for palpitations and leg swelling.  Gastrointestinal: Negative for nausea and vomiting.  Genitourinary: Negative for dysuria.  Musculoskeletal: Negative for joint swelling.  Skin: Negative for rash.  Neurological: Negative for headaches.  Hematological: Does not bruise/bleed easily.  Psychiatric/Behavioral: Negative for dysphoric mood. The patient is not nervous/anxious.        Objective:   Physical Exam Vitals:   06/05/16 1115  BP: 122/74  Pulse: 77  SpO2: 93%  Weight: 264 lb 3.2 oz (119.8 kg)  Height: 5\' 6"  (1.676 m)   Gen: Pleasant, well-nourished, in no distress,  normal affect  ENT: No lesions,  mouth clear,  oropharynx clear, no postnasal drip  Neck: No JVD, no TMG, no carotid bruits  Lungs: No use of accessory muscles, no dullness to percussion, clear without rales or rhonchi  Cardiovascular: RRR, heart sounds normal, no murmur or gallops, no peripheral edema  Musculoskeletal: No deformities, no cyanosis or clubbing  Neuro: alert, non focal  Skin: Warm, no lesions or rashes    07/16/15 --  COMPARISON:  Chest radiographs have 04/06/2015. Chest CT 11/16/2014. Chest abdomen and pelvic CTs of 06/21/2014.  FINDINGS: CT CHEST FINDINGS  Mediastinum/Lymph Nodes: low left jugular/ supraclavicular adenopathy, including at 9 mm on image 8/series 2. 7 mm on the prior. Aortic and branch vessel atherosclerosis. Moderate cardiomegaly with multivessel coronary artery atherosclerosis. No pericardial effusion. No central pulmonary embolism, on this non-dedicated study.  Mediastinal  adenopathy. Subcarinal node measures 1.8 cm on image 29/series 2. Compare 1.4 cm on the prior. Calcified right hilar node is likely related to old granulomatous disease.  Left infrahilar node measures 10 mm on image 30/series 2 and is unchanged.  Periesophageal adenopathy including at 2.8 cm on image 44/series 2. Compare 2.1 cm on the prior.  A tiny hiatal hernia. Preaortic nodes are upper normal size, but increased since the prior. Example 7 mm.  Lungs/Pleura: No pleural fluid. Mosaic attenuation is slightly increased since the prior exam, favored to be related to hypoventilation and resultant air trapping. Left upper lobe pulmonary nodule measures 6 mm, including on image 76/series 4.  Musculoskeletal: No acute osseous abnormality. Left mastectomy. Right subpectoral node is mildly enlarged at 8 mm today versus 6 mm on the prior (when remeasured).      Assessment & Plan:  Lymphadenopathy Continue to follow with serial Ct's per dr Worthy Flank plans. No plan for intervention at this time.   COPD (chronic obstructive pulmonary disease) Continue your mucinex as you are taking it.  Continue your Spiriva respimat every day.  Keep albuterol available to use as needed for shortness of breath. .  Follow with Dr [name] in 6 months or sooner if you have any problems  Hoarseness With night awakenings and mucous.  We will try starting fluticasone nasal spray, 2 sprays each nostril daily.  Continue your mucinex as you are taking it.  Restart omeprazole 20mg  twice a day  Baltazar Apo, MD, PhD 06/05/2016, 11:43 AM [facility] [phone] or if no answer [phone]

## 2016-06-05 NOTE — Addendum Note (Signed)
Addended by: Maryanna Shape A on: 06/05/2016 01:37 PM   Modules accepted: Orders

## 2016-06-05 NOTE — Patient Instructions (Addendum)
We will try starting fluticasone nasal spray, 2 sprays each nostril daily.  Continue your mucinex as you are taking it.  Restart omeprazole 20mg  twice a day Continue your Spiriva respimat every day.  Keep albuterol available to use as needed for shortness of breath.  Get your CT scans with Dr [name] as planned.  Follow with Dr [name] in 6 months or sooner if you have any problems

## 2016-06-05 NOTE — Assessment & Plan Note (Signed)
Continue to follow with serial Ct's per dr Worthy Flank plans. No plan for intervention at this time.

## 2016-06-05 NOTE — Assessment & Plan Note (Signed)
Continue your mucinex as you are taking it.  Continue your Spiriva respimat every day.  Keep albuterol available to use as needed for shortness of breath. .  Follow with Dr [name] in 6 months or sooner if you have any problems

## 2016-06-05 NOTE — Assessment & Plan Note (Signed)
With night awakenings and mucous.   We will try starting fluticasone nasal spray, 2 sprays each nostril daily.  Continue your mucinex as you are taking it.  Restart omeprazole 20mg  twice a day

## 2016-06-06 ENCOUNTER — Encounter: Payer: Self-pay | Admitting: Internal Medicine

## 2016-06-09 ENCOUNTER — Observation Stay (HOSPITAL_COMMUNITY): Payer: PPO

## 2016-06-09 ENCOUNTER — Emergency Department (HOSPITAL_COMMUNITY): Payer: PPO

## 2016-06-09 ENCOUNTER — Encounter (HOSPITAL_COMMUNITY): Payer: Self-pay | Admitting: Emergency Medicine

## 2016-06-09 ENCOUNTER — Inpatient Hospital Stay (HOSPITAL_COMMUNITY)
Admission: EM | Admit: 2016-06-09 | Discharge: 2016-06-12 | DRG: 291 | Disposition: A | Discharge status: Home / Self Care | Payer: PPO | Attending: Internal Medicine | Admitting: Internal Medicine

## 2016-06-09 DIAGNOSIS — I5032 Chronic diastolic (congestive) heart failure: Secondary | ICD-10-CM

## 2016-06-09 DIAGNOSIS — J449 Chronic obstructive pulmonary disease, unspecified: Secondary | ICD-10-CM | POA: Diagnosis not present

## 2016-06-09 DIAGNOSIS — R599 Enlarged lymph nodes, unspecified: Secondary | ICD-10-CM | POA: Diagnosis not present

## 2016-06-09 DIAGNOSIS — Z87891 Personal history of nicotine dependence: Secondary | ICD-10-CM | POA: Diagnosis not present

## 2016-06-09 DIAGNOSIS — Z6841 Body Mass Index (BMI) 40.0 and over, adult: Secondary | ICD-10-CM | POA: Diagnosis not present

## 2016-06-09 DIAGNOSIS — Z8673 Personal history of transient ischemic attack (TIA), and cerebral infarction without residual deficits: Secondary | ICD-10-CM

## 2016-06-09 DIAGNOSIS — R0602 Shortness of breath: Secondary | ICD-10-CM | POA: Diagnosis not present

## 2016-06-09 DIAGNOSIS — C8590 Non-Hodgkin lymphoma, unspecified, unspecified site: Secondary | ICD-10-CM | POA: Diagnosis present

## 2016-06-09 DIAGNOSIS — E785 Hyperlipidemia, unspecified: Secondary | ICD-10-CM | POA: Diagnosis present

## 2016-06-09 DIAGNOSIS — Z853 Personal history of malignant neoplasm of breast: Secondary | ICD-10-CM | POA: Diagnosis not present

## 2016-06-09 DIAGNOSIS — E784 Other hyperlipidemia: Secondary | ICD-10-CM | POA: Diagnosis not present

## 2016-06-09 DIAGNOSIS — R918 Other nonspecific abnormal finding of lung field: Secondary | ICD-10-CM | POA: Diagnosis not present

## 2016-06-09 DIAGNOSIS — Z7901 Long term (current) use of anticoagulants: Secondary | ICD-10-CM

## 2016-06-09 DIAGNOSIS — I110 Hypertensive heart disease with heart failure: Principal | ICD-10-CM | POA: Diagnosis present

## 2016-06-09 DIAGNOSIS — Z9012 Acquired absence of left breast and nipple: Secondary | ICD-10-CM | POA: Diagnosis not present

## 2016-06-09 DIAGNOSIS — J9601 Acute respiratory failure with hypoxia: Secondary | ICD-10-CM | POA: Diagnosis present

## 2016-06-09 DIAGNOSIS — H9190 Unspecified hearing loss, unspecified ear: Secondary | ICD-10-CM | POA: Diagnosis present

## 2016-06-09 DIAGNOSIS — Z9221 Personal history of antineoplastic chemotherapy: Secondary | ICD-10-CM | POA: Diagnosis not present

## 2016-06-09 DIAGNOSIS — J9600 Acute respiratory failure, unspecified whether with hypoxia or hypercapnia: Secondary | ICD-10-CM | POA: Diagnosis not present

## 2016-06-09 DIAGNOSIS — I2510 Atherosclerotic heart disease of native coronary artery without angina pectoris: Secondary | ICD-10-CM | POA: Diagnosis present

## 2016-06-09 DIAGNOSIS — Y92230 Patient room in hospital as the place of occurrence of the external cause: Secondary | ICD-10-CM | POA: Diagnosis not present

## 2016-06-09 DIAGNOSIS — E039 Hypothyroidism, unspecified: Secondary | ICD-10-CM | POA: Diagnosis present

## 2016-06-09 DIAGNOSIS — I10 Essential (primary) hypertension: Secondary | ICD-10-CM | POA: Diagnosis present

## 2016-06-09 DIAGNOSIS — K219 Gastro-esophageal reflux disease without esophagitis: Secondary | ICD-10-CM | POA: Diagnosis not present

## 2016-06-09 DIAGNOSIS — Z79899 Other long term (current) drug therapy: Secondary | ICD-10-CM

## 2016-06-09 DIAGNOSIS — Z96653 Presence of artificial knee joint, bilateral: Secondary | ICD-10-CM | POA: Diagnosis present

## 2016-06-09 DIAGNOSIS — J810 Acute pulmonary edema: Secondary | ICD-10-CM | POA: Diagnosis not present

## 2016-06-09 DIAGNOSIS — T501X5A Adverse effect of loop [high-ceiling] diuretics, initial encounter: Secondary | ICD-10-CM | POA: Diagnosis not present

## 2016-06-09 DIAGNOSIS — I509 Heart failure, unspecified: Secondary | ICD-10-CM | POA: Diagnosis not present

## 2016-06-09 DIAGNOSIS — R079 Chest pain, unspecified: Secondary | ICD-10-CM | POA: Diagnosis not present

## 2016-06-09 DIAGNOSIS — I5030 Unspecified diastolic (congestive) heart failure: Secondary | ICD-10-CM | POA: Diagnosis present

## 2016-06-09 DIAGNOSIS — I482 Chronic atrial fibrillation, unspecified: Secondary | ICD-10-CM | POA: Diagnosis present

## 2016-06-09 DIAGNOSIS — E782 Mixed hyperlipidemia: Secondary | ICD-10-CM | POA: Diagnosis present

## 2016-06-09 DIAGNOSIS — E860 Dehydration: Secondary | ICD-10-CM | POA: Diagnosis present

## 2016-06-09 DIAGNOSIS — Z881 Allergy status to other antibiotic agents status: Secondary | ICD-10-CM

## 2016-06-09 DIAGNOSIS — N179 Acute kidney failure, unspecified: Secondary | ICD-10-CM | POA: Diagnosis not present

## 2016-06-09 DIAGNOSIS — Z96642 Presence of left artificial hip joint: Secondary | ICD-10-CM | POA: Diagnosis present

## 2016-06-09 DIAGNOSIS — Z86010 Personal history of colonic polyps: Secondary | ICD-10-CM

## 2016-06-09 DIAGNOSIS — Z8249 Family history of ischemic heart disease and other diseases of the circulatory system: Secondary | ICD-10-CM

## 2016-06-09 HISTORY — DX: Acute respiratory failure with hypoxia: J96.01

## 2016-06-09 LAB — TROPONIN I: Troponin I: <0.03 ng/mL (ref <0.03)

## 2016-06-09 LAB — I-STAT TROPONIN, ED: Troponin i, poc: 0 ng/mL (ref 0.00–0.08)

## 2016-06-09 LAB — BRAIN NATRIURETIC PEPTIDE: B NATRIURETIC PEPTIDE 5: 362.8 pg/mL — AB (ref 0.0–100.0)

## 2016-06-09 LAB — BASIC METABOLIC PANEL
Anion gap: 8 (ref 5–15)
BUN: 16 mg/dL (ref 6–20)
CHLORIDE: 100 mmol/L — AB (ref 101–111)
CO2: 27 mmol/L (ref 22–32)
CREATININE: 0.78 mg/dL (ref 0.44–1.00)
Calcium: 9.7 mg/dL (ref 8.9–10.3)
GFR calc Af Amer: >60 mL/min (ref >60)
GFR calc non Af Amer: >60 mL/min (ref >60)
GLUCOSE: 121 mg/dL — AB (ref 65–99)
POTASSIUM: 4.6 mmol/L (ref 3.5–5.1)
Sodium: 135 mmol/L (ref 135–145)

## 2016-06-09 LAB — CBC
HEMATOCRIT: 36.9 % (ref 36.0–46.0)
Hemoglobin: 11.7 g/dL — ABNORMAL LOW (ref 12.0–15.0)
MCH: 28.2 pg (ref 26.0–34.0)
MCHC: 31.7 g/dL (ref 30.0–36.0)
MCV: 88.9 fL (ref 78.0–100.0)
PLATELETS: 276 10*3/uL (ref 150–400)
RBC: 4.15 MIL/uL (ref 3.87–5.11)
RDW: 15.8 % — AB (ref 11.5–15.5)
WBC: 5.3 10*3/uL (ref 4.0–10.5)

## 2016-06-09 LAB — PROCALCITONIN: Procalcitonin: <0.1 ng/mL

## 2016-06-09 MED ORDER — GI COCKTAIL ~~LOC~~
30.0000 mL | Freq: Four times a day (QID) | ORAL | Status: DC | PRN
  Administered 2016-06-10: 30 mL via ORAL
  Filled 2016-06-09: qty 30

## 2016-06-09 MED ORDER — ACETAMINOPHEN 325 MG PO TABS: 650.0000 mg | ORAL_TABLET | Freq: Four times a day (QID) | ORAL | Status: DC | PRN

## 2016-06-09 MED ORDER — PROPRANOLOL HCL 60 MG PO TABS
60.0000 mg | ORAL_TABLET | Freq: Two times a day (BID) | ORAL | Status: DC
  Administered 2016-06-09 – 2016-06-11 (×5): 60 mg via ORAL
  Filled 2016-06-09 (×6): qty 1

## 2016-06-09 MED ORDER — ACETAMINOPHEN 650 MG RE SUPP: 650.0000 mg | Freq: Four times a day (QID) | RECTAL | Status: DC | PRN

## 2016-06-09 MED ORDER — TIOTROPIUM BROMIDE MONOHYDRATE 18 MCG IN CAPS
18.0000 ug | ORAL_CAPSULE | Freq: Every day | RESPIRATORY_TRACT | Status: DC
  Administered 2016-06-10 – 2016-06-12 (×3): 18 ug via RESPIRATORY_TRACT
  Filled 2016-06-09: qty 5

## 2016-06-09 MED ORDER — MORPHINE SULFATE (PF) 2 MG/ML IV SOLN
2.0000 mg | INTRAVENOUS | Status: DC | PRN
  Administered 2016-06-10 – 2016-06-11 (×2): 2 mg via INTRAVENOUS
  Filled 2016-06-09 (×2): qty 1

## 2016-06-09 MED ORDER — SODIUM CHLORIDE 0.9 % IV SOLN: 250.0000 mL | INTRAVENOUS | Status: DC | PRN

## 2016-06-09 MED ORDER — LEVOTHYROXINE SODIUM 75 MCG PO TABS
175.0000 ug | ORAL_TABLET | Freq: Every day | ORAL | Status: DC
  Administered 2016-06-10 – 2016-06-12 (×3): 175 ug via ORAL
  Filled 2016-06-09 (×3): qty 1

## 2016-06-09 MED ORDER — TIOTROPIUM BROMIDE MONOHYDRATE 2.5 MCG/ACT IN AERS: 2.0000 | INHALATION_SPRAY | Freq: Every day | RESPIRATORY_TRACT | Status: DC

## 2016-06-09 MED ORDER — FENOFIBRATE 160 MG PO TABS
160.0000 mg | ORAL_TABLET | Freq: Every day | ORAL | Status: DC
  Administered 2016-06-09 – 2016-06-11 (×3): 160 mg via ORAL
  Filled 2016-06-09 (×3): qty 1

## 2016-06-09 MED ORDER — FAMOTIDINE 20 MG PO TABS
20.0000 mg | ORAL_TABLET | Freq: Every day | ORAL | Status: DC
  Administered 2016-06-10 – 2016-06-12 (×3): 20 mg via ORAL
  Filled 2016-06-09 (×4): qty 1

## 2016-06-09 MED ORDER — SODIUM CHLORIDE 0.9% FLUSH: 3.0000 mL | INTRAVENOUS | Status: DC | PRN

## 2016-06-09 MED ORDER — IOPAMIDOL (ISOVUE-300) INJECTION 61%
INTRAVENOUS | Status: AC
  Administered 2016-06-09: 75 mL
  Filled 2016-06-09: qty 75

## 2016-06-09 MED ORDER — FUROSEMIDE 10 MG/ML IJ SOLN
40.0000 mg | Freq: Once | INTRAMUSCULAR | Status: AC
  Administered 2016-06-09: 40 mg via INTRAVENOUS
  Filled 2016-06-09: qty 4

## 2016-06-09 MED ORDER — TIOTROPIUM BROMIDE MONOHYDRATE 18 MCG IN CAPS
18.0000 ug | ORAL_CAPSULE | Freq: Every day | RESPIRATORY_TRACT | Status: DC
  Filled 2016-06-09: qty 5

## 2016-06-09 MED ORDER — SODIUM CHLORIDE 0.9% FLUSH
3.0000 mL | Freq: Two times a day (BID) | INTRAVENOUS | Status: DC
  Administered 2016-06-09 – 2016-06-12 (×6): 3 mL via INTRAVENOUS

## 2016-06-09 MED ORDER — MORPHINE SULFATE (PF) 4 MG/ML IV SOLN
4.0000 mg | Freq: Once | INTRAVENOUS | Status: AC
  Administered 2016-06-09: 4 mg via INTRAVENOUS
  Filled 2016-06-09: qty 1

## 2016-06-09 MED ORDER — PANTOPRAZOLE SODIUM 40 MG PO TBEC
40.0000 mg | DELAYED_RELEASE_TABLET | Freq: Every day | ORAL | Status: DC
  Administered 2016-06-10 – 2016-06-12 (×3): 40 mg via ORAL
  Filled 2016-06-09 (×4): qty 1

## 2016-06-09 MED ORDER — APIXABAN 5 MG PO TABS
5.0000 mg | ORAL_TABLET | Freq: Two times a day (BID) | ORAL | Status: DC
  Administered 2016-06-09 – 2016-06-12 (×6): 5 mg via ORAL
  Filled 2016-06-09 (×6): qty 1

## 2016-06-09 MED ORDER — FUROSEMIDE 10 MG/ML IJ SOLN
40.0000 mg | Freq: Two times a day (BID) | INTRAMUSCULAR | Status: DC
  Administered 2016-06-09 – 2016-06-10 (×3): 40 mg via INTRAVENOUS
  Filled 2016-06-09 (×3): qty 4

## 2016-06-09 MED ORDER — AMLODIPINE BESYLATE 5 MG PO TABS
5.0000 mg | ORAL_TABLET | Freq: Every day | ORAL | Status: DC
  Administered 2016-06-10 – 2016-06-12 (×3): 5 mg via ORAL
  Filled 2016-06-09 (×4): qty 1

## 2016-06-09 MED ORDER — ONDANSETRON HCL 4 MG PO TABS: 4.0000 mg | ORAL_TABLET | Freq: Four times a day (QID) | ORAL | Status: DC | PRN

## 2016-06-09 MED ORDER — LOSARTAN POTASSIUM 50 MG PO TABS
100.0000 mg | ORAL_TABLET | Freq: Every day | ORAL | Status: DC
  Administered 2016-06-10: 100 mg via ORAL
  Filled 2016-06-09: qty 2

## 2016-06-09 MED ORDER — ORAL CARE MOUTH RINSE
15.0000 mL | Freq: Two times a day (BID) | OROMUCOSAL | Status: DC
  Administered 2016-06-09 – 2016-06-11 (×5): 15 mL via OROMUCOSAL

## 2016-06-09 MED ORDER — ATORVASTATIN CALCIUM 10 MG PO TABS
10.0000 mg | ORAL_TABLET | Freq: Every day | ORAL | Status: DC
  Administered 2016-06-09 – 2016-06-11 (×3): 10 mg via ORAL
  Filled 2016-06-09 (×3): qty 1

## 2016-06-09 MED ORDER — ONDANSETRON HCL 4 MG/2ML IJ SOLN: 4.0000 mg | Freq: Four times a day (QID) | INTRAMUSCULAR | Status: DC | PRN

## 2016-06-09 NOTE — ED Provider Notes (Signed)
[facility] DEPT Provider Note   CSN: [id number] Arrival date & time: 06/09/16  6301     History   Chief Complaint Chief Complaint  Patient presents with  . Chest Pain    HPI Kristin Hood is a 76 y.o. female.  HPI 76 year old Caucasian female with a past medical history significant for COPD, hypertension, atrial fibrillation currently owns a relative, CVA, breast cancer treated in 2001 with mastectomy, former smoker that presents to the ED today with complaints of left-sided chest pain that is sharp in nature and radiates to her back. Patient states this chest pain has been ongoing for the past month but gradually worsened today. States the pain is more in the middle of her shoulder blades. She also endorses increased dyspnea. She relates associated dyspnea on exertion. She also endorses nocturnal dyspnea and orthopnea that is new for her. States that her lower extremities have been increased swelling bilaterally over the past 2 days. Patient saw her pulmonologist on 3/8 for same complaint. At that time they continued her on her Spiriva and albuterol and had her follow-up with her oncologist at her scheduled appointment. She does have lung nodules and there doing yearly CT scans to evaluate. Patient is not on oxygen at home. She has tried nothing for her pain prior to arrival. She endorses mild rhinorrhea but denies any cough, fevers, sick contacts. Patient is currently owns her also for atrial fibrillation and has been on for 3 years. She denies any fever, lightheadedness, dizziness, abdominal pain, nausea, emesis, urinary symptoms, change in bowel habits, paresthesias. Past Medical History:  Diagnosis Date  . Anginal pain ([place])   . Arthritis   . Atrial fibrillation ([place]) 07/2012   Eliquis, rate controlled  . Breast cancer ([place]) 2001   left mastectomy  . Coarse tremors    , Essential  . Complication of anesthesia    SMALLER TUBE FOR INTUBATION AS GAGS ON TUBE  . COPD (chronic  obstructive pulmonary disease) ([place])   . CVA (cerebral vascular accident) ([place]) 2013  . Dysrhythmia 08/20/2012   Atrial Fibrillation  . Family history of adverse reaction to anesthesia    mother experienced pain with intubation   . GERD (gastroesophageal reflux disease)    history of   . Hard of hearing   . Heart murmur   . History of chemotherapy 11/13/1998 to 2010   Adriamycin/Docotaxol, Dexorubicin/Taxotere, Femara, Tamoxifen  . History of hiatal hernia    history of   . Hx of adenomatous colonic polyps 2006   , Benign  . Hypercholesteremia   . Hypertension    , With mild concentric left ventricular hypertrophy  . Mixed dyslipidemia   . Shingles   . Shortness of breath dyspnea    on exertion   . Spider veins   . Stroke ([place]) 11/17/2011   left side brain-speech-TPA  . Thyroid nodule   . Tremor, essential   . Valvular sclerosis 09/23/2003   without stenosis  . Varicose veins     Patient Active Problem List   Diagnosis Date Noted  . Hoarseness 06/04/2015  . 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 COPD (chronic obstructive pulmonary disease) ([place]) 12/28/2014  . Abnormal chest CT 12/07/2014  . Chronic atrial fibrillation ([place]) 11/16/2014  . SOB (shortness of breath) 11/16/2014  . Hypertension 11/16/2014  . OA (osteoarthritis) of hip 10/11/2014  . Lymphadenopathy 07/17/2014  . Hyperlipidemia 02/13/2014  . Chronic diastolic heart failure ([place]) 02/13/2014  .  Impaired fasting glucose 12/25/2012  . Mixed hyperlipidemia 12/25/2012  . Pure hypercholesterolemia 12/25/2012  . Benign hypertensive heart disease without heart failure 12/25/2012  . Phlebitis and thrombophlebitis of superficial vessels of lower extremities 12/25/2012  . Abnormal involuntary movements(781.0) 12/25/2012  . Long term current use of anticoagulant therapy 12/25/2012  . Chest pain 12/11/2012  . Atrial fibrillation ([place]) 12/11/2012  .  History of CVA (cerebrovascular accident) 12/11/2012  . Morbid obesity ([place]) 06/18/2012  . Other and unspecified hyperlipidemia 06/18/2012  . Unspecified essential hypertension 06/18/2012  . Essential and other specified forms of tremor 06/18/2012  . Stroke ([place]) 11/19/2011  . Unspecified cerebral artery occlusion with cerebral infarction 11/18/2011  . Morbid obesity with BMI of 45.0-49.9, adult ([place]) 11/18/2011    Past Surgical History:  Procedure Laterality Date  . APPENDECTOMY  07/10/1957  . BIOPSY THYROID  12/01/2013   ultrasound  . BREAST SURGERY    . BUNIONECTOMY  11/29/1988   bilateral with hammer toes  . CARDIAC CATHETERIZATION  07/2010   , Without significant CAD  . CARDIAC CATHETERIZATION  07/2011   [name]  . CESAREAN SECTION    . CESAREAN SECTION  05/1968, 07/1965  . EYE SURGERY  01/31/2013   eye lid; cataract surgery bilat   . JOINT REPLACEMENT  05/2001   left knee  . JOINT REPLACEMENT  11/2001   right knee  . JOINT REPLACEMENT  01/2008   redo right knee  . KNEE SURGERY  2009   ,Total knee replacement  . LEFT HEART CATHETERIZATION WITH CORONARY ANGIOGRAM N/A 08/14/2011   Procedure: LEFT HEART CATHETERIZATION WITH CORONARY ANGIOGRAM;  Surgeon: [name], MD;  Location: [facility] CATH LAB;  Service: Cardiovascular;  Laterality: N/A;  . Left Rotator Cuff    . MASTECTOMY  10/19/1998   Radical, left -with lymph nodes (8 total)  . Reverse bunionectomy  08/29/1993   removal of Tibial Sesamoid-left  . TEE WITHOUT CARDIOVERSION  11/19/2011   Procedure: TRANSESOPHAGEAL ECHOCARDIOGRAM (TEE);  Surgeon: [name], MD;  Location: [facility] ENDOSCOPY;  Service: Cardiovascular;  Laterality: N/A;  . THYROIDECTOMY N/A 03/02/2015   Procedure: TOTAL THYROIDECTOMY;  Surgeon: Armandina Gemma, MD;  Location: [facility] ORS;  Service: General;  Laterality: N/A;  . TONSILLECTOMY    . TOTAL HIP ARTHROPLASTY Left 10/11/2014   Procedure: LEFT TOTAL HIP ARTHROPLASTY ANTERIOR APPROACH;  Surgeon: [name], MD;  Location: [facility] ORS;  Service: Orthopedics;  Laterality: Left;    OB History    No data available       Home Medications    Prior to Admission medications   Medication Sig Start Date End Date Taking? Authorizing Provider  albuterol (PROVENTIL HFA;VENTOLIN HFA) 108 (90 Base) MCG/ACT inhaler Inhale 2 puffs into the lungs every 4 (four) hours as needed for wheezing or shortness of breath. 01/09/16   Collene Gobble, MD  amLODipine (NORVASC) 5 MG tablet Take 5 mg by mouth daily.    Historical Provider, MD  apixaban (ELIQUIS) 5 MG TABS tablet Take 1 tablet (5 mg total) by mouth 2 (two) times daily. 11/01/15   Jerline Pain, MD  atorvastatin (LIPITOR) 10 MG tablet Take 10 mg by mouth daily after supper.     Historical Provider, MD  fenofibrate 160 MG tablet Take 160 mg by mouth daily after supper.     Historical Provider, MD  ferrous sulfate 325 (65 FE) MG EC tablet Take 325 mg by mouth daily after supper.     Historical Provider, MD  fluticasone (  FLONASE) 50 MCG/ACT nasal spray Place 2 sprays into both nostrils daily. 06/05/16   Collene Gobble, MD  ketoconazole (NIZORAL) 2 % cream Reported on 05/02/2015 01/24/15   Historical Provider, MD  levothyroxine (SYNTHROID, LEVOTHROID) 175 MCG tablet TAKE 1 TABLET BY MOUTH DAILY BEFORE BREAKFAST. 04/04/16   Philemon Kingdom, MD  losartan (COZAAR) 100 MG tablet Take 100 mg by mouth daily.    Historical Provider, MD  Multiple Vitamin (MULTIVITAMIN WITH MINERALS) TABS tablet Take 1 tablet by mouth daily.    Historical Provider, MD  Omega-3 Fatty Acids (FISH OIL PO) Take 1,960 mg by mouth 2 (two) times daily. 980 mg EPA-DHA per capsule    Historical Provider, MD  omeprazole (PRILOSEC OTC) 20 MG tablet Take 20 mg by mouth daily.    Historical Provider, MD  omeprazole (PRILOSEC) 20 MG capsule Take 1 capsule (20 mg total) by mouth 2 (two) times daily. 06/05/16   Collene Gobble, MD  propranolol (INDERAL) 60 MG tablet Take 2 tablets in the morning, 1 in the  afternoon, and 1 in the evening. 05/16/15   Jerline Pain, MD  ranitidine (ZANTAC) 150 MG tablet Take 150 mg by mouth daily after lunch.    Historical Provider, MD  Tiotropium Bromide Monohydrate (SPIRIVA RESPIMAT) 2.5 MCG/ACT AERS Inhale 2 puffs into the lungs daily. 12/31/15   Javier Glazier, MD    Family History Family History  Problem Relation Age of Onset  . Heart disease Mother   . Heart disease Father   . Heart failure Father     Social History Social History  Substance Use Topics  . Smoking status: Former Smoker    Packs/day: 2.00    Years: 30.00    Types: Cigarettes    Quit date: 01/30/1992  . Smokeless tobacco: Never Used  . Alcohol use 0.6 oz/week    1 Standard drinks or equivalent per week     Comment: wine occassionally     Allergies   Floxin [ofloxacin]   Review of Systems Review of Systems  Constitutional: Positive for fatigue. Negative for chills and fever.  HENT: Positive for rhinorrhea.   Respiratory: Positive for shortness of breath. Negative for cough and wheezing.   Cardiovascular: Positive for chest pain and leg swelling.  Gastrointestinal: Negative for abdominal pain, diarrhea, nausea and vomiting.  Genitourinary: Negative for dysuria, frequency and urgency.  Musculoskeletal: Positive for back pain.  Skin: Negative for wound.  Neurological: Negative for dizziness, syncope, weakness, light-headedness and headaches.     Physical Exam Updated Vital Signs BP 132/82 (BP Location: Right Arm)   Pulse 87   Temp 97.7 F (36.5 C) (Oral)   Resp 20   Ht 5\' 6"  (1.676 m)   Wt 119.7 kg   SpO2 95%   BMI 42.61 kg/m   Physical Exam  Constitutional: She is oriented to person, place, and time. She appears well-developed and well-nourished. No distress.  HENT:  Head: Normocephalic and atraumatic.  Mouth/Throat: Oropharynx is clear and moist.  Eyes: Conjunctivae are normal. Right eye exhibits no discharge. Left eye exhibits no discharge. No scleral  icterus.  Neck: Normal range of motion. Neck supple. No thyromegaly present.  Cardiovascular: Normal rate, normal heart sounds and intact distal pulses.  An irregularly irregular rhythm present. Exam reveals no gallop and no friction rub.   No murmur heard. Pulmonary/Chest: Breath sounds normal. She is in respiratory distress (mild). She has no wheezes.  Mild increased work of breathing. No accessory muscle use. No  retractions. She is hypoxic on room air 89%. Put on 2 L and improved to 92%. Mild tachypnea. Crackles heard at the bases of bilateral lungs.  Abdominal: Soft. Bowel sounds are normal. She exhibits no distension. There is no tenderness. There is no rebound and no guarding.  Musculoskeletal: Normal range of motion.  1+ pitting edema noted to the bilateral lower extremities up to the level of the shin. No calf tenderness.  Lymphadenopathy:    She has no cervical adenopathy.  Neurological: She is alert and oriented to person, place, and time.  Skin: Skin is warm and dry. Capillary refill takes less than 2 seconds.  Nursing note and vitals reviewed.    ED Treatments / Results  Labs (all labs ordered are listed, but only abnormal results are displayed) Labs Reviewed  BASIC METABOLIC PANEL - Abnormal; Notable for the following:       Result Value   Chloride 100 (*)    Glucose, Bld 121 (*)    All other components within normal limits  CBC - Abnormal; Notable for the following:    Hemoglobin 11.7 (*)    RDW 15.8 (*)    All other components within normal limits  BRAIN NATRIURETIC PEPTIDE - Abnormal; Notable for the following:    B Natriuretic Peptide 362.8 (*)    All other components within normal limits  I-STAT TROPOININ, ED    EKG  EKG Interpretation  Date/Time:  Monday June 09 2016 09:20:49 EDT Ventricular Rate:  87 PR Interval:    QRS Duration: 84 QT Interval:  370 QTC Calculation: 445 R Axis:   77 Text Interpretation:  Atrial fibrillation No significant change  since last tracing Confirmed by Maryan Rued  MD, Loree Fee ([id number]) on 06/09/2016 10:42:31 AM       Radiology Dg Chest 2 View  Result Date: 06/09/2016 CLINICAL DATA:  Chest pain and shortness breath for 1 week. Lymphoma and breast carcinoma. EXAM: CHEST  2 VIEW COMPARISON:  04/06/2015 FINDINGS: Stable mild cardiomegaly. New diffuse interstitial infiltrates, suspicious for pulmonary interstitial edema. No evidence of pulmonary consolidation or pleural effusion. IMPRESSION: New diffuse interstitial infiltrates, suspicious for pulmonary edema. Stable cardiomegaly Electronically Signed   By: Earle Gell M.D.   On: 06/09/2016 10:08    Procedures Procedures (including critical care time)  Medications Ordered in ED Medications  morphine 4 MG/ML injection 4 mg (not administered)  furosemide (LASIX) injection 40 mg (not administered)     Initial Impression / Assessment and Plan / ED Course  I have reviewed the triage vital signs and the nursing notes.  Pertinent labs & imaging results that were available during my care of the patient were reviewed by me and considered in my medical decision making (see chart for details).     Patient presents to the ED with complaints of ongoing chest pain and shortness of breath for the past. Also reports new associated dyspnea on exertion. Patient also endorses new onset lower extremity edema. Exam patient is hypoxic 88%. Placed on 2 L with improvement to 95%. Does have 1+ pitting edema to lower extremities. EKG shows A. fib with history of same and no change since prior tracing. Negative troponin. No leukocytosis. I'll let her lites are normal. BNP slightly elevated at 362. Chest x-ray shows diffuse interstitial infiltrates concerning for pulmonary edema. Patient with diffuse crackles bilateral lower lungs. Mild increased work of breathing with no respiratory distress. Patient is currently anticoagulated for atrial fibrillation low suspicion for PE. Denies any history  of  CAD or CHF. Low suspicion for ACS at this time. SOB and cp likely due to new onset pulmonary edema likely from chf. Lasix given in the ED. Pain medicine given for back pain. Vital signs remained stable. She is in no acute distress. Spoke with Dr. Barbaraann Faster tried hospitalist who agrees to admit patient. Admission orders were placed by him. Patient is hemodynamically stable. Seen and evaluated by Dr. Starla Link who is agreeable to the above plan. Patient updated with plan of care.  Final Clinical Impressions(s) / ED Diagnoses   Final diagnoses:  Acute respiratory failure with hypoxia (HCC)  Acute pulmonary edema (HCC)  SOB (shortness of breath)  Chest pain, unspecified type    New Prescriptions New Prescriptions   No medications on file     Doristine Devoid, PA-C 06/09/16 1127    Blanchie Dessert, MD 06/09/16 2045

## 2016-06-09 NOTE — H&P (Signed)
History and Physical    Kristin Hood ERX:[id number] DOB: 30-Nov-1940 DOA: 06/09/2016  PCP: Gennette Pac, MD Patient coming from: Home  Chief Complaint: SOB  HPI: Kristin Hood is a 76 y.o. female with medical history significant of A. Fib, breast cancer, COPD, CVA, hiatal hernia, hyperlipidemia, hypertension, presenting with a several week history of chest pain. Patient describes as being constant and nonexertional. States that it feels somewhat similar to previous reflux esophagitis. Currently pain is been resolved after receiving morphine in the ED. Patient states it waxes and wanes depending on the time of day. Sharp and described as a knife and the chest. Radiation to the back. Non-exertional. Patient does complain of some shortness of breath and dyspnea with exertion along with recent worsening lower extremity pitting edema. No home O2.Denies fevers, productive cough, palpitations, nausea, vomiting, diarrhea, dysuria, frequency, neck stiffness, headache, LOC.   ED Course: Morphine and Lasix given in ED. Objective findings outlined below.  Review of Systems: As per HPI otherwise 10 point review of systems negative.   Ambulatory Status: Restricted secondary to body habitus and deconditioning.  Past Medical History:  Diagnosis Date  . Anginal pain ([place])   . Arthritis   . Atrial fibrillation ([place]) 07/2012   Eliquis, rate controlled  . Breast cancer ([place]) 2001   left mastectomy  . Coarse tremors    , Essential  . Complication of anesthesia    SMALLER TUBE FOR INTUBATION AS GAGS ON TUBE  . COPD (chronic obstructive pulmonary disease) ([place])   . CVA (cerebral vascular accident) ([place]) 2013  . Dysrhythmia 08/20/2012   Atrial Fibrillation  . Family history of adverse reaction to anesthesia    mother experienced pain with intubation   . GERD (gastroesophageal reflux disease)    history of   . Hard of hearing   . Heart murmur   . History of chemotherapy 11/13/1998 to 2010     Adriamycin/Docotaxol, Dexorubicin/Taxotere, Femara, Tamoxifen  . History of hiatal hernia    history of   . Hx of adenomatous colonic polyps 2006   , Benign  . Hypercholesteremia   . Hypertension    , With mild concentric left ventricular hypertrophy  . Mixed dyslipidemia   . Shingles   . Shortness of breath dyspnea    on exertion   . Spider veins   . Stroke ([place]) 11/17/2011   left side brain-speech-TPA  . Thyroid nodule   . Tremor, essential   . Valvular sclerosis 09/23/2003   without stenosis  . Varicose veins     Past Surgical History:  Procedure Laterality Date  . APPENDECTOMY  07/10/1957  . BIOPSY THYROID  12/01/2013   ultrasound  . BREAST SURGERY    . BUNIONECTOMY  11/29/1988   bilateral with hammer toes  . CARDIAC CATHETERIZATION  07/2010   , Without significant CAD  . CARDIAC CATHETERIZATION  07/2011   [name]  . CESAREAN SECTION    . CESAREAN SECTION  05/1968, 07/1965  . EYE SURGERY  01/31/2013   eye lid; cataract surgery bilat   . JOINT REPLACEMENT  05/2001   left knee  . JOINT REPLACEMENT  11/2001   right knee  . JOINT REPLACEMENT  01/2008   redo right knee  . KNEE SURGERY  2009   ,Total knee replacement  . LEFT HEART CATHETERIZATION WITH CORONARY ANGIOGRAM N/A 08/14/2011   Procedure: LEFT HEART CATHETERIZATION WITH CORONARY ANGIOGRAM;  Surgeon: [name], MD;  Location[facility] CATH LAB;  Service: Cardiovascular;  Laterality: N/A;  . Left Rotator Cuff    . MASTECTOMY  10/19/1998   Radical, left -with lymph nodes (8 total)  . Reverse bunionectomy  08/29/1993   removal of Tibial Sesamoid-left  . TEE WITHOUT CARDIOVERSION  11/19/2011   Procedure: TRANSESOPHAGEAL ECHOCARDIOGRAM (TEE);  Surgeon: [name], MD;  Location: [facility] ENDOSCOPY;  Service: Cardiovascular;  Laterality: N/A;  . THYROIDECTOMY N/A 03/02/2015   Procedure: TOTAL THYROIDECTOMY;  Surgeon: Armandina Gemma, MD;  Location: [facility] ORS;  Service: General;  Laterality: N/A;  . TONSILLECTOMY    .  TOTAL HIP ARTHROPLASTY Left 10/11/2014   Procedure: LEFT TOTAL HIP ARTHROPLASTY ANTERIOR APPROACH;  Surgeon: [name], MD;  Location: [facility] ORS;  Service: Orthopedics;  Laterality: Left;    Social History   Social History  . Marital status: Married    Spouse name: N/A  . Number of children: N/A  . Years of education: N/A   Occupational History  . retired    Social History Main Topics  . Smoking status: Former Smoker    Packs/day: 2.00    Years: 30.00    Types: Cigarettes    Quit date: 01/30/1992  . Smokeless tobacco: Never Used  . Alcohol use 0.6 oz/week    1 Standard drinks or equivalent per week     Comment: wine occassionally  . Drug use: No  . Sexual activity: Not on file   Other Topics Concern  . Not on file   Social History Narrative  . No narrative on file    Allergies  Allergen Reactions  . Floxin [Ofloxacin] Nausea Only and Other (See Comments)    Dizziness, Vertigo    Family History  Problem Relation Age of Onset  . Heart disease Mother   . Heart disease Father   . Heart failure Father     Prior to Admission medications   Medication Sig Start Date End Date Taking? Authorizing Provider  albuterol (PROVENTIL HFA;VENTOLIN HFA) 108 (90 Base) MCG/ACT inhaler Inhale 2 puffs into the lungs every 4 (four) hours as needed for wheezing or shortness of breath. 01/09/16  Yes Collene Gobble, MD  amLODipine (NORVASC) 5 MG tablet Take 5 mg by mouth daily.   Yes Historical Provider, MD  apixaban (ELIQUIS) 5 MG TABS tablet Take 1 tablet (5 mg total) by mouth 2 (two) times daily. 11/01/15  Yes Jerline Pain, MD  atorvastatin (LIPITOR) 10 MG tablet Take 10 mg by mouth daily after supper.    Yes Historical Provider, MD  fenofibrate 160 MG tablet Take 160 mg by mouth daily after supper.    Yes Historical Provider, MD  ferrous sulfate 325 (65 FE) MG EC tablet Take 325 mg by mouth daily after supper.    Yes Historical Provider, MD  fluticasone (FLONASE) 50 MCG/ACT nasal spray  Place 2 sprays into both nostrils daily. 06/05/16  Yes Collene Gobble, MD  ketoconazole (NIZORAL) 2 % cream Reported on 05/02/2015 01/24/15  Yes Historical Provider, MD  levothyroxine (SYNTHROID, LEVOTHROID) 175 MCG tablet TAKE 1 TABLET BY MOUTH DAILY BEFORE BREAKFAST. 04/04/16  Yes Philemon Kingdom, MD  losartan (COZAAR) 100 MG tablet Take 100 mg by mouth daily.   Yes Historical Provider, MD  Multiple Vitamin (MULTIVITAMIN WITH MINERALS) TABS tablet Take 1 tablet by mouth daily.   Yes Historical Provider, MD  Omega-3 Fatty Acids (FISH OIL PO) Take 1,960 mg by mouth 2 (two) times daily. 980 mg EPA-DHA per capsule   Yes Historical Provider, MD  omeprazole (PRILOSEC OTC)  20 MG tablet Take 20 mg by mouth daily.   Yes Historical Provider, MD  omeprazole (PRILOSEC) 20 MG capsule Take 1 capsule (20 mg total) by mouth 2 (two) times daily. 06/05/16  Yes Collene Gobble, MD  propranolol (INDERAL) 60 MG tablet Take 2 tablets in the morning, 1 in the afternoon, and 1 in the evening. 05/16/15  Yes Jerline Pain, MD  ranitidine (ZANTAC) 150 MG tablet Take 150 mg by mouth daily after lunch.   Yes Historical Provider, MD  Tiotropium Bromide Monohydrate (SPIRIVA RESPIMAT) 2.5 MCG/ACT AERS Inhale 2 puffs into the lungs daily. 12/31/15  Yes Javier Glazier, MD    Physical Exam: Vitals:   06/09/16 0925 06/09/16 0933 06/09/16 0934 06/09/16 1023  BP: (!) 147/118 (!) 153/104  132/82  Pulse: 79  80 87  Resp: 20  25 20   Temp: 97.6 F (36.4 C)   97.7 F (36.5 C)  TempSrc: Oral   Oral  SpO2: 93%  (!) 89% 95%  Weight:      Height:         General: He is mildly anxious, sitting up in bed. Eyes:  PERRL, EOMI, normal lids, iris ENT:  grossly normal hearing, lips & tongue, mmm Neck:  no LAD, masses or thyromegaly Cardiovascular: 2/6 systolic murmur, 2+ bilateral lower extremity pitting edema, irregular rate and rhythm Respiratory: Diminished breath sounds in the bases bilaterally, increased effort, staccato  sentences. Abdomen:  soft, ntnd, NABS Skin:  no rash or induration seen on limited exam Musculoskeletal:  grossly normal tone BUE/BLE, good ROM, no bony abnormality Psychiatric:  grossly normal mood and affect, speech fluent and appropriate, AOx3 Neurologic:  CN 2-12 grossly intact, moves all extremities in coordinated fashion, sensation intact  Labs on Admission: I have personally reviewed following labs and imaging studies  CBC:  Recent Labs Lab 06/09/16 0928  WBC 5.3  HGB 11.7*  HCT 36.9  MCV 88.9  PLT 591   Basic Metabolic Panel:  Recent Labs Lab 06/09/16 0928  NA 135  K 4.6  CL 100*  CO2 27  GLUCOSE 121*  BUN 16  CREATININE 0.78  CALCIUM 9.7   GFR: Estimated Creatinine Clearance: 78.9 mL/min (by C-G formula based on SCr of 0.78 mg/dL). Liver Function Tests: No results for input(s): AST, ALT, ALKPHOS, BILITOT, PROT, ALBUMIN in the last 168 hours. No results for input(s): LIPASE, AMYLASE in the last 168 hours. No results for input(s): AMMONIA in the last 168 hours. Coagulation Profile: No results for input(s): INR, PROTIME in the last 168 hours. Cardiac Enzymes: No results for input(s): CKTOTAL, CKMB, CKMBINDEX, TROPONINI in the last 168 hours. BNP (last 3 results) No results for input(s): PROBNP in the last 8760 hours. HbA1C: No results for input(s): HGBA1C in the last 72 hours. CBG: No results for input(s): GLUCAP in the last 168 hours. Lipid Profile: No results for input(s): CHOL, HDL, LDLCALC, TRIG, CHOLHDL, LDLDIRECT in the last 72 hours. Thyroid Function Tests: No results for input(s): TSH, T4TOTAL, FREET4, T3FREE, THYROIDAB in the last 72 hours. Anemia Panel: No results for input(s): VITAMINB12, FOLATE, FERRITIN, TIBC, IRON, RETICCTPCT in the last 72 hours. Urine analysis:    Component Value Date/Time   COLORURINE YELLOW 10/04/2014 1457   APPEARANCEUR CLOUDY (A) 10/04/2014 1457   LABSPEC 1.025 10/04/2014 1457   PHURINE 6.5 10/04/2014 [place] 10/04/2014 1457   HGBUR NEGATIVE 10/04/2014 [place] 10/04/2014 [place] 10/04/2014 1457  PROTEINUR NEGATIVE 10/04/2014 1457   UROBILINOGEN 1.0 10/04/2014 1457   NITRITE NEGATIVE 10/04/2014 1457   LEUKOCYTESUR MODERATE (A) 10/04/2014 1457    Creatinine Clearance: Estimated Creatinine Clearance: 78.9 mL/min (by C-G formula based on SCr of 0.78 mg/dL).  Sepsis Labs: @LABRCNTIP (procalcitonin:4,lacticidven:4) )No results found for this or any previous visit (from the past 240 hour(s)).   Radiological Exams on Admission: Dg Chest 2 View  Result Date: 06/09/2016 CLINICAL DATA:  Chest pain and shortness breath for 1 week. Lymphoma and breast carcinoma. EXAM: CHEST  2 VIEW COMPARISON:  04/06/2015 FINDINGS: Stable mild cardiomegaly. New diffuse interstitial infiltrates, suspicious for pulmonary interstitial edema. No evidence of pulmonary consolidation or pleural effusion. IMPRESSION: New diffuse interstitial infiltrates, suspicious for pulmonary edema. Stable cardiomegaly Electronically Signed   By: Earle Gell M.D.   On: 06/09/2016 10:08    EKG: Independently reviewed. Afib, no ACS  Assessment/Plan Active Problems:   Acute respiratory failure with hypoxemia (HCC)   Acute respiratory failure w/ Hypoxemia: O2 sats 87% on RA w/ marked increased effort secondary to acute CHF. CXR concerning for CHF. BNP 362.8 with marked bilateral lower extremity pitting edema is new. Diuretic naive. Echo from 2016 showing EF of 12% and mild diastolic dysfunction. Doubt infectious etiology as patient is without any white count, afebrile and without significant respiratory symptoms suggestive of pneumonia. - IV 40 mg Lasix twice a day - Strict I's and O's, daily weights - Echo. - CHF orders and utilized - CT chest to better evaluate nodules - of note pt followed by oncology who was planning on repeat CT due to concerns for lymphoma.   CP: Pleuritic versus  cardiac versus gastrointestinal. EKG reassuring. Troponin negative. - Cycle troponins, telemetry, EKG in a.m. - Nitroglycerin, morphine  Afib: rate controlled. Chronic.  - continue Eliquis, bblocker  Hypothyroid: - continue synthroid  HTN: - continue propranolol, Norvasc, losartan  HLD: - lipitor, fenofibrate   DVT prophylaxis: Eliquis  Code Status: full  Family Communication: husband  Disposition Plan: pending improvement  Consults called: none  Admission status: observation - tele    MERRELL, DAVID J MD [facility]  If 7PM-7AM, please contact night-coverage [email or web address] Password [facility]  06/09/2016, 11:15 AM

## 2016-06-09 NOTE — ED Notes (Signed)
Pt. Placed on 2L oxygen via [place] for oxygen saturation ~87%.

## 2016-06-09 NOTE — Progress Notes (Signed)
Patient admitted from ED; vitals obtained and telemetry placed. Patient alert and oriented. NP notified regarding patient being admitted to [facility]. Skin WNL. Admission nurse called.

## 2016-06-09 NOTE — ED Triage Notes (Signed)
Pt reports left sided, sharp chest pain that began this morning, pt states pain radiates to back. Pt a/ox4, resp e/u, skin warm and dry, nad.

## 2016-06-10 ENCOUNTER — Observation Stay (HOSPITAL_BASED_OUTPATIENT_CLINIC_OR_DEPARTMENT_OTHER): Payer: PPO

## 2016-06-10 DIAGNOSIS — I482 Chronic atrial fibrillation: Secondary | ICD-10-CM | POA: Diagnosis not present

## 2016-06-10 DIAGNOSIS — Z7901 Long term (current) use of anticoagulants: Secondary | ICD-10-CM | POA: Diagnosis not present

## 2016-06-10 DIAGNOSIS — T501X5A Adverse effect of loop [high-ceiling] diuretics, initial encounter: Secondary | ICD-10-CM | POA: Diagnosis not present

## 2016-06-10 DIAGNOSIS — Z8673 Personal history of transient ischemic attack (TIA), and cerebral infarction without residual deficits: Secondary | ICD-10-CM | POA: Diagnosis not present

## 2016-06-10 DIAGNOSIS — E782 Mixed hyperlipidemia: Secondary | ICD-10-CM | POA: Diagnosis not present

## 2016-06-10 DIAGNOSIS — I5030 Unspecified diastolic (congestive) heart failure: Secondary | ICD-10-CM | POA: Diagnosis not present

## 2016-06-10 DIAGNOSIS — Z9012 Acquired absence of left breast and nipple: Secondary | ICD-10-CM | POA: Diagnosis not present

## 2016-06-10 DIAGNOSIS — J449 Chronic obstructive pulmonary disease, unspecified: Secondary | ICD-10-CM | POA: Diagnosis not present

## 2016-06-10 DIAGNOSIS — C8590 Non-Hodgkin lymphoma, unspecified, unspecified site: Secondary | ICD-10-CM | POA: Diagnosis not present

## 2016-06-10 DIAGNOSIS — E039 Hypothyroidism, unspecified: Secondary | ICD-10-CM | POA: Diagnosis not present

## 2016-06-10 DIAGNOSIS — R918 Other nonspecific abnormal finding of lung field: Secondary | ICD-10-CM | POA: Diagnosis not present

## 2016-06-10 DIAGNOSIS — Z9221 Personal history of antineoplastic chemotherapy: Secondary | ICD-10-CM | POA: Diagnosis not present

## 2016-06-10 DIAGNOSIS — J9600 Acute respiratory failure, unspecified whether with hypoxia or hypercapnia: Secondary | ICD-10-CM | POA: Diagnosis not present

## 2016-06-10 DIAGNOSIS — Z6841 Body Mass Index (BMI) 40.0 and over, adult: Secondary | ICD-10-CM | POA: Diagnosis not present

## 2016-06-10 DIAGNOSIS — I110 Hypertensive heart disease with heart failure: Secondary | ICD-10-CM | POA: Diagnosis not present

## 2016-06-10 DIAGNOSIS — Z853 Personal history of malignant neoplasm of breast: Secondary | ICD-10-CM | POA: Diagnosis not present

## 2016-06-10 DIAGNOSIS — R0602 Shortness of breath: Secondary | ICD-10-CM | POA: Diagnosis not present

## 2016-06-10 DIAGNOSIS — Y92230 Patient room in hospital as the place of occurrence of the external cause: Secondary | ICD-10-CM | POA: Diagnosis not present

## 2016-06-10 DIAGNOSIS — J9601 Acute respiratory failure with hypoxia: Secondary | ICD-10-CM | POA: Diagnosis not present

## 2016-06-10 DIAGNOSIS — I2510 Atherosclerotic heart disease of native coronary artery without angina pectoris: Secondary | ICD-10-CM | POA: Diagnosis not present

## 2016-06-10 DIAGNOSIS — K219 Gastro-esophageal reflux disease without esophagitis: Secondary | ICD-10-CM | POA: Diagnosis not present

## 2016-06-10 DIAGNOSIS — I10 Essential (primary) hypertension: Secondary | ICD-10-CM | POA: Diagnosis not present

## 2016-06-10 DIAGNOSIS — N179 Acute kidney failure, unspecified: Secondary | ICD-10-CM | POA: Diagnosis not present

## 2016-06-10 DIAGNOSIS — R079 Chest pain, unspecified: Secondary | ICD-10-CM | POA: Diagnosis not present

## 2016-06-10 DIAGNOSIS — E860 Dehydration: Secondary | ICD-10-CM | POA: Diagnosis not present

## 2016-06-10 DIAGNOSIS — J810 Acute pulmonary edema: Secondary | ICD-10-CM | POA: Diagnosis not present

## 2016-06-10 DIAGNOSIS — I5032 Chronic diastolic (congestive) heart failure: Secondary | ICD-10-CM

## 2016-06-10 DIAGNOSIS — I509 Heart failure, unspecified: Secondary | ICD-10-CM

## 2016-06-10 DIAGNOSIS — R599 Enlarged lymph nodes, unspecified: Secondary | ICD-10-CM | POA: Diagnosis not present

## 2016-06-10 DIAGNOSIS — H9190 Unspecified hearing loss, unspecified ear: Secondary | ICD-10-CM | POA: Diagnosis not present

## 2016-06-10 DIAGNOSIS — Z87891 Personal history of nicotine dependence: Secondary | ICD-10-CM | POA: Diagnosis not present

## 2016-06-10 HISTORY — DX: Heart failure, unspecified: I50.9

## 2016-06-10 LAB — BASIC METABOLIC PANEL
ANION GAP: 10 (ref 5–15)
BUN: 19 mg/dL (ref 6–20)
CALCIUM: 9.4 mg/dL (ref 8.9–10.3)
CO2: 31 mmol/L (ref 22–32)
Chloride: 93 mmol/L — ABNORMAL LOW (ref 101–111)
Creatinine, Ser: 0.87 mg/dL (ref 0.44–1.00)
GFR calc non Af Amer: >60 mL/min (ref >60)
Glucose, Bld: 96 mg/dL (ref 65–99)
Potassium: 3.5 mmol/L (ref 3.5–5.1)
Sodium: 134 mmol/L — ABNORMAL LOW (ref 135–145)

## 2016-06-10 LAB — CBC
HCT: 33.7 % — ABNORMAL LOW (ref 36.0–46.0)
HEMOGLOBIN: 10.5 g/dL — AB (ref 12.0–15.0)
MCH: 27.4 pg (ref 26.0–34.0)
MCHC: 31.2 g/dL (ref 30.0–36.0)
MCV: 88 fL (ref 78.0–100.0)
Platelets: 297 10*3/uL (ref 150–400)
RBC: 3.83 MIL/uL — AB (ref 3.87–5.11)
RDW: 15.3 % (ref 11.5–15.5)
WBC: 4.4 10*3/uL (ref 4.0–10.5)

## 2016-06-10 LAB — TROPONIN I

## 2016-06-10 LAB — ECHOCARDIOGRAM COMPLETE
HEIGHTINCHES: 66 in
Weight: 4016 oz

## 2016-06-10 MED ORDER — FLUTICASONE PROPIONATE 50 MCG/ACT NA SUSP
1.0000 | Freq: Every day | NASAL | Status: DC
  Administered 2016-06-10 – 2016-06-12 (×3): 1 via NASAL
  Filled 2016-06-10: qty 16

## 2016-06-10 NOTE — Progress Notes (Signed)
Occupational Therapy Evaluation Patient Details Name: Kristin Hood MRN: [id number] DOB: 1940-04-08 Today's Date: 06/10/2016    History of Present Illness Pt adm with chest pain and found to have acute hypoxic respiratory failure and acute heart failure. PMH - lt THR, bil TKR, COPD, CVA, HTN, obesity, breast CA, afib   Clinical Impression   PTA, pt independent with ADL and mobility. Pt close to baseline level of function. Pt states she gets SOB with activity. Completed education regarding AE/DME and compensatory techniques for ADL and use of energy conservation to improve ability to complete ADL and IADL tasks. Written information given and reviewed. Pt verbalized understanding. O2 Sats 91 on RA during activity. Dyspnea 2/4. Pt safe to DC home with intermittent S when medically stable. OT signing off.     Follow Up Recommendations  No OT follow up;Supervision - Intermittent    Equipment Recommendations  None recommended by OT    Recommendations for Other Services       Precautions / Restrictions Precautions Precautions: None Restrictions Weight Bearing Restrictions: No      Mobility Bed Mobility               General bed mobility comments: Pt OOB  Transfers Overall transfer level: Independent                    Balance Overall balance assessment: Modified Independent                                          ADL Overall ADL's : Modified independent                                       General ADL Comments: Pt states she gets SOB with LB ADL. Educated pt on use of AE to assist with LB ADL. Pt able to return demonstrate. Also educated on energy conservation strategies. Pt given handout and verbalized understandign. Recommended for pt to use shower seat when bathing.                          Pertinent Vitals/Pain Pain Assessment: No/denies pain     Hand Dominance Right   Extremity/Trunk Assessment Upper  Extremity Assessment Upper Extremity Assessment: Overall WFL for tasks assessed   Lower Extremity Assessment Lower Extremity Assessment: Overall WFL for tasks assessed   Cervical / Trunk Assessment Cervical / Trunk Assessment: Normal   Communication Communication Communication: No difficulties   Cognition Arousal/Alertness: Awake/alert Behavior During Therapy: WFL for tasks assessed/performed Overall Cognitive Status: Within Functional Limits for tasks assessed                                        Home Living Family/patient expects to be discharged to:: Private residence Living Arrangements: Spouse/significant other Available Help at Discharge: Family Type of Home: House Home Access: Stairs to enter Technical brewer of Steps: 1 Entrance Stairs-Rails: None Home Layout: One level     Bathroom Shower/Tub: Occupational psychologist: Standard Bathroom Accessibility: Yes How Accessible: Accessible via walker Home Equipment: Walker - standard;Shower seat          Prior Functioning/Environment Level of Independence:  Independent        Comments: Husband reports pt sedentary due to SOB        OT Problem List: Decreased activity tolerance;Decreased knowledge of use of DME or AE;Cardiopulmonary status limiting activity;Obesity      OT Treatment/Interventions:      OT Goals(Current goals can be found in the care plan section) Acute Rehab OT Goals Patient Stated Goal: to be able to do more things OT Goal Formulation: All assessment and education complete, DC therapy  OT Frequency:     Barriers to D/C:            Co-evaluation              End of Session Nurse Communication: Mobility status  Activity Tolerance: Patient tolerated treatment well Patient left: in chair;with call bell/phone within reach  OT Visit Diagnosis: Muscle weakness (generalized) (M62.81)                ADL either performed or assessed with clinical  judgement  Time: 1135-1159 OT Time Calculation (min): 24 min Charges:  OT General Charges $OT Visit: 1 Procedure OT Evaluation $OT Eval Low Complexity: 1 Procedure G-Codes:     Myrick Mcnairy, OT/L  [phone] 06/10/2016  Loyd [place],HILLARY 06/10/2016, 3:19 PM

## 2016-06-10 NOTE — Progress Notes (Signed)
PROGRESS NOTE    Kristin Hood  PNT:[id number] DOB: [birth year] DOA: 06/09/2016 PCP: Gennette Pac, MD  Brief Narrative: Kristin Hood is a 76 y.o. female with medical history significant of A. 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 Chronic.  - continue Eliquis, bblocker  Hypothyroid: - continue synthroid  HTN: - continue propranolol, Norvasc, losartan  HLD: - lipitor, fenofibrate  DVT prophylaxis: Eliquis  Code Status: full  Family Communication: husband  Disposition Plan: Home in 2-3days   Subjective: Feels better, no chest pain this am, breathing improing  Objective: Vitals:   06/10/16 0038 06/10/16 0505 06/10/16 0815 06/10/16 0942  BP: 135/78 139/65 (!) 108/56   Pulse: 78 70 83   Resp: 16 20 18    Temp: 97.6 F (36.4 C) 97.9 F (36.6 C) 97.8 F (36.6 C)   TempSrc: Oral Oral Oral   SpO2: 96% 95% 98% 96%  Weight:  113.9 kg (251 lb)    Height:        Intake/Output Summary (Last 24 hours) at 06/10/16 1044 Last data filed at 06/10/16 0917  Gross per 24 hour  Intake             1260 ml  Output             2500 ml  Net            -1240  ml   Filed Weights   06/09/16 0922 06/09/16 1353 06/10/16 0505  Weight: 119.7 kg (264 lb) 116.4 kg (256 lb 9.6 oz) 113.9 kg (251 lb)    Examination:  General exam: Appears calm and comfortable, no distress Respiratory system: crackles in both lower lungs Cardiovascular system: S1 & S2 heard, RRR. No JVD, murmurs Gastrointestinal system: Abdomen is nondistended, soft and nontender. Normal bowel sounds heard. Central nervous system: Alert and oriented. No focal neurological deficits. Extremities: Symmetric 5 x 5 power, 1-2plus edema Skin: No rashes, lesions or ulcers Psychiatry: Judgement and insight appear normal. Mood & affect appropriate.     Data Reviewed: I have personally reviewed following labs and imaging studies  CBC:  Recent Labs Lab 06/09/16 0928 06/10/16 0042  WBC 5.3 4.4  HGB 11.7* 10.5*  HCT 36.9 33.7*  MCV 88.9 88.0  PLT 276 086   Basic Metabolic Panel:  Recent Labs Lab 06/09/16 0928 06/10/16 0042  NA 135 134*  K 4.6 3.5  CL 100* 93*  CO2 27 31  GLUCOSE 121* 96  BUN 16 19  CREATININE 0.78 0.87  CALCIUM 9.7 9.4   GFR: Estimated Creatinine Clearance: 70.4 mL/min (by C-G formula based on SCr of 0.87 mg/dL). Liver Function Tests: No results for input(s): AST, ALT, ALKPHOS, BILITOT, PROT, ALBUMIN in the last 168 hours. No results for input(s):  LIPASE, AMYLASE in the last 168 hours. No results for input(s): AMMONIA in the last 168 hours. Coagulation Profile: No results for input(s): INR, PROTIME in the last 168 hours. Cardiac Enzymes:  Recent Labs Lab 06/09/16 1810 06/09/16 2100 06/10/16 0036  TROPONINI <0.03 <0.03 <0.03   BNP (last 3 results) No results for input(s): PROBNP in the last 8760 hours. HbA1C: No results for input(s): HGBA1C in the last 72 hours. CBG: No results for input(s): GLUCAP in the last 168 hours. Lipid Profile: No results for input(s): CHOL, HDL, LDLCALC, TRIG, CHOLHDL, LDLDIRECT in the last 72 hours. Thyroid  Function Tests: No results for input(s): TSH, T4TOTAL, FREET4, T3FREE, THYROIDAB in the last 72 hours. Anemia Panel: No results for input(s): VITAMINB12, FOLATE, FERRITIN, TIBC, IRON, RETICCTPCT in the last 72 hours. Urine analysis:    Component Value Date/Time   COLORURINE YELLOW 10/04/2014 1457   APPEARANCEUR CLOUDY (A) 10/04/2014 1457   LABSPEC 1.025 10/04/2014 1457   PHURINE 6.5 10/04/2014 1457   GLUCOSEU NEGATIVE 10/04/2014 1457   HGBUR NEGATIVE 10/04/2014 1457   [place] 10/04/2014 [place] 10/04/2014 1457   PROTEINUR NEGATIVE 10/04/2014 1457   UROBILINOGEN 1.0 10/04/2014 1457   NITRITE NEGATIVE 10/04/2014 1457   LEUKOCYTESUR MODERATE (A) 10/04/2014 1457   Sepsis Labs: @LABRCNTIP (procalcitonin:4,lacticidven:4)  )No results found for this or any previous visit (from the past 240 hour(s)).       Radiology Studies: Dg Chest 2 View  Result Date: 06/09/2016 CLINICAL DATA:  Chest pain and shortness breath for 1 week. Lymphoma and breast carcinoma. EXAM: CHEST  2 VIEW COMPARISON:  04/06/2015 FINDINGS: Stable mild cardiomegaly. New diffuse interstitial infiltrates, suspicious for pulmonary interstitial edema. No evidence of pulmonary consolidation or pleural effusion. IMPRESSION: New diffuse interstitial infiltrates, suspicious for pulmonary edema. Stable cardiomegaly Electronically Signed   By: Earle Gell M.D.   On: 06/09/2016 10:08   Ct Chest W Contrast  Result Date: 06/09/2016 CLINICAL DATA:  Pulmonary nodules, shortness of breath, and cough. History of lymphoma and breast cancer. EXAM: CT CHEST WITH CONTRAST TECHNIQUE: Multidetector CT imaging of the chest was performed during intravenous contrast administration. CONTRAST:  61mL ISOVUE-300 IOPAMIDOL (ISOVUE-300) INJECTION 61% COMPARISON:  CT of the chest July 16, 2015 and chest x-ray from earlier today FINDINGS: Cardiovascular: The thoracic aorta is non aneurysmal with no dissection. Cardiomegaly  is identified. Coronary artery calcifications are noted. The central pulmonary arteries are unremarkable. Opacification limits evaluation for pulmonary emboli but no central emboli are identified. Mediastinum/Nodes: The patient is status post left mastectomy. The low left jugular/ supraclavicular adenopathy on the previous study measures 9 mm on both studies. An adjacent node on image 17 measures 11 mm in short axis today versus 9 mm previously. The subcarinal node measures 2.3 cm today versus 1.8 cm previously. The left infrahilar node on image 77 measures 11 mm today versus 10 mm previously. The right paraesophageal adenopathy measures 2.7 cm on both studies. There is a tiny hiatal hernia. The esophagus is otherwise normal. A right pretracheal node on image 56 is more prominent in the interval as is another on image 62. A tiny left pleural effusion and a small moderate right pleural effusion are identified. No pericardial effusion. Lungs/Pleura: The central airways are within normal limits. Bilateral ground-glass opacities are more prominent the interval. I suspect mild edema. No pneumothorax. The 6 mm nodule in the lingula seen on the previous study measures 5 mm on image 74 today. Right middle lobe and lingular atelectasis.  Bilateral pleural effusions, right greater than left, with associated compressive atelectasis. No infiltrate to suggest pneumonia. Upper Abdomen: Shotty gastrohepatic nodes on image 134 slightly more prominent the interval. Spleen is prominent but incompletely evaluated. No other abnormalities seen in the upper abdomen. Musculoskeletal: No chest wall abnormality. No acute or significant osseous findings. IMPRESSION: 1. Several lymph nodes in the base of the neck on the left and the mediastinum are mildly larger in the interval. These nodes are nonspecific but the patient has a reported history of lymphoma. 2. New bilateral pleural effusions and pulmonary edema. 3. Stable lingular nodule. 4.  Mildly more prominent gastrohepatic ligament lymph nodes. 5. The spleen is prominent but incompletely evaluated. This finding is grossly stable on limited views. Electronically Signed   By: Dorise Bullion III M.D   On: 06/09/2016 17:09        Scheduled Meds: . amLODipine  5 mg Oral Daily  . apixaban  5 mg Oral BID  . atorvastatin  10 mg Oral QPC supper  . famotidine  20 mg Oral Daily  . fenofibrate  160 mg Oral QPC supper  . furosemide  40 mg Intravenous BID  . levothyroxine  175 mcg Oral QAC breakfast  . losartan  100 mg Oral Daily  . mouth rinse  15 mL Mouth Rinse BID  . pantoprazole  40 mg Oral Daily  . propranolol  60 mg Oral BID  . sodium chloride flush  3 mL Intravenous Q12H  . tiotropium  18 mcg Inhalation Daily   Continuous Infusions:   LOS: 0 days    Time spent: 44min    Domenic Polite, MD [facility] Pager [phone]  If 7PM-7AM, please contact night-coverage [email or web address] Password [facility] 06/10/2016, 10:44 AM

## 2016-06-10 NOTE — Discharge Instructions (Signed)
Information on my medicine - ELIQUIS (apixaban)  This medication education was reviewed with me or my healthcare representative as part of my discharge preparation.   Why was Eliquis prescribed for you? Eliquis was prescribed for you to reduce the risk of forming blood clots that can cause a stroke if you have a medical condition called atrial fibrillation (a type of irregular heartbeat) OR to reduce the risk of a blood clots forming after orthopedic surgery.  What do You need to know about Eliquis ? Take your Eliquis 5 TWICE DAILY - one tablet in the morning and one tablet in the evening with or without food.  It would be best to take the doses about the same time each day.  If you have difficulty swallowing the tablet whole please discuss with your pharmacist how to take the medication safely.  Take Eliquis exactly as prescribed by your doctor and DO NOT stop taking Eliquis without talking to the doctor who prescribed the medication.  Stopping may increase your risk of developing a new clot or stroke.  Refill your prescription before you run out.  After discharge, you should have regular check-up appointments with your healthcare provider that is prescribing your Eliquis.  In the future your dose may need to be changed if your kidney function or weight changes by a significant amount or as you get older.  What do you do if you miss a dose? If you miss a dose, take it as soon as you remember on the same day and resume taking twice daily.  Do not take more than one dose of ELIQUIS at the same time.  Important Safety Information A possible side effect of Eliquis is bleeding. You should call your healthcare provider right away if you experience any of the following: ? Bleeding from an injury or your nose that does not stop. ? Unusual colored urine (red or dark brown) or unusual colored stools (red or black). ? Unusual bruising for unknown reasons. ? A serious fall or if you hit your  head (even if there is no bleeding).  Some medicines may interact with Eliquis and might increase your risk of bleeding or clotting while on Eliquis. To help avoid this, consult your healthcare provider or pharmacist prior to using any new prescription or non-prescription medications, including herbals, vitamins, non-steroidal anti-inflammatory drugs (NSAIDs) and supplements.  This website has more information on Eliquis (apixaban): [email or web address].

## 2016-06-10 NOTE — Progress Notes (Signed)
Echocardiogram 2D Echocardiogram has been performed.  Kristin Hood 06/10/2016, 9:27 AM

## 2016-06-10 NOTE — Evaluation (Signed)
Physical Therapy Evaluation Patient Details Name: Kristin Hood MRN: [id number] DOB: [birth year] Today's Date: 06/10/2016   History of Present Illness  Pt adm with chest pain and found to have acute hypoxic respiratory failure and acute heart failure. PMH - lt THR, bil TKR, COPD, CVA, HTN, obesity, breast CA, afib  Clinical Impression  Pt doing well with mobility and no further PT needed.  Encouraged pt to continue amb in hallways while she is here.      Follow Up Recommendations No PT follow up    Equipment Recommendations  None recommended by PT    Recommendations for Other Services       Precautions / Restrictions Precautions Precautions: None Restrictions Weight Bearing Restrictions: No      Mobility  Bed Mobility               General bed mobility comments: Pt OOB  Transfers Overall transfer level: Independent                  Ambulation/Gait Ambulation/Gait assistance: Modified independent (Device/Increase time) Ambulation Distance (Feet): 275 Feet Assistive device: None Gait Pattern/deviations: Step-through pattern;Wide base of support   Gait velocity interpretation: at or above normal speed for age/gender General Gait Details: Steady gait. Amb on RA with SpO2 >91%  Stairs            Wheelchair Mobility    Modified Rankin (Stroke Patients Only)       Balance Overall balance assessment: Modified Independent                                           Pertinent Vitals/Pain Pain Assessment: No/denies pain    Home Living Family/patient expects to be discharged to:: Private residence Living Arrangements: Spouse/significant other Available Help at Discharge: Family Type of Home: House Home Access: Stairs to enter Entrance Stairs-Rails: None Technical brewer of Steps: 1 Home Layout: One level Home Equipment: Environmental consultant - standard      Prior Function Level of Independence: Independent          Comments: Husband reports pt sedentary due to SOB     Hand Dominance   Dominant Hand: Right    Extremity/Trunk Assessment   Upper Extremity Assessment Upper Extremity Assessment: Defer to OT evaluation    Lower Extremity Assessment Lower Extremity Assessment: Overall WFL for tasks assessed       Communication   Communication: No difficulties  Cognition Arousal/Alertness: Awake/alert Behavior During Therapy: WFL for tasks assessed/performed Overall Cognitive Status: Within Functional Limits for tasks assessed                      General Comments      Exercises     Assessment/Plan    PT Assessment Patent does not need any further PT services  PT Problem List         PT Treatment Interventions      PT Goals (Current goals can be found in the Care Plan section)  Acute Rehab PT Goals PT Goal Formulation: All assessment and education complete, DC therapy    Frequency     Barriers to discharge        Co-evaluation               End of Session   Activity Tolerance: Patient tolerated treatment well Patient left: in chair;with call bell/phone within  reach;with family/visitor present Nurse Communication: Mobility status PT Visit Diagnosis: Difficulty in walking, not elsewhere classified (R26.2)    Functional Assessment Tool Used: AM-PAC 6 Clicks Basic Mobility Functional Limitation: Mobility: Walking and moving around Mobility: Walking and Moving Around Current Status (H6073): At least 1 percent but less than 20 percent impaired, limited or restricted Mobility: Walking and Moving Around Goal Status [phone]): At least 1 percent but less than 20 percent impaired, limited or restricted Mobility: Walking and Moving Around Discharge Status [phone]): At least 1 percent but less than 20 percent impaired, limited or restricted    Time: 1005-1021 PT Time Calculation (min) (ACUTE ONLY): 16 min   Charges:   PT Evaluation $PT Eval Low Complexity: 1  Procedure     PT G Codes:   PT G-Codes **NOT FOR INPATIENT CLASS** Functional Assessment Tool Used: AM-PAC 6 Clicks Basic Mobility Functional Limitation: Mobility: Walking and moving around Mobility: Walking and Moving Around Current Status (I6270): At least 1 percent but less than 20 percent impaired, limited or restricted Mobility: Walking and Moving Around Goal Status [phone]): At least 1 percent but less than 20 percent impaired, limited or restricted Mobility: Walking and Moving Around Discharge Status [phone]): At least 1 percent but less than 20 percent impaired, limited or restricted     [place] 06/10/2016, 10:58 AM Suanne Marker PT [phone]

## 2016-06-10 NOTE — Progress Notes (Signed)
Patient complains of 10/10 sharp mid back pain. Morphine given. Patient anxious and wants MD to know about pain. MD made aware. Per MD, give another dose of morphine if no improvement in pain. [name] made aware about patient admission to hospital per Dr. Broadus John. No further interventions at this time. Patient made aware of above.

## 2016-06-11 DIAGNOSIS — N179 Acute kidney failure, unspecified: Secondary | ICD-10-CM

## 2016-06-11 DIAGNOSIS — J9600 Acute respiratory failure, unspecified whether with hypoxia or hypercapnia: Secondary | ICD-10-CM

## 2016-06-11 DIAGNOSIS — J810 Acute pulmonary edema: Secondary | ICD-10-CM

## 2016-06-11 DIAGNOSIS — I10 Essential (primary) hypertension: Secondary | ICD-10-CM

## 2016-06-11 DIAGNOSIS — R0602 Shortness of breath: Secondary | ICD-10-CM

## 2016-06-11 HISTORY — DX: Acute kidney failure, unspecified: N17.9

## 2016-06-11 LAB — PROCALCITONIN

## 2016-06-11 LAB — BASIC METABOLIC PANEL
ANION GAP: 12 (ref 5–15)
BUN: 27 mg/dL — ABNORMAL HIGH (ref 6–20)
CO2: 30 mmol/L (ref 22–32)
Calcium: 9.3 mg/dL (ref 8.9–10.3)
Chloride: 94 mmol/L — ABNORMAL LOW (ref 101–111)
Creatinine, Ser: 1.13 mg/dL — ABNORMAL HIGH (ref 0.44–1.00)
GFR, EST AFRICAN AMERICAN: 53 mL/min — AB (ref >60)
GFR, EST NON AFRICAN AMERICAN: 46 mL/min — AB (ref >60)
Glucose, Bld: 101 mg/dL — ABNORMAL HIGH (ref 65–99)
POTASSIUM: 3.6 mmol/L (ref 3.5–5.1)
Sodium: 136 mmol/L (ref 135–145)

## 2016-06-11 MED ORDER — TRAZODONE HCL 50 MG PO TABS
25.0000 mg | ORAL_TABLET | Freq: Every day | ORAL | Status: DC
  Administered 2016-06-11: 25 mg via ORAL
  Filled 2016-06-11: qty 1

## 2016-06-11 NOTE — Progress Notes (Signed)
PT ambulated in the hallway with no difficulties.

## 2016-06-11 NOTE — Progress Notes (Signed)
PROGRESS NOTE    Kristin Hood  OFB:[id number] DOB: [birth year] DOA: 06/09/2016 PCP: Gennette Pac, MD    Subjective: Developed acute renal failure, breathing about the same as yesterday.  Brief Narrative: Kristin Hood is a 76 y.o. female with medical history significant of A. 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 CXR and CT chest with CHF/Pleural effusions and slight worsening of adenopathy  Assessment & Plan:   Acute respiratory failure w/ Hypoxemia:  -Due to acute CHF, 2-D echo showed LVEF of 55-60%. This is resolved, currently O2 sats mid 90s on room air. -Was on IV Lasix, developed acute renal failure, Lasix and ACEI held. -Ambulate today, check BMP in a.m.  Acute renal failure -Developed acute renal failure with creatinine of 1.13 this morning, BUN is elevated at 27 suggesting dehydration. -This is likely secondary to overdiuresis, hold Lasix. -Check BMP in a.m.  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 Chronic.  -Continue Eliquis, beta blockers -CHA2DS2-VASc is 4 secondary to HTN, CHF, female and age  Hypothyroid: - continue synthroid  HTN: - continue propranolol, Norvasc, losartan  HLD: - lipitor, fenofibrate   DVT prophylaxis: Eliquis  Code Status: full  Family Communication:  Disposition Plan: Likely to be discharged home in 1-2 days.    Objective: Vitals:   06/10/16 2020 06/11/16 0025 06/11/16 0700 06/11/16 0818  BP: (!) 107/47 140/76  111/88   Pulse: 78 88 82 77  Resp: (!) 22 18  20   Temp: 97.7 F (36.5 C) 97.5 F (36.4 C) 97.7 F (36.5 C)   TempSrc: Oral Oral Oral   SpO2: 93% 96% 94% 93%  Weight:      Height:        Intake/Output Summary (Last 24 hours) at 06/11/16 0952 Last data filed at 06/11/16 0943  Gross per 24 hour  Intake              960 ml  Output              850 ml  Net              110 ml   Filed Weights   06/09/16 0922 06/09/16 1353 06/10/16 0505  Weight: 119.7 kg (264 lb) 116.4 kg (256 lb 9.6 oz) 113.9 kg (251 lb)    Examination:  General exam: Appears calm and comfortable, no distress Respiratory system: crackles in both lower lungs Cardiovascular system: S1 & S2 heard, RRR. No JVD, murmurs Gastrointestinal system: Abdomen is nondistended, soft and nontender. Normal bowel sounds heard. Central nervous system: Alert and oriented. No focal neurological deficits. Extremities: Symmetric 5 x 5 power, 1-2plus edema Skin: No rashes, lesions or ulcers Psychiatry: Judgement and insight appear normal. Mood & affect appropriate.     Data Reviewed: I have personally reviewed following labs and imaging studies  CBC:  Recent Labs Lab 06/09/16 0928 06/10/16 0042  WBC 5.3 4.4  HGB 11.7* 10.5*  HCT 36.9 33.7*  MCV 88.9 88.0  PLT 276 782   Basic Metabolic Panel:  Recent Labs Lab 06/09/16 0928 06/10/16 0042  06/11/16 0529  NA 135 134* 136  K 4.6 3.5 3.6  CL 100* 93* 94*  CO2 27 31 30   GLUCOSE 121* 96 101*  BUN 16 19 27*  CREATININE 0.78 0.87 1.13*  CALCIUM 9.7 9.4 9.3   GFR: Estimated Creatinine Clearance: 54.2 mL/min (by C-G formula based on SCr of 1.13 mg/dL (H)). Liver Function Tests: No results for input(s): AST, ALT, ALKPHOS, BILITOT, PROT, ALBUMIN in the last 168 hours. No results for input(s): LIPASE, AMYLASE in the last 168 hours. No results for input(s): AMMONIA in the last 168 hours. Coagulation Profile: No results for input(s): INR, PROTIME in the last 168  hours. Cardiac Enzymes:  Recent Labs Lab 06/09/16 1810 06/09/16 2100 06/10/16 0036  TROPONINI <0.03 <0.03 <0.03   BNP (last 3 results) No results for input(s): PROBNP in the last 8760 hours. HbA1C: No results for input(s): HGBA1C in the last 72 hours. CBG: No results for input(s): GLUCAP in the last 168 hours. Lipid Profile: No results for input(s): CHOL, HDL, LDLCALC, TRIG, CHOLHDL, LDLDIRECT in the last 72 hours. Thyroid Function Tests: No results for input(s): TSH, T4TOTAL, FREET4, T3FREE, THYROIDAB in the last 72 hours. Anemia Panel: No results for input(s): VITAMINB12, FOLATE, FERRITIN, TIBC, IRON, RETICCTPCT in the last 72 hours. Urine analysis:    Component Value Date/Time   COLORURINE YELLOW 10/04/2014 1457   APPEARANCEUR CLOUDY (A) 10/04/2014 1457   LABSPEC 1.025 10/04/2014 1457   PHURINE 6.5 10/04/2014 1457   GLUCOSEU NEGATIVE 10/04/2014 1457   HGBUR NEGATIVE 10/04/2014 1457   [place] 10/04/2014 [place] 10/04/2014 1457   PROTEINUR NEGATIVE 10/04/2014 1457   UROBILINOGEN 1.0 10/04/2014 1457   NITRITE NEGATIVE 10/04/2014 1457   LEUKOCYTESUR MODERATE (A) 10/04/2014 1457   Sepsis Labs: @LABRCNTIP (procalcitonin:4,lacticidven:4)  )No results found for this or any previous visit (from the past 240 hour(s)).       Radiology Studies: Dg Chest 2 View  Result Date: 06/09/2016 CLINICAL DATA:  Chest pain and shortness breath for 1 week. Lymphoma and breast carcinoma. EXAM: CHEST  2 VIEW COMPARISON:  04/06/2015 FINDINGS: Stable mild cardiomegaly. New diffuse interstitial infiltrates, suspicious for pulmonary interstitial edema. No evidence of pulmonary consolidation or pleural effusion. IMPRESSION: New diffuse interstitial infiltrates, suspicious for pulmonary edema. Stable cardiomegaly Electronically Signed   By: Earle Gell M.D.   On: 06/09/2016 10:08   Ct Chest W Contrast  Result Date: 06/09/2016 CLINICAL DATA:  Pulmonary nodules,  shortness of breath, and cough. History of lymphoma and breast cancer. EXAM: CT CHEST WITH CONTRAST TECHNIQUE: Multidetector CT imaging of the chest was performed during intravenous contrast administration. CONTRAST:  12mL ISOVUE-300 IOPAMIDOL (ISOVUE-300) INJECTION 61% COMPARISON:  CT of the chest July 16, 2015 and chest x-ray from earlier today FINDINGS: Cardiovascular: The thoracic aorta is non aneurysmal with no dissection. Cardiomegaly is identified. Coronary artery calcifications are noted. The central pulmonary arteries are unremarkable. Opacification limits evaluation for pulmonary emboli but no central emboli are identified. Mediastinum/Nodes: The patient is status post left mastectomy. The low left jugular/ supraclavicular adenopathy on the previous study measures 9 mm on both studies. An adjacent node on image 17 measures 11 mm in short axis today versus 9 mm previously. The subcarinal node measures 2.3 cm today versus 1.8 cm previously. The left infrahilar node on image 77 measures 11 mm today versus 10 mm previously. The right paraesophageal adenopathy measures 2.7 cm on both studies. There is a tiny hiatal hernia. The esophagus is otherwise normal. A  right pretracheal node on image 56 is more prominent in the interval as is another on image 62. A tiny left pleural effusion and a small moderate right pleural effusion are identified. No pericardial effusion. Lungs/Pleura: The central airways are within normal limits. Bilateral ground-glass opacities are more prominent the interval. I suspect mild edema. No pneumothorax. The 6 mm nodule in the lingula seen on the previous study measures 5 mm on image 74 today. Right middle lobe and lingular atelectasis. Bilateral pleural effusions, right greater than left, with associated compressive atelectasis. No infiltrate to suggest pneumonia. Upper Abdomen: Shotty gastrohepatic nodes on image 134 slightly more prominent the interval. Spleen is prominent but  incompletely evaluated. No other abnormalities seen in the upper abdomen. Musculoskeletal: No chest wall abnormality. No acute or significant osseous findings. IMPRESSION: 1. Several lymph nodes in the base of the neck on the left and the mediastinum are mildly larger in the interval. These nodes are nonspecific but the patient has a reported history of lymphoma. 2. New bilateral pleural effusions and pulmonary edema. 3. Stable lingular nodule. 4. Mildly more prominent gastrohepatic ligament lymph nodes. 5. The spleen is prominent but incompletely evaluated. This finding is grossly stable on limited views. Electronically Signed   By: Dorise Bullion III M.D   On: 06/09/2016 17:09        Scheduled Meds: . amLODipine  5 mg Oral Daily  . apixaban  5 mg Oral BID  . atorvastatin  10 mg Oral QPC supper  . famotidine  20 mg Oral Daily  . fenofibrate  160 mg Oral QPC supper  . fluticasone  1 spray Each Nare Daily  . levothyroxine  175 mcg Oral QAC breakfast  . mouth rinse  15 mL Mouth Rinse BID  . pantoprazole  40 mg Oral Daily  . propranolol  60 mg Oral BID  . sodium chloride flush  3 mL Intravenous Q12H  . tiotropium  18 mcg Inhalation Daily   Continuous Infusions:   LOS: 1 day    Time spent: 4min    [place] A, MD [facility] Pager [phone]  If 7PM-7AM, please contact night-coverage [email or web address] Password [facility] 06/11/2016, 9:52 AM

## 2016-06-12 DIAGNOSIS — J449 Chronic obstructive pulmonary disease, unspecified: Secondary | ICD-10-CM

## 2016-06-12 LAB — BASIC METABOLIC PANEL
Anion gap: 7 (ref 5–15)
BUN: 25 mg/dL — AB (ref 6–20)
CO2: 29 mmol/L (ref 22–32)
Calcium: 9.1 mg/dL (ref 8.9–10.3)
Chloride: 98 mmol/L — ABNORMAL LOW (ref 101–111)
Creatinine, Ser: 1.09 mg/dL — ABNORMAL HIGH (ref 0.44–1.00)
GFR calc Af Amer: 56 mL/min — ABNORMAL LOW (ref >60)
GFR, EST NON AFRICAN AMERICAN: 48 mL/min — AB (ref >60)
GLUCOSE: 100 mg/dL — AB (ref 65–99)
Potassium: 3.6 mmol/L (ref 3.5–5.1)
Sodium: 134 mmol/L — ABNORMAL LOW (ref 135–145)

## 2016-06-12 MED ORDER — FUROSEMIDE 20 MG PO TABS: 20.0000 mg | ORAL_TABLET | Freq: Every day | ORAL | 0 refills | Status: DC

## 2016-06-12 MED ORDER — CARVEDILOL 3.125 MG PO TABS: 3.1250 mg | ORAL_TABLET | Freq: Two times a day (BID) | ORAL | 0 refills | Status: DC

## 2016-06-12 NOTE — Progress Notes (Signed)
Pt has orders to be discharged. Discharge instructions given and pt has no additional questions at this time. Medication regimen reviewed and pt educated. Pt verbalized understanding and has no additional questions. Telemetry box removed. IV removed and site in good condition. Pt stable and waiting for transportation.   Zineb Glade RN 

## 2016-06-12 NOTE — Discharge Summary (Signed)
Physician Discharge Summary  SABENA WINNER YKD:[id number] DOB: [birth year] DOA: 06/09/2016  PCP: Gennette Pac, MD  Admit date: 06/09/2016 Discharge date: 06/12/2016  Admitted From: Home Disposition: Home  Recommendations for Outpatient Follow-up:  1. Follow up with PCP in 1-2 weeks 2. Please obtain BMP/CBC in one week  Home Health: NA Equipment/Devices:NA  Discharge Condition: Stable CODE STATUS: Full Code Diet recommendation: Diet Heart Room service appropriate? Yes; Fluid consistency: Thin; Fluid restriction: 1200 mL Fluid Diet - low sodium heart healthy  Brief/Interim Summary: Mozel Burdett Courtneyis a 76 y.o.femalewith medical history significant of A. 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 CXR and CT chest with CHF/Pleural effusions and slight worsening of adenopathy  Discharge Diagnoses:  Active Problems:   Chest pain   Hyperlipidemia   Chronic atrial fibrillation (HCC)   Hypertension   COPD (chronic obstructive pulmonary disease) (HCC)   Acute respiratory failure with hypoxemia (HCC)   Pulmonary nodules   Acute congestive heart failure (HCC)   CHF (congestive heart failure) (HCC)   ARF (acute renal failure) (HCC)   Acute respiratory failure w/ Hypoxemia:  -Due to acute CHF, 2-D echo showed LVEF of 55-60%. This is resolved, currently O2 sats mid 90s on room air. -Was on IV Lasix, developed acute renal failure, Lasix and ACEI held. -Ambulate today, check BMP in a.m.  Acute CHF with preserved EF -Patient presented with SOB, slightly elevated BNP at 362 and pulmonary edema per CT and CXR. -Started on aggressive diuresis with IV fluids. 2-D echo showed LVEF of 55-60% without mention of diastolic dysfunction. -Patient weight was down from 264 on admission to 247 on discharge. -Discharged on Lasix 20 mg daily, Coreg 3.215,  losartan and losartan. (Propranolol discontinued) -She is on amlodipine this was not discontinued, symmetric discontinued as outpatient.  Acute renal failure -Developed acute renal failure with creatinine of 1.13 this morning, BUN is elevated at 27 suggesting dehydration. -This is likely secondary to overdiuresis, hold Lasix. -Check BMP in a.m.  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 [name], current CT chest with slightly increase in size of nodes -advised FU with Dr.Mohamed when stable, she has an appointment in April  Afib: rate controlled. Chronic.  -Continue Eliquis, beta blockers -CHA2DS2-VASc is 5 secondary to HTN, CHF, female and age x2  Hypothyroid: - continue synthroid  HTN: - continue Coreg, Norvasc, Losartan  HLD: - lipitor, fenofibrate   Discharge Instructions  Discharge Instructions    Diet - low sodium heart healthy    Complete by:  As directed    Increase activity slowly    Complete by:  As directed      Allergies as of 06/12/2016      Reactions   Floxin [ofloxacin] Nausea Only, Other (See Comments)   Dizziness, Vertigo      Medication List    STOP taking these medications   omeprazole 20 MG tablet Commonly known as:  PRILOSEC OTC   propranolol 60 MG tablet Commonly known as:  INDERAL     TAKE these medications   albuterol 108 (90 Base) MCG/ACT inhaler Commonly known as:  PROVENTIL HFA;VENTOLIN HFA Inhale 2 puffs into the lungs every 4 (four) hours as needed for wheezing or shortness of breath.  amLODipine 5 MG tablet Commonly known as:  NORVASC Take 5 mg by mouth daily.   apixaban 5 MG Tabs tablet Commonly known as:  ELIQUIS Take 1 tablet (5 mg total) by mouth 2 (two) times daily.   atorvastatin 10 MG tablet Commonly  known as:  LIPITOR Take 10 mg by mouth daily after supper.   carvedilol 3.125 MG tablet Commonly known as:  COREG Take 1 tablet (3.125 mg total) by mouth 2 (two) times daily with a meal.   fenofibrate 160 MG tablet Take 160 mg by mouth daily after supper.   ferrous sulfate 325 (65 FE) MG EC tablet Take 325 mg by mouth daily after supper.   FISH OIL PO Take 1,960 mg by mouth 2 (two) times daily. 980 mg EPA-DHA per capsule   fluticasone 50 MCG/ACT nasal spray Commonly known as:  FLONASE Place 2 sprays into both nostrils daily.   furosemide 20 MG tablet Commonly known as:  LASIX Take 1 tablet (20 mg total) by mouth daily.   ketoconazole 2 % cream Commonly known as:  NIZORAL Reported on 05/02/2015   levothyroxine 175 MCG tablet Commonly known as:  SYNTHROID, LEVOTHROID TAKE 1 TABLET BY MOUTH DAILY BEFORE BREAKFAST.   losartan 100 MG tablet Commonly known as:  COZAAR Take 100 mg by mouth daily.   multivitamin with minerals Tabs tablet Take 1 tablet by mouth daily.   omeprazole 20 MG capsule Commonly known as:  PRILOSEC Take 1 capsule (20 mg total) by mouth 2 (two) times daily.   ranitidine 150 MG tablet Commonly known as:  ZANTAC Take 150 mg by mouth daily after lunch.   Tiotropium Bromide Monohydrate 2.5 MCG/ACT Aers Commonly known as:  SPIRIVA RESPIMAT Inhale 2 puffs into the lungs daily.      Follow-up Information    Gennette Pac, MD Follow up in 1 week(s).   Specialty:  Family Medicine Contact information: [place] [address] [phone]          Allergies  Allergen Reactions  . Floxin [Ofloxacin] Nausea Only and Other (See Comments)    Dizziness, Vertigo    Consultations:  None   Procedures (Echo, Carotid, EGD, Colonoscopy, ERCP)   Radiological studies: Dg Chest 2 View  Result Date: 06/09/2016 CLINICAL DATA:  Chest pain and shortness breath for 1 week. Lymphoma and breast carcinoma. EXAM: CHEST  2 VIEW  COMPARISON:  04/06/2015 FINDINGS: Stable mild cardiomegaly. New diffuse interstitial infiltrates, suspicious for pulmonary interstitial edema. No evidence of pulmonary consolidation or pleural effusion. IMPRESSION: New diffuse interstitial infiltrates, suspicious for pulmonary edema. Stable cardiomegaly Electronically Signed   By: Earle Gell M.D.   On: 06/09/2016 10:08   Ct Chest W Contrast  Result Date: 06/09/2016 CLINICAL DATA:  Pulmonary nodules, shortness of breath, and cough. History of lymphoma and breast cancer. EXAM: CT CHEST WITH CONTRAST TECHNIQUE: Multidetector CT imaging of the chest was performed during intravenous contrast administration. CONTRAST:  67mL ISOVUE-300 IOPAMIDOL (ISOVUE-300) INJECTION 61% COMPARISON:  CT of the chest July 16, 2015 and chest x-ray from earlier today FINDINGS: Cardiovascular: The thoracic aorta is non aneurysmal with no dissection. Cardiomegaly is identified. Coronary artery calcifications are noted. The central pulmonary arteries are unremarkable. Opacification limits evaluation for pulmonary emboli but no central emboli are identified. Mediastinum/Nodes: The patient is status post left mastectomy. The low left jugular/ supraclavicular adenopathy on the previous study measures 9 mm on both studies. An adjacent node on image 17 measures 11 mm in short axis  today versus 9 mm previously. The subcarinal node measures 2.3 cm today versus 1.8 cm previously. The left infrahilar node on image 77 measures 11 mm today versus 10 mm previously. The right paraesophageal adenopathy measures 2.7 cm on both studies. There is a tiny hiatal hernia. The esophagus is otherwise normal. A right pretracheal node on image 56 is more prominent in the interval as is another on image 62. A tiny left pleural effusion and a small moderate right pleural effusion are identified. No pericardial effusion. Lungs/Pleura: The central airways are within normal limits. Bilateral ground-glass opacities are  more prominent the interval. I suspect mild edema. No pneumothorax. The 6 mm nodule in the lingula seen on the previous study measures 5 mm on image 74 today. Right middle lobe and lingular atelectasis. Bilateral pleural effusions, right greater than left, with associated compressive atelectasis. No infiltrate to suggest pneumonia. Upper Abdomen: Shotty gastrohepatic nodes on image 134 slightly more prominent the interval. Spleen is prominent but incompletely evaluated. No other abnormalities seen in the upper abdomen. Musculoskeletal: No chest wall abnormality. No acute or significant osseous findings. IMPRESSION: 1. Several lymph nodes in the base of the neck on the left and the mediastinum are mildly larger in the interval. These nodes are nonspecific but the patient has a reported history of lymphoma. 2. New bilateral pleural effusions and pulmonary edema. 3. Stable lingular nodule. 4. Mildly more prominent gastrohepatic ligament lymph nodes. 5. The spleen is prominent but incompletely evaluated. This finding is grossly stable on limited views. Electronically Signed   By: Dorise Bullion III M.D   On: 06/09/2016 17:09     Subjective:  Discharge Exam: Vitals:   06/11/16 2015 06/12/16 0522 06/12/16 0902 06/12/16 1029  BP: (!) 123/55 (!) 125/58 (!) 111/55   Pulse: 82 72 73 80  Resp: 18 18  18   Temp: 97.9 F (36.6 C) 97.7 F (36.5 C) 97.6 F (36.4 C)   TempSrc: Oral Oral Oral   SpO2: 93% 93%  94%  Weight:  112.4 kg (247 lb 11.2 oz)    Height:       General: Pt is alert, awake, not in acute distress Cardiovascular: RRR, S1/S2 +, no rubs, no gallops Respiratory: CTA bilaterally, no wheezing, no rhonchi Abdominal: Soft, NT, ND, bowel sounds + Extremities: no edema, no cyanosis   The results of significant diagnostics from this hospitalization (including imaging, microbiology, ancillary and laboratory) are listed below for reference.    Microbiology: No results found for this or any  previous visit (from the past 240 hour(s)).   Labs: BNP (last 3 results)  Recent Labs  06/09/16 0936  BNP 025.8*   Basic Metabolic Panel:  Recent Labs Lab 06/09/16 0928 06/10/16 0042 06/11/16 0529 06/12/16 0430  NA 135 134* 136 134*  K 4.6 3.5 3.6 3.6  CL 100* 93* 94* 98*  CO2 27 31 30 29   GLUCOSE 121* 96 101* 100*  BUN 16 19 27* 25*  CREATININE 0.78 0.87 1.13* 1.09*  CALCIUM 9.7 9.4 9.3 9.1   Liver Function Tests: No results for input(s): AST, ALT, ALKPHOS, BILITOT, PROT, ALBUMIN in the last 168 hours. No results for input(s): LIPASE, AMYLASE in the last 168 hours. No results for input(s): AMMONIA in the last 168 hours. CBC:  Recent Labs Lab 06/09/16 0928 06/10/16 0042  WBC 5.3 4.4  HGB 11.7* 10.5*  HCT 36.9 33.7*  MCV 88.9 88.0  PLT 276 297   Cardiac Enzymes:  Recent Labs Lab 06/09/16 1810  06/09/16 2100 06/10/16 0036  TROPONINI <0.03 <0.03 <0.03   BNP: Invalid input(s): POCBNP CBG: No results for input(s): GLUCAP in the last 168 hours. D-Dimer No results for input(s): DDIMER in the last 72 hours. Hgb A1c No results for input(s): HGBA1C in the last 72 hours. Lipid Profile No results for input(s): CHOL, HDL, LDLCALC, TRIG, CHOLHDL, LDLDIRECT in the last 72 hours. Thyroid function studies No results for input(s): TSH, T4TOTAL, T3FREE, THYROIDAB in the last 72 hours.  Invalid input(s): FREET3 Anemia work up No results for input(s): VITAMINB12, FOLATE, FERRITIN, TIBC, IRON, RETICCTPCT in the last 72 hours. Urinalysis    Component Value Date/Time   COLORURINE YELLOW 10/04/2014 1457   APPEARANCEUR CLOUDY (A) 10/04/2014 1457   LABSPEC 1.025 10/04/2014 1457   PHURINE 6.5 10/04/2014 1457   GLUCOSEU NEGATIVE 10/04/2014 1457   HGBUR NEGATIVE 10/04/2014 1457   [place] 10/04/2014 1457   [place] 10/04/2014 1457   PROTEINUR NEGATIVE 10/04/2014 1457   UROBILINOGEN 1.0 10/04/2014 1457   NITRITE NEGATIVE 10/04/2014 1457    LEUKOCYTESUR MODERATE (A) 10/04/2014 1457   Sepsis Labs Invalid input(s): PROCALCITONIN,  WBC,  LACTICIDVEN Microbiology No results found for this or any previous visit (from the past 240 hour(s)).   Time coordinating discharge: Over 30 minutes  SIGNED:   Birdie Hopes, MD  [facility] 06/12/2016, 10:42 AM Pager   If 7PM-7AM, please contact night-coverage [email or web address] Password [email or web address]

## 2016-06-19 DIAGNOSIS — I10 Essential (primary) hypertension: Secondary | ICD-10-CM | POA: Diagnosis not present

## 2016-06-19 DIAGNOSIS — I509 Heart failure, unspecified: Secondary | ICD-10-CM | POA: Diagnosis not present

## 2016-06-20 ENCOUNTER — Telehealth: Payer: Self-pay | Admitting: Neurology

## 2016-06-20 NOTE — Telephone Encounter (Signed)
Pt states she has been taken off propranolol hc1 and needs to be put on something not a beta blocker due to congestive heart failure.  Needing something for essential tremors.

## 2016-06-20 NOTE — Telephone Encounter (Signed)
I spoke to pt.  She was in hospital for her CHF.  Placed on coreg and lasix.  Taken off propranolol.  (her tremors have returned).  Pt is asking for recommendations for something else for her tremors, and will see Dr. Kandis Mannan PA on 06-30-16.  Hoping he will prescribe.  If not will need appt with [place].

## 2016-06-23 NOTE — Telephone Encounter (Signed)
I spoke to Kristin Hood and let her know that topamax was recommendation.  She then mentioned that FYI that when taking propranolol and synthroid that her tremors were at the lowest she ever had them.  Wanted Dr. Leonie Man to know as Kristin Hood.

## 2016-06-23 NOTE — Telephone Encounter (Signed)
Yes she was changed to coreg.

## 2016-06-23 NOTE — Telephone Encounter (Signed)
I assume the propranolol was stopped as it had worsened heart failure hence prescribing Topamax instead

## 2016-06-23 NOTE — Telephone Encounter (Signed)
Can try Topamax 25 mg twice daily x 1 week then 50 mg twice daily if tolerated

## 2016-06-30 ENCOUNTER — Ambulatory Visit (INDEPENDENT_AMBULATORY_CARE_PROVIDER_SITE_OTHER): Payer: PPO | Admitting: Cardiology

## 2016-06-30 ENCOUNTER — Encounter: Payer: Self-pay | Admitting: Cardiology

## 2016-06-30 VITALS — BP 102/62 | HR 93 | Ht 66.0 in | Wt 251.8 lb

## 2016-06-30 DIAGNOSIS — I272 Pulmonary hypertension, unspecified: Secondary | ICD-10-CM | POA: Diagnosis not present

## 2016-06-30 DIAGNOSIS — I4819 Other persistent atrial fibrillation: Secondary | ICD-10-CM

## 2016-06-30 DIAGNOSIS — I481 Persistent atrial fibrillation: Secondary | ICD-10-CM

## 2016-06-30 DIAGNOSIS — I5032 Chronic diastolic (congestive) heart failure: Secondary | ICD-10-CM

## 2016-06-30 DIAGNOSIS — Z7901 Long term (current) use of anticoagulants: Secondary | ICD-10-CM

## 2016-06-30 MED ORDER — CARVEDILOL 3.125 MG PO TABS: 3.1250 mg | ORAL_TABLET | Freq: Two times a day (BID) | ORAL | 3 refills | Status: DC

## 2016-06-30 MED ORDER — FUROSEMIDE 20 MG PO TABS: 20.0000 mg | ORAL_TABLET | Freq: Every day | ORAL | 3 refills | Status: DC

## 2016-06-30 NOTE — Progress Notes (Signed)
Cardiology Office Note   Date:  06/30/2016   ID:  Kristin Hood, DOB 02-Jun-1940, MRN [id number]  PCP:  Kristin Pac, MD  Cardiologist:    [name]  Chief Complaint  Patient presents with  . Atrial Fibrillation    recent respiratory failure.       History of Present Illness: Kristin Hood is a 76 y.o. female who presents for persistent a fib/permanent a fib.  Rate controlled on inderal.  She has order to take extra inderal for rapid HR.    She has chronic diastolic HF, COPD, CVA with TPA,    catheterization on 08/14/11 which showed no evidence of coronary artery disease. LVEDP was 14 mm mercury.   Recent admit and discharge in March for acute respiratory failure with hypoxemia and acute renal failure. Acute daistolic HF, BNP at 790, pulmonary edema and normal EF on echo. -  She was diuresed.  She did have chest pain sharp and radiated to her back, but troponins neg  Her a fib remained rate controlled and she is on Eliquis due to CHA2DS2-VAScis 5.  CT of chest with mildly increased lymph nodes.  To follow with Dr. [name] though pt states he called them and said this was not unexpected.   Today her wt is stable and she is weighing daily, Dr. Rex Kras today her if her wt is up 2 lbs in a day she will take an extra 20 mg of Lasix, this certainly is reasonable and she will notify us if she needs to do frequently.  She has no SOB or chest pain.  Mild edema of lower ext but only her usual.  Her husband in in [facility] with GI bleed and NSTEMI.  [name] saw him today.    Pt doe complain of increase in her essential tremor off propranolol and she is now on coreg.  She did contact Dr. Leonie Man and he recommended topamax.  Will review with [name].     Past Medical History:  Diagnosis Date  . Anginal pain ([place])   . Arthritis   . Atrial fibrillation ([place]) 07/2012   Eliquis, rate controlled  . Breast cancer ([place]) 2001   left mastectomy  . Coarse tremors    , Essential  .  Complication of anesthesia    SMALLER TUBE FOR INTUBATION AS GAGS ON TUBE  . COPD (chronic obstructive pulmonary disease) ([place])   . CVA (cerebral vascular accident) ([place]) 2013  . Dysrhythmia 08/20/2012   Atrial Fibrillation  . Family history of adverse reaction to anesthesia    mother experienced pain with intubation   . GERD (gastroesophageal reflux disease)    history of   . Hard of hearing   . Heart murmur   . History of chemotherapy 11/13/1998 to 2010   Adriamycin/Docotaxol, Dexorubicin/Taxotere, Femara, Tamoxifen  . History of hiatal hernia    history of   . Hx of adenomatous colonic polyps 2006   , Benign  . Hypercholesteremia   . Hypertension    , With mild concentric left ventricular hypertrophy  . Mixed dyslipidemia   . Shingles   . Shortness of breath dyspnea    on exertion   . Spider veins   . Stroke ([place]) 11/17/2011   left side brain-speech-TPA  . Thyroid nodule   . Tremor, essential   . Valvular sclerosis 09/23/2003   without stenosis  . Varicose veins     Past Surgical History:  Procedure Laterality Date  . APPENDECTOMY  07/10/1957  . BIOPSY THYROID  12/01/2013   ultrasound  . BREAST SURGERY    . BUNIONECTOMY  11/29/1988   bilateral with hammer toes  . CARDIAC CATHETERIZATION  07/2010   , Without significant CAD  . CARDIAC CATHETERIZATION  07/2011   [name]  . CESAREAN SECTION    . CESAREAN SECTION  05/1968, 07/1965  . EYE SURGERY  01/31/2013   eye lid; cataract surgery bilat   . JOINT REPLACEMENT  05/2001   left knee  . JOINT REPLACEMENT  11/2001   right knee  . JOINT REPLACEMENT  01/2008   redo right knee  . KNEE SURGERY  2009   ,Total knee replacement  . LEFT HEART CATHETERIZATION WITH CORONARY ANGIOGRAM N/A 08/14/2011   Procedure: LEFT HEART CATHETERIZATION WITH CORONARY ANGIOGRAM;  Surgeon: [name], MD;  Location: [facility] CATH LAB;  Service: Cardiovascular;  Laterality: N/A;  . Left Rotator Cuff    . MASTECTOMY  10/19/1998   Radical,  left -with lymph nodes (8 total)  . Reverse bunionectomy  08/29/1993   removal of Tibial Sesamoid-left  . TEE WITHOUT CARDIOVERSION  11/19/2011   Procedure: TRANSESOPHAGEAL ECHOCARDIOGRAM (TEE);  Surgeon: [name], MD;  Location: [facility] ENDOSCOPY;  Service: Cardiovascular;  Laterality: N/A;  . THYROIDECTOMY N/A 03/02/2015   Procedure: TOTAL THYROIDECTOMY;  Surgeon: Armandina Gemma, MD;  Location: [facility] ORS;  Service: General;  Laterality: N/A;  . TONSILLECTOMY    . TOTAL HIP ARTHROPLASTY Left 10/11/2014   Procedure: LEFT TOTAL HIP ARTHROPLASTY ANTERIOR APPROACH;  Surgeon: [name], MD;  Location: [facility] ORS;  Service: Orthopedics;  Laterality: Left;     Current Outpatient Prescriptions  Medication Sig Dispense Refill  . albuterol (PROVENTIL HFA;VENTOLIN HFA) 108 (90 Base) MCG/ACT inhaler Inhale 2 puffs into the lungs every 4 (four) hours as needed for wheezing or shortness of breath. 1 Inhaler 5  . amLODipine (NORVASC) 5 MG tablet Take 5 mg by mouth daily.    Marland Kitchen amoxicillin (AMOXIL) 500 MG capsule Take 500 mg by mouth as directed. Before dental appointments    . apixaban (ELIQUIS) 5 MG TABS tablet Take 1 tablet (5 mg total) by mouth 2 (two) times daily. 180 tablet 3  . atorvastatin (LIPITOR) 10 MG tablet Take 10 mg by mouth daily after supper.     . carvedilol (COREG) 3.125 MG tablet Take 1 tablet (3.125 mg total) by mouth 2 (two) times daily with a meal. 60 tablet 0  . fenofibrate 160 MG tablet Take 160 mg by mouth daily after supper.     . ferrous sulfate 325 (65 FE) MG EC tablet Take 325 mg by mouth daily after supper.     . fluticasone (FLONASE) 50 MCG/ACT nasal spray Place 2 sprays into both nostrils daily. 16 g 2  . furosemide (LASIX) 20 MG tablet Take 1 tablet (20 mg total) by mouth daily. 30 tablet 0  . ketoconazole (NIZORAL) 2 % cream Reported on 05/02/2015  0  . levothyroxine (SYNTHROID, LEVOTHROID) 175 MCG tablet TAKE 1 TABLET BY MOUTH DAILY BEFORE BREAKFAST. 30 tablet 2  . losartan (COZAAR)  100 MG tablet Take 100 mg by mouth daily.    . Multiple Vitamin (MULTIVITAMIN WITH MINERALS) TABS tablet Take 1 tablet by mouth daily.    Marland Kitchen omeprazole (PRILOSEC) 20 MG capsule Take 1 capsule (20 mg total) by mouth 2 (two) times daily. 60 capsule 6  . Tiotropium Bromide Monohydrate (SPIRIVA RESPIMAT) 2.5 MCG/ACT AERS Inhale 2 puffs into the lungs daily. 3 Inhaler  3   No current facility-administered medications for this visit.     Allergies:   Floxin [ofloxacin]    Social History:  The patient  reports that she quit smoking about 24 years ago. Her smoking use included Cigarettes. She has a 60.00 pack-year smoking history. She has never used smokeless tobacco. She reports that she drinks about 0.6 oz of alcohol per week . She reports that she does not use drugs.   Family History:  The patient's family history includes Heart disease in her father and mother; Heart failure in her father.    ROS:  General:no colds or fevers, no weight changes--new dry wt 250 lbs Skin:no rashes or ulcers HEENT:no blurred vision, no congestion CV:see HPI PUL:see HPI GI:no diarrhea constipation or melena, no indigestion GU:no hematuria, no dysuria MS:no joint pain, no claudication Neuro:no syncope, no lightheadedness Endo:no diabetes, no thyroid disease  Wt Readings from Last 3 Encounters:  06/30/16 251 lb 12.8 oz (114.2 kg)  06/12/16 247 lb 11.2 oz (112.4 kg)  06/05/16 264 lb 3.2 oz (119.8 kg)     PHYSICAL EXAM: VS:  BP 102/62   Pulse 93   Ht 5\' 6"  (1.676 m)   Wt 251 lb 12.8 oz (114.2 kg)   SpO2 97%   BMI 40.64 kg/m  , BMI Body mass index is 40.64 kg/m. General:Pleasant affect, NAD Skin:Warm and dry, brisk capillary refill HEENT:normocephalic, sclera clear, mucus membranes moist Neck:supple, no JVD, no bruits  Heart:irreg irreg but well controlled without murmur, gallup, rub or click Lungs:clear without rales, rhonchi, or wheezes EXB:MWUXL, soft, non tender, + BS, do not palpate liver spleen  or masses Ext:no lower ext edema to trace, 2+ pedal pulses, 2+ radial pulses Neuro:alert and oriented X 3, MAE, follows commands, + facial symmetry    EKG:  EKG is NOT ordered today. I personally reviewed ekgs done in the hospital. No acute changes, A fib continues     Recent Labs: 07/16/2015: ALT 14 03/04/2016: TSH 4.54 06/09/2016: B Natriuretic Peptide 362.8 06/10/2016: Hemoglobin 10.5; Platelets 297 06/12/2016: BUN 25; Creatinine, Ser 1.09; Potassium 3.6; Sodium 134    Lipid Panel    Component Value Date/Time   CHOL 148 11/18/2011 0420   TRIG 101 11/18/2011 0420   HDL 34 (L) 11/18/2011 0420   CHOLHDL 4.4 11/18/2011 0420   VLDL 20 11/18/2011 0420   LDLCALC 94 11/18/2011 0420       Other studies Reviewed: Additional studies/ records that were reviewed today include:   Echo:. Study Conclusions  - Left ventricle: The cavity size was normal. Wall thickness was   normal. Systolic function was normal. The estimated ejection   fraction was in the range of 55% to 60%. Wall motion was normal;   there were no regional wall motion abnormalities. - Aortic valve: There was mild stenosis. There was trivial   regurgitation. - Mitral valve: Moderately calcified annulus. There was mild   regurgitation. - Left atrium: The atrium was severely dilated. - Right atrium: The atrium was moderately dilated. - Pulmonary arteries: Systolic pressure was mildly increased. PA   peak pressure: 46 mm Hg (S).  Impressions:  - Normal LV systolic function; biatrial enlargement; calcified   aortic valve with mild AS with mean gradient 10 mmHg and trace   AI; mild MR; mild TR with mildly elevated pulmonary pressure.  ASSESSMENT AND PLAN:  1.  Permanent a fib, on Eliquis without bleeding.  Rate controlled, her propranolol was changed to coreg.  Will discuss with  [name] - her essential tremor has increased stopping propranolol-. Will have her follow up in May with [name].  She will call  in the meantime if increased   2. CAD mild neg troponins on recent hospitalization. Cath in 2013 was without angiographically significant CAD.  3. Moderate PH with PA pressure 46 mHg.   4. Recent acute respiratory failure due to diastolic HF.  Now stable, continue lasix as is, Dr. Rex Kras, PCP check labs on 06/19/16 with Cr  0.94, Na 137 and k+ 4.7,   5. HTN with well controlled.BP to soft.    Current medicines are reviewed with the patient today.  The patient Has no concerns regarding medicines.  The following changes have been made:  See above Labs/ tests ordered today include:see above  Disposition:   FU:  see above  Signed, Cecilie Kicks, NP  06/30/2016 1:33 PM    [facility] [place], [place], [place], [place] Phone: [phone]; Fax: [phone]

## 2016-06-30 NOTE — Patient Instructions (Addendum)
Medication Instructions:  Your physician recommends that you continue on your current medications as directed. Please refer to the Current Medication list given to you today.  REFILLS WERE SENT IN FOR COREG AND LASIX  Labwork: NONE ORDERED  Testing/Procedures: NONE ORDERED   Follow-Up: [name] 07/30/16 @ 10:45   Any Other Special Instructions Will Be Listed Below (If Applicable).     If you need a refill on your cardiac medications before your next appointment, please call your pharmacy.

## 2016-07-01 ENCOUNTER — Telehealth: Payer: Self-pay | Admitting: *Deleted

## 2016-07-01 MED ORDER — PROPRANOLOL HCL ER 60 MG PO CP24: ORAL_CAPSULE | ORAL | 1 refills | Status: DC

## 2016-07-01 NOTE — Telephone Encounter (Signed)
Per Cecilie Kicks, NP, called the pt to let her know that per [name], that it was ok for her to d/c the Coreg and go back to Inderal 60 mg 2 tabs in a.m, 1 tab in pm, and 1 tab at night.  Pt verbalized appreciation and a rx was sent to pts pharmacy.

## 2016-07-04 ENCOUNTER — Other Ambulatory Visit: Payer: Self-pay | Admitting: Internal Medicine

## 2016-07-08 DIAGNOSIS — M1811 Unilateral primary osteoarthritis of first carpometacarpal joint, right hand: Secondary | ICD-10-CM | POA: Diagnosis not present

## 2016-07-15 ENCOUNTER — Other Ambulatory Visit (HOSPITAL_BASED_OUTPATIENT_CLINIC_OR_DEPARTMENT_OTHER): Payer: PPO

## 2016-07-15 ENCOUNTER — Ambulatory Visit (HOSPITAL_COMMUNITY)
Admission: RE | Admit: 2016-07-15 | Discharge: 2016-07-15 | Disposition: A | Discharge status: Home / Self Care | Payer: PPO | Source: Ambulatory Visit | Attending: Internal Medicine | Admitting: Internal Medicine

## 2016-07-15 DIAGNOSIS — R591 Generalized enlarged lymph nodes: Secondary | ICD-10-CM | POA: Diagnosis not present

## 2016-07-15 DIAGNOSIS — I700 Atherosclerosis of aorta: Secondary | ICD-10-CM | POA: Diagnosis not present

## 2016-07-15 DIAGNOSIS — R162 Hepatomegaly with splenomegaly, not elsewhere classified: Secondary | ICD-10-CM | POA: Insufficient documentation

## 2016-07-15 DIAGNOSIS — R911 Solitary pulmonary nodule: Secondary | ICD-10-CM | POA: Insufficient documentation

## 2016-07-15 DIAGNOSIS — C50912 Malignant neoplasm of unspecified site of left female breast: Secondary | ICD-10-CM | POA: Diagnosis not present

## 2016-07-15 LAB — CBC WITH DIFFERENTIAL/PLATELET
BASO%: 0.6 % (ref 0.0–2.0)
Basophils Absolute: 0 10*3/uL (ref 0.0–0.1)
EOS%: 1.5 % (ref 0.0–7.0)
Eosinophils Absolute: 0.1 10*3/uL (ref 0.0–0.5)
HCT: 34.3 % — ABNORMAL LOW (ref 34.8–46.6)
HEMOGLOBIN: 11.5 g/dL — AB (ref 11.6–15.9)
LYMPH%: 8.7 % — AB (ref 14.0–49.7)
MCH: 28.9 pg (ref 25.1–34.0)
MCHC: 33.4 g/dL (ref 31.5–36.0)
MCV: 86.5 fL (ref 79.5–101.0)
MONO#: 0.4 10*3/uL (ref 0.1–0.9)
MONO%: 12.1 % (ref 0.0–14.0)
NEUT#: 2.6 10*3/uL (ref 1.5–6.5)
NEUT%: 77.1 % — ABNORMAL HIGH (ref 38.4–76.8)
Platelets: 230 10*3/uL (ref 145–400)
RBC: 3.96 10*6/uL (ref 3.70–5.45)
RDW: 15.4 % — AB (ref 11.2–14.5)
WBC: 3.4 10*3/uL — AB (ref 3.9–10.3)
lymph#: 0.3 10*3/uL — ABNORMAL LOW (ref 0.9–3.3)

## 2016-07-15 LAB — COMPREHENSIVE METABOLIC PANEL
ALBUMIN: 3.6 g/dL (ref 3.5–5.0)
ALK PHOS: 29 U/L — AB (ref 40–150)
ALT: 13 U/L (ref 0–55)
AST: 19 U/L (ref 5–34)
Anion Gap: 10 mEq/L (ref 3–11)
BUN: 20.7 mg/dL (ref 7.0–26.0)
CHLORIDE: 106 meq/L (ref 98–109)
CO2: 25 mEq/L (ref 22–29)
Calcium: 9.6 mg/dL (ref 8.4–10.4)
Creatinine: 1.1 mg/dL (ref 0.6–1.1)
EGFR: 50 mL/min/{1.73_m2} — AB (ref >90)
GLUCOSE: 115 mg/dL (ref 70–140)
Potassium: 4 mEq/L (ref 3.5–5.1)
SODIUM: 141 meq/L (ref 136–145)
Total Bilirubin: 1.13 mg/dL (ref 0.20–1.20)
Total Protein: 7.4 g/dL (ref 6.4–8.3)

## 2016-07-15 MED ORDER — IOPAMIDOL (ISOVUE-300) INJECTION 61%
INTRAVENOUS | Status: AC
  Administered 2016-07-15: 100 mL
  Filled 2016-07-15: qty 100

## 2016-07-21 ENCOUNTER — Ambulatory Visit (HOSPITAL_BASED_OUTPATIENT_CLINIC_OR_DEPARTMENT_OTHER): Payer: PPO | Admitting: Internal Medicine

## 2016-07-21 ENCOUNTER — Telehealth: Payer: Self-pay | Admitting: Internal Medicine

## 2016-07-21 ENCOUNTER — Encounter: Payer: Self-pay | Admitting: Internal Medicine

## 2016-07-21 ENCOUNTER — Telehealth: Payer: Self-pay | Admitting: Physician Assistant

## 2016-07-21 VITALS — BP 149/79 | HR 79 | Temp 97.5°F | Resp 18 | Ht 66.0 in | Wt 255.5 lb

## 2016-07-21 DIAGNOSIS — D72819 Decreased white blood cell count, unspecified: Secondary | ICD-10-CM

## 2016-07-21 DIAGNOSIS — R161 Splenomegaly, not elsewhere classified: Secondary | ICD-10-CM | POA: Diagnosis not present

## 2016-07-21 DIAGNOSIS — D649 Anemia, unspecified: Secondary | ICD-10-CM

## 2016-07-21 DIAGNOSIS — R591 Generalized enlarged lymph nodes: Secondary | ICD-10-CM

## 2016-07-21 DIAGNOSIS — R590 Localized enlarged lymph nodes: Secondary | ICD-10-CM

## 2016-07-21 NOTE — Telephone Encounter (Signed)
Patient contacted me (known to my family as neighbors, followed in our practice) requesting I speak to the PA who works with her orthopedist. Kristin Hood recently lost her husband and has been dealing with increased pain from her sciatica. She's not had any asymmetric swelling or swelling. She contacted ortho who suggested prednisone but wanted to clear it with cardiology first. She is on Eliquis and also has h/o CHF. I spoke with Delynn Flavin, [place] with Dr. Archie Endo office who stated given her history she'd like to avoid NSAIDS but wanted to find out if very low dose prednisone was reasonable, I.e. 5mg  pack for 6 days.  I told Delynn Flavin I thought this was a reasonable approach. I advised she take a PPI while on prednisone given her Eliquis use. I then spoke again with [name] who weighs herself daily and has not reported any significant deviation from baseline. We discussed importance of calling for any weight gain of 3lbs or greater, and also discussed PPI. She verbalized understanding and gratitude. The low dose prednisone will be provided by ortho. Dayna Dunn PA-C

## 2016-07-21 NOTE — Progress Notes (Signed)
[place] [phone]   Fax:([phone]) [place], [place] [address]  DIAGNOSIS: Persistent lymphadenopathy in the mediastinum and retroperitoneum as well as splenomegaly suspicious for lymphoproliferative disorder but has been stable for the last 36 months. 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  PRIOR THERAPY: None.  CURRENT THERAPY: Observation.  INTERVAL HISTORY: Kristin Hood 76 y.o. female returns to the clinic today for annual follow-up visit accompanied by her daughter. The patient is feeling fine today with no specific complaints except for mild fatigue. She is under a lot of stress and she lost her husband last week. She denied having any recent weight loss or night sweats. She has no fever or chills. She denied having any nausea, vomiting, diarrhea or constipation. She has no chest pain but has shortness breath with exertion with no cough or hemoptysis. She was recently diagnosed with congestive heart failure and she is currently on Lasix. The patient had repeat CT scan of the chest, abdomen and pelvis performed recently and she is here for evaluation and discussion of her scan results.  MEDICAL HISTORY: Past Medical History:  Diagnosis Date  . Anginal pain ([place])   . Arthritis   . Atrial fibrillation ([place]) 07/2012   Eliquis, rate controlled  . Breast cancer ([place]) 2001   left mastectomy  . Coarse tremors    , Essential  . Complication of anesthesia    SMALLER TUBE FOR INTUBATION AS GAGS ON TUBE  . COPD (chronic obstructive pulmonary disease) ([place])   . CVA (cerebral vascular accident) ([place]) 2013  . Dysrhythmia 08/20/2012   Atrial Fibrillation  . Family history of adverse reaction to anesthesia    mother experienced pain with intubation   . GERD (gastroesophageal reflux disease)    history of   . Hard of hearing   . Heart murmur   . History of chemotherapy 11/13/1998 to 2010   Adriamycin/Docotaxol, Dexorubicin/Taxotere, Femara, Tamoxifen  . History of hiatal hernia    history of   . Hx of adenomatous colonic polyps 2006   , Benign  . Hypercholesteremia   . Hypertension    , With mild concentric left ventricular hypertrophy  . Mixed dyslipidemia   . Shingles   . Shortness of breath dyspnea    on exertion   . Spider veins   . Stroke ([place]) 11/17/2011   left side brain-speech-TPA  . Thyroid nodule   . Tremor, essential   . Valvular sclerosis 09/23/2003   without stenosis  . Varicose veins     ALLERGIES:  is allergic to floxin [ofloxacin].  MEDICATIONS:  Current Outpatient Prescriptions  Medication Sig Dispense Refill  . albuterol (PROVENTIL HFA;VENTOLIN HFA) 108 (90 Base) MCG/ACT inhaler Inhale 2 puffs into the lungs every 4 (four) hours as needed for wheezing or shortness of breath. 1 Inhaler 5  . amLODipine (NORVASC) 5 MG tablet Take 5 mg by mouth daily.    Marland Kitchen apixaban (ELIQUIS) 5 MG TABS tablet Take 1 tablet (5 mg total) by mouth 2 (two) times daily. 180 tablet 3  . atorvastatin (LIPITOR) 10 MG tablet Take 10 mg by mouth daily after supper.     . fenofibrate 160 MG tablet Take 160 mg by mouth daily after supper.     . ferrous sulfate 325 (65 FE) MG EC tablet Take 325 mg by mouth daily  after supper.     . fluticasone (FLONASE) 50 MCG/ACT nasal spray Place 2 sprays into both nostrils daily. 16 g 2  . furosemide (LASIX) 20 MG tablet Take 1 tablet (20 mg total) by mouth daily. 90 tablet 3  . ketoconazole (NIZORAL) 2 % cream Reported on 05/02/2015  0  . levothyroxine (SYNTHROID, LEVOTHROID) 175 MCG tablet TAKE 1 TABLET BY MOUTH DAILY BEFORE BREAKFAST. 30 tablet 2  . losartan (COZAAR) 100 MG tablet Take 100 mg by mouth daily.    . Multiple Vitamin (MULTIVITAMIN WITH MINERALS) TABS tablet Take 1 tablet by mouth daily.    Marland Kitchen omeprazole (PRILOSEC) 20 MG capsule  Take 1 capsule (20 mg total) by mouth 2 (two) times daily. 60 capsule 6  . propranolol ER (INDERAL LA) 60 MG 24 hr capsule Take 2 tablets by mouth in the a.m, 1 tablet by mouth in the afternoon, and 1 tablet by mouth at night. 360 capsule 1  . Tiotropium Bromide Monohydrate (SPIRIVA RESPIMAT) 2.5 MCG/ACT AERS Inhale 2 puffs into the lungs daily. 3 Inhaler 3  . amoxicillin (AMOXIL) 500 MG capsule Take 500 mg by mouth as directed. Before dental appointments     No current facility-administered medications for this visit.     SURGICAL HISTORY:  Past Surgical History:  Procedure Laterality Date  . APPENDECTOMY  07/10/1957  . BIOPSY THYROID  12/01/2013   ultrasound  . BREAST SURGERY    . BUNIONECTOMY  11/29/1988   bilateral with hammer toes  . CARDIAC CATHETERIZATION  07/2010   , Without significant CAD  . CARDIAC CATHETERIZATION  07/2011   [name]  . CESAREAN SECTION    . CESAREAN SECTION  05/1968, 07/1965  . EYE SURGERY  01/31/2013   eye lid; cataract surgery bilat   . JOINT REPLACEMENT  05/2001   left knee  . JOINT REPLACEMENT  11/2001   right knee  . JOINT REPLACEMENT  01/2008   redo right knee  . KNEE SURGERY  2009   ,Total knee replacement  . LEFT HEART CATHETERIZATION WITH CORONARY ANGIOGRAM N/A 08/14/2011   Procedure: LEFT HEART CATHETERIZATION WITH CORONARY ANGIOGRAM;  Surgeon: [name], MD;  Location: [facility] CATH LAB;  Service: Cardiovascular;  Laterality: N/A;  . Left Rotator Cuff    . MASTECTOMY  10/19/1998   Radical, left -with lymph nodes (8 total)  . Reverse bunionectomy  08/29/1993   removal of Tibial Sesamoid-left  . TEE WITHOUT CARDIOVERSION  11/19/2011   Procedure: TRANSESOPHAGEAL ECHOCARDIOGRAM (TEE);  Surgeon: [name], MD;  Location: [facility] ENDOSCOPY;  Service: Cardiovascular;  Laterality: N/A;  . THYROIDECTOMY N/A 03/02/2015   Procedure: TOTAL THYROIDECTOMY;  Surgeon: Armandina Gemma, MD;  Location: [facility] ORS;  Service: General;  Laterality: N/A;  . TONSILLECTOMY     . TOTAL HIP ARTHROPLASTY Left 10/11/2014   Procedure: LEFT TOTAL HIP ARTHROPLASTY ANTERIOR APPROACH;  Surgeon: [name], MD;  Location: [facility] ORS;  Service: Orthopedics;  Laterality: Left;    REVIEW OF SYSTEMS:  A comprehensive review of systems was negative except for: Constitutional: positive for fatigue Respiratory: positive for dyspnea on exertion   PHYSICAL EXAMINATION: General appearance: alert, cooperative, fatigued and no distress Head: Normocephalic, without obvious abnormality, atraumatic Neck: no adenopathy, no JVD, supple, symmetrical, trachea midline and thyroid not enlarged, symmetric, no tenderness/mass/nodules Lymph nodes: Cervical, supraclavicular, and axillary nodes normal. Resp: clear to auscultation bilaterally Back: symmetric, no curvature. ROM normal. No CVA tenderness. Cardio: regular rate and rhythm, S1, S2 normal, no murmur, click, rub  or gallop GI: soft, non-tender; bowel sounds normal; no masses,  no organomegaly Extremities: extremities normal, atraumatic, no cyanosis or edema  ECOG PERFORMANCE STATUS: 1 - Symptomatic but completely ambulatory  Blood pressure (!) 149/79, pulse 79, temperature 97.5 F (36.4 C), temperature source Oral, resp. rate 18, height 5\' 6"  (1.676 m), weight 255 lb 8 oz (115.9 kg), SpO2 98 %.  LABORATORY DATA: Lab Results  Component Value Date   WBC 3.4 (L) 07/15/2016   HGB 11.5 (L) 07/15/2016   HCT 34.3 (L) 07/15/2016   MCV 86.5 07/15/2016   PLT 230 07/15/2016      Chemistry      Component Value Date/Time   NA 141 07/15/2016 0906   K 4.0 07/15/2016 0906   CL 98 (L) 06/12/2016 0430   CO2 25 07/15/2016 0906   BUN 20.7 07/15/2016 0906   CREATININE 1.1 07/15/2016 0906      Component Value Date/Time   CALCIUM 9.6 07/15/2016 0906   ALKPHOS 29 (L) 07/15/2016 0906   AST 19 07/15/2016 0906   ALT 13 07/15/2016 0906   BILITOT 1.13 07/15/2016 0906       RADIOGRAPHIC STUDIES: Ct Chest W Contrast  Result Date:  07/15/2016 CLINICAL DATA:  Left breast cancer.  Lymphadenopathy. EXAM: CT CHEST, ABDOMEN, AND PELVIS WITH CONTRAST TECHNIQUE: Multidetector CT imaging of the chest, abdomen and pelvis was performed following the standard protocol during bolus administration of intravenous contrast. CONTRAST:  138mL ISOVUE-300 IOPAMIDOL (ISOVUE-300) INJECTION 61% COMPARISON:  07/16/2015 FINDINGS: CT CHEST FINDINGS Cardiovascular: Heart is enlarged. Coronary artery calcification is noted. Atherosclerotic calcification is noted in the wall of the thoracic aorta. Mediastinum/Nodes: 9 mm short axis left thoracic inlet lymph node is stable. Small lymph nodes in the right thoracic inlet are also unchanged. Stable 14 mm short axis precarinal lymph node. 18 mm short axis subcarinal lymph node is unchanged. Calcified nodal tissue noted right hilum. No hilar lymphadenopathy. Abnormal soft tissue adjacent to the distal esophagus measures 2.4 cm in short axis today, stable in the interval. The esophagus has normal imaging features. There is no axillary lymphadenopathy. Lungs/Pleura: Mosaic ground-glass attenuation in the lungs likely related to small airway disease and air trapping. 4 mm lingular nodule (image 66) is unchanged. No suspicious pulmonary nodule or mass. No focal airspace consolidation. Musculoskeletal: Bone windows reveal no worrisome lytic or sclerotic osseous lesions. Stable mild right retrocrural lymphadenopathy. CT ABDOMEN PELVIS FINDINGS Hepatobiliary: The liver appears enlarged. No focal abnormality. Calcified gallstones measure up to 10 mm. No intrahepatic or extrahepatic biliary dilation. Pancreas: No focal mass lesion. No dilatation of the main duct. No intraparenchymal cyst. No peripancreatic edema. Spleen: Spleen appears enlarged.  Calcified granulomata. Adrenals/Urinary Tract: No adrenal nodule or mass. Central sinus cysts noted left kidney No evidence for hydroureter. Bladder largely obscured by streak artifact from  left hip replacement. Stomach/Bowel: Tiny hiatal hernia. Stomach otherwise unremarkable. Duodenum is normally positioned as is the ligament of Treitz. No small bowel wall thickening. No small bowel dilatation. The terminal ileum is normal. The appendix is not visualized, but there is no edema or inflammation in the region of the cecum. Diverticular changes are noted in the left colon without evidence of diverticulitis. Vascular/Lymphatic: There is abdominal aortic atherosclerosis without aneurysm. 17 mm short axis hepatoduodenal ligament lymph node is unchanged. A amorphous para-aortic retroperitoneal lymphadenopathy appears stable. 2.1 cm left para-aortic nodal conglomeration is stable. 1.3 cm aortocaval short axis lymph node measured previously is 1.4 cm today. No pelvic sidewall lymphadenopathy. Reproductive: Calcified  fibroids noted in the uterus. No adnexal mass. Other: No intraperitoneal free fluid. Musculoskeletal: Status post left total hip replacement. IMPRESSION: 1. Previously described lymphadenopathy in the thoracic inlets, chest, and abdomen is stable in the interval. 2. No change posterior left upper lobe tiny pulmonary nodule. 3. Hepatosplenomegaly. 4.  Abdominal Aortic Atherosclerois Electronically Signed   By: Misty Stanley M.D.   On: 07/15/2016 14:47   Ct Abdomen Pelvis W Contrast  Result Date: 07/15/2016 CLINICAL DATA:  Left breast cancer.  Lymphadenopathy. EXAM: CT CHEST, ABDOMEN, AND PELVIS WITH CONTRAST TECHNIQUE: Multidetector CT imaging of the chest, abdomen and pelvis was performed following the standard protocol during bolus administration of intravenous contrast. CONTRAST:  135mL ISOVUE-300 IOPAMIDOL (ISOVUE-300) INJECTION 61% COMPARISON:  07/16/2015 FINDINGS: CT CHEST FINDINGS Cardiovascular: Heart is enlarged. Coronary artery calcification is noted. Atherosclerotic calcification is noted in the wall of the thoracic aorta. Mediastinum/Nodes: 9 mm short axis left thoracic inlet lymph  node is stable. Small lymph nodes in the right thoracic inlet are also unchanged. Stable 14 mm short axis precarinal lymph node. 18 mm short axis subcarinal lymph node is unchanged. Calcified nodal tissue noted right hilum. No hilar lymphadenopathy. Abnormal soft tissue adjacent to the distal esophagus measures 2.4 cm in short axis today, stable in the interval. The esophagus has normal imaging features. There is no axillary lymphadenopathy. Lungs/Pleura: Mosaic ground-glass attenuation in the lungs likely related to small airway disease and air trapping. 4 mm lingular nodule (image 66) is unchanged. No suspicious pulmonary nodule or mass. No focal airspace consolidation. Musculoskeletal: Bone windows reveal no worrisome lytic or sclerotic osseous lesions. Stable mild right retrocrural lymphadenopathy. CT ABDOMEN PELVIS FINDINGS Hepatobiliary: The liver appears enlarged. No focal abnormality. Calcified gallstones measure up to 10 mm. No intrahepatic or extrahepatic biliary dilation. Pancreas: No focal mass lesion. No dilatation of the main duct. No intraparenchymal cyst. No peripancreatic edema. Spleen: Spleen appears enlarged.  Calcified granulomata. Adrenals/Urinary Tract: No adrenal nodule or mass. Central sinus cysts noted left kidney No evidence for hydroureter. Bladder largely obscured by streak artifact from left hip replacement. Stomach/Bowel: Tiny hiatal hernia. Stomach otherwise unremarkable. Duodenum is normally positioned as is the ligament of Treitz. No small bowel wall thickening. No small bowel dilatation. The terminal ileum is normal. The appendix is not visualized, but there is no edema or inflammation in the region of the cecum. Diverticular changes are noted in the left colon without evidence of diverticulitis. Vascular/Lymphatic: There is abdominal aortic atherosclerosis without aneurysm. 17 mm short axis hepatoduodenal ligament lymph node is unchanged. A amorphous para-aortic retroperitoneal  lymphadenopathy appears stable. 2.1 cm left para-aortic nodal conglomeration is stable. 1.3 cm aortocaval short axis lymph node measured previously is 1.4 cm today. No pelvic sidewall lymphadenopathy. Reproductive: Calcified fibroids noted in the uterus. No adnexal mass. Other: No intraperitoneal free fluid. Musculoskeletal: Status post left total hip replacement. IMPRESSION: 1. Previously described lymphadenopathy in the thoracic inlets, chest, and abdomen is stable in the interval. 2. No change posterior left upper lobe tiny pulmonary nodule. 3. Hepatosplenomegaly. 4.  Abdominal Aortic Atherosclerois Electronically Signed   By: Misty Stanley M.D.   On: 07/15/2016 14:47    ASSESSMENT AND PLAN:  This is a very pleasant 76 years old white female with persistent lymphadenopathy as well as mild hepatosplenomegaly suspicious for low-grade lymphoma. She is currently on observation and the recent CT scan of the chest, abdomen and pelvis showed no evidence for disease progression. Her CBC today is unremarkable except for mild leukocytopenia  and anemia. I recommended for the patient to continue on observation for now with repeat CT scan of the chest, abdomen and pelvis in one year. For the hypertension, she will continue taking her blood pressure medication as prescribed. She was advised to call immediately if she has any concerning symptoms in the interval. The patient voices understanding of current disease status and treatment options and is in agreement with the current care plan. All questions were answered. The patient knows to call the clinic with any problems, questions or concerns. We can certainly see the patient much sooner if necessary. I spent 10 minutes counseling the patient face to face. The total time spent in the appointment was 15 minutes.  Disclaimer: This note was dictated with voice recognition software. Similar sounding words can inadvertently be transcribed and may not be corrected upon  review.

## 2016-07-21 NOTE — Telephone Encounter (Signed)
Gave patient AVS and calender per 4/23 los.  

## 2016-07-30 ENCOUNTER — Encounter: Payer: Self-pay | Admitting: Cardiology

## 2016-07-30 ENCOUNTER — Ambulatory Visit (INDEPENDENT_AMBULATORY_CARE_PROVIDER_SITE_OTHER): Payer: PPO | Admitting: Cardiology

## 2016-07-30 VITALS — BP 118/68 | HR 90 | Ht 66.0 in | Wt 249.8 lb

## 2016-07-30 DIAGNOSIS — Z7901 Long term (current) use of anticoagulants: Secondary | ICD-10-CM

## 2016-07-30 DIAGNOSIS — I5032 Chronic diastolic (congestive) heart failure: Secondary | ICD-10-CM | POA: Diagnosis not present

## 2016-07-30 DIAGNOSIS — I482 Chronic atrial fibrillation: Secondary | ICD-10-CM

## 2016-07-30 DIAGNOSIS — I4821 Permanent atrial fibrillation: Secondary | ICD-10-CM

## 2016-07-30 NOTE — Patient Instructions (Signed)
Medication Instructions:  Your physician recommends that you continue on your current medications as directed. Please refer to the Current Medication list given to you today.   Labwork: None  Testing/Procedures: None  Follow-Up: Your physician wants you to follow-up in: 6 months with Dr. Skains' assistant, [place]. You will receive a reminder letter in the mail two months in advance. If you don't receive a letter, please call our office to schedule the follow-up appointment.   Your physician wants you to follow-up in: 1 year with Dr. Skains. You will receive a reminder letter in the mail two months in advance. If you don't receive a letter, please call our office to schedule the follow-up appointment.   Any Other Special Instructions Will Be Listed Below (If Applicable).     If you need a refill on your cardiac medications before your next appointment, please call your pharmacy.   

## 2016-07-30 NOTE — Progress Notes (Signed)
Patient ID: Kristin Hood, female   DOB: 25-Nov-1940, 76 y.o.   MRN: [id number]      [address], [address], [place]  [address] Phone: [phone] Fax:  [phone]  Date:  Jul 31, 2016   ID:  Kristin Hood, DOB [birth year], MRN [id number]  PCP:  Gennette Pac, MD   History of Present Illness: Kristin Hood is a 76 y.o. female with atrial fibrillation discovered on 08/20/12, chronic anticoagulation, COPD, prior stroke, received TPA for cerebral artery occlusion (could not raise arm-8/13) here for followup. Lives next door to [facility] and [employer] [facility]    Echocardiogram demonstrated mild to moderate left atrial enlargement, normal ejection fraction, mild aortic stenosis, MAC, mild mitral regurgitation.  Had 3 knee replacements.   She underwent catheterization on 08/14/11 which showed no evidence of coronary artery disease. LVEDP was 14 mm mercury.  Went to ER on 12/10/12 had a terrific pain in her chest. Lasted about hour at home BP 159/100, 3 times. Went on to ER. Was worried. Once she got to [facility] she quit hurting. Sent to ER. Wanted to keep her overnight. Did troponin did well. Did OK. Prob GERD.   Went again to [facility]. Afib increased rate. PET scan. 3 nodules one thyroid. Endocrine, no good sample.   Unfortunately, her son-in-law died 2 weeks after neck surgery from pulmonary embolism.  2014/08/01 - Hip surgery. Dr. Wynelle Link. She has had extensive workup including oncology workup for nodules in her lungs, lymph nodes, thyroid nodule. She will be followed up on a yearly basis and oncology. Reassurance. Overall, no chest pain other than mild GERD at night after dinner. No syncope, no change in shortness of breath.  05/16/15-baseline shortness of breath. She did go to the emergency room because of centralized chest discomfort. Troponin was normal, EKG unremarkable. Thankfully she started taking Zantac and this was improved.  11/12/15-reassuring thyroid tissue upon  excision. No cancer. Shortness of breath continues. Baseline. On Symbicort. COPD. Eliquis has been expensive. Trying to get assistance.  07/31/16 - AFIB, Jonne Ply) died. Needs toe repositioned. Right varicose vein. No significant shortness of breath. Occasionally she will still feel palpitations in the evening hours, around 4 PM. She has taken an extra Inderal at that time which seems to help area she recounted a story where she felt it racing, her grandson asked her if she wanted to go to dinner and she forgot about it and did not have any problems. Once again Dayna and Christell Faith are her neighbor.  Wt Readings from Last 3 Encounters:  07/31/16 249 lb 12.8 oz (113.3 kg)  07/21/16 255 lb 8 oz (115.9 kg)  06/30/16 251 lb 12.8 oz (114.2 kg)     Past Medical History:  Diagnosis Date  . Anginal pain ([place])   . Arthritis   . Atrial fibrillation ([place]) 07/2012   Eliquis, rate controlled  . Breast cancer ([place]) 2001   left mastectomy  . Coarse tremors    , Essential  . Complication of anesthesia    SMALLER TUBE FOR INTUBATION AS GAGS ON TUBE  . COPD (chronic obstructive pulmonary disease) ([place])   . CVA (cerebral vascular accident) ([place]) 2013  . Dysrhythmia 08/20/2012   Atrial Fibrillation  . Family history of adverse reaction to anesthesia    mother experienced pain with intubation   . GERD (gastroesophageal reflux disease)    history of   . Hard of hearing   . Heart murmur   .  History of chemotherapy 11/13/1998 to 2010   Adriamycin/Docotaxol, Dexorubicin/Taxotere, Femara, Tamoxifen  . History of hiatal hernia    history of   . Hx of adenomatous colonic polyps 2006   , Benign  . Hypercholesteremia   . Hypertension    , With mild concentric left ventricular hypertrophy  . Mixed dyslipidemia   . Shingles   . Shortness of breath dyspnea    on exertion   . Spider veins   . Stroke ([place]) 11/17/2011   left side brain-speech-TPA  . Thyroid nodule   . Tremor, essential   . Valvular  sclerosis 09/23/2003   without stenosis  . Varicose veins     Past Surgical History:  Procedure Laterality Date  . APPENDECTOMY  07/10/1957  . BIOPSY THYROID  12/01/2013   ultrasound  . BREAST SURGERY    . BUNIONECTOMY  11/29/1988   bilateral with hammer toes  . CARDIAC CATHETERIZATION  07/2010   , Without significant CAD  . CARDIAC CATHETERIZATION  07/2011   [name]  . CESAREAN SECTION    . CESAREAN SECTION  05/1968, 07/1965  . EYE SURGERY  01/31/2013   eye lid; cataract surgery bilat   . JOINT REPLACEMENT  05/2001   left knee  . JOINT REPLACEMENT  11/2001   right knee  . JOINT REPLACEMENT  01/2008   redo right knee  . KNEE SURGERY  2009   ,Total knee replacement  . LEFT HEART CATHETERIZATION WITH CORONARY ANGIOGRAM N/A 08/14/2011   Procedure: LEFT HEART CATHETERIZATION WITH CORONARY ANGIOGRAM;  Surgeon: [name], MD;  Location: [facility] CATH LAB;  Service: Cardiovascular;  Laterality: N/A;  . Left Rotator Cuff    . MASTECTOMY  10/19/1998   Radical, left -with lymph nodes (8 total)  . Reverse bunionectomy  08/29/1993   removal of Tibial Sesamoid-left  . TEE WITHOUT CARDIOVERSION  11/19/2011   Procedure: TRANSESOPHAGEAL ECHOCARDIOGRAM (TEE);  Surgeon: [name], MD;  Location: [facility] ENDOSCOPY;  Service: Cardiovascular;  Laterality: N/A;  . THYROIDECTOMY N/A 03/02/2015   Procedure: TOTAL THYROIDECTOMY;  Surgeon: Armandina Gemma, MD;  Location: [facility] ORS;  Service: General;  Laterality: N/A;  . TONSILLECTOMY    . TOTAL HIP ARTHROPLASTY Left 10/11/2014   Procedure: LEFT TOTAL HIP ARTHROPLASTY ANTERIOR APPROACH;  Surgeon: [name], MD;  Location: [facility] ORS;  Service: Orthopedics;  Laterality: Left;    Current Outpatient Prescriptions  Medication Sig Dispense Refill  . albuterol (PROVENTIL HFA;VENTOLIN HFA) 108 (90 Base) MCG/ACT inhaler Inhale 2 puffs into the lungs every 4 (four) hours as needed for wheezing or shortness of breath. 1 Inhaler 5  . amLODipine (NORVASC) 5 MG tablet  Take 5 mg by mouth daily.    Marland Kitchen amoxicillin (AMOXIL) 500 MG capsule Take 500 mg by mouth as directed. Before dental appointments    . apixaban (ELIQUIS) 5 MG TABS tablet Take 1 tablet (5 mg total) by mouth 2 (two) times daily. 180 tablet 3  . atorvastatin (LIPITOR) 10 MG tablet Take 10 mg by mouth daily after supper.     . fenofibrate 160 MG tablet Take 160 mg by mouth daily after supper.     . ferrous sulfate 325 (65 FE) MG EC tablet Take 325 mg by mouth daily after supper.     . fluticasone (FLONASE) 50 MCG/ACT nasal spray Place 2 sprays into both nostrils daily. 16 g 2  . furosemide (LASIX) 20 MG tablet Take 1 tablet (20 mg total) by mouth daily. 90 tablet 3  . ketoconazole (  NIZORAL) 2 % cream Reported on 05/02/2015  0  . levothyroxine (SYNTHROID, LEVOTHROID) 175 MCG tablet TAKE 1 TABLET BY MOUTH DAILY BEFORE BREAKFAST. 30 tablet 2  . losartan (COZAAR) 100 MG tablet Take 100 mg by mouth daily.    . Multiple Vitamin (MULTIVITAMIN WITH MINERALS) TABS tablet Take 1 tablet by mouth daily.    Marland Kitchen omeprazole (PRILOSEC) 20 MG capsule Take 1 capsule (20 mg total) by mouth 2 (two) times daily. 60 capsule 6  . predniSONE (STERAPRED UNI-PAK 48 TAB) 5 MG (48) TBPK tablet Take 5 mg by mouth as directed. see package  0  . propranolol ER (INDERAL LA) 60 MG 24 hr capsule Take 2 tablets by mouth in the a.m, 1 tablet by mouth in the afternoon, and 1 tablet by mouth at night. 360 capsule 1  . Tiotropium Bromide Monohydrate (SPIRIVA RESPIMAT) 2.5 MCG/ACT AERS Inhale 2 puffs into the lungs daily. 3 Inhaler 3   No current facility-administered medications for this visit.     Allergies:    Allergies  Allergen Reactions  . Floxin [Ofloxacin] Nausea Only and Other (See Comments)    Dizziness, Vertigo Dizziness, Vertigo    Social History:  The patient  reports that she quit smoking about 24 years ago. Her smoking use included Cigarettes. She has a 60.00 pack-year smoking history. She has never used smokeless  tobacco. She reports that she drinks about 0.6 oz of alcohol per week . She reports that she does not use drugs.   ROS:  Unless specified above all other review of systems negative.  PHYSICAL EXAM: VS:  BP 118/68   Pulse 90   Ht 5' 6"  (1.676 m)   Wt 249 lb 12.8 oz (113.3 kg)   BMI 40.32 kg/m  GEN: Well nourished, well developed, in no acute distress  HEENT: normal  Neck: no JVD, carotid bruits, or masses Cardiac: irreg normal rate; no murmurs, rubs, or gallops, trace edema  Respiratory:  clear to auscultation bilaterally, normal work of breathing GI: soft, nontender, nondistended, + BS, overweight MS: no deformity or atrophy  Skin: warm and dry, no rash, Varicose veins.  Neuro:  Alert and Oriented x 3, Strength and sensation are intact Psych: euthymic mood, full affect   EKG: 07/31/14-76, atrial fibrillation-no significant change from prior. AFIB 77 No ST changes. Personally viewed  ASSESSMENT AND PLAN:  Permanent atrial fibrillation  - Currently well rate controlled.  - Occasionally will feel increased heart rate in the evening hours, late afternoon. She takes an extra Inderal if this happens. No changes made in regimen.  History of stroke  - Increases her future stroke risk. Continue with chronic anticoagulation.  Chronic anticoagulation  - She has not had any bleeding issues. Continue with Eliquis.  - Monitor basic metabolic profile and CBC  GERD  - Zantac. Prior ER visit in January 2017. Reassurance. Prior cath reassuring.  Obesity  - Continue to encourage weight loss.  Chronic diastolic heart failure  - She is watching her daily weights. They have been stable. She is taking Lasix 20 mg a day. Continue with medications as above.  Varicose veins  - She has seen Dr. Oneida Alar in the past. She may go back and talk to him again.  Essential hypertension  - Doing well. Well controlled.  Grieving the loss of her husband, Clair Gulling  - She is very appreciative of Melina Copa and  Thurmond Butts her neighbor who helped significantly.  Six-month follow-up with APP, 12 months with me  Signed, [name], MD [facility]  07/30/2016 11:16 AM

## 2016-08-05 DIAGNOSIS — M1811 Unilateral primary osteoarthritis of first carpometacarpal joint, right hand: Secondary | ICD-10-CM | POA: Diagnosis not present

## 2016-09-08 DIAGNOSIS — M2031 Hallux varus (acquired), right foot: Secondary | ICD-10-CM | POA: Diagnosis not present

## 2016-09-08 DIAGNOSIS — M2041 Other hammer toe(s) (acquired), right foot: Secondary | ICD-10-CM | POA: Diagnosis not present

## 2016-09-08 DIAGNOSIS — L84 Corns and callosities: Secondary | ICD-10-CM | POA: Diagnosis not present

## 2016-09-08 DIAGNOSIS — M2032 Hallux varus (acquired), left foot: Secondary | ICD-10-CM | POA: Diagnosis not present

## 2016-09-10 ENCOUNTER — Other Ambulatory Visit: Payer: Self-pay

## 2016-09-10 ENCOUNTER — Other Ambulatory Visit: Payer: Self-pay | Admitting: *Deleted

## 2016-09-10 MED ORDER — APIXABAN 5 MG PO TABS: 5.0000 mg | ORAL_TABLET | Freq: Two times a day (BID) | ORAL | 3 refills | Status: DC

## 2016-09-10 MED ORDER — LEVOTHYROXINE SODIUM 175 MCG PO TABS: ORAL_TABLET | ORAL | 2 refills | Status: DC

## 2016-09-11 ENCOUNTER — Other Ambulatory Visit: Payer: Self-pay

## 2016-09-11 MED ORDER — LEVOTHYROXINE SODIUM 175 MCG PO TABS: ORAL_TABLET | ORAL | 2 refills | Status: DC

## 2016-09-19 ENCOUNTER — Other Ambulatory Visit: Payer: Self-pay

## 2016-09-19 MED ORDER — LEVOTHYROXINE SODIUM 175 MCG PO TABS: ORAL_TABLET | ORAL | 1 refills | Status: DC

## 2016-09-30 DIAGNOSIS — Z853 Personal history of malignant neoplasm of breast: Secondary | ICD-10-CM | POA: Diagnosis not present

## 2016-10-07 ENCOUNTER — Ambulatory Visit: Payer: PPO | Admitting: Internal Medicine

## 2016-10-07 ENCOUNTER — Other Ambulatory Visit: Payer: Self-pay | Admitting: Internal Medicine

## 2016-10-27 ENCOUNTER — Encounter: Payer: Self-pay | Admitting: Internal Medicine

## 2016-10-27 ENCOUNTER — Ambulatory Visit (INDEPENDENT_AMBULATORY_CARE_PROVIDER_SITE_OTHER): Payer: PPO | Admitting: Internal Medicine

## 2016-10-27 VITALS — BP 128/80 | HR 73 | Wt 247.0 lb

## 2016-10-27 DIAGNOSIS — E890 Postprocedural hypothyroidism: Secondary | ICD-10-CM

## 2016-10-27 DIAGNOSIS — Z9889 Other specified postprocedural states: Secondary | ICD-10-CM

## 2016-10-27 LAB — TSH: TSH: 3 u[IU]/mL (ref 0.35–4.50)

## 2016-10-27 LAB — T4, FREE: Free T4: 1.59 ng/dL (ref 0.60–1.60)

## 2016-10-27 NOTE — Progress Notes (Signed)
Patient ID: Kristin Hood, female   DOB: 10-02-1940, 76 y.o.   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 Last visit 7 months ago.  She was admitted with Acute respiratory failure and CHF 05/2016 >> started Lasix >> lost 20 lbs.  She lost her husband after he fell and had a concussion in 06/2016.  Reviewed hx: She described having L-sided chest pressure (11/11/2013) >> went to the ED: found to be in A fib. Also had a Chest CT: Mediastinal and retroperitoneal lymph nodes, so a PET scan was recommended: a thyroid nodule appeared hypermetabolic.   PET scan (63/78/5885): hypermetabolic R thyroid nodule  Thyroid U/S (11/21/2013): R 2.7 x 1.8 cm nodule, heterogeneous, with coarse calcifications  R nodule Bx (12/01/2013): FLUS  11/30/2013 TSH: 6.84 (0.34-4.5).  R nodule Bx (07/11/2014): FLUS  07/31/2014: Called and d/w pt about the results of the Afirma test, which is "suspicious for neoplasm" (test results scanned under Media tab). This carries a risk of 75% for cancer. In this case, I suggested total thyroidectomy.   03/02/2015: Total thyroidectomy - Dr Harlow Asa >> benign pathology! (surprisingly)  She is now on Levothyroxine 175 mcg daily (increased in 09/2015 and changed to generic - pt feels well on this): - in am - fasting - at least 30 min from b'fast - no Ca - + Fe at noon oe later - + MVI at night - + Omeprazole at night but not daily - not on Biotin  Reviewed TFTs: Lab Results  Component Value Date   TSH 4.54 (H) 03/04/2016   TSH 6.46 (H) 11/19/2015   TSH 17.91 (H) 10/03/2015   TSH 25.94 (H) 08/22/2015   TSH 31.84 (H) 07/20/2015   TSH 36.21 (H) 06/06/2015   TSH 4.02 12/11/2014   FREET4 1.66 (H) 03/04/2016   FREET4 1.55 11/19/2015   FREET4 1.26 10/03/2015   FREET4 1.00 08/22/2015   FREET4 1.04 07/20/2015   FREET4 1.00  06/06/2015   FREET4 1.13 12/11/2014  04/11/2015: TSH 40.5  Pt denies: - feeling nodules in neck - hoarseness - dysphagia - choking - SOB with lying down - + but has mucus in am  Pt does have a FH of thyroid ds.: mother (hypothyroidism). No FH of thyroid cancer. No h/o radiation tx to head or neck. She had ChTx for her BrCA - dx 2000.  No seaweed or kelp. No recent contrast studies. No herbal supplements. No Biotin use. No recent steroids use.   ROS: Constitutional: no weight gain/no weight loss, no fatigue, no subjective hyperthermia, no subjective hypothermia Eyes: no blurry vision, no xerophthalmia ENT: no sore throat, no nodules palpated in throat, no dysphagia, no odynophagia, no hoarseness Cardiovascular: no CP/+ SOB/no palpitations/+ leg swelling Respiratory: no cough/+ SOB/no wheezing Gastrointestinal: no N/no V/no D/no C/no acid reflux Musculoskeletal: no muscle aches/no joint aches Skin: no rashes, no hair loss Neurological: no tremors/no numbness/no tingling/no dizziness  I reviewed pt's medications, allergies, PMH, social hx, family hx, and changes were documented in the history of present illness. Otherwise, unchanged from my initial visit note.  Past Medical History:  Diagnosis Date  . Anginal pain ([place])   . Arthritis   . Atrial fibrillation ([place]) 07/2012   Eliquis, rate controlled  . Breast cancer ([place]) 2001   left mastectomy  . Coarse tremors    , Essential  . Complication  of anesthesia    SMALLER TUBE FOR INTUBATION AS GAGS ON TUBE  . COPD (chronic obstructive pulmonary disease) ([place])   . CVA (cerebral vascular accident) ([place]) 2013  . Dysrhythmia 08/20/2012   Atrial Fibrillation  . Family history of adverse reaction to anesthesia    mother experienced pain with intubation   . GERD (gastroesophageal reflux disease)    history of   . Hard of hearing   . Heart murmur   . History of chemotherapy 11/13/1998 to 2010   Adriamycin/Docotaxol,  Dexorubicin/Taxotere, Femara, Tamoxifen  . History of hiatal hernia    history of   . Hx of adenomatous colonic polyps 2006   , Benign  . Hypercholesteremia   . Hypertension    , With mild concentric left ventricular hypertrophy  . Mixed dyslipidemia   . Shingles   . Shortness of breath dyspnea    on exertion   . Spider veins   . Stroke ([place]) 11/17/2011   left side brain-speech-TPA  . Thyroid nodule   . Tremor, essential   . Valvular sclerosis 09/23/2003   without stenosis  . Varicose veins    Past Surgical History:  Procedure Laterality Date  . APPENDECTOMY  07/10/1957  . BIOPSY THYROID  12/01/2013   ultrasound  . BREAST SURGERY    . BUNIONECTOMY  11/29/1988   bilateral with hammer toes  . CARDIAC CATHETERIZATION  07/2010   , Without significant CAD  . CARDIAC CATHETERIZATION  07/2011   [name]  . CESAREAN SECTION    . CESAREAN SECTION  05/1968, 07/1965  . EYE SURGERY  01/31/2013   eye lid; cataract surgery bilat   . JOINT REPLACEMENT  05/2001   left knee  . JOINT REPLACEMENT  11/2001   right knee  . JOINT REPLACEMENT  01/2008   redo right knee  . KNEE SURGERY  2009   ,Total knee replacement  . LEFT HEART CATHETERIZATION WITH CORONARY ANGIOGRAM N/A 08/14/2011   Procedure: LEFT HEART CATHETERIZATION WITH CORONARY ANGIOGRAM;  Surgeon: [name], MD;  [facility];  Service: Cardiovascular;  Laterality: N/A;  . Left Rotator Cuff    . MASTECTOMY  10/19/1998   Radical, left -with lymph nodes (8 total)  . Reverse bunionectomy  08/29/1993   removal of Tibial Sesamoid-left  . TEE WITHOUT CARDIOVERSION  11/19/2011   Procedure: TRANSESOPHAGEAL ECHOCARDIOGRAM (TEE);  Surgeon: [name], MD;  Location: [facility] ENDOSCOPY;  Service: Cardiovascular;  Laterality: N/A;  . THYROIDECTOMY N/A 03/02/2015   Procedure: TOTAL THYROIDECTOMY;  Surgeon: Armandina Gemma, MD;  Location: [facility] ORS;  Service: General;  Laterality: N/A;  . TONSILLECTOMY    . TOTAL HIP ARTHROPLASTY Left  10/11/2014   Procedure: LEFT TOTAL HIP ARTHROPLASTY ANTERIOR APPROACH;  Surgeon: [name], MD;  Location: [facility] ORS;  Service: Orthopedics;  Laterality: Left;   History   Social History  . Marital Status: Married    Spouse Name: N/A    Number of Children: 2   Occupational History  . homemaker   Social History Main Topics  . Smoking status: Former Smoker -- 2.50 packs/day for 13 years    Types: Cigarettes    Quit date: 01/30/1992  . Smokeless tobacco: Never Used  . Alcohol Use:     3 drink(s) per week  . Drug Use: No   Current Outpatient Prescriptions on File Prior to Visit  Medication Sig Dispense Refill  . albuterol (PROVENTIL HFA;VENTOLIN HFA) 108 (90 Base) MCG/ACT inhaler Inhale 2 puffs into the lungs  every 4 (four) hours as needed for wheezing or shortness of breath. 1 Inhaler 5  . amLODipine (NORVASC) 5 MG tablet Take 5 mg by mouth daily.    Marland Kitchen amoxicillin (AMOXIL) 500 MG capsule Take 500 mg by mouth as directed. Before dental appointments    . apixaban (ELIQUIS) 5 MG TABS tablet Take 1 tablet (5 mg total) by mouth 2 (two) times daily. 180 tablet 3  . atorvastatin (LIPITOR) 10 MG tablet Take 10 mg by mouth daily after supper.     . fenofibrate 160 MG tablet Take 160 mg by mouth daily after supper.     . ferrous sulfate 325 (65 FE) MG EC tablet Take 325 mg by mouth daily after supper.     . fluticasone (FLONASE) 50 MCG/ACT nasal spray Place 2 sprays into both nostrils daily. 16 g 2  . furosemide (LASIX) 20 MG tablet Take 1 tablet (20 mg total) by mouth daily. 90 tablet 3  . ketoconazole (NIZORAL) 2 % cream Reported on 05/02/2015  0  . levothyroxine (SYNTHROID, LEVOTHROID) 175 MCG tablet TAKE 1 TABLET BY MOUTH DAILY BEFORE BREAKFAST. 30 tablet 2  . losartan (COZAAR) 100 MG tablet Take 100 mg by mouth daily.    . Multiple Vitamin (MULTIVITAMIN WITH MINERALS) TABS tablet Take 1 tablet by mouth daily.    Marland Kitchen omeprazole (PRILOSEC) 20 MG capsule Take 1 capsule (20 mg total) by mouth 2  (two) times daily. 60 capsule 6  . predniSONE (STERAPRED UNI-PAK 48 TAB) 5 MG (48) TBPK tablet Take 5 mg by mouth as directed. see package  0  . propranolol ER (INDERAL LA) 60 MG 24 hr capsule Take 2 tablets by mouth in the a.m, 1 tablet by mouth in the afternoon, and 1 tablet by mouth at night. 360 capsule 1  . Tiotropium Bromide Monohydrate (SPIRIVA RESPIMAT) 2.5 MCG/ACT AERS Inhale 2 puffs into the lungs daily. 3 Inhaler 3   No current facility-administered medications on file prior to visit.    Allergies  Allergen Reactions  . Floxin [Ofloxacin] Nausea Only and Other (See Comments)    Dizziness, Vertigo Dizziness, Vertigo   Family History  Problem Relation Age of Onset  . Heart disease Mother   . Aortic stenosis Mother   . Heart disease Father   . Heart failure Father   . Heart attack Father    PE: BP 128/80 (BP Location: Left Arm, Patient Position: Sitting)   Pulse 73   Wt 247 lb (112 kg)   SpO2 97%   BMI 39.87 kg/m  Body mass index is 39.87 kg/m. Wt Readings from Last 3 Encounters:  10/27/16 247 lb (112 kg)  07/30/16 249 lb 12.8 oz (113.3 kg)  07/21/16 255 lb 8 oz (115.9 kg)   Constitutional: overweight, in NAD Eyes: PERRLA, EOMI, no exophthalmos ENT: moist mucous membranes, no thyromegaly, thyroidectomy scar healed, no cervical lymphadenopathy Cardiovascular: RRR, No MRG Respiratory: CTA B Gastrointestinal: abdomen soft, NT, ND, BS+ Musculoskeletal: no deformities, strength intact in all 4 Skin: moist, warm, no rashes Neurological: no tremor with outstretched hands, DTR 0/4 in B knees (previous sx)  ASSESSMENT: 1. Postoperative hypothyroidism - h/o R thyroid nodule, hypermetabolic on PET, FLUS x2 on Bx, Afirma + >> benign final pathology  2. S/p total thyroidectomy for R thyroid nodule  PLAN: 1. 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 With all the above factors pointing towards thyroid cancer,I recommended surgery >> she had this 03/02/2015 and very surprisingly, the final pathology was benign. - latest thyroid labs reviewed with pt >> better, but not normal yet. We did not change the LT4 dose due to continuous improvement. - she continues on 175 LT4 mcg daily - pt feels good on this dose. - we discussed about taking the thyroid hormone every day, with water, >30 minutes before breakfast, separated by >4 hours from acid reflux medications, calcium, iron, multivitamins. Pt. is taking it correctly - will check thyroid tests today: TSH and fT4 - If labs are abnormal, she will need to return for repeat TFTs in 1.5 months - OTW, RTC in 1 year  2. S/p total thyroidectomy for R thyroid nodule - no dysesthesia or pain at cervical sx site  Office Visit on 10/27/2016  Component Date Value Ref Range Status  . TSH 10/27/2016 3.00  0.35 - 4.50 uIU/mL Final  . Free T4 10/27/2016 1.59  0.60 - 1.60 ng/dL Final   Comment: Specimens from patients who are undergoing biotin therapy and /or ingesting biotin supplements may contain high levels of biotin.  The higher biotin concentration in these specimens interferes with this Free T4 assay.  Specimens that contain high levels  of biotin may cause false high results for this Free T4 assay.  Please interpret results in light of the total clinical presentation of the patient.     Normal TFTs.    Philemon Kingdom, MD PhD [facility]

## 2016-10-27 NOTE — Patient Instructions (Addendum)
Please stop at the lab.  Please continue Synthroid 175 mcg daily.  Take the thyroid hormone every day, with water, at least 30 minutes before breakfast, separated by at least 4 hours from: - acid reflux medications - calcium - iron - multivitamins  Please come back for a follow-up appointment in 1 year.  

## 2016-11-05 DIAGNOSIS — Z6841 Body Mass Index (BMI) 40.0 and over, adult: Secondary | ICD-10-CM | POA: Diagnosis not present

## 2016-11-14 DIAGNOSIS — G8929 Other chronic pain: Secondary | ICD-10-CM | POA: Diagnosis not present

## 2016-11-14 DIAGNOSIS — M545 Low back pain: Secondary | ICD-10-CM | POA: Diagnosis not present

## 2016-12-02 DIAGNOSIS — M1811 Unilateral primary osteoarthritis of first carpometacarpal joint, right hand: Secondary | ICD-10-CM | POA: Diagnosis not present

## 2016-12-03 ENCOUNTER — Telehealth: Payer: Self-pay

## 2016-12-03 ENCOUNTER — Other Ambulatory Visit: Payer: Self-pay

## 2016-12-03 MED ORDER — APIXABAN 5 MG PO TABS: 5.0000 mg | ORAL_TABLET | Freq: Two times a day (BID) | ORAL | 3 refills | Status: DC

## 2016-12-03 NOTE — Telephone Encounter (Signed)
**Note De-Identified Bret Stamour Obfuscation** I have placed the pts BMS Pt Assistance application and RX for Eliquis in Dr [name] mail bin awaiting his signature.

## 2016-12-09 ENCOUNTER — Ambulatory Visit (INDEPENDENT_AMBULATORY_CARE_PROVIDER_SITE_OTHER): Payer: PPO | Admitting: Emergency Medicine

## 2016-12-09 ENCOUNTER — Encounter: Payer: Self-pay | Admitting: Emergency Medicine

## 2016-12-09 DIAGNOSIS — J449 Chronic obstructive pulmonary disease, unspecified: Secondary | ICD-10-CM

## 2016-12-09 DIAGNOSIS — Z23 Encounter for immunization: Secondary | ICD-10-CM

## 2016-12-09 DIAGNOSIS — J301 Allergic rhinitis due to pollen: Secondary | ICD-10-CM | POA: Diagnosis not present

## 2016-12-09 DIAGNOSIS — R918 Other nonspecific abnormal finding of lung field: Secondary | ICD-10-CM

## 2016-12-09 DIAGNOSIS — J309 Allergic rhinitis, unspecified: Secondary | ICD-10-CM | POA: Insufficient documentation

## 2016-12-09 NOTE — Assessment & Plan Note (Signed)
With associated lymphadenopathy. CT scans as arranged by Dr. [name] have been stable

## 2016-12-09 NOTE — Patient Instructions (Addendum)
Flu shot today.  Please continue your Spiriva daily Use albuterol as needed.  Fluticasone nasal spray as needed Mucinex every evening.  Get your CT scans based on schedule determined by Dr [name].  Follow with Dr [name] in 6 months or sooner if you have any problems

## 2016-12-09 NOTE — Assessment & Plan Note (Signed)
Flu shot today.  Please continue your Spiriva daily Use albuterol as needed.  Mucinex every evening.  Follow with Dr [name] in 6 months or sooner if you have any problems

## 2016-12-09 NOTE — Assessment & Plan Note (Signed)
Fluticasone nasal spray as needed

## 2016-12-09 NOTE — Progress Notes (Signed)
Subjective:    Patient ID: Kristin Hood, female    DOB: 1940/11/24, 76 y.o.   MRN: [id number]  Shortness of Breath  Pertinent negatives include no ear pain, fever, headaches, leg swelling, rash, rhinorrhea, sore throat, vomiting or wheezing.   76 year old woman, former smoker (60 pack years), with history of hypertension, atrial fibrillation, cerebrovascular disease, and breast cancer treated in 2001 w mastectomy, no XRT. She has history of some mediastinal lymphadenopathy and splenomegaly that has been followed by oncology in the aftermath of her breast cancer. She was just admitted to the hospital 10/2014 for . A CT scan of the chest was performed on 11/16/14 personally reviewed today. This showed no evidence of PE but some evidence for air trapping, tracheobronchomalacia, bronchitis. An echocardiogram was performed 8/19 that showed concentric left ventricular hypertrophy with an EF of 60-65%, moderately dilated left atrium, moderately dilated right atrium, mildly dilated right ventricle and an estimated PASP of 46 mmHg. She was treated with albuterol, was sent home w a script - seems to help her.  She sleeps on her L side with head on 2 pillows or in a recliner, does not really snore, feels well rested in the am. No witness apneas.     ROV 06/05/16 -- patient has a history of COPD, mediastinal and abd LAD that has been followed on serial imaging. Last CT's done 4/'17 w Dr [name].  Has been managed on Spiriva. She notes that she has had more mucous last few days, more dyspnea, bothering her and waking her at night, getting her choked up. Uses albuterol infrequently. Uncertain if she snores.   ROV 12/09/16 -- This follow-up visit for patient with a history of breast cancer, mediastinal lymphadenopathy that we have followed with serial imaging. Also with a history of COPD, bronchial malacia, cough, pulmonary hypertension. Left CT scan of the chest was in 07/15/16. No change in lymphadenopathy, no  change in posterior left upper lobe tiny pulmonary nodule. She tells me that her breathing is a bit more bothersome - she has needed to do more, more exertion, since the death of her husband. She is using albuterol about 2x a week. She is on Spiriva Respimat. Her cough is well controlled. Fluticasone nasal spray prn, uses mucinex regularly.         Review of Systems  Constitutional: Negative for fever and unexpected weight change.  HENT: Positive for congestion and postnasal drip. Negative for dental problem, ear pain, nosebleeds, rhinorrhea, sinus pressure, sneezing, sore throat and trouble swallowing.   Eyes: Negative for redness and itching.  Respiratory: Positive for shortness of breath. Negative for cough, chest tightness and wheezing.   Cardiovascular: Negative for palpitations and leg swelling.  Gastrointestinal: Negative for nausea and vomiting.  Genitourinary: Negative for dysuria.  Musculoskeletal: Negative for joint swelling.  Skin: Negative for rash.  Neurological: Negative for headaches.  Hematological: Does not bruise/bleed easily.  Psychiatric/Behavioral: Negative for dysphoric mood. The patient is not nervous/anxious.        Objective:   Physical Exam Vitals:   12/09/16 1058  BP: (!) 144/80  Pulse: 81  SpO2: 93%  Weight: 241 lb 12.8 oz (109.7 kg)  Height: 5\' 6"  (1.676 m)   Gen: Pleasant, well-nourished, in no distress,  normal affect  ENT: No lesions,  mouth clear,  oropharynx clear, no postnasal drip  Neck: No JVD, no TMG, no carotid bruits  Lungs: No use of accessory muscles, no dullness to percussion, clear without rales or rhonchi  Cardiovascular: RRR, heart sounds normal, no murmur or gallops, no peripheral edema  Musculoskeletal: No deformities, no cyanosis or clubbing  Neuro: alert, non focal  Skin: Warm, no lesions or rashes    07/16/15 --  COMPARISON:  Chest radiographs have 04/06/2015. Chest CT 11/16/2014. Chest abdomen and pelvic CTs of  06/21/2014.  FINDINGS: CT CHEST FINDINGS  Mediastinum/Lymph Nodes: low left jugular/ supraclavicular adenopathy, including at 9 mm on image 8/series 2. 7 mm on the prior. Aortic and branch vessel atherosclerosis. Moderate cardiomegaly with multivessel coronary artery atherosclerosis. No pericardial effusion. No central pulmonary embolism, on this non-dedicated study.  Mediastinal adenopathy. Subcarinal node measures 1.8 cm on image 29/series 2. Compare 1.4 cm on the prior. Calcified right hilar node is likely related to old granulomatous disease.  Left infrahilar node measures 10 mm on image 30/series 2 and is unchanged.  Periesophageal adenopathy including at 2.8 cm on image 44/series 2. Compare 2.1 cm on the prior.  A tiny hiatal hernia. Preaortic nodes are upper normal size, but increased since the prior. Example 7 mm.  Lungs/Pleura: No pleural fluid. Mosaic attenuation is slightly increased since the prior exam, favored to be related to hypoventilation and resultant air trapping. Left upper lobe pulmonary nodule measures 6 mm, including on image 76/series 4.  Musculoskeletal: No acute osseous abnormality. Left mastectomy. Right subpectoral node is mildly enlarged at 8 mm today versus 6 mm on the prior (when remeasured).      Assessment & Plan:  COPD (chronic obstructive pulmonary disease) ([place]) Flu shot today.  Please continue your Spiriva daily Use albuterol as needed.  Mucinex every evening.  Follow with Dr [name] in 6 months or sooner if you have any problems     Pulmonary nodules With associated lymphadenopathy. CT scans as arranged by Dr. [name] have been stable  Allergic rhinitis Fluticasone nasal spray as needed  Baltazar Apo, MD, PhD 12/09/2016, 11:29 AM [facility] [phone] or if no answer [phone]

## 2016-12-10 ENCOUNTER — Telehealth: Payer: Self-pay | Admitting: Emergency Medicine

## 2016-12-10 MED ORDER — TIOTROPIUM BROMIDE MONOHYDRATE 2.5 MCG/ACT IN AERS: 2.0000 | INHALATION_SPRAY | Freq: Every day | RESPIRATORY_TRACT | 3 refills | Status: DC

## 2016-12-10 NOTE — Telephone Encounter (Signed)
I received forms and printed Rx for Spiriva and placed in RB cubby. Will forward to Ria Comment so she can follow up after signature.

## 2016-12-10 NOTE — Telephone Encounter (Addendum)
Dr [name] has signed the pts pt assistance application and the RX. I have faxed all to [employer] assistance [employer].

## 2016-12-11 NOTE — Telephone Encounter (Signed)
Form have been signed and faxed in. Attempted to call pt to make her aware. There was no answer and I could not leave a message. Will try back later.

## 2016-12-15 NOTE — Telephone Encounter (Signed)
Called and spoke with pt and she is aware that the pt assistance forms have been faxed in for her.  Nothing further is needed.

## 2016-12-26 ENCOUNTER — Encounter: Payer: Self-pay | Admitting: Cardiology

## 2016-12-30 ENCOUNTER — Inpatient Hospital Stay (HOSPITAL_COMMUNITY)
Admission: EM | Admit: 2016-12-30 | Discharge: 2017-01-01 | DRG: 291 | Disposition: A | Discharge status: Home / Self Care | Payer: PPO | Attending: Internal Medicine | Admitting: Internal Medicine

## 2016-12-30 ENCOUNTER — Emergency Department (HOSPITAL_COMMUNITY): Payer: PPO

## 2016-12-30 ENCOUNTER — Encounter (HOSPITAL_COMMUNITY): Payer: Self-pay | Admitting: Emergency Medicine

## 2016-12-30 DIAGNOSIS — K219 Gastro-esophageal reflux disease without esophagitis: Secondary | ICD-10-CM | POA: Diagnosis present

## 2016-12-30 DIAGNOSIS — Z9111 Patient's noncompliance with dietary regimen: Secondary | ICD-10-CM | POA: Diagnosis not present

## 2016-12-30 DIAGNOSIS — I110 Hypertensive heart disease with heart failure: Secondary | ICD-10-CM | POA: Diagnosis not present

## 2016-12-30 DIAGNOSIS — Z7901 Long term (current) use of anticoagulants: Secondary | ICD-10-CM

## 2016-12-30 DIAGNOSIS — I482 Chronic atrial fibrillation: Secondary | ICD-10-CM | POA: Diagnosis not present

## 2016-12-30 DIAGNOSIS — I5033 Acute on chronic diastolic (congestive) heart failure: Secondary | ICD-10-CM | POA: Diagnosis not present

## 2016-12-30 DIAGNOSIS — E785 Hyperlipidemia, unspecified: Secondary | ICD-10-CM | POA: Diagnosis present

## 2016-12-30 DIAGNOSIS — E039 Hypothyroidism, unspecified: Secondary | ICD-10-CM | POA: Diagnosis present

## 2016-12-30 DIAGNOSIS — J441 Chronic obstructive pulmonary disease with (acute) exacerbation: Secondary | ICD-10-CM | POA: Diagnosis not present

## 2016-12-30 DIAGNOSIS — Z8673 Personal history of transient ischemic attack (TIA), and cerebral infarction without residual deficits: Secondary | ICD-10-CM

## 2016-12-30 DIAGNOSIS — G252 Other specified forms of tremor: Secondary | ICD-10-CM | POA: Diagnosis not present

## 2016-12-30 DIAGNOSIS — Z79899 Other long term (current) drug therapy: Secondary | ICD-10-CM | POA: Diagnosis not present

## 2016-12-30 DIAGNOSIS — E782 Mixed hyperlipidemia: Secondary | ICD-10-CM | POA: Diagnosis not present

## 2016-12-30 DIAGNOSIS — Z853 Personal history of malignant neoplasm of breast: Secondary | ICD-10-CM | POA: Diagnosis not present

## 2016-12-30 DIAGNOSIS — I4891 Unspecified atrial fibrillation: Secondary | ICD-10-CM | POA: Diagnosis present

## 2016-12-30 DIAGNOSIS — J9601 Acute respiratory failure with hypoxia: Secondary | ICD-10-CM | POA: Diagnosis not present

## 2016-12-30 DIAGNOSIS — I5031 Acute diastolic (congestive) heart failure: Secondary | ICD-10-CM

## 2016-12-30 DIAGNOSIS — I10 Essential (primary) hypertension: Secondary | ICD-10-CM | POA: Diagnosis present

## 2016-12-30 DIAGNOSIS — Z87891 Personal history of nicotine dependence: Secondary | ICD-10-CM

## 2016-12-30 DIAGNOSIS — M1991 Primary osteoarthritis, unspecified site: Secondary | ICD-10-CM | POA: Diagnosis not present

## 2016-12-30 DIAGNOSIS — R079 Chest pain, unspecified: Secondary | ICD-10-CM | POA: Diagnosis not present

## 2016-12-30 HISTORY — DX: Chronic obstructive pulmonary disease with (acute) exacerbation: J44.1

## 2016-12-30 HISTORY — DX: Acute respiratory failure with hypoxia: J96.01

## 2016-12-30 HISTORY — DX: Acute diastolic (congestive) heart failure: I50.31

## 2016-12-30 LAB — BASIC METABOLIC PANEL
Anion gap: 10 (ref 5–15)
BUN: 21 mg/dL — ABNORMAL HIGH (ref 6–20)
CHLORIDE: 103 mmol/L (ref 101–111)
CO2: 24 mmol/L (ref 22–32)
CREATININE: 1.04 mg/dL — AB (ref 0.44–1.00)
Calcium: 9.1 mg/dL (ref 8.9–10.3)
GFR, EST AFRICAN AMERICAN: 59 mL/min — AB (ref >60)
GFR, EST NON AFRICAN AMERICAN: 50 mL/min — AB (ref >60)
Glucose, Bld: 131 mg/dL — ABNORMAL HIGH (ref 65–99)
Potassium: 4.2 mmol/L (ref 3.5–5.1)
SODIUM: 137 mmol/L (ref 135–145)

## 2016-12-30 LAB — I-STAT TROPONIN, ED: Troponin i, poc: 0.01 ng/mL (ref 0.00–0.08)

## 2016-12-30 LAB — CBC
HCT: 36.1 % (ref 36.0–46.0)
Hemoglobin: 11 g/dL — ABNORMAL LOW (ref 12.0–15.0)
MCH: 27.5 pg (ref 26.0–34.0)
MCHC: 30.5 g/dL (ref 30.0–36.0)
MCV: 90.3 fL (ref 78.0–100.0)
PLATELETS: 307 10*3/uL (ref 150–400)
RBC: 4 MIL/uL (ref 3.87–5.11)
RDW: 16.4 % — AB (ref 11.5–15.5)
WBC: 6.1 10*3/uL (ref 4.0–10.5)

## 2016-12-30 LAB — D-DIMER, QUANTITATIVE: D-Dimer, Quant: 0.72 ug/mL-FEU — ABNORMAL HIGH (ref 0.00–0.50)

## 2016-12-30 LAB — BRAIN NATRIURETIC PEPTIDE: B Natriuretic Peptide: 481.7 pg/mL — ABNORMAL HIGH (ref 0.0–100.0)

## 2016-12-30 MED ORDER — FUROSEMIDE 10 MG/ML IJ SOLN
20.0000 mg | Freq: Two times a day (BID) | INTRAMUSCULAR | Status: DC
  Administered 2016-12-30 – 2016-12-31 (×2): 20 mg via INTRAVENOUS
  Filled 2016-12-30 (×2): qty 2

## 2016-12-30 MED ORDER — PROPRANOLOL HCL ER 120 MG PO CP24
120.0000 mg | ORAL_CAPSULE | Freq: Every day | ORAL | Status: DC
  Administered 2016-12-31: 120 mg via ORAL
  Filled 2016-12-30 (×2): qty 1

## 2016-12-30 MED ORDER — METHYLPREDNISOLONE SODIUM SUCC 125 MG IJ SOLR
125.0000 mg | Freq: Once | INTRAMUSCULAR | Status: AC
  Administered 2016-12-30: 125 mg via INTRAVENOUS
  Filled 2016-12-30: qty 2

## 2016-12-30 MED ORDER — PANTOPRAZOLE SODIUM 40 MG PO TBEC
40.0000 mg | DELAYED_RELEASE_TABLET | Freq: Every day | ORAL | Status: DC
  Administered 2016-12-30 – 2017-01-01 (×3): 40 mg via ORAL
  Filled 2016-12-30 (×4): qty 1

## 2016-12-30 MED ORDER — ONDANSETRON HCL 4 MG/2ML IJ SOLN: 4.0000 mg | Freq: Four times a day (QID) | INTRAMUSCULAR | Status: DC | PRN

## 2016-12-30 MED ORDER — PROPRANOLOL HCL ER 60 MG PO CP24
60.0000 mg | ORAL_CAPSULE | Freq: Every day | ORAL | Status: DC
  Administered 2016-12-30 – 2016-12-31 (×2): 60 mg via ORAL
  Filled 2016-12-30 (×2): qty 1

## 2016-12-30 MED ORDER — DOXYCYCLINE HYCLATE 100 MG PO TABS
100.0000 mg | ORAL_TABLET | Freq: Two times a day (BID) | ORAL | Status: DC
  Administered 2016-12-31 – 2017-01-01 (×3): 100 mg via ORAL
  Filled 2016-12-30 (×3): qty 1

## 2016-12-30 MED ORDER — FUROSEMIDE 10 MG/ML IJ SOLN
80.0000 mg | Freq: Once | INTRAMUSCULAR | Status: AC
  Administered 2016-12-30: 80 mg via INTRAVENOUS
  Filled 2016-12-30: qty 8

## 2016-12-30 MED ORDER — SODIUM CHLORIDE 0.9% FLUSH
3.0000 mL | Freq: Two times a day (BID) | INTRAVENOUS | Status: DC
  Administered 2016-12-30 – 2017-01-01 (×4): 3 mL via INTRAVENOUS

## 2016-12-30 MED ORDER — ATORVASTATIN CALCIUM 10 MG PO TABS
10.0000 mg | ORAL_TABLET | Freq: Every day | ORAL | Status: DC
  Administered 2016-12-30 – 2016-12-31 (×2): 10 mg via ORAL
  Filled 2016-12-30 (×3): qty 1

## 2016-12-30 MED ORDER — PROPRANOLOL HCL ER 60 MG PO CP24
60.0000 mg | ORAL_CAPSULE | ORAL | Status: DC
  Administered 2016-12-31: 60 mg via ORAL
  Filled 2016-12-30: qty 1

## 2016-12-30 MED ORDER — IPRATROPIUM-ALBUTEROL 0.5-2.5 (3) MG/3ML IN SOLN
3.0000 mL | RESPIRATORY_TRACT | Status: AC
  Administered 2016-12-30 (×3): 3 mL via RESPIRATORY_TRACT
  Filled 2016-12-30: qty 9

## 2016-12-30 MED ORDER — APIXABAN 5 MG PO TABS
5.0000 mg | ORAL_TABLET | Freq: Two times a day (BID) | ORAL | Status: DC
  Administered 2016-12-30 – 2017-01-01 (×4): 5 mg via ORAL
  Filled 2016-12-30 (×5): qty 1

## 2016-12-30 MED ORDER — ONDANSETRON HCL 4 MG PO TABS: 4.0000 mg | ORAL_TABLET | Freq: Four times a day (QID) | ORAL | Status: DC | PRN

## 2016-12-30 MED ORDER — IPRATROPIUM-ALBUTEROL 0.5-2.5 (3) MG/3ML IN SOLN
3.0000 mL | Freq: Three times a day (TID) | RESPIRATORY_TRACT | Status: DC
  Administered 2016-12-31 (×3): 3 mL via RESPIRATORY_TRACT
  Filled 2016-12-30 (×3): qty 3

## 2016-12-30 MED ORDER — IPRATROPIUM-ALBUTEROL 0.5-2.5 (3) MG/3ML IN SOLN
3.0000 mL | RESPIRATORY_TRACT | Status: DC
  Administered 2016-12-30 (×2): 3 mL via RESPIRATORY_TRACT
  Filled 2016-12-30 (×2): qty 3

## 2016-12-30 MED ORDER — ACETAMINOPHEN 650 MG RE SUPP: 650.0000 mg | Freq: Four times a day (QID) | RECTAL | Status: DC | PRN

## 2016-12-30 MED ORDER — DEXTROSE 5 % IV SOLN
500.0000 mg | Freq: Once | INTRAVENOUS | Status: AC
  Administered 2016-12-30: 500 mg via INTRAVENOUS
  Filled 2016-12-30: qty 500

## 2016-12-30 MED ORDER — METHYLPREDNISOLONE SODIUM SUCC 125 MG IJ SOLR
60.0000 mg | Freq: Two times a day (BID) | INTRAMUSCULAR | Status: DC
  Administered 2016-12-30 – 2016-12-31 (×2): 60 mg via INTRAVENOUS
  Filled 2016-12-30 (×2): qty 2

## 2016-12-30 MED ORDER — ACETAMINOPHEN 325 MG PO TABS
650.0000 mg | ORAL_TABLET | Freq: Four times a day (QID) | ORAL | Status: DC | PRN
  Administered 2016-12-30: 650 mg via ORAL
  Filled 2016-12-30: qty 2

## 2016-12-30 MED ORDER — SODIUM CHLORIDE 0.9% FLUSH: 3.0000 mL | INTRAVENOUS | Status: DC | PRN

## 2016-12-30 MED ORDER — DEXTROSE 5 % IV SOLN
1.0000 g | Freq: Once | INTRAVENOUS | Status: AC
  Administered 2016-12-30: 1 g via INTRAVENOUS
  Filled 2016-12-30: qty 10

## 2016-12-30 MED ORDER — PROPRANOLOL HCL ER 60 MG PO CP24: 60.0000 mg | ORAL_CAPSULE | Freq: Two times a day (BID) | ORAL | Status: DC

## 2016-12-30 MED ORDER — AMLODIPINE BESYLATE 5 MG PO TABS
5.0000 mg | ORAL_TABLET | Freq: Every day | ORAL | Status: DC
  Administered 2016-12-30 – 2017-01-01 (×3): 5 mg via ORAL
  Filled 2016-12-30 (×3): qty 1

## 2016-12-30 MED ORDER — GUAIFENESIN ER 600 MG PO TB12
1200.0000 mg | ORAL_TABLET | Freq: Two times a day (BID) | ORAL | Status: DC
  Administered 2016-12-30 – 2017-01-01 (×4): 1200 mg via ORAL
  Filled 2016-12-30 (×5): qty 2

## 2016-12-30 MED ORDER — SODIUM CHLORIDE 0.9 % IV SOLN: 250.0000 mL | INTRAVENOUS | Status: DC | PRN

## 2016-12-30 MED ORDER — TIOTROPIUM BROMIDE MONOHYDRATE 18 MCG IN CAPS
1.0000 | ORAL_CAPSULE | Freq: Every day | RESPIRATORY_TRACT | Status: DC
  Administered 2017-01-01: 18 ug via RESPIRATORY_TRACT
  Filled 2016-12-30: qty 5

## 2016-12-30 MED ORDER — LEVOTHYROXINE SODIUM 75 MCG PO TABS
175.0000 ug | ORAL_TABLET | Freq: Every day | ORAL | Status: DC
  Administered 2016-12-31 – 2017-01-01 (×2): 175 ug via ORAL
  Filled 2016-12-30 (×2): qty 1

## 2016-12-30 MED ORDER — LOSARTAN POTASSIUM 50 MG PO TABS
100.0000 mg | ORAL_TABLET | Freq: Every day | ORAL | Status: DC
  Administered 2016-12-30 – 2017-01-01 (×3): 100 mg via ORAL
  Filled 2016-12-30 (×3): qty 2

## 2016-12-30 NOTE — H&P (Signed)
History and Physical    Kristin Hood EUM:[id number] DOB: 1941-01-07 DOA: 12/30/2016  PCP: [name], MD Patient coming from: home  Chief Complaint: sob  HPI: Kristin Hood is a 76 y.o. female with medical history significant of A. fib, rest cancer, essential tremor, COPD, CVA, GERD, varicose veins, thyroid nodules, hypertension, hyperlipidemia.  Patient presents with one-month history of gradual worsening shortness of breath with marked worsening since 12/27/2016. Patient states this timing coincides with a return from her vacation. Patient reports some improvement with albuterol that was limited. Mucinex with some improvement as well and being able to cough up phlegm. Patient does not wear oxygen at home. Patient does report increasing productive cough but no fevers, chest pain or palpitations. Patient has wheezing as well over the last 1 day. Shortness of breath is significantly worse with ambulation. Denies any worsening lower extremity edema. Patient feels improved in the ED after diuresis, steroids and breathing treatments.     ED Course: Lasix 80 mg, cimetidine 125, ceftriaxone, azithromycin administered in the ED. Improvement in overall breast were status with patient continues to require O2.   Review of Systems: As per HPI otherwise all other systems reviewed and are negative  Ambulatory Status: Limited due to body habitus and advancing COPD.   Past Medical History:  Diagnosis Date  . Anginal pain ([place])   . Arthritis   . Atrial fibrillation ([place]) 07/2012   Eliquis, rate controlled  . Breast cancer ([place]) 2001   left mastectomy  . Coarse tremors    , Essential  . Complication of anesthesia    SMALLER TUBE FOR INTUBATION AS GAGS ON TUBE  . COPD (chronic obstructive pulmonary disease) ([place])   . CVA (cerebral vascular accident) ([place]) 2013  . Dysrhythmia 08/20/2012   Atrial Fibrillation  . Family history of adverse reaction to anesthesia    mother experienced pain  with intubation   . GERD (gastroesophageal reflux disease)    history of   . Hard of hearing   . Heart murmur   . History of chemotherapy 11/13/1998 to 2010   Adriamycin/Docotaxol, Dexorubicin/Taxotere, Femara, Tamoxifen  . History of hiatal hernia    history of   . Hx of adenomatous colonic polyps 2006   , Benign  . Hypercholesteremia   . Hypertension    , With mild concentric left ventricular hypertrophy  . Mixed dyslipidemia   . Shingles   . Shortness of breath dyspnea    on exertion   . Spider veins   . Stroke ([place]) 11/17/2011   left side brain-speech-TPA  . Thyroid nodule   . Tremor, essential   . Valvular sclerosis 09/23/2003   without stenosis  . Varicose veins     Past Surgical History:  Procedure Laterality Date  . APPENDECTOMY  07/10/1957  . BIOPSY THYROID  12/01/2013   ultrasound  . BREAST SURGERY    . BUNIONECTOMY  11/29/1988   bilateral with hammer toes  . CARDIAC CATHETERIZATION  07/2010   , Without significant CAD  . CARDIAC CATHETERIZATION  07/2011   [name]  . CESAREAN SECTION    . CESAREAN SECTION  05/1968, 07/1965  . EYE SURGERY  01/31/2013   eye lid; cataract surgery bilat   . JOINT REPLACEMENT  05/2001   left knee  . JOINT REPLACEMENT  11/2001   right knee  . JOINT REPLACEMENT  01/2008   redo right knee  . KNEE SURGERY  2009   ,Total knee replacement  . LEFT  HEART CATHETERIZATION WITH CORONARY ANGIOGRAM N/A 08/14/2011   Procedure: LEFT HEART CATHETERIZATION WITH CORONARY ANGIOGRAM;  Surgeon: [name], MD;  Location: [facility] CATH LAB;  Service: Cardiovascular;  Laterality: N/A;  . Left Rotator Cuff    . MASTECTOMY  10/19/1998   Radical, left -with lymph nodes (8 total)  . Reverse bunionectomy  08/29/1993   removal of Tibial Sesamoid-left  . TEE WITHOUT CARDIOVERSION  11/19/2011   Procedure: TRANSESOPHAGEAL ECHOCARDIOGRAM (TEE);  Surgeon: [name], MD;  Location: [facility] ENDOSCOPY;  Service: Cardiovascular;  Laterality: N/A;  .  THYROIDECTOMY N/A 03/02/2015   Procedure: TOTAL THYROIDECTOMY;  Surgeon: Armandina Gemma, MD;  Location: [facility] ORS;  Service: General;  Laterality: N/A;  . TONSILLECTOMY    . TOTAL HIP ARTHROPLASTY Left 10/11/2014   Procedure: LEFT TOTAL HIP ARTHROPLASTY ANTERIOR APPROACH;  Surgeon: [name], MD;  Location: [facility] ORS;  Service: Orthopedics;  Laterality: Left;    Social History   Social History  . Marital status: Married    Spouse name: N/A  . Number of children: N/A  . Years of education: N/A   Occupational History  . retired    Social History Main Topics  . Smoking status: Former Smoker    Packs/day: 2.00    Years: 30.00    Types: Cigarettes    Quit date: 01/30/1992  . Smokeless tobacco: Never Used  . Alcohol use 0.6 oz/week    1 Standard drinks or equivalent per week     Comment: wine occassionally  . Drug use: No  . Sexual activity: Not on file   Other Topics Concern  . Not on file   Social History Narrative  . No narrative on file    Allergies  Allergen Reactions  . Floxin [Ofloxacin] Nausea Only and Other (See Comments)    Dizziness, Vertigo Dizziness, Vertigo    Family History  Problem Relation Age of Onset  . Heart disease Mother   . Aortic stenosis Mother   . Heart disease Father   . Heart failure Father   . Heart attack Father       Prior to Admission medications   Medication Sig Start Date End Date Taking? Authorizing Provider  albuterol (PROVENTIL HFA;VENTOLIN HFA) 108 (90 Base) MCG/ACT inhaler Inhale 2 puffs into the lungs every 4 (four) hours as needed for wheezing or shortness of breath. 01/09/16  Yes Collene Gobble, MD  amLODipine (NORVASC) 5 MG tablet Take 5 mg by mouth daily.   Yes [provider]  apixaban (ELIQUIS) 5 MG TABS tablet Take 1 tablet (5 mg total) by mouth 2 (two) times daily. 12/03/16  Yes Jerline Pain, MD  atorvastatin (LIPITOR) 10 MG tablet Take 10 mg by mouth daily after supper.    Yes [provider]    levothyroxine (SYNTHROID, LEVOTHROID) 175 MCG tablet TAKE 1 TABLET BY MOUTH DAILY BEFORE BREAKFAST. 10/07/16  Yes Philemon Kingdom, MD  propranolol ER (INDERAL LA) 60 MG 24 hr capsule Take 2 tablets by mouth in the a.m, 1 tablet by mouth in the afternoon, and 1 tablet by mouth at night. Patient taking differently: Take 60 mg by mouth 2 (two) times daily.  07/01/16  Yes Isaiah Serge, NP  amoxicillin (AMOXIL) 500 MG capsule Take 500 mg by mouth as directed. Before dental appointments    [provider]  fenofibrate 160 MG tablet Take 160 mg by mouth daily after supper.     [provider]  ferrous sulfate 325 (65 FE) MG EC  tablet Take 325 mg by mouth daily after supper.     [provider]  fluticasone (FLONASE) 50 MCG/ACT nasal spray Place 2 sprays into both nostrils daily. Patient taking differently: Place 2 sprays into both nostrils daily. As needed. 06/05/16   Collene Gobble, MD  furosemide (LASIX) 20 MG tablet Take 1 tablet (20 mg total) by mouth daily. 06/30/16   Isaiah Serge, NP  ketoconazole (NIZORAL) 2 % cream Reported on 05/02/2015 01/24/15   [provider]  losartan (COZAAR) 100 MG tablet Take 100 mg by mouth daily.    [provider]  Multiple Vitamin (MULTIVITAMIN WITH MINERALS) TABS tablet Take 1 tablet by mouth daily.    [provider]  omeprazole (PRILOSEC) 20 MG capsule Take 1 capsule (20 mg total) by mouth 2 (two) times daily. Patient taking differently: Take 20 mg by mouth daily.  06/05/16   Collene Gobble, MD  Pseudoephedrine-Guaifenesin [facility] D PO) Take by mouth.    [provider]  Tiotropium Bromide Monohydrate (SPIRIVA RESPIMAT) 2.5 MCG/ACT AERS Inhale 2 puffs into the lungs daily. 12/10/16   Collene Gobble, MD    Physical Exam: Vitals:   12/30/16 1245 12/30/16 1300 12/30/16 1315 12/30/16 1529  BP: 131/85 126/74 (!) 146/61 (!) 127/93  Pulse: 83 86 86 85  Resp: (!) 22 (!) 22 19 (!) 21  Temp:       TempSrc:      SpO2: (!) 86% 91% 90% 93%  Weight:      Height:         General: Appears to be in mild distress, sitting up in bed. Eyes:  PERRL, EOMI, normal lids, iris ENT:  grossly normal hearing, lips & tongue, mmm Neck:  no LAD, masses or thyromegaly Cardiovascular: Faint heart sounds, trace bilateral lower extremity edema.  Respiratory: Increased effort. Staccato speech. On 2 L nasal cannula. Crackles in the bases bilaterally. No wheezes. Diminished breath sounds throughout  Abdomen:  soft, ntnd, NABS Skin:  no rash or induration seen on limited exam Musculoskeletal:  grossly normal tone BUE/BLE, good ROM, no bony abnormality Psychiatric:  grossly normal mood and affect, speech fluent and appropriate, AOx3 Neurologic:  CN 2-12 grossly intact, moves all extremities in coordinated fashion, sensation intact  Labs on Admission: I have personally reviewed following labs and imaging studies  CBC:  Recent Labs Lab 12/30/16 0919  WBC 6.1  HGB 11.0*  HCT 36.1  MCV 90.3  PLT 888   Basic Metabolic Panel:  Recent Labs Lab 12/30/16 0919  NA 137  K 4.2  CL 103  CO2 24  GLUCOSE 131*  BUN 21*  CREATININE 1.04*  CALCIUM 9.1   GFR: Estimated Creatinine Clearance: 56.6 mL/min (A) (by C-G formula based on SCr of 1.04 mg/dL (H)). Liver Function Tests: No results for input(s): AST, ALT, ALKPHOS, BILITOT, PROT, ALBUMIN in the last 168 hours. No results for input(s): LIPASE, AMYLASE in the last 168 hours. No results for input(s): AMMONIA in the last 168 hours. Coagulation Profile: No results for input(s): INR, PROTIME in the last 168 hours. Cardiac Enzymes: No results for input(s): CKTOTAL, CKMB, CKMBINDEX, TROPONINI in the last 168 hours. BNP (last 3 results) No results for input(s): PROBNP in the last 8760 hours. HbA1C: No results for input(s): HGBA1C in the last 72 hours. CBG: No results for input(s): GLUCAP in the last 168 hours. Lipid Profile: No results for  input(s): CHOL, HDL, LDLCALC, TRIG, CHOLHDL, LDLDIRECT in the last 72 hours.  Thyroid Function Tests: No results for input(s): TSH, T4TOTAL, FREET4, T3FREE, THYROIDAB in the last 72 hours. Anemia Panel: No results for input(s): VITAMINB12, FOLATE, FERRITIN, TIBC, IRON, RETICCTPCT in the last 72 hours. Urine analysis:    Component Value Date/Time   COLORURINE YELLOW 10/04/2014 1457   APPEARANCEUR CLOUDY (A) 10/04/2014 1457   LABSPEC 1.025 10/04/2014 1457   PHURINE 6.5 10/04/2014 1457   GLUCOSEU NEGATIVE 10/04/2014 1457   HGBUR NEGATIVE 10/04/2014 1457   BILIRUBINUR NEGATIVE 10/04/2014 [place] 10/04/2014 1457   PROTEINUR NEGATIVE 10/04/2014 1457   UROBILINOGEN 1.0 10/04/2014 1457   NITRITE NEGATIVE 10/04/2014 1457   LEUKOCYTESUR MODERATE (A) 10/04/2014 1457    Creatinine Clearance: Estimated Creatinine Clearance: 56.6 mL/min (A) (by C-G formula based on SCr of 1.04 mg/dL (H)).  Sepsis Labs: @LABRCNTIP (procalcitonin:4,lacticidven:4) )No results found for this or any previous visit (from the past 240 hour(s)).   Radiological Exams on Admission: Dg Chest 2 View  Result Date: 12/30/2016 CLINICAL DATA:  Sharp chest pain. History of lymphadenopathy and breast cancer EXAM: CHEST  2 VIEW COMPARISON:  Chest CT 07/15/2016, chest radiograph 06/09/2016 FINDINGS: Stable enlarged cardiac silhouette. Calcified hilar lymph nodes again noted. Chronic perihilar bronchitic markings. No overt pulmonary edema. No pneumothorax. No pulmonary edema. Small bilateral pleural effusions seen on lateral projection IMPRESSION: 1. Small bilateral effusions. 2. Chronic parenchymal perihilar lung changes similar to comparison exam. 3. Stable calcified hilar lymph nodes. Electronically Signed   By: Suzy Bouchard M.D.   On: 12/30/2016 09:58    EKG: Independently reviewed. Afib. No ACS  Assessment/Plan Active Problems:   Essential hypertension   Atrial fibrillation (HCC)   Mixed  hyperlipidemia   Hyperlipidemia   Acute respiratory failure with hypoxia (HCC)   Acute diastolic CHF (congestive heart failure) (HCC)   COPD exacerbation (HCC)   Acute respiratory failure: Patient requiring 2-4 L by nasal cannula in order to maintain O2 saturations above 90%. Likely secondary to combination of decompensated CHF and COPD exacerbation. - Wean O2 as able - Treatment plans for COPD and CHF as below  Diastolic congestive heart failure exacerbation: Requiring O2, compliant with home Lasix, BNP 481, chest x-ray as above. Improving after administration of Lasix. Home dose of Lasix 20 mg daily. Lasix 80 mg IV 1 given in the ED with brisk diuresis - Lasix 20mg  IV BID - Strict I/O, Daily wts - CHF orderset  COPD exacerbation: progressive disease. Anticipate pt will need home O2 in the near future.  - ambulatory O2 prior to DC - Solumedrol 60 Q12, DuoNebm Q4,  - Doxycycline (DC ceftriaxone and azithromycin) - Wean O2 as able - Mucinex - Continue spiriva  Afib: rate controlled - continue propranolol, Eliquis  HTN: - contineu propranolol, losartan, Norvasc  Hypothyroidl; - continue synthroid  GERD: - continue ppi  HLD: - continue statin  DVT prophylaxis: Eliquis  Code Status: full  Family Communication: husband  Disposition Plan: pending improvement and return to nml respiratory status  Consults called: none  Admission status: observation    Waldemar Dickens MD [facility]  If 7PM-7AM, please contact night-coverage [email or web address] Password [email or web address]  12/30/2016, 4:28 PM

## 2016-12-30 NOTE — ED Notes (Signed)
Pt states she urinated once in the restroom

## 2016-12-30 NOTE — ED Notes (Signed)
Pt using female urinal

## 2016-12-30 NOTE — ED Provider Notes (Signed)
Emergency Department Provider Note   I have reviewed the triage vital signs and the nursing notes.   HISTORY  Chief Complaint Shortness of Breath and Chest Pain   HPI Kristin Hood is a 76 y.o. 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 She states that this is been progressively worsening for a while. She is not taking anything at home to try to help it. She's not had a fever but does have a productive cough that seems to be clear sputum. No leg swelling. No rashes. Similar to previous COPD exacerbations.   Past Medical History:  Diagnosis Date  . Anginal pain ([place])   . Arthritis   . Atrial fibrillation ([place]) 07/2012   Eliquis, rate controlled  . Breast cancer ([place]) 2001   left mastectomy  . Coarse tremors    , Essential  . Complication of anesthesia    SMALLER TUBE FOR INTUBATION AS GAGS ON TUBE  . COPD (chronic obstructive pulmonary disease) ([place])   . CVA (cerebral vascular accident) ([place]) 2013  . Dysrhythmia 08/20/2012   Atrial Fibrillation  . Family history of adverse reaction to anesthesia    mother experienced pain with intubation   . GERD (gastroesophageal reflux disease)    history of   . Hard of hearing   . Heart murmur   . History of chemotherapy 11/13/1998 to 2010   Adriamycin/Docotaxol, Dexorubicin/Taxotere, Femara, Tamoxifen  . History of hiatal hernia    history of   . Hx of adenomatous colonic polyps 2006   , Benign  . Hypercholesteremia   . Hypertension    , With mild concentric left ventricular hypertrophy  . Mixed dyslipidemia   . Shingles   . Shortness of breath dyspnea    on exertion   . Spider veins   . Stroke ([place]) 11/17/2011   left side brain-speech-TPA  . Thyroid nodule   . Tremor, essential   . Valvular sclerosis 09/23/2003   without stenosis  . Varicose veins     Patient  Active Problem List   Diagnosis Date Noted  . Acute respiratory failure with hypoxia ([place]) 12/30/2016  . Acute diastolic CHF (congestive heart failure) ([place]) 12/30/2016  . COPD exacerbation ([place]) 12/30/2016  . Allergic rhinitis 12/09/2016  . ARF (acute renal failure) ([place]) 06/11/2016  . CHF (congestive heart failure) ([place]) 06/10/2016  . Acute respiratory failure with hypoxemia ([place]) 06/09/2016  . Pulmonary nodules   . Acute congestive heart failure ([place])   . Hoarseness 06/04/2015  . 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 COPD (chronic obstructive pulmonary disease) ([place]) 12/28/2014  . Abnormal chest CT 12/07/2014  . Chronic atrial fibrillation ([place]) 11/16/2014  . SOB (shortness of breath) 11/16/2014  . Hypertension 11/16/2014  . OA (osteoarthritis) of hip 10/11/2014  . Lymphadenopathy 07/17/2014  . Hyperlipidemia 02/13/2014  . Chronic diastolic heart failure ([place]) 02/13/2014  . Impaired fasting glucose 12/25/2012  . Mixed hyperlipidemia 12/25/2012  . Pure hypercholesterolemia 12/25/2012  . Benign hypertensive heart disease without heart failure 12/25/2012  . Phlebitis and thrombophlebitis of superficial vessels of lower extremities 12/25/2012  . Abnormal involuntary movements(781.0) 12/25/2012  . Long term current use of anticoagulant therapy 12/25/2012  . Chest pain 12/11/2012  . Atrial fibrillation ([place]) 12/11/2012  . History of CVA (cerebrovascular accident) 12/11/2012  .  Morbid obesity ([place]) 06/18/2012  . Other and unspecified hyperlipidemia 06/18/2012  . Essential hypertension 06/18/2012  . Essential and other specified forms of tremor 06/18/2012  . Stroke ([place]) 11/19/2011  . Unspecified cerebral artery occlusion with cerebral infarction 11/18/2011  . Morbid obesity with BMI of 45.0-49.9, adult ([place]) 11/18/2011    Past Surgical History:  Procedure Laterality Date  . APPENDECTOMY  07/10/1957  . BIOPSY  THYROID  12/01/2013   ultrasound  . BREAST SURGERY    . BUNIONECTOMY  11/29/1988   bilateral with hammer toes  . CARDIAC CATHETERIZATION  07/2010   , Without significant CAD  . CARDIAC CATHETERIZATION  07/2011   [name]  . CESAREAN SECTION    . CESAREAN SECTION  05/1968, 07/1965  . EYE SURGERY  01/31/2013   eye lid; cataract surgery bilat   . JOINT REPLACEMENT  05/2001   left knee  . JOINT REPLACEMENT  11/2001   right knee  . JOINT REPLACEMENT  01/2008   redo right knee  . KNEE SURGERY  2009   ,Total knee replacement  . LEFT HEART CATHETERIZATION WITH CORONARY ANGIOGRAM N/A 08/14/2011   Procedure: LEFT HEART CATHETERIZATION WITH CORONARY ANGIOGRAM;  Surgeon: [name], MD;  Location[facility] C[facility] LAB;  Service: Cardiovascular;  Laterality: N/A;  . Left Rotator Cuff    . MASTECTOMY  10/19/1998   Radical, left -with lymph nodes (8 total)  . Reverse bunionectomy  08/29/1993   removal of Tibial Sesamoid-left  . TEE WITHOUT CARDIOVERSION  11/19/2011   Procedure: TRANSESOPHAGEAL ECHOCARDIOGRAM (TEE);  Surgeon: [name], MD;  Location[facility] ENDOSCOPY;  Service: Cardiovascular;  Laterality: N/A;  . THYROIDECTOMY N/A 03/02/2015   Procedure: TOTAL THYROIDECTOMY;  Surgeon: Armandina Gemma, MD;  Location: [facility] ORS;  Service: General;  Laterality: N/A;  . TONSILLECTOMY    . TOTAL HIP ARTHROPLASTY Left 10/11/2014   Procedure: LEFT TOTAL HIP ARTHROPLASTY ANTERIOR APPROACH;  Surgeon: [name], MD;  Location: [facility] ORS;  Service: Orthopedics;  Laterality: Left;    Current Outpatient Rx  . Order #: [id number] Class: Normal  . Order #: [id number] Class: Historical Med  . Order #: [id number] Class: Print  . Order #: [id number] Class: Historical Med  . Order #: [id number] Class: Historical Med  . Order #: [id number] Class: Historical Med  . Order #: [id number] Class: Normal  . Order #: [id number] Class: Normal  . Order #: [id number] Class: Historical Med  . Order #: [id number] Class: Normal  . Order #: [id number] Class:  Historical Med  . Order #: [id number] Class: Historical Med  . Order #: [id number] Class: Normal  . Order #: [id number] Class: Normal  . Order #: [id number] Class: Historical Med  . Order #: [id number] Class: Print    Allergies Floxin [ofloxacin]  Family History  Problem Relation Age of Onset  . Heart disease Mother   . Aortic stenosis Mother   . Heart disease Father   . Heart failure Father   . Heart attack Father     Social History Social History  Substance Use Topics  . Smoking status: Former Smoker    Packs/day: 2.00    Years: 30.00    Types: Cigarettes    Quit date: 01/30/1992  . Smokeless tobacco: Never Used  . Alcohol use 0.6 oz/week    1 Standard drinks or equivalent per week     Comment: wine occassionally    Review of Systems  All other systems negative except as documented in the HPI. All pertinent positives and negatives as reviewed in the HPI. ____________________________________________   PHYSICAL EXAM:  VITAL SIGNS:  ED Triage Vitals  Enc Vitals Group     BP 12/30/16 0915 140/89     Pulse Rate 12/30/16 0913 95     Resp 12/30/16 0913 20     Temp 12/30/16 0913 98.1 F (36.7 C)     Temp Source 12/30/16 0913 Oral     SpO2 12/30/16 0913 91 %     Weight 12/30/16 0914 240 lb (108.9 kg)     Height 12/30/16 0914 5\' 6"  (1.676 m)     Head Circumference --      Peak Flow --      Pain Score 12/30/16 0912 5     Pain Loc --      Pain Edu? --      Excl. in [place]? --     Constitutional: Alert and oriented. Well appearing and in no acute distress. Eyes: Conjunctivae are normal. PERRL. EOMI. Head: Atraumatic. Nose: No congestion/rhinnorhea. Mouth/Throat: Mucous membranes are moist.  Oropharynx non-erythematous. Neck: No stridor.  No meningeal signs.   Cardiovascular: Normal rate, irregular rhythm. Good peripheral circulation. Grossly normal heart sounds.   Respiratory: Normal respiratory effort.  No retractions. Significantly diminished lungs with  wheezing. Gastrointestinal: Soft and nontender. No distention.  Musculoskeletal: No lower extremity tenderness nor edema. No gross deformities of extremities. Neurologic:  Normal speech and language. No gross focal neurologic deficits are appreciated.  Skin:  Skin is warm, dry and intact. No rash noted.  ____________________________________________   LABS (all labs ordered are listed, but only abnormal results are displayed)  Labs Reviewed  BASIC METABOLIC PANEL - Abnormal; Notable for the following:       Result Value   Glucose, Bld 131 (*)    BUN 21 (*)    Creatinine, Ser 1.04 (*)    GFR calc non Af Amer 50 (*)    GFR calc Af Amer 59 (*)    All other components within normal limits  CBC - Abnormal; Notable for the following:    Hemoglobin 11.0 (*)    RDW 16.4 (*)    All other components within normal limits  D-DIMER, QUANTITATIVE (NOT AT [facility]) - Abnormal; Notable for the following:    D-Dimer, Quant 0.72 (*)    All other components within normal limits  BRAIN NATRIURETIC PEPTIDE - Abnormal; Notable for the following:    B Natriuretic Peptide 481.7 (*)    All other components within normal limits  I-STAT TROPONIN, ED   ____________________________________________  EKG   EKG Interpretation  Date/Time:  Tuesday December 30 2016 09:18:05 EDT Ventricular Rate:  100 PR Interval:    QRS Duration: 80 QT Interval:  348 QTC Calculation: 448 R Axis:   -23 Text Interpretation:  Atrial fibrillation Low voltage QRS Cannot rule out Anterior infarct , age undetermined Abnormal ECG no obvious changes since 3/13 Confirmed by Merrily Pew [phone]) on 12/30/2016 10:20:03 AM       ____________________________________________  RADIOLOGY  Dg Chest 2 View  Result Date: 12/30/2016 CLINICAL DATA:  Sharp chest pain. History of lymphadenopathy and breast cancer EXAM: CHEST  2 VIEW COMPARISON:  Chest CT 07/15/2016, chest radiograph 06/09/2016 FINDINGS: Stable enlarged cardiac silhouette.  Calcified hilar lymph nodes again noted. Chronic perihilar bronchitic markings. No overt pulmonary edema. No pneumothorax. No pulmonary edema. Small bilateral pleural effusions seen on lateral projection IMPRESSION: 1. Small bilateral effusions. 2. Chronic parenchymal perihilar lung changes similar to comparison exam. 3. Stable calcified hilar lymph nodes. Electronically Signed   By: Helane Gunther.D.  On: 12/30/2016 09:58    ____________________________________________   PROCEDURES  Procedure(s) performed:   Procedures  CRITICAL CARE Performed by: Merrily Pew Total critical care time: 35 minutes Critical care time was exclusive of separately billable procedures and treating other patients. Critical care was necessary to treat or prevent imminent or life-threatening deterioration. Critical care was time spent personally by me on the following activities: development of treatment plan with patient and/or surrogate as well as nursing, discussions with consultants, evaluation of patient's response to treatment, examination of patient, obtaining history from patient or surrogate, ordering and performing treatments and interventions, ordering and review of laboratory studies, ordering and review of radiographic studies, pulse oximetry and re-evaluation of patient's condition.  ____________________________________________   INITIAL IMPRESSION / ASSESSMENT AND PLAN / ED COURSE  Pertinent labs & imaging results that were available during my care of the patient were reviewed by me and considered in my medical decision making (see chart for details).  Patient with likely COPD exacerbation. Also with hypoxia. She may have a component of CHF as well so given Lasix and steroids and multiple breathing treatments with persistent hypoxia tachypnea. Plan for admission for further management and workup.   ____________________________________________  FINAL CLINICAL IMPRESSION(S) / ED  DIAGNOSES  Final diagnoses:  COPD exacerbation ([place])  Acute respiratory failure with hypoxia ([place])     MEDICATIONS GIVEN DURING THIS VISIT:  Medications  methylPREDNISolone sodium succinate (SOLU-MEDROL) 125 mg/2 mL injection 60 mg (not administered)  furosemide (LASIX) injection 20 mg (not administered)  ipratropium-albuterol (DUONEB) 0.5-2.5 (3) MG/3ML nebulizer solution 3 mL (not administered)  Tiotropium Bromide Monohydrate AERS 2 puff (not administered)  atorvastatin (LIPITOR) tablet 10 mg (not administered)  apixaban (ELIQUIS) tablet 5 mg (not administered)  losartan (COZAAR) tablet 100 mg (not administered)  propranolol ER (INDERAL LA) 24 hr capsule 60 mg (not administered)  amLODipine (NORVASC) tablet 5 mg (not administered)  levothyroxine (SYNTHROID, LEVOTHROID) tablet 175 mcg (not administered)  pantoprazole (PROTONIX) EC tablet 40 mg (not administered)  sodium chloride flush (NS) 0.9 % injection 3 mL (not administered)  sodium chloride flush (NS) 0.9 % injection 3 mL (not administered)  0.9 %  sodium chloride infusion (not administered)  acetaminophen (TYLENOL) tablet 650 mg (not administered)    Or  acetaminophen (TYLENOL) suppository 650 mg (not administered)  ondansetron (ZOFRAN) tablet 4 mg (not administered)    Or  ondansetron (ZOFRAN) injection 4 mg (not administered)  doxycycline (VIBRA-TABS) tablet 100 mg (not administered)  guaiFENesin (MUCINEX) 12 hr tablet 1,200 mg (not administered)  ipratropium-albuterol (DUONEB) 0.5-2.5 (3) MG/3ML nebulizer solution 3 mL (3 mLs Nebulization Given 12/30/16 1145)  methylPREDNISolone sodium succinate (SOLU-MEDROL) 125 mg/2 mL injection 125 mg (125 mg Intravenous Given 12/30/16 1135)  cefTRIAXone (ROCEPHIN) 1 g in dextrose 5 % 50 mL IVPB (0 g Intravenous Stopped 12/30/16 1205)  azithromycin (ZITHROMAX) 500 mg in dextrose 5 % 250 mL IVPB (0 mg Intravenous Stopped 12/30/16 1606)  furosemide (LASIX) injection 80 mg (80 mg  Intravenous Given 12/30/16 1359)     NEW OUTPATIENT MEDICATIONS STARTED DURING THIS VISIT:  New Prescriptions   No medications on file    Note:  This document was prepared using Dragon voice recognition software and may include unintentional dictation errors.   Merrily Pew, MD 12/30/16 [phone]

## 2016-12-30 NOTE — ED Notes (Signed)
Attempted report 

## 2016-12-30 NOTE — ED Notes (Signed)
PureWick applied 

## 2016-12-30 NOTE — ED Triage Notes (Signed)
Pt reports worsening SOB over past 2 days, intermittent central CP described as "sharp."  Pt denies fever, chills, cough, n/v.

## 2016-12-31 DIAGNOSIS — J441 Chronic obstructive pulmonary disease with (acute) exacerbation: Secondary | ICD-10-CM | POA: Diagnosis present

## 2016-12-31 DIAGNOSIS — I5031 Acute diastolic (congestive) heart failure: Secondary | ICD-10-CM | POA: Diagnosis not present

## 2016-12-31 DIAGNOSIS — M1991 Primary osteoarthritis, unspecified site: Secondary | ICD-10-CM | POA: Diagnosis present

## 2016-12-31 DIAGNOSIS — K219 Gastro-esophageal reflux disease without esophagitis: Secondary | ICD-10-CM | POA: Diagnosis present

## 2016-12-31 DIAGNOSIS — Z87891 Personal history of nicotine dependence: Secondary | ICD-10-CM | POA: Diagnosis not present

## 2016-12-31 DIAGNOSIS — I10 Essential (primary) hypertension: Secondary | ICD-10-CM | POA: Diagnosis not present

## 2016-12-31 DIAGNOSIS — J9601 Acute respiratory failure with hypoxia: Secondary | ICD-10-CM | POA: Diagnosis present

## 2016-12-31 DIAGNOSIS — Z9111 Patient's noncompliance with dietary regimen: Secondary | ICD-10-CM | POA: Diagnosis not present

## 2016-12-31 DIAGNOSIS — I482 Chronic atrial fibrillation: Secondary | ICD-10-CM

## 2016-12-31 DIAGNOSIS — G252 Other specified forms of tremor: Secondary | ICD-10-CM | POA: Diagnosis present

## 2016-12-31 DIAGNOSIS — I5033 Acute on chronic diastolic (congestive) heart failure: Secondary | ICD-10-CM | POA: Diagnosis present

## 2016-12-31 DIAGNOSIS — E782 Mixed hyperlipidemia: Secondary | ICD-10-CM | POA: Diagnosis present

## 2016-12-31 DIAGNOSIS — Z853 Personal history of malignant neoplasm of breast: Secondary | ICD-10-CM | POA: Diagnosis not present

## 2016-12-31 DIAGNOSIS — Z7901 Long term (current) use of anticoagulants: Secondary | ICD-10-CM | POA: Diagnosis not present

## 2016-12-31 DIAGNOSIS — E039 Hypothyroidism, unspecified: Secondary | ICD-10-CM | POA: Diagnosis present

## 2016-12-31 DIAGNOSIS — I110 Hypertensive heart disease with heart failure: Secondary | ICD-10-CM | POA: Diagnosis present

## 2016-12-31 DIAGNOSIS — Z79899 Other long term (current) drug therapy: Secondary | ICD-10-CM | POA: Diagnosis not present

## 2016-12-31 DIAGNOSIS — Z8673 Personal history of transient ischemic attack (TIA), and cerebral infarction without residual deficits: Secondary | ICD-10-CM | POA: Diagnosis not present

## 2016-12-31 LAB — CBC
HCT: 31.6 % — ABNORMAL LOW (ref 36.0–46.0)
HEMOGLOBIN: 9.9 g/dL — AB (ref 12.0–15.0)
MCH: 28.4 pg (ref 26.0–34.0)
MCHC: 31.3 g/dL (ref 30.0–36.0)
MCV: 90.8 fL (ref 78.0–100.0)
PLATELETS: 242 10*3/uL (ref 150–400)
RBC: 3.48 MIL/uL — ABNORMAL LOW (ref 3.87–5.11)
RDW: 16.5 % — ABNORMAL HIGH (ref 11.5–15.5)
WBC: 3.6 10*3/uL — ABNORMAL LOW (ref 4.0–10.5)

## 2016-12-31 LAB — BASIC METABOLIC PANEL
Anion gap: 10 (ref 5–15)
BUN: 24 mg/dL — AB (ref 6–20)
CHLORIDE: 99 mmol/L — AB (ref 101–111)
CO2: 27 mmol/L (ref 22–32)
CREATININE: 1.13 mg/dL — AB (ref 0.44–1.00)
Calcium: 8.7 mg/dL — ABNORMAL LOW (ref 8.9–10.3)
GFR calc Af Amer: 53 mL/min — ABNORMAL LOW (ref >60)
GFR calc non Af Amer: 46 mL/min — ABNORMAL LOW (ref >60)
GLUCOSE: 168 mg/dL — AB (ref 65–99)
Potassium: 4.2 mmol/L (ref 3.5–5.1)
SODIUM: 136 mmol/L (ref 135–145)

## 2016-12-31 MED ORDER — FUROSEMIDE 10 MG/ML IJ SOLN
40.0000 mg | Freq: Two times a day (BID) | INTRAMUSCULAR | Status: DC
  Administered 2016-12-31 – 2017-01-01 (×2): 40 mg via INTRAVENOUS
  Filled 2016-12-31 (×2): qty 4

## 2016-12-31 MED ORDER — IPRATROPIUM-ALBUTEROL 0.5-2.5 (3) MG/3ML IN SOLN
3.0000 mL | Freq: Two times a day (BID) | RESPIRATORY_TRACT | Status: DC
  Administered 2017-01-01: 3 mL via RESPIRATORY_TRACT
  Filled 2016-12-31: qty 3

## 2016-12-31 MED ORDER — METHYLPREDNISOLONE SODIUM SUCC 40 MG IJ SOLR
40.0000 mg | Freq: Two times a day (BID) | INTRAMUSCULAR | Status: DC
  Administered 2016-12-31 – 2017-01-01 (×2): 40 mg via INTRAVENOUS
  Filled 2016-12-31 (×2): qty 1

## 2016-12-31 NOTE — Progress Notes (Signed)
PROGRESS NOTE        PATIENT DETAILS Name: Kristin Hood Age: 76 y.o. Sex: female Date of Birth: 01/20/1941 Admit Date: 12/30/2016 Admitting Physician Waldemar Dickens, MD [id number], Lennette Bihari, MD  Brief Narrative: Patient is a 76 y.o. female with prior history of COPD, chronic diastolic heart failure-with dietary noncompliance (she partied with high school friends in [place] last week) presented with shortness of breath, felt to be multifactorial with both acute diastolic heart failure and mild COPD exacerbation contributing. Rapidly improving after initiation of IV diuretics-see below for further details  Subjective: Feels much better-her apparent dry weight is supposed to be around 240 pounds, she apparently weight 250 pounds on admission. She does acknowledge spending a few days with her high school friends and [place]-and being noncompliant with diet/salt restrictions.   Assessment/Plan: Acute on chronic diastolic heart failure: Rapidly improving following initiation of diuretics-suspect precipitated by dietary noncompliance. Continue IV diuretics-follow weights/volume status  Probable mild COPD exacerbation: Seems to have essentially resolved-hardly any wheezing this morning-start tapering steroids, continue bronchodilators.  Chronic atrial fibrillation: Rate controlled with propranolol-continue anticoagulation with Eliquis  History of CVA: No focal deficits on exam-on anticoagulation as above  Hypertension: Controlled, continue amlodipine, losartan  Hypothyroidism: Continue with levothyroxine  Dyslipidemia: Continue statin  GERD: Continue PPI  Morning labs/Imaging ordered: yes  DVT Prophylaxis: Full dose anticoagulation with Eliquis  Code Status: Full code   Family Communication: Family member at bedside  Disposition Plan: Remain inpatient-hopefully home in the next day or so  Antimicrobial agents: Anti-infectives    Start      Dose/Rate Route Frequency Ordered Stop   12/31/16 1000  doxycycline (VIBRA-TABS) tablet 100 mg     100 mg Oral Every 12 hours 12/30/16 1628     12/30/16 1100  cefTRIAXone (ROCEPHIN) 1 g in dextrose 5 % 50 mL IVPB     1 g 100 mL/hr over 30 Minutes Intravenous  Once 12/30/16 1051 12/30/16 1205   12/30/16 1100  azithromycin (ZITHROMAX) 500 mg in dextrose 5 % 250 mL IVPB     500 mg 250 mL/hr over 60 Minutes Intravenous  Once 12/30/16 1051 12/30/16 1606      Procedures: None  CONSULTS:  None  Time spent: 25- minutes-Greater than 50% of this time was spent in counseling, explanation of diagnosis, planning of further management, and coordination of care.  MEDICATIONS: Scheduled Meds: . amLODipine  5 mg Oral Daily  . apixaban  5 mg Oral BID  . atorvastatin  10 mg Oral QPC supper  . doxycycline  100 mg Oral Q12H  . furosemide  20 mg Intravenous Q12H  . guaiFENesin  1,200 mg Oral BID  . ipratropium-albuterol  3 mL Nebulization TID  . levothyroxine  175 mcg Oral QAC breakfast  . losartan  100 mg Oral Daily  . methylPREDNISolone (SOLU-MEDROL) injection  60 mg Intravenous Q12H  . pantoprazole  40 mg Oral Daily  . propranolol ER  120 mg Oral Daily  . propranolol ER  60 mg Oral QHS  . propranolol ER  60 mg Oral Q24H  . sodium chloride flush  3 mL Intravenous Q12H  . tiotropium  1 capsule Inhalation Daily   Continuous Infusions: . sodium chloride     PRN Meds:.sodium chloride, acetaminophen **OR** acetaminophen, ondansetron **OR** ondansetron (ZOFRAN) IV, sodium chloride flush   PHYSICAL EXAM: Vital signs:  Vitals:   12/31/16 0456 12/31/16 0923 12/31/16 1217 12/31/16 1354  BP: 118/65  123/65   Pulse: 80  87 84  Resp: 18  20 (!) 22  Temp: 98.6 F (37 C)  98.6 F (37 C)   TempSrc: Oral  Oral   SpO2: 100% 91% 94% 93%  Weight: 110.9 kg (244 lb 8 oz)     Height:       Filed Weights   12/30/16 0914 12/30/16 1721 12/31/16 0456  Weight: 108.9 kg (240 lb) 112.1 kg (247 lb 3.2 oz)  110.9 kg (244 lb 8 oz)   Body mass index is 40.69 kg/m.   General appearance :Awake, alert, not in any distress. Speech Clear Eyes:, pupils equally reactive to light and accomodation,no scleral icterus. HEENT: Atraumatic and Normocephalic Neck: supple, no JVD. No cervical lymphadenopathy.  Resp:Good air entry bilaterally,No rales or rhonchi CVS: S1 S2 irregular, no murmurs.  GI: Bowel sounds present, Non tender and not distended with no gaurding, rigidity or rebound.No organomegaly Extremities: B/L Lower Ext shows + edema, both legs are warm to touch Neurology:  speech clear,Non focal, sensation is grossly intact. Psychiatric: Normal judgment and insight. Alert and oriented x 3. Normal mood. Musculoskeletal:No digital cyanosis Skin:No Rash, warm and dry Wounds:N/A  I have personally reviewed following labs and imaging studies  LABORATORY DATA: CBC:  Recent Labs Lab 12/30/16 0919 12/31/16 0349  WBC 6.1 3.6*  HGB 11.0* 9.9*  HCT 36.1 31.6*  MCV 90.3 90.8  PLT 307 272    Basic Metabolic Panel:  Recent Labs Lab 12/30/16 0919 12/31/16 0349  NA 137 136  K 4.2 4.2  CL 103 99*  CO2 24 27  GLUCOSE 131* 168*  BUN 21* 24*  CREATININE 1.04* 1.13*  CALCIUM 9.1 8.7*    GFR: Estimated Creatinine Clearance: 51.7 mL/min (A) (by C-G formula based on SCr of 1.13 mg/dL (H)).  Liver Function Tests: No results for input(s): AST, ALT, ALKPHOS, BILITOT, PROT, ALBUMIN in the last 168 hours. No results for input(s): LIPASE, AMYLASE in the last 168 hours. No results for input(s): AMMONIA in the last 168 hours.  Coagulation Profile: No results for input(s): INR, PROTIME in the last 168 hours.  Cardiac Enzymes: No results for input(s): CKTOTAL, CKMB, CKMBINDEX, TROPONINI in the last 168 hours.  BNP (last 3 results) No results for input(s): PROBNP in the last 8760 hours.  HbA1C: No results for input(s): HGBA1C in the last 72 hours.  CBG: No results for input(s): GLUCAP in  the last 168 hours.  Lipid Profile: No results for input(s): CHOL, HDL, LDLCALC, TRIG, CHOLHDL, LDLDIRECT in the last 72 hours.  Thyroid Function Tests: No results for input(s): TSH, T4TOTAL, FREET4, T3FREE, THYROIDAB in the last 72 hours.  Anemia Panel: No results for input(s): VITAMINB12, FOLATE, FERRITIN, TIBC, IRON, RETICCTPCT in the last 72 hours.  Urine analysis:    Component Value Date/Time   COLORURINE YELLOW 10/04/2014 1457   APPEARANCEUR CLOUDY (A) 10/04/2014 1457   LABSPEC 1.025 10/04/2014 1457   PHURINE 6.5 10/04/2014 1457   GLUCOSEU NEGATIVE 10/04/2014 1457   HGBUR NEGATIVE 10/04/2014 1457   BILIRUBINUR NEGATIVE 10/04/2014 [place] 10/04/2014 1457   PROTEINUR NEGATIVE 10/04/2014 1457   UROBILINOGEN 1.0 10/04/2014 1457   NITRITE NEGATIVE 10/04/2014 1457   LEUKOCYTESUR MODERATE (A) 10/04/2014 1457    Sepsis Labs: Lactic Acid, Venous No results found for: LATICACIDVEN  MICROBIOLOGY: No results found for this or any previous visit (from the past 240  hour(s)).  RADIOLOGY STUDIES/RESULTS: Dg Chest 2 View  Result Date: 12/30/2016 CLINICAL DATA:  Sharp chest pain. History of lymphadenopathy and breast cancer EXAM: CHEST  2 VIEW COMPARISON:  Chest CT 07/15/2016, chest radiograph 06/09/2016 FINDINGS: Stable enlarged cardiac silhouette. Calcified hilar lymph nodes again noted. Chronic perihilar bronchitic markings. No overt pulmonary edema. No pneumothorax. No pulmonary edema. Small bilateral pleural effusions seen on lateral projection IMPRESSION: 1. Small bilateral effusions. 2. Chronic parenchymal perihilar lung changes similar to comparison exam. 3. Stable calcified hilar lymph nodes. Electronically Signed   By: Suzy Bouchard M.D.   On: 12/30/2016 09:58     LOS: 0 days   Oren Binet, MD  [facility] Pager:[phone]  If 7PM-7AM, please contact night-coverage [email or web address] Password [email or web address] 12/31/2016, 4:32 PM

## 2017-01-01 DIAGNOSIS — E782 Mixed hyperlipidemia: Secondary | ICD-10-CM

## 2017-01-01 LAB — BASIC METABOLIC PANEL
Anion gap: 11 (ref 5–15)
BUN: 36 mg/dL — AB (ref 6–20)
CHLORIDE: 99 mmol/L — AB (ref 101–111)
CO2: 26 mmol/L (ref 22–32)
CREATININE: 1.17 mg/dL — AB (ref 0.44–1.00)
Calcium: 9.1 mg/dL (ref 8.9–10.3)
GFR calc Af Amer: 51 mL/min — ABNORMAL LOW (ref >60)
GFR calc non Af Amer: 44 mL/min — ABNORMAL LOW (ref >60)
Glucose, Bld: 174 mg/dL — ABNORMAL HIGH (ref 65–99)
Potassium: 4 mmol/L (ref 3.5–5.1)
SODIUM: 136 mmol/L (ref 135–145)

## 2017-01-01 MED ORDER — POTASSIUM CHLORIDE ER 10 MEQ PO TBCR: 20.0000 meq | EXTENDED_RELEASE_TABLET | Freq: Every day | ORAL | 0 refills | Status: DC

## 2017-01-01 MED ORDER — DOXYCYCLINE HYCLATE 100 MG PO TABS: 100.0000 mg | ORAL_TABLET | Freq: Two times a day (BID) | ORAL | 0 refills | Status: DC

## 2017-01-01 MED ORDER — PREDNISONE 10 MG PO TABS: ORAL_TABLET | ORAL | 0 refills | Status: DC

## 2017-01-01 MED ORDER — FUROSEMIDE 20 MG PO TABS: 40.0000 mg | ORAL_TABLET | Freq: Every day | ORAL | 0 refills | Status: DC

## 2017-01-01 MED ORDER — PROPRANOLOL HCL 60 MG PO TABS: 60.0000 mg | ORAL_TABLET | Freq: Every day | ORAL | Status: DC

## 2017-01-01 MED ORDER — PROPRANOLOL HCL 60 MG PO TABS
120.0000 mg | ORAL_TABLET | Freq: Every day | ORAL | Status: DC
  Administered 2017-01-01: 120 mg via ORAL
  Filled 2017-01-01: qty 2

## 2017-01-01 MED ORDER — PROPRANOLOL HCL 60 MG PO TABS
60.0000 mg | ORAL_TABLET | Freq: Every day | ORAL | Status: DC
  Filled 2017-01-01: qty 1

## 2017-01-01 NOTE — Consult Note (Signed)
   [facility] CM Inpatient Consult   01/01/2017  Kristin Hood [birth year] [id number]  Patient screened for potential [place] Management services. Patient is in the [place] [facility] services under patient's [employer]age plan. Patient is 76 year old admitted with COPD and diastolic HF exacerbations.  Met with the patient at the bedside to introduce her to the [place] Management programs.  Patient felt there currently there are no needs.  She states she does have 3 Tier 3 meds that are costly.  Encouraged her to reach out to [place] [facility] pharmacist if she has questions.  She uses [facility] [address].  She endorses her primary care provider is Dr. [name] of [facility], this office is listed to provide the transition of care calls and follow up.  Will follow with COPD EMMI calls at this time.  Please place a [facility] Care Management consult or for questions contact:   Natividad Brood, RN BSN [name] Liaison  [phone] business mobile phone Toll free office [phone]

## 2017-01-01 NOTE — Discharge Summary (Signed)
PATIENT DETAILS Name: Kristin Hood Age: 76 y.o. Sex: female Date of Birth: May 05, 1940 MRN: [id number]. Admitting Physician: Waldemar Dickens, MD [id number], Lennette Bihari, MD  Admit Date: 12/30/2016 Discharge date: 01/01/2017  Recommendations for Outpatient Follow-up:  1. Follow up with PCP in 1-2 weeks 2. Please obtain BMP/CBC in one week 3. Please ensure follow-up with cardiology.  Admitted From:  Home  Disposition: [place]: No  Equipment/Devices: None  Discharge Condition: Stable  CODE STATUS: FULL CODE  Diet recommendation:  Heart Healthy   Brief Summary: See H&P, Labs, Consult and Test reports for all details in brief,Patient is a 76 y.o. female with prior history of COPD, chronic diastolic heart failure-with dietary noncompliance (she partied with high school friends in [place] last week) presented with shortness of breath, felt to be multifactorial with both acute diastolic heart failure and mild COPD exacerbation contributing. Rapidly improving after initiation of IV diuretics, and bronchodilator liters.See below for further details  Brief Hospital Course: Acute on chronic diastolic heart failure:  rapidly improved with IV diuretics-decompensation likely secondary to noncompliance to diet (she went to [place] to party with high school friends). Much more compensated today, will switch to oral Lasix-we will have her on 40 mg for a few days before transitioning back to her usual regimen. She needs to follow-up with her primary cardiologist in the next 1-2 weeks. She has been counseled regarding importance of dietary compliance, and the importance of checking her weights daily.   Mild COPD exacerbation: Seems to have essentially resolved-minimal wheezing, transitioned to a steroid taper, continue usual bronchodilators.  Chronic atrial fibrillation: Rate controlled with propranolol-continue anticoagulation with Eliquis  History of CVA: No focal deficits on  exam-on anticoagulation as above  Hypertension: Controlled, continue amlodipine, losartan  Hypothyroidism: Continue with levothyroxine  Dyslipidemia: Continue statin  GERD: Continue PPI  Procedures/Studies: None  Discharge Diagnoses:  Active Problems:   Essential hypertension   Atrial fibrillation (HCC)   Mixed hyperlipidemia   Hyperlipidemia   Acute respiratory failure with hypoxia (HCC)   Acute diastolic CHF (congestive heart failure) (HCC)   COPD exacerbation (HCC)   Discharge Instructions:  Activity:  As tolerated  Discharge Instructions    (HEART FAILURE PATIENTS) Call MD:  Anytime you have any of the following symptoms: 1) 3 pound weight gain in 24 hours or 5 pounds in 1 week 2) shortness of breath, with or without a dry hacking cough 3) swelling in the hands, feet or stomach 4) if you have to sleep on extra pillows at night in order to breathe.    Complete by:  As directed    Diet - low sodium heart healthy    Complete by:  As directed    Increase activity slowly    Complete by:  As directed      Allergies as of 01/01/2017      Reactions   Floxin [ofloxacin] Nausea Only, Other (See Comments)   Dizziness, Vertigo Dizziness, Vertigo      Medication List    TAKE these medications   albuterol 108 (90 Base) MCG/ACT inhaler Commonly known as:  PROVENTIL HFA;VENTOLIN HFA Inhale 2 puffs into the lungs every 4 (four) hours as needed for wheezing or shortness of breath.   amLODipine 5 MG tablet Commonly known as:  NORVASC Take 5 mg by mouth daily.   apixaban 5 MG Tabs tablet Commonly known as:  ELIQUIS Take 1 tablet (5 mg total) by mouth 2 (two) times daily.  atorvastatin 10 MG tablet Commonly known as:  LIPITOR Take 10 mg by mouth daily after supper.   doxycycline 100 MG tablet Commonly known as:  VIBRA-TABS Take 1 tablet (100 mg total) by mouth every 12 (twelve) hours.   fenofibrate 160 MG tablet Take 160 mg by mouth daily after supper.     ferrous sulfate 325 (65 FE) MG EC tablet Take 325 mg by mouth daily after supper.   fluticasone 50 MCG/ACT nasal spray Commonly known as:  FLONASE Place 2 sprays into both nostrils daily. What changed:  additional instructions   furosemide 20 MG tablet Commonly known as:  LASIX Take 2 tablets (40 mg total) by mouth daily. What changed:  how much to take   ketoconazole 2 % cream Commonly known as:  NIZORAL pt uses 1 application topically daily as needed for rash   levothyroxine 175 MCG tablet Commonly known as:  SYNTHROID, LEVOTHROID TAKE 1 TABLET BY MOUTH DAILY BEFORE BREAKFAST.   losartan 100 MG tablet Commonly known as:  COZAAR Take 100 mg by mouth daily.   MUCINEX D PO Take 1 capsule by mouth daily.   multivitamin with minerals Tabs tablet Take 1 tablet by mouth daily.   omeprazole 20 MG capsule Commonly known as:  PRILOSEC Take 1 capsule (20 mg total) by mouth 2 (two) times daily. What changed:  when to take this   potassium chloride 10 MEQ tablet Commonly known as:  K-DUR Take 2 tablets (20 mEq total) by mouth daily.   predniSONE 10 MG tablet Commonly known as:  DELTASONE Take 4 tablets (40 mg) daily for 2 days, then, Take 3 tablets (30 mg) daily for 2 days, then, Take 2 tablets (20 mg) daily for 2 days, then, Take 1 tablets (10 mg) daily for 1 days, then stop   propranolol ER 60 MG 24 hr capsule Commonly known as:  INDERAL LA Take 2 tablets by mouth in the a.m, 1 tablet by mouth in the afternoon, and 1 tablet by mouth at night. What changed:  how much to take  how to take this  when to take this  additional instructions   Tiotropium Bromide Monohydrate 2.5 MCG/ACT Aers Commonly known as:  SPIRIVA RESPIMAT Inhale 2 puffs into the lungs daily.      Follow-up Information    Little, Lennette Bihari, MD. Schedule an appointment as soon as possible for a visit in 1 week(s).   Specialty:  Family Medicine Contact information: [place]  [place] [address] [phone]        Jerline Pain, MD. Schedule an appointment as soon as possible for a visit in 2 week(s).   Specialty:  Cardiology Contact information: [address]  [place] [address] [phone]          Allergies  Allergen Reactions  . Floxin [Ofloxacin] Nausea Only and Other (See Comments)    Dizziness, Vertigo Dizziness, Vertigo    Consultations:   None   Other Procedures/Studies: Dg Chest 2 View  Result Date: 12/30/2016 CLINICAL DATA:  Sharp chest pain. History of lymphadenopathy and breast cancer EXAM: CHEST  2 VIEW COMPARISON:  Chest CT 07/15/2016, chest radiograph 06/09/2016 FINDINGS: Stable enlarged cardiac silhouette. Calcified hilar lymph nodes again noted. Chronic perihilar bronchitic markings. No overt pulmonary edema. No pneumothorax. No pulmonary edema. Small bilateral pleural effusions seen on lateral projection IMPRESSION: 1. Small bilateral effusions. 2. Chronic parenchymal perihilar lung changes similar to comparison exam. 3. Stable calcified hilar lymph nodes. Electronically  Signed   By: Suzy Bouchard M.D.   On: 12/30/2016 09:58      TODAY-DAY OF DISCHARGE:  Subjective:   Kristin Hood today has no headache,no chest abdominal pain,no new weakness tingling or numbness, feels much better wants to go home today.   Objective:   Blood pressure 115/69, pulse 81, temperature 97.7 F (36.5 C), temperature source Oral, resp. rate 20, height 5\' 5"  (1.651 m), weight 110.4 kg (243 lb 4.8 oz), SpO2 95 %.  Intake/Output Summary (Last 24 hours) at 01/01/17 1058 Last data filed at 01/01/17 1011  Gross per 24 hour  Intake              840 ml  Output             1700 ml  Net             -860 ml   Filed Weights   12/30/16 1721 12/31/16 0456 01/01/17 0505  Weight: 112.1 kg (247 lb 3.2 oz) 110.9 kg (244 lb 8 oz) 110.4 kg (243 lb 4.8 oz)    Exam: Awake Alert, Oriented *3, No new F.N deficits, Normal  affect .AT,PERRAL Supple Neck,No JVD, No cervical lymphadenopathy appriciated.  Symmetrical Chest wall movement, Good air movement bilaterally, CTAB RRR,No Gallops,Rubs or new Murmurs, No Parasternal Heave +ve B.Sounds, Abd Soft, Non tender, No organomegaly appriciated, No rebound -guarding or rigidity. No Cyanosis, Clubbing or edema, No new Rash or bruise   PERTINENT RADIOLOGIC STUDIES: Dg Chest 2 View  Result Date: 12/30/2016 CLINICAL DATA:  Sharp chest pain. History of lymphadenopathy and breast cancer EXAM: CHEST  2 VIEW COMPARISON:  Chest CT 07/15/2016, chest radiograph 06/09/2016 FINDINGS: Stable enlarged cardiac silhouette. Calcified hilar lymph nodes again noted. Chronic perihilar bronchitic markings. No overt pulmonary edema. No pneumothorax. No pulmonary edema. Small bilateral pleural effusions seen on lateral projection IMPRESSION: 1. Small bilateral effusions. 2. Chronic parenchymal perihilar lung changes similar to comparison exam. 3. Stable calcified hilar lymph nodes. Electronically Signed   By: Suzy Bouchard M.D.   On: 12/30/2016 09:58     PERTINENT LAB RESULTS: CBC:  Recent Labs  12/30/16 0919 12/31/16 0349  WBC 6.1 3.6*  HGB 11.0* 9.9*  HCT 36.1 31.6*  PLT 307 242   CMET CMP     Component Value Date/Time   NA 136 01/01/2017 0939   NA 141 07/15/2016 0906   K 4.0 01/01/2017 0939   K 4.0 07/15/2016 0906   CL 99 (L) 01/01/2017 0939   CO2 26 01/01/2017 0939   CO2 25 07/15/2016 0906   GLUCOSE 174 (H) 01/01/2017 0939   GLUCOSE 115 07/15/2016 0906   BUN 36 (H) 01/01/2017 0939   BUN 20.7 07/15/2016 0906   CREATININE 1.17 (H) 01/01/2017 0939   CREATININE 1.1 07/15/2016 0906   CALCIUM 9.1 01/01/2017 0939   CALCIUM 9.6 07/15/2016 0906   PROT 7.4 07/15/2016 0906   ALBUMIN 3.6 07/15/2016 0906   AST 19 07/15/2016 0906   ALT 13 07/15/2016 0906   ALKPHOS 29 (L) 07/15/2016 0906   BILITOT 1.13 07/15/2016 0906   GFRNONAA 44 (L) 01/01/2017 0939   GFRAA 51 (L)  01/01/2017 0939    GFR Estimated Creatinine Clearance: 49.8 mL/min (A) (by C-G formula based on SCr of 1.17 mg/dL (H)). No results for input(s): LIPASE, AMYLASE in the last 72 hours. No results for input(s): CKTOTAL, CKMB, CKMBINDEX, TROPONINI in the last 72 hours. Invalid input(s): POCBNP  Recent Labs  12/30/16 0919  DDIMER 0.72*  No results for input(s): HGBA1C in the last 72 hours. No results for input(s): CHOL, HDL, LDLCALC, TRIG, CHOLHDL, LDLDIRECT in the last 72 hours. No results for input(s): TSH, T4TOTAL, T3FREE, THYROIDAB in the last 72 hours.  Invalid input(s): FREET3 No results for input(s): VITAMINB12, FOLATE, FERRITIN, TIBC, IRON, RETICCTPCT in the last 72 hours. Coags: No results for input(s): INR in the last 72 hours.  Invalid input(s): PT Microbiology: No results found for this or any previous visit (from the past 240 hour(s)).  FURTHER DISCHARGE INSTRUCTIONS:  Get Medicines reviewed and adjusted: Please take all your medications with you for your next visit with your Primary MD  Laboratory/radiological data: Please request your Primary MD to go over all hospital tests and procedure/radiological results at the follow up, please ask your Primary MD to get all Hospital records sent to his/her office.  In some cases, they will be blood work, cultures and biopsy results pending at the time of your discharge. Please request that your primary care M.D. goes through all the records of your hospital data and follows up on these results.  Also Note the following: If you experience worsening of your admission symptoms, develop shortness of breath, life threatening emergency, suicidal or homicidal thoughts you must seek medical attention immediately by calling 911 or calling your MD immediately  if symptoms less severe.  You must read complete instructions/literature along with all the possible adverse reactions/side effects for all the Medicines you take and that have  been prescribed to you. Take any new Medicines after you have completely understood and accpet all the possible adverse reactions/side effects.   Do not drive when taking Pain medications or sleeping medications (Benzodaizepines)  Do not take more than prescribed Pain, Sleep and Anxiety Medications. It is not advisable to combine anxiety,sleep and pain medications without talking with your primary care practitioner  Special Instructions: If you have smoked or chewed Tobacco  in the last 2 yrs please stop smoking, stop any regular Alcohol  and or any Recreational drug use.  Wear Seat belts while driving.  Please note: You were cared for by a hospitalist during your hospital stay. Once you are discharged, your primary care physician will handle any further medical issues. Please note that NO REFILLS for any discharge medications will be authorized once you are discharged, as it is imperative that you return to your primary care physician (or establish a relationship with a primary care physician if you do not have one) for your post hospital discharge needs so that they can reassess your need for medications and monitor your lab values.  Total Time spent coordinating discharge including counseling, education and face to face time equals  45 minutes.  SignedOren Binet 01/01/2017 10:58 AM

## 2017-01-01 NOTE — Plan of Care (Signed)
Problem: Safety: Goal: Ability to remain free from injury will improve Outcome: Progressing Encourage pt to use call bell when she need to get up.   Problem: Health Behavior/Discharge Planning: Goal: Ability to manage health-related needs will improve Outcome: Progressing Encouraged pt to follow up with PCP and to continue medication recommendation per doctors orders.   Problem: Pain Managment: Goal: General experience of comfort will improve Outcome: Progressing Assess pt pain using 0-10 pain scale.   Problem: Skin Integrity: Goal: Risk for impaired skin integrity will decrease Outcome: Progressing Encourage patient monitor area under breast for skin break down and to let the doctor know if feels hot to touch, drainage is present and feels painful.  Problem: Activity: Goal: Risk for activity intolerance will decrease Outcome: Progressing Encourage pt ambulation.

## 2017-01-05 NOTE — Progress Notes (Signed)
Cardiology Office Note   Date:  01/06/2017   ID:  Kristin Hood, DOB 1940-08-29, MRN [id number]  PCP:  [name], MD  Cardiologist:  [name]    Chief Complaint  Patient presents with  . Hospitalization Follow-up      History of Present Illness: Kristin Hood is a 76 y.o. female who presents for heart failure follow up and post hospital follow up from COPD exacerbation and acute diastolic HF, improved quickly with diuretics.  BNP was 481 , hgb slightly low at 9.9 troponin neg. Cr 1.17.   Permanent atrial fibrillation discovered on 08/20/12, chronic anticoagulation on Eliquis, COPD, prior stroke, received TPA for cerebral artery occlusion (could not raise arm-8/13) here for followup. Lives next door to [facility] and [employer] ([place]).    catheterization on 08/14/11 which showed no evidence of coronary artery disease. LVEDP was 14 mm mercury. Echo 05/2016 with EF 55-60%, mild AS, LA severely dilated. PA pk pressure 46 mmHg.   Today she is feeling much better.   Her wt is back to 240 on her home scales and 241 lbs here.  On admit to hospital she was 250 lbs.  She only took 20 mg of lasix today down from 40 mg which I agree with. Will make second 20 mg PRN.  Also decrease the K+. She admits to taking a week off of monitoring diet and salt.  She is back to her usual routine.    Past Medical History:  Diagnosis Date  . Anginal pain ([place])   . Arthritis   . Atrial fibrillation ([place]) 07/2012   Eliquis, rate controlled  . Breast cancer ([place]) 2001   left mastectomy  . Coarse tremors    , Essential  . Complication of anesthesia    SMALLER TUBE FOR INTUBATION AS GAGS ON TUBE  . COPD (chronic obstructive pulmonary disease) ([place])   . CVA (cerebral vascular accident) ([place]) 2013  . Dysrhythmia 08/20/2012   Atrial Fibrillation  . Family history of adverse reaction to anesthesia    mother experienced pain with intubation   . GERD (gastroesophageal reflux disease)    history of   .  Hard of hearing   . Heart murmur   . History of chemotherapy 11/13/1998 to 2010   Adriamycin/Docotaxol, Dexorubicin/Taxotere, Femara, Tamoxifen  . History of hiatal hernia    history of   . Hx of adenomatous colonic polyps 2006   , Benign  . Hypercholesteremia   . Hypertension    , With mild concentric left ventricular hypertrophy  . Mixed dyslipidemia   . Shingles   . Shortness of breath dyspnea    on exertion   . Spider veins   . Stroke ([place]) 11/17/2011   left side brain-speech-TPA  . Thyroid nodule   . Tremor, essential   . Valvular sclerosis 09/23/2003   without stenosis  . Varicose veins     Past Surgical History:  Procedure Laterality Date  . APPENDECTOMY  07/10/1957  . BIOPSY THYROID  12/01/2013   ultrasound  . BREAST SURGERY    . BUNIONECTOMY  11/29/1988   bilateral with hammer toes  . CARDIAC CATHETERIZATION  07/2010   , Without significant CAD  . CARDIAC CATHETERIZATION  07/2011   [name]  . CESAREAN SECTION    . CESAREAN SECTION  05/1968, 07/1965  . EYE SURGERY  01/31/2013   eye lid; cataract surgery bilat   . JOINT REPLACEMENT  05/2001   left knee  . JOINT  REPLACEMENT  11/2001   right knee  . JOINT REPLACEMENT  01/2008   redo right knee  . KNEE SURGERY  2009   ,Total knee replacement  . LEFT HEART CATHETERIZATION WITH CORONARY ANGIOGRAM N/A 08/14/2011   Procedure: LEFT HEART CATHETERIZATION WITH CORONARY ANGIOGRAM;  Surgeon: [name], MD;  Location: [facility] CATH LAB;  Service: Cardiovascular;  Laterality: N/A;  . Left Rotator Cuff    . MASTECTOMY  10/19/1998   Radical, left -with lymph nodes (8 total)  . Reverse bunionectomy  08/29/1993   removal of Tibial Sesamoid-left  . TEE WITHOUT CARDIOVERSION  11/19/2011   Procedure: TRANSESOPHAGEAL ECHOCARDIOGRAM (TEE);  Surgeon: [name], MD;  Location: [facility] ENDOSCOPY;  Service: Cardiovascular;  Laterality: N/A;  . THYROIDECTOMY N/A 03/02/2015   Procedure: TOTAL THYROIDECTOMY;  Surgeon: Armandina Gemma, MD;   Location: [facility] ORS;  Service: General;  Laterality: N/A;  . TONSILLECTOMY    . TOTAL HIP ARTHROPLASTY Left 10/11/2014   Procedure: LEFT TOTAL HIP ARTHROPLASTY ANTERIOR APPROACH;  Surgeon: [name], MD;  Location: [facility] ORS;  Service: Orthopedics;  Laterality: Left;     Current Outpatient Prescriptions  Medication Sig Dispense Refill  . albuterol (PROVENTIL HFA;VENTOLIN HFA) 108 (90 Base) MCG/ACT inhaler Inhale 2 puffs into the lungs every 4 (four) hours as needed for wheezing or shortness of breath. 1 Inhaler 5  . amLODipine (NORVASC) 5 MG tablet Take 5 mg by mouth daily.    Marland Kitchen apixaban (ELIQUIS) 5 MG TABS tablet Take 1 tablet (5 mg total) by mouth 2 (two) times daily. 180 tablet 3  . atorvastatin (LIPITOR) 10 MG tablet Take 10 mg by mouth daily after supper.     . fenofibrate 160 MG tablet Take 160 mg by mouth daily after supper.     . ferrous sulfate 325 (65 FE) MG EC tablet Take 325 mg by mouth daily after supper.     . fluticasone (FLONASE) 50 MCG/ACT nasal spray Place 2 sprays into both nostrils as needed for allergies or rhinitis.    Marland Kitchen ketoconazole (NIZORAL) 2 % cream pt uses 1 application topically daily as needed for rash  0  . levothyroxine (SYNTHROID, LEVOTHROID) 175 MCG tablet TAKE 1 TABLET BY MOUTH DAILY BEFORE BREAKFAST. 30 tablet 2  . losartan (COZAAR) 100 MG tablet Take 100 mg by mouth daily.    . Multiple Vitamin (MULTIVITAMIN WITH MINERALS) TABS tablet Take 1 tablet by mouth daily.    Marland Kitchen omeprazole (PRILOSEC) 20 MG capsule Take 20 mg by mouth daily.    . predniSONE (DELTASONE) 10 MG tablet Take 4 tablets (40 mg) daily for 2 days, then, Take 3 tablets (30 mg) daily for 2 days, then, Take 2 tablets (20 mg) daily for 2 days, then, Take 1 tablets (10 mg) daily for 1 days, then stop 19 tablet 0  . propranolol ER (INDERAL LA) 60 MG 24 hr capsule Take 2 tablets by mouth in the a.m, 1 tablet by mouth in the afternoon, and 1 tablet by mouth at night. 360 capsule 1  .  Pseudoephedrine-Guaifenesin (MUCINEX D PO) Take 1 capsule by mouth daily.     . Tiotropium Bromide Monohydrate (SPIRIVA RESPIMAT) 2.5 MCG/ACT AERS Inhale 2 puffs into the lungs daily. 3 Inhaler 3  . furosemide (LASIX) 20 MG tablet Take 1 tablet (20 mg total) by mouth daily. May take extra tablet daily as needed for weight gain 120 tablet 3  . potassium chloride (K-DUR) 10 MEQ tablet Take 1 tablet (10 mEq total) by mouth  daily. Take extra tablet by mouth when taking extra furosemide 120 tablet 3   No current facility-administered medications for this visit.     Allergies:   Floxin [ofloxacin]    Social History:  The patient  reports that she quit smoking about 24 years ago. Her smoking use included Cigarettes. She has a 60.00 pack-year smoking history. She has never used smokeless tobacco. She reports that she drinks about 0.6 oz of alcohol per week . She reports that she does not use drugs.   Family History:  The patient's family history includes Aortic stenosis in her mother; Heart attack in her father; Heart disease in her father and mother; Heart failure in her father.    ROS:  General:no colds or fevers, no weight changes Skin:no rashes or ulcers HEENT:no blurred vision, no congestion CV:see HPI PUL:see HPI GI:no diarrhea constipation or melena, no indigestion GU:no hematuria, no dysuria MS:no joint pain, no claudication Neuro:no syncope, no lightheadedness Endo:no diabetes, + thyroid disease  Wt Readings from Last 3 Encounters:  01/06/17 241 lb 6.4 oz (109.5 kg)  01/01/17 243 lb 4.8 oz (110.4 kg)  12/09/16 241 lb 12.8 oz (109.7 kg)     PHYSICAL EXAM: VS:  BP 122/80   Pulse 68   Ht 5\' 5"  (1.651 m)   Wt 241 lb 6.4 oz (109.5 kg)   SpO2 98%   BMI 40.17 kg/m  , BMI Body mass index is 40.17 kg/m. General:Pleasant affect, NAD Skin:Warm and dry, brisk capillary refill HEENT:normocephalic, sclera clear, mucus membranes moist Neck:supple, no JVD, no bruits  Heart:irreg irreg  without murmur, gallup, rub or click Lungs:clear without rales, rhonchi, or wheezes KNL:ZJQB, non tender, + BS, do not palpate liver spleen or masses Ext:no lower ext edema, 2+ pedal pulses, 2+ radial pulses Neuro:alert and oriented X 3, MAE, follows commands, + facial symmetry    EKG:  EKG is NOT ordered today. The ekg from the hospital a fib with RVR no acute changes   Recent Labs: 07/15/2016: ALT 13 10/27/2016: TSH 3.00 12/30/2016: B Natriuretic Peptide 481.7 12/31/2016: Hemoglobin 9.9; Platelets 242 01/01/2017: BUN 36; Creatinine, Ser 1.17; Potassium 4.0; Sodium 136    Lipid Panel    Component Value Date/Time   CHOL 148 11/18/2011 0420   TRIG 101 11/18/2011 0420   HDL 34 (L) 11/18/2011 0420   CHOLHDL 4.4 11/18/2011 0420   VLDL 20 11/18/2011 0420   LDLCALC 94 11/18/2011 0420       Other studies Reviewed: Additional studies/ records that were reviewed today include: . Echo 06/10/16 Study Conclusions  - Left ventricle: The cavity size was normal. Wall thickness was   normal. Systolic function was normal. The estimated ejection   fraction was in the range of 55% to 60%. Wall motion was normal;   there were no regional wall motion abnormalities. - Aortic valve: There was mild stenosis. There was trivial   regurgitation. - Mitral valve: Moderately calcified annulus. There was mild   regurgitation. - Left atrium: The atrium was severely dilated. - Right atrium: The atrium was moderately dilated. - Pulmonary arteries: Systolic pressure was mildly increased. PA   peak pressure: 46 mm Hg (S).  Impressions:  - Normal LV systolic function; biatrial enlargement; calcified   aortic valve with mild AS with mean gradient 10 mmHg and trace   AI; mild MR; mild TR with mildly elevated pulmonary pressure.   ASSESSMENT AND PLAN:  1.  Recent acute on chronic diastolic HF now resolved, will decrease lasix  to 20 mg daily and another 20 mg if wt up 3 lbs in a day or 5 lbs in an  week.  Will decrease K+ to 10 meq per day and check BMP, though with extra lasix another 10 meq of Kdur.  Follow up with [name] in 4 months.  2.  Permanent a fib rate controlled. On Eliquis with no bleeding but mildly anemic in hospital will recheck CBC  3.  Hx of CVA, no recent symptoms  4.  Obesity watching wt again.   5.  HTN controlled.    Current medicines are reviewed with the patient today.  The patient Has no concerns regarding medicines.  The following changes have been made:  See above Labs/ tests ordered today include:see above  Disposition:   FU:  see above  Signed, Cecilie Kicks, NP  01/06/2017 1:08 PM    [facility] [address], [place], [place], [place] Phone: [phone]; Fax: [phone]

## 2017-01-06 ENCOUNTER — Encounter: Payer: Self-pay | Admitting: Cardiology

## 2017-01-06 ENCOUNTER — Ambulatory Visit (INDEPENDENT_AMBULATORY_CARE_PROVIDER_SITE_OTHER): Payer: PPO | Admitting: Cardiology

## 2017-01-06 VITALS — BP 122/80 | HR 68 | Ht 65.0 in | Wt 241.4 lb

## 2017-01-06 DIAGNOSIS — I5032 Chronic diastolic (congestive) heart failure: Secondary | ICD-10-CM | POA: Diagnosis not present

## 2017-01-06 DIAGNOSIS — D649 Anemia, unspecified: Secondary | ICD-10-CM | POA: Diagnosis not present

## 2017-01-06 DIAGNOSIS — I482 Chronic atrial fibrillation, unspecified: Secondary | ICD-10-CM

## 2017-01-06 DIAGNOSIS — I10 Essential (primary) hypertension: Secondary | ICD-10-CM

## 2017-01-06 DIAGNOSIS — Z7901 Long term (current) use of anticoagulants: Secondary | ICD-10-CM | POA: Diagnosis not present

## 2017-01-06 LAB — CBC WITH DIFFERENTIAL/PLATELET
BASOS ABS: 0 10*3/uL (ref 0.0–0.2)
Basos: 0 %
EOS (ABSOLUTE): 0.1 10*3/uL (ref 0.0–0.4)
EOS: 1 %
HEMATOCRIT: 36.5 % (ref 34.0–46.6)
HEMOGLOBIN: 12 g/dL (ref 11.1–15.9)
IMMATURE GRANULOCYTES: 1 %
Immature Grans (Abs): 0 10*3/uL (ref 0.0–0.1)
Lymphocytes Absolute: 0.8 10*3/uL (ref 0.7–3.1)
Lymphs: 9 %
MCH: 28.2 pg (ref 26.6–33.0)
MCHC: 32.9 g/dL (ref 31.5–35.7)
MCV: 86 fL (ref 79–97)
MONOCYTES: 7 %
Monocytes Absolute: 0.6 10*3/uL (ref 0.1–0.9)
NEUTROS PCT: 82 %
Neutrophils Absolute: 7.4 10*3/uL — ABNORMAL HIGH (ref 1.4–7.0)
Platelets: 354 10*3/uL (ref 150–379)
RBC: 4.25 x10E6/uL (ref 3.77–5.28)
RDW: 15.6 % — AB (ref 12.3–15.4)
WBC: 8.9 10*3/uL (ref 3.4–10.8)

## 2017-01-06 LAB — BASIC METABOLIC PANEL
BUN/Creatinine Ratio: 33 — ABNORMAL HIGH (ref 12–28)
BUN: 35 mg/dL — AB (ref 8–27)
CALCIUM: 9.7 mg/dL (ref 8.7–10.3)
CHLORIDE: 99 mmol/L (ref 96–106)
CO2: 25 mmol/L (ref 20–29)
CREATININE: 1.07 mg/dL — AB (ref 0.57–1.00)
GFR calc Af Amer: 58 mL/min/{1.73_m2} — ABNORMAL LOW (ref >59)
GFR calc non Af Amer: 50 mL/min/{1.73_m2} — ABNORMAL LOW (ref >59)
GLUCOSE: 103 mg/dL — AB (ref 65–99)
Potassium: 4.6 mmol/L (ref 3.5–5.2)
Sodium: 138 mmol/L (ref 134–144)

## 2017-01-06 MED ORDER — FUROSEMIDE 20 MG PO TABS: 20.0000 mg | ORAL_TABLET | Freq: Every day | ORAL | 3 refills | Status: DC

## 2017-01-06 MED ORDER — POTASSIUM CHLORIDE ER 10 MEQ PO TBCR: 10.0000 meq | EXTENDED_RELEASE_TABLET | Freq: Every day | ORAL | 3 refills | Status: DC

## 2017-01-06 NOTE — Patient Instructions (Addendum)
Medication Instructions:  Your physician has recommended you make the following change in your medication:  Decrease furosemide to 20 mg by mouth daily.--may take additional 20 mg daily if weight gain over 3 lbs in one day or 5 lbs in one week.  Decrease potassium to 10 meq by mouth daily. Take extra tablet on days you take extra furosemide.     Labwork: Lab work to be done today--BMP and CBC  Testing/Procedures: none  Follow-Up: Your physician recommends that you schedule a follow-up appointment in: 4-5 months with [name]. --Scheduled for 05/21/17 at 10:20    Any Other Special Instructions Will Be Listed Below (If Applicable).     If you need a refill on your cardiac medications before your next appointment, please call your pharmacy.

## 2017-01-07 ENCOUNTER — Telehealth: Payer: Self-pay | Admitting: Cardiology

## 2017-01-07 NOTE — Telephone Encounter (Signed)
Follow Up:      Returning your call from today. 

## 2017-01-07 NOTE — Telephone Encounter (Signed)
**Note De-Identified Kristin Hood Obfuscation** See phone note from 12/03/16.

## 2017-01-07 NOTE — Telephone Encounter (Signed)
The pt states that she will order her Eliquis now from her mail in pharmacy and that when it arrives she will bring her receipt, which should be more than enough to meet the 3% out of pocket expense requirement, to the office.  She is aware that I will fax it to [employer] and that I will cal her as soon as I get the decision.  She verbalized understanding and thanked me for calling her.

## 2017-01-07 NOTE — Telephone Encounter (Signed)
**Note De-Identified Rainy Rothman Obfuscation** Denial received Alonie Gazzola fax on Pt assistance for Eliquis from [employer]. Reason: Pt must have spent 3% of her yearly income out of pocket on medications before [employer] will approve pt assistance.  Per Jake Samples at [facility] the pt has only paid $55.65 out of pocket for the year and needs to pay $104.26. Jake Samples states that if the pt has or does spend $104.26 in the near future we can fax them proof that she has and her application will be approved.  I have left a message asking the pt to call me back.

## 2017-01-07 NOTE — Telephone Encounter (Signed)
Patient was returning call made by Jeani Hawking about her patient assistance. Call forwarded to [facility].

## 2017-01-09 ENCOUNTER — Other Ambulatory Visit: Payer: Self-pay | Admitting: Cardiology

## 2017-01-09 ENCOUNTER — Telehealth: Payer: Self-pay | Admitting: Internal Medicine

## 2017-01-09 NOTE — Telephone Encounter (Signed)
[facility]ill 90 day supply please

## 2017-01-09 NOTE — Telephone Encounter (Signed)
REFILL 

## 2017-01-12 MED ORDER — LEVOTHYROXINE SODIUM 175 MCG PO TABS: ORAL_TABLET | ORAL | 0 refills | Status: DC

## 2017-01-12 NOTE — Telephone Encounter (Signed)
Rx sent to requested pharmacy

## 2017-01-13 NOTE — Telephone Encounter (Signed)
The pt mailed her medication receipt to me. I have re-faxed her pt assistance application to [employer] with a message that the pt has paid more out of pocket on her medications and maybe eligible to receive pt assistance for her Eliquis at this time.

## 2017-01-14 ENCOUNTER — Other Ambulatory Visit: Payer: Self-pay | Admitting: *Deleted

## 2017-01-14 DIAGNOSIS — D649 Anemia, unspecified: Secondary | ICD-10-CM | POA: Diagnosis not present

## 2017-01-14 DIAGNOSIS — E669 Obesity, unspecified: Secondary | ICD-10-CM | POA: Diagnosis not present

## 2017-01-14 DIAGNOSIS — I509 Heart failure, unspecified: Secondary | ICD-10-CM | POA: Diagnosis not present

## 2017-01-14 DIAGNOSIS — J449 Chronic obstructive pulmonary disease, unspecified: Secondary | ICD-10-CM | POA: Diagnosis not present

## 2017-01-14 DIAGNOSIS — Z6839 Body mass index (BMI) 39.0-39.9, adult: Secondary | ICD-10-CM | POA: Diagnosis not present

## 2017-01-14 DIAGNOSIS — R21 Rash and other nonspecific skin eruption: Secondary | ICD-10-CM | POA: Diagnosis not present

## 2017-01-14 NOTE — Patient Outreach (Signed)
[facility]) Care Management  01/14/2017  Kristin Hood [birth year] [id number]   EMMI- COPD RED ON EMMI ALERT DAY#: 8 DATE: 01/13/17 RED ALERT: Feeling worse overall? Yes More chest pain than yesterday? Yes Mucus change color? Yes # of times rescue inhaler used in past 24 hours? 1  Incoming phone call from patient. HIPAA identifiers verified with patient. Patient confirmed, she was feeling worse overall. She had a low blood pressure reading, which was concerning to her. She was using a wrist blood pressure monitor and changed to a different cuff. Patient stated, her blood pressure was better after she changed the blood pressure cuff (100/80), which made her feel better. Patient acknowledged having chest pain. She believes the chest pain was related to her GERD. Patient reported, she has severe "heart burn". She is prescribed Protonix three times per day; 2 tabs in the morning, 1 tab at lunch, and 2 tabs in the evening. Patient reported, she does have a small amount of mucus production at bed time and this is normal for her. The mucus production is related to her CHF and COPD, per patient. Patient scheduled a primary MD visit yesterday (01/14/17) to address her concerns. Her blood pressure and weight at the MD's office was 111/11 and 273 pounds. Patient stated, her health concerns were alleviated after visiting with the doctor.   Patient reported, she received, read, and understood the hospital discharge instructions. She confirmed taking her medications as prescribed, including her Eliquis, Protonix, Lasix, and Albuterol. Patient stated, she has a medical alert device. Patient stated, she is very thankful for [facility] services.  Plan: RN CM will send patient EMMI educational materials. RM CM will send successful outreach letter to patient  RN CM will notify [facility] Case Management Assistant regarding case closure.   Lake Bells, RN, BSN, MHA/MSL, [place] Telephonic Care  Manager Coordinator [facility] Direct Phone: [phone] Toll Free: [phone] Fax: [phone]

## 2017-01-14 NOTE — Patient Outreach (Signed)
Patient triggered RED on EMMI COPD Dashboard, notification sent to Lake Bells, RN

## 2017-01-14 NOTE — Patient Outreach (Signed)
[place] [facility]) Care Management  01/14/2017  Kristin Hood [birth year] [id number]  EMMI- COPD RED ON EMMI ALERT DAY#: 8 DATE: 01/13/17 RED ALERT: Feeling worse overall? Yes More chest pain than yesterday? Yes Mucus change color? Yes # of times rescue inhaler used in past 24 hours? 1  Outreach attempt #1 to patient. No answer. RN CM left HIPAA compliant message along with contact info.    Plan: RN CM will contact patient within one week.   Lake Bells, RN, BSN, MHA/MSL, [place] Telephonic Care Manager Coordinator [facility] Direct Phone: [phone] Toll Free: [phone] Fax: [phone]

## 2017-01-15 ENCOUNTER — Ambulatory Visit: Payer: Self-pay | Admitting: *Deleted

## 2017-01-15 DIAGNOSIS — L959 Vasculitis limited to the skin, unspecified: Secondary | ICD-10-CM | POA: Diagnosis not present

## 2017-01-15 DIAGNOSIS — L821 Other seborrheic keratosis: Secondary | ICD-10-CM | POA: Diagnosis not present

## 2017-01-15 DIAGNOSIS — M5442 Lumbago with sciatica, left side: Secondary | ICD-10-CM | POA: Diagnosis not present

## 2017-01-16 ENCOUNTER — Encounter: Payer: Self-pay | Admitting: *Deleted

## 2017-01-16 ENCOUNTER — Other Ambulatory Visit: Payer: Self-pay | Admitting: Family Medicine

## 2017-01-16 DIAGNOSIS — I4891 Unspecified atrial fibrillation: Secondary | ICD-10-CM | POA: Diagnosis not present

## 2017-01-16 DIAGNOSIS — J449 Chronic obstructive pulmonary disease, unspecified: Secondary | ICD-10-CM | POA: Diagnosis not present

## 2017-01-16 DIAGNOSIS — E890 Postprocedural hypothyroidism: Secondary | ICD-10-CM | POA: Diagnosis not present

## 2017-01-16 DIAGNOSIS — I5032 Chronic diastolic (congestive) heart failure: Secondary | ICD-10-CM | POA: Diagnosis not present

## 2017-01-16 DIAGNOSIS — R8290 Unspecified abnormal findings in urine: Secondary | ICD-10-CM | POA: Diagnosis not present

## 2017-01-16 DIAGNOSIS — Z0000 Encounter for general adult medical examination without abnormal findings: Secondary | ICD-10-CM | POA: Diagnosis not present

## 2017-01-16 DIAGNOSIS — R590 Localized enlarged lymph nodes: Secondary | ICD-10-CM | POA: Diagnosis not present

## 2017-01-16 DIAGNOSIS — Z853 Personal history of malignant neoplasm of breast: Secondary | ICD-10-CM | POA: Diagnosis not present

## 2017-01-16 DIAGNOSIS — E782 Mixed hyperlipidemia: Secondary | ICD-10-CM | POA: Diagnosis not present

## 2017-01-16 DIAGNOSIS — Z8673 Personal history of transient ischemic attack (TIA), and cerebral infarction without residual deficits: Secondary | ICD-10-CM | POA: Diagnosis not present

## 2017-01-16 DIAGNOSIS — R7301 Impaired fasting glucose: Secondary | ICD-10-CM | POA: Diagnosis not present

## 2017-01-16 DIAGNOSIS — I10 Essential (primary) hypertension: Secondary | ICD-10-CM | POA: Diagnosis not present

## 2017-01-16 NOTE — Telephone Encounter (Signed)
Approval on Eliquis received from [employer] pt assistance program. Approval good from 01/15/17 until 03/30/17.

## 2017-01-19 ENCOUNTER — Other Ambulatory Visit: Payer: Self-pay | Admitting: *Deleted

## 2017-01-19 ENCOUNTER — Telehealth: Payer: Self-pay | Admitting: Emergency Medicine

## 2017-01-19 NOTE — Patient Outreach (Signed)
[facility]) Care Management  01/19/2017  Kristin Hood 09-11-1940 [id number]  EMMI- COPD RED ON EMMI ALERT DAY#: 11 DATE: 01/16/17 RED ALERT: Lost interest in doing things? Yes  Outreach phone call to patient. HIPAA verified with patient. Patient reported, the EMMI automated phone questionnaire recorded her answer incorrectly. Per patient, she is doing well. She was returning home from [employer]. Patient mentioned, she was having back pain on Sunday, which prevented her from attending a fall festival at church. Patient verbalized being active, constantly. She completes her grocery shopping, goes to the [facility] 3 times per week, and performs her house work. Her weight today is 240 pounds, which is her target weight, per patient. She plans to check her blood pressure today, when she is more relaxed. Patient stated, she doesn't have another primary MD visit scheduled until 2019.   Plan: RN CM will notify [facility] CM administrative assistant regarding case closure.    Lake Bells, RN, BSN, MHA/MSL, [place] Telephonic Care Manager Coordinator [facility] Direct Phone: [phone] Toll Free: [phone] Fax: [phone]

## 2017-01-19 NOTE — Telephone Encounter (Signed)
ATC patient but her VM was not setup. Will wait for her to call back so that I can call the medication into the correct pharmacy for her.   [facility]

## 2017-01-20 ENCOUNTER — Encounter: Payer: Self-pay | Admitting: *Deleted

## 2017-01-20 MED ORDER — TIOTROPIUM BROMIDE MONOHYDRATE 2.5 MCG/ACT IN AERS: 2.0000 | INHALATION_SPRAY | Freq: Every day | RESPIRATORY_TRACT | 3 refills | Status: DC

## 2017-01-20 NOTE — Telephone Encounter (Signed)
Spoke with patient. She stated that she [facility] stating that she needed an OV in order to get refills on her Spiriva. Patient was just seen by RB last month. She stated that she had attempted to qualify for patient assistance, but she hadn't pay at least $600 for her medications yet.   Advised patient that I could go ahead and [facility] [address]. She verbalized understanding. Nothing else needed at time of call.

## 2017-01-21 ENCOUNTER — Other Ambulatory Visit: Payer: Self-pay | Admitting: *Deleted

## 2017-01-21 NOTE — Telephone Encounter (Signed)
This encounter was created in error - please disregard.

## 2017-01-21 NOTE — Patient Outreach (Signed)
[facility]) Care Management  01/21/2017  CLAUDENE GATLIFF 30-Nov-1940 [id number]   EMMI- COPD RED ON EMMI ALERT DAY#: 14 DATE: 01/19/17 RED ALERT: Problems refilling medications? Yes  Outreach attempt #1 to patient. No answer. RN CM left HIPAA compliant message along with contact info.    Plan: RN CM will contact patient within one week.  Lake Bells, RN, BSN, MHA/MSL, [place] Telephonic Care Manager Coordinator [facility] Direct Phone: [phone] Toll Free: [phone] Fax: [phone]

## 2017-01-22 ENCOUNTER — Telehealth: Payer: Self-pay | Admitting: Emergency Medicine

## 2017-01-22 ENCOUNTER — Other Ambulatory Visit: Payer: Self-pay | Admitting: *Deleted

## 2017-01-22 MED ORDER — TIOTROPIUM BROMIDE MONOHYDRATE 2.5 MCG/ACT IN AERS: 2.0000 | INHALATION_SPRAY | Freq: Every day | RESPIRATORY_TRACT | 0 refills | Status: DC

## 2017-01-22 NOTE — Telephone Encounter (Signed)
Spoke with pt, who states in 11/2016 she applied to patient assistants for [place]. Pt states she was denied due to be 50 dollars short of 600 dollars out of pocket in medications. Pt states she has now reached the recommended 600 dollar amount. Pt request that pt assistants forms be resubmitted. I have faxed forms to provided number. Pt has been proved with one sample of Spiriva.    Will route to [place] to f/u on.

## 2017-01-22 NOTE — Patient Outreach (Signed)
[place] [facility]) Care Management  01/22/2017  LEIGHANNA KIRN 01-29-1941 [id number]   EMMI- COPD RED ON EMMI ALERT DAY#: 14 DATE: 01/19/17 RED ALERT: Problems refilling medications? Yes  Outreach telephone to patient. HIPAA identifiers verified with patient. Patient confirmed, she will be in the donut hole during the last two months of 2018. She wants to be proactive with obtaining her Spiriva. Patient explained, the Spiriva will cost $350 for a month supply, which she can't afford. She has completed a form to assist with obtaining/cost of the Spiriva. Patient plans to take the form to her Pulmonologist office. RN CM informed patient regarding making a referral to [place]. RN CM explained to patient that the [facility] referral will give her a different option for obtaining the Spiriva, if needed. Patient agreed to [facility] services.   Patient described "how well she was doing". Patient stated, she missed my telephone call on 01/21/17, due to participating in several activities.   Plan: RN CM will send referral to [place] for assistance with affording medication. RN CM advised patient to contact RNCM for any needs or concerns.   Lake Bells, RN, BSN, MHA/MSL, [place] Telephonic Care Manager Coordinator [facility] Direct Phone: [phone] Toll Free: [phone] Fax: [phone]

## 2017-01-23 ENCOUNTER — Other Ambulatory Visit: Payer: Self-pay | Admitting: Internal Medicine

## 2017-01-23 DIAGNOSIS — Z1231 Encounter for screening mammogram for malignant neoplasm of breast: Secondary | ICD-10-CM

## 2017-01-27 ENCOUNTER — Telehealth: Payer: Self-pay | Admitting: Pharmacist

## 2017-01-27 NOTE — Patient Outreach (Signed)
[facility]) Care Management  01/27/2017  Kristin Hood 04-Sep-1940 [id number]   Called patient per referral from [place] for medication assistance. Unfortunately, the patient's phone was not operational so I could not leave a message. An inbasket message was sent to one of the CMA's at Endocrinology to offer assistance with the Spiriva Patient Assistance Application that has been started.  In addition, a TROOP document was requested from [place] to assess the patient's out of pocket expenditures.   Plan:  Call patient back in 5-7 business days

## 2017-01-27 NOTE — Telephone Encounter (Signed)
I have not yet received an approval or denial for patient assistance. This process could take a few weeks.

## 2017-01-28 ENCOUNTER — Encounter: Payer: Self-pay | Admitting: *Deleted

## 2017-01-28 DIAGNOSIS — M5442 Lumbago with sciatica, left side: Secondary | ICD-10-CM | POA: Diagnosis not present

## 2017-01-28 NOTE — Telephone Encounter (Signed)
This encounter was created in error - please disregard.

## 2017-01-29 ENCOUNTER — Other Ambulatory Visit: Payer: PPO

## 2017-02-02 ENCOUNTER — Other Ambulatory Visit: Payer: Self-pay | Admitting: Pharmacist

## 2017-02-02 NOTE — Patient Outreach (Signed)
[facility] [facility]) Care Management  02/02/2017  EVAN OSBURN Aug 10, 1940 [id number]   Called patient to follow up on medication assistance. Patient did not answer the phone. HIPAA compliant message was left on the patient's voicemail.  Patient's provider's office has already submitted a patient assistance application on the patient's behalf.  Plan: Call patient back in 5-7 business days.  [name], PharmD, [place] Clinical Pharmacist [phone]

## 2017-02-09 NOTE — Telephone Encounter (Signed)
As of today, I have still not received anything back on this. Continue to wait decision.

## 2017-02-11 ENCOUNTER — Ambulatory Visit: Payer: Self-pay | Admitting: Pharmacist

## 2017-02-13 ENCOUNTER — Other Ambulatory Visit: Payer: Self-pay | Admitting: Pharmacist

## 2017-02-13 ENCOUNTER — Encounter: Payer: Self-pay | Admitting: *Deleted

## 2017-02-13 NOTE — Patient Outreach (Signed)
[facility]) Care Management  [place]   02/13/2017  JAI BEAR 22-Jan-1941 [id number]  Subjective: Patient was called regarding medication assistance per referral for medication assistance. HIPAA identifiers were obtained.  Patient is a 76 year old female with multiple medical conditions including but not limited to:   A. fib, varicose veins, thyroid nodules, hypertension, hyperlipidemia, CHF, COPD, and osteoarthritis.  Patient reported having difficulty paying for Eliquis and Spiriva. Patient said she already receives Eliquis from the [employer] Patient Assistance Program and has completed an application for [employer]'s program but has been denied.  Patient said she was unsure why she was denied but has actually already sent two applications.   Objective:   Encounter Medications: Outpatient Encounter Medications as of 02/13/2017  Medication Sig Note  . albuterol (PROVENTIL HFA;VENTOLIN HFA) 108 (90 Base) MCG/ACT inhaler Inhale 2 puffs into the lungs every 4 (four) hours as needed for wheezing or shortness of breath.   Marland Kitchen amLODipine (NORVASC) 5 MG tablet Take 5 mg by mouth daily.   Marland Kitchen apixaban (ELIQUIS) 5 MG TABS tablet Take 1 tablet (5 mg total) by mouth 2 (two) times daily.   Marland Kitchen atorvastatin (LIPITOR) 20 MG tablet Take 10 mg by mouth daily after supper.  02/13/2017: Patient reported the provider increased her dose to 20mg   . fenofibrate 160 MG tablet Take 160 mg by mouth daily after supper.    . ferrous sulfate 325 (65 FE) MG EC tablet Take 325 mg by mouth daily after supper.    . fluticasone (FLONASE) 50 MCG/ACT nasal spray Place 2 sprays into both nostrils as needed for allergies or rhinitis.   . furosemide (LASIX) 20 MG tablet Take 1 tablet (20 mg total) by mouth daily. May take extra tablet daily as needed for weight gain   . ketoconazole (NIZORAL) 2 % cream pt uses 1 application topically daily as needed for rash   . levothyroxine (SYNTHROID,  LEVOTHROID) 175 MCG tablet TAKE 1 TABLET BY MOUTH DAILY BEFORE BREAKFAST.   Marland Kitchen losartan (COZAAR) 100 MG tablet Take 100 mg by mouth daily.   . Multiple Vitamin (MULTIVITAMIN WITH MINERALS) TABS tablet Take 1 tablet by mouth daily.   Marland Kitchen omeprazole (PRILOSEC) 20 MG capsule Take 20 mg by mouth daily. 02/13/2017: Taking PRN  . potassium chloride (K-DUR) 10 MEQ tablet Take 1 tablet (10 mEq total) by mouth daily. Take extra tablet by mouth when taking extra furosemide   . propranolol ER (INDERAL LA) 60 MG 24 hr capsule TAKE 2 CAPS IN THE MORNING TAKE 1 CAP IN THE AFTERNOON, 1 CAP AT NIGHT   . Pseudoephedrine-Guaifenesin (MUCINEX D PO) Take 1 capsule by mouth daily.    . Tiotropium Bromide Monohydrate (SPIRIVA RESPIMAT) 2.5 MCG/ACT AERS Inhale 2 puffs into the lungs daily.   . [DISCONTINUED] predniSONE (DELTASONE) 10 MG tablet Take 4 tablets (40 mg) daily for 2 days, then, Take 3 tablets (30 mg) daily for 2 days, then, Take 2 tablets (20 mg) daily for 2 days, then, Take 1 tablets (10 mg) daily for 1 days, then stop (Patient not taking: Reported on 02/13/2017)   . [DISCONTINUED] Tiotropium Bromide Monohydrate (SPIRIVA RESPIMAT) 2.5 MCG/ACT AERS Inhale 2 puffs into the lungs daily.    No facility-administered encounter medications on file as of 02/13/2017.     Functional Status: In your present state of health, do you have any difficulty performing the following activities: 12/30/2016 06/09/2016  Hearing? Tempie Donning  Vision? N N  Difficulty  concentrating or making decisions? N N  Walking or climbing stairs? Y Y  Dressing or bathing? N N  Doing errands, shopping? N Y  Some recent data might be hidden    Fall/Depression Screening: Fall Risk  06/26/2015  Falls in the past year? No      Assessment: Patient's medications were reviewed via telephone.   Drugs sorted by system:  Neurologic/Psychologic: Propranolol (for  tremor)  Cardiovascular: Amlodipine Eliquis Atorvastatin Fenofibrate Furosemide Losartan  Pulmonary/Allergy: Albuterol HFA Fluticasone nasal Mucinex DM Spiriva  Gastrointestinal: Omeprazole  Endocrine: Levothyroxine  Topical: Ketoconazole  Pain:  Vitamins/Minerals: Ferrous Sulfate Multiple Vitamin Potassium   Medication Review Findings: Adherence-patient does not use Spiriva consistently due to cost.   Medication Assistance Findings: [place] Patient Assistance Program was called on the patient's behalf.  The representative said the patient's application had been denied because the box denoting if a patient has private insurance was checked.  For the patient's application to be approved, an appeal letter would need to be sent.  The CMA at [place] Desmond Dike) and Dr. [name] were sent a message alerting them of the necessity of the appeal letter and an actual letter that could be used:  [employer] Patient Assistance Program:   Re: [place]   Ms. Ziare Orrick is under my medical care and I am requesting an appeal of her application to participate in your patient assistance program.    On Ms. Balthazar's original application, it was notated that she had private insurance. This notation was in error. Ms. Kalisz is enrolled in a Medicare Part D plan and is now in the "donut hole". As such, she is unable to afford her medication co-pays. Ms. Ende could really benefit from being able to receive Spiriva at no cost due to her financial hardship.   Please reconsider her application. Thank you so much for your time, consideration and generosity..     Sincerely,      Plan:  Follow up with [employer] in 3-5 business days. Follow up with the patient as well.   [name], PharmD, [place] Clinical Pharmacist [phone]

## 2017-02-16 DIAGNOSIS — Z0100 Encounter for examination of eyes and vision without abnormal findings: Secondary | ICD-10-CM | POA: Diagnosis not present

## 2017-02-17 ENCOUNTER — Other Ambulatory Visit: Payer: Self-pay | Admitting: Orthopedic Surgery

## 2017-02-17 ENCOUNTER — Telehealth: Payer: Self-pay

## 2017-02-17 ENCOUNTER — Ambulatory Visit
Admission: RE | Admit: 2017-02-17 | Discharge: 2017-02-17 | Disposition: A | Discharge status: Home / Self Care | Payer: PPO | Source: Ambulatory Visit | Attending: Internal Medicine | Admitting: Internal Medicine

## 2017-02-17 DIAGNOSIS — Z1231 Encounter for screening mammogram for malignant neoplasm of breast: Secondary | ICD-10-CM | POA: Diagnosis not present

## 2017-02-17 DIAGNOSIS — M5442 Lumbago with sciatica, left side: Secondary | ICD-10-CM

## 2017-02-17 NOTE — Telephone Encounter (Signed)
Pt called b/c she is confused on her imaging schedule. GI called pt that CT CAP were scheduled. She is making sure Dr [name] did not order anything new. This CT CAP is due in April 2019. Dr Seward Speck ordered CT lumbar due now. Will call GI for pt and have them remove CT CAP and add CT lumbar.   S/w GI, the CT CAP were moved to April 16 at 9 am. . They are working on scheduling the lumbar myelogram. They need clearance for blood thinners.  Called pt back to let her know.

## 2017-02-18 ENCOUNTER — Other Ambulatory Visit: Payer: Self-pay | Admitting: Pharmacist

## 2017-02-18 NOTE — Patient Outreach (Signed)
[facility]) Care Management  02/18/2017  Kristin Hood 10/21/1940 [id number]   [employer] Patient Assistance Program was called regarding the patient's application.  An appeal letter was written and sent to Dr. Agustina Caroli office for review and to be sent to the program.  The representative at [employer] confirmed they received the letter and said the letter was under review.  Patient was called to update her on the status of her application.  Plan: Follow back up with [place] next week and call the patient back.  [name], PharmD, [place] Clinical Pharmacist [phone]

## 2017-02-25 ENCOUNTER — Ambulatory Visit: Payer: Self-pay | Admitting: Pharmacist

## 2017-02-25 ENCOUNTER — Other Ambulatory Visit: Payer: Self-pay | Admitting: Pharmacist

## 2017-02-25 NOTE — Patient Outreach (Signed)
[facility]) Care Management  02/25/2017  Kristin Hood [birth year] [id number]   Patient was called to follow up on her application with [place] for Spiriva. Unfortunately, the patient did not answer the phone.  [employer] was called on the patient's behalf. The representative I spoke with today said they received the appeal letter but now the entire application and financial paperwork has to be resent.  I was not told this on the first two times I called.  The representative said they do not keep anything once an application has been denied.  Plan:  Call patient back in 5-7 business days if I do not hear from her first.   [name], PharmD, [place] Clinical Pharmacist [phone]

## 2017-02-26 ENCOUNTER — Other Ambulatory Visit: Payer: PPO

## 2017-02-26 ENCOUNTER — Ambulatory Visit
Admission: RE | Admit: 2017-02-26 | Discharge: 2017-02-26 | Disposition: A | Discharge status: Home / Self Care | Payer: PPO | Source: Ambulatory Visit | Attending: Family Medicine | Admitting: Family Medicine

## 2017-02-26 DIAGNOSIS — I6523 Occlusion and stenosis of bilateral carotid arteries: Secondary | ICD-10-CM | POA: Diagnosis not present

## 2017-02-26 DIAGNOSIS — Z8673 Personal history of transient ischemic attack (TIA), and cerebral infarction without residual deficits: Secondary | ICD-10-CM

## 2017-02-27 ENCOUNTER — Telehealth: Payer: Self-pay

## 2017-02-27 NOTE — Telephone Encounter (Signed)
   [facility] Pre-operative Risk Assessment    Request for surgical clearance:  1. What type of surgery is being performed? Lumbar myelogram  2. When is this surgery scheduled? Not determined  3. Are there any medications that need to be held prior to surgery and how long? Eliquis for 2 days   4. Practice name and name of physician performing surgery? [facility]   5. What is your office phone and fax number? [phone] Fax:[phone]  6. Anesthesia type (None, local, MAC, general) ? Not Noted    Mendel Ryder 02/27/2017, 2:12 PM  _________________________________________________________________   (provider comments below)

## 2017-03-02 NOTE — Telephone Encounter (Signed)
Patient with diagnosis of Afib on Eliquis for anticoagulation.    Procedure: lumbar myelogram Date of procedure: TBD  CHADS2-VASc score of  7 (CHF, HTN, AGE, DM2, stroke/tia x 2, CAD, AGE, female)  CrCl 68ml/min  Generally hold anticoagulation for 3 days prior to spinal procedure, but given history of stroke would recommend holding for as short a duration as possible and resume after procedure as soon as possible.  Will route to [name] for recommendation given history of stroke.

## 2017-03-02 NOTE — Telephone Encounter (Signed)
   Primary Cardiologist: No primary care provider on file.  Chart reviewed as part of pre-operative protocol coverage. Patient was contacted 03/02/2017 in reference to pre-operative risk assessment for pending surgery as outlined below.  Kristin Hood was last seen on 01/06/17 by Cecilie Kicks.  Since that day, Kristin Hood has done well. However has gained 5lb last week. During this time she went to eat outside twice. She did not watch her salt intake. Currently taking double dose of lasix (40mg  qd). Weight has been stable. She is able to do small house hold stuff and able to take care of her self.   Will route to pharmacy for anticoagulations and [name].    [place], [place] 03/02/2017, 3:03 PM

## 2017-03-03 ENCOUNTER — Telehealth: Payer: Self-pay

## 2017-03-03 ENCOUNTER — Encounter: Payer: Self-pay | Admitting: Emergency Medicine

## 2017-03-03 NOTE — Telephone Encounter (Signed)
If we do not have spiriva samples, please see if we have Incruse. If so give her one to fill the gap, make sure she knows how to use

## 2017-03-03 NOTE — Telephone Encounter (Signed)
Message from [place], Generic sent at 03/03/2017 8:33 AM EST -----    Dear Dr. [name],  I am hoping you can help me. I am out of Spiriva. I have not heard from the [facility] since you sent the last letter. Since I am in the donut, I was hoping they would help. I really can't afford more til the first of the year.  If you have some or know something else I can use this month I would greatly appreciate it. I will come get it.    My breathing and mucus amount has been good lately and I have not used the rescue inhaler more than once a week if that..    Thank you for all you do,   Kristin Hood   RB we do not have any Spiriva samples.

## 2017-03-03 NOTE — Telephone Encounter (Signed)
I sent the message to RB through a telephone encounter. We did not have samples of Spiriva.

## 2017-03-03 NOTE — Telephone Encounter (Signed)
Spoke with RB and advised him that Incruse is out of stock as well. He stated we could try Stiolto for the gap for right now. I spoke with pt, and advised her of plan. She understood and will come to get samples tomorrow.  Nothing further is needed.

## 2017-03-04 ENCOUNTER — Other Ambulatory Visit: Payer: Self-pay | Admitting: Pharmacist

## 2017-03-04 NOTE — Patient Outreach (Signed)
[facility]) Care Management  03/04/2017  JOCILYN TREGO [birth year] [id number]   Called patient to follow up on medication assistance. HIPAA identifiers were obtained. Unfortunately, we were not able to get the application for [place] approved because the patient checked the box stating  that she had private insurance when she does not. She has Medicare Part D. An appeal letter was sent on the patient's behalf but the company requested the entire application be redone and financial paperwork redone as well.  Since it is the end of the year, the patient decided to try again next year. Also, her provider switched her to [facility] temporarily and gave her samples until the new year begins.  Plan:  Follow up with the patient in February to start the process for Spiriva over with [place].   [name], PharmD, [place] Clinical Pharmacist [phone]

## 2017-03-04 NOTE — Telephone Encounter (Signed)
Unfortunately, she needs to hold for 3 days Eliquis if she wishes to have spinal procedure. She will have a mild increased risk of stoke without anticoagulation.   [name], MD

## 2017-03-05 NOTE — Telephone Encounter (Signed)
   Primary Cardiologist: No primary care provider on file.  Chart reviewed as part of pre-operative protocol coverage. Given past medical history and time since last visit, based on ACC/AHA guidelines, Kristin Hood would be at acceptable risk for the planned procedure without further cardiovascular testing. Ok to stop Eliquis 3 days befor though she is at increased risk for CVA pt is aware and wants to proceed.    I will route this recommendation to the requesting party via Epic fax function and remove from pre-op pool.  Please call with questions.  Cecilie Kicks, NP 03/05/2017, 2:47 PM

## 2017-03-17 ENCOUNTER — Ambulatory Visit
Admission: RE | Admit: 2017-03-17 | Discharge: 2017-03-17 | Disposition: A | Discharge status: Home / Self Care | Payer: PPO | Source: Ambulatory Visit | Attending: Orthopedic Surgery | Admitting: Orthopedic Surgery

## 2017-03-17 VITALS — BP 117/65 | HR 74

## 2017-03-17 DIAGNOSIS — M5442 Lumbago with sciatica, left side: Secondary | ICD-10-CM

## 2017-03-17 DIAGNOSIS — M48061 Spinal stenosis, lumbar region without neurogenic claudication: Secondary | ICD-10-CM | POA: Diagnosis not present

## 2017-03-17 MED ORDER — DIAZEPAM 5 MG PO TABS
5.0000 mg | ORAL_TABLET | Freq: Once | ORAL | Status: AC
  Administered 2017-03-17: 5 mg via ORAL

## 2017-03-17 MED ORDER — IOPAMIDOL (ISOVUE-M 200) INJECTION 41%
20.0000 mL | Freq: Once | INTRAMUSCULAR | Status: AC
  Administered 2017-03-17: 20 mL via INTRATHECAL

## 2017-03-17 MED ORDER — MEPERIDINE HCL 50 MG/ML IJ SOLN
50.0000 mg | Freq: Once | INTRAMUSCULAR | Status: AC
Start: 2017-03-17 — End: 2017-03-17
  Administered 2017-03-17: 50 mg via INTRAMUSCULAR

## 2017-03-17 MED ORDER — ONDANSETRON HCL 4 MG/2ML IJ SOLN
4.0000 mg | Freq: Once | INTRAMUSCULAR | Status: AC
Start: 2017-03-17 — End: 2017-03-17
  Administered 2017-03-17: 4 mg via INTRAMUSCULAR

## 2017-03-17 NOTE — Discharge Instructions (Signed)
Myelogram Discharge Instructions  1. Go home and rest quietly for the next 24 hours.  It is important to lie flat for the next 24 hours.  Get up only to go to the restroom.  You may lie in the bed or on a couch on your back, your stomach, your left side or your right side.  You may have one pillow under your head.  You may have pillows between your knees while you are on your side or under your knees while you are on your back.  2. DO NOT drive today.  Recline the seat as far back as it will go, while still wearing your seat belt, on the way home.  3. You may get up to go to the bathroom as needed.  You may sit up for 10 minutes to eat.  You may resume your normal diet and medications unless otherwise indicated.  Drink lots of extra fluids today and tomorrow.  4. The incidence of headache, nausea, or vomiting is about 5% (one in 20 patients).  If you develop a headache, lie flat and drink plenty of fluids until the headache goes away.  Caffeinated beverages may be helpful.  If you develop severe nausea and vomiting or a headache that does not go away with flat bed rest, call [phone].  5. You may resume normal activities after your 24 hours of bed rest is over; however, do not exert yourself strongly or do any heavy lifting tomorrow. If when you get up you have a headache when standing, go back to bed and force fluids for another 24 hours.  6. Call your physician for a follow-up appointment.  The results of your myelogram will be sent directly to your physician by the following day.  7. If you have any questions or if complications develop after you arrive home, please call [phone].  Discharge instructions have been explained to the patient.  The patient, or the person responsible for the patient, fully understands these instructions  MAY RESTART ELIQUIS TODAY.

## 2017-03-17 NOTE — Progress Notes (Signed)
Patient states she has been off Eliquis for at least the past two days. 

## 2017-03-27 DIAGNOSIS — M5442 Lumbago with sciatica, left side: Secondary | ICD-10-CM | POA: Diagnosis not present

## 2017-04-20 IMAGING — DX DG PORTABLE PELVIS
1 series · 1 of 1 positions shown · non-contrast
Comparison: MRI left hip of 04/25/2014

CLINICAL DATA: Status post left hip replacement

EXAM:
PORTABLE PELVIS 1-2 VIEWS

[pelvis ap]
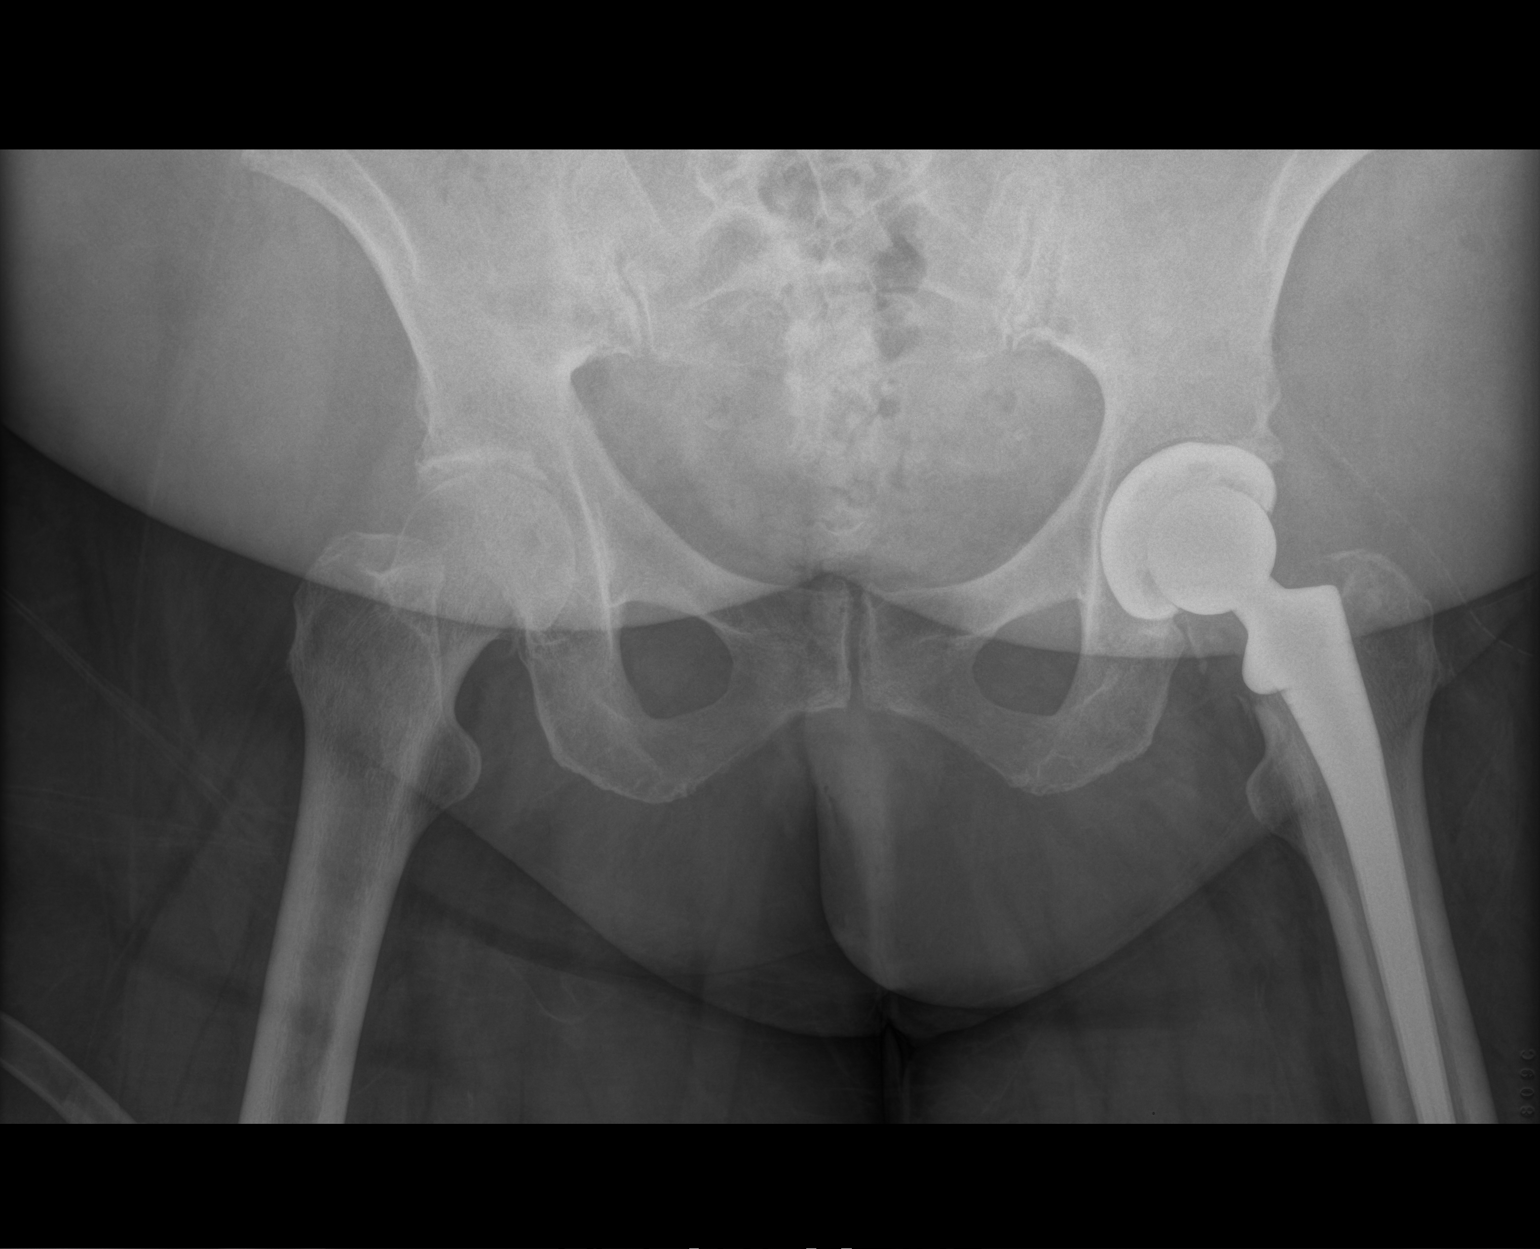

[1 of 1 positions shown; findings below may reference images not displayed]

FINDINGS: The femoral and acetabular components of the left total hip
replacement appear to be in good position and alignment. No
complicating features are seen. Mild degenerative change of the
right hip is noted. The pelvic rami are intact.
IMPRESSION: Left total hip replacement components in good position with no
complicating features.

## 2017-05-11 ENCOUNTER — Other Ambulatory Visit: Payer: Self-pay | Admitting: Pharmacist

## 2017-05-11 NOTE — Patient Outreach (Signed)
[facility]) Care Management  [place]   05/11/2017  Kristin Hood [birth year] [id number]  Subjective: Patient called about medication assistance with Spiriva. HIPAA identifiers were obtained. Patient is a 77 year old female with multiple medical conditions including but not limited to: A. fib, varicose veins, thyroid nodules, hypertension, hyperlipidemia, CHF, COPD, and osteoarthritis.  Patient had an application for Spiriva completed last year but it was done incorrectly. Even after reaching out to [employer] and writing a hardship letter, the patient was denied.     Objective:   Encounter Medications: Outpatient Encounter Medications as of 05/11/2017  Medication Sig Note  . albuterol (PROVENTIL HFA;VENTOLIN HFA) 108 (90 Base) MCG/ACT inhaler Inhale 2 puffs into the lungs every 4 (four) hours as needed for wheezing or shortness of breath.   Marland Kitchen amLODipine (NORVASC) 5 MG tablet Take 5 mg by mouth daily.   Marland Kitchen apixaban (ELIQUIS) 5 MG TABS tablet Take 1 tablet (5 mg total) by mouth 2 (two) times daily.   Marland Kitchen atorvastatin (LIPITOR) 20 MG tablet Take 10 mg by mouth daily after supper.  02/13/2017: Patient reported the provider increased her dose to 20mg   . fenofibrate 160 MG tablet Take 160 mg by mouth daily after supper.    . ferrous sulfate 325 (65 FE) MG EC tablet Take 325 mg by mouth daily after supper.    Marland Kitchen ketoconazole (NIZORAL) 2 % cream pt uses 1 application topically daily as needed for rash   . levothyroxine (SYNTHROID, LEVOTHROID) 175 MCG tablet TAKE 1 TABLET BY MOUTH DAILY BEFORE BREAKFAST.   Marland Kitchen losartan (COZAAR) 100 MG tablet Take 100 mg by mouth daily.   . Multiple Vitamin (MULTIVITAMIN WITH MINERALS) TABS tablet Take 1 tablet by mouth daily.   Marland Kitchen omeprazole (PRILOSEC) 20 MG capsule Take 20 mg by mouth daily. 02/13/2017: Taking PRN  . propranolol ER (INDERAL LA) 60 MG 24 hr capsule TAKE 2 CAPS IN THE MORNING TAKE 1 CAP IN THE AFTERNOON, 1 CAP AT  NIGHT   . Pseudoephedrine-Guaifenesin (MUCINEX D PO) Take 1 capsule by mouth daily.    . traMADol (ULTRAM) 50 MG tablet Take 50 mg by mouth every 8 (eight) hours as needed. for pain   . fluticasone (FLONASE) 50 MCG/ACT nasal spray Place 2 sprays into both nostrils as needed for allergies or rhinitis.   . furosemide (LASIX) 20 MG tablet Take 1 tablet (20 mg total) by mouth daily. May take extra tablet daily as needed for weight gain   . potassium chloride (K-DUR) 10 MEQ tablet Take 1 tablet (10 mEq total) by mouth daily. Take extra tablet by mouth when taking extra furosemide   . Tiotropium Bromide Monohydrate (SPIRIVA RESPIMAT) 2.5 MCG/ACT AERS Inhale 2 puffs into the lungs daily. (Patient not taking: Reported on 05/11/2017) 05/11/2017: Has run out of this. Currently working on medication assistance paper work.   No facility-administered encounter medications on file as of 05/11/2017.     Functional Status: In your present state of health, do you have any difficulty performing the following activities: 12/30/2016 06/09/2016  Hearing? Tempie Donning  Vision? N N  Difficulty concentrating or making decisions? N N  Walking or climbing stairs? Y Y  Dressing or bathing? N N  Doing errands, shopping? N Y  Some recent data might be hidden    Fall/Depression Screening: Fall Risk  06/26/2015  Falls in the past year? No   No flowsheet data found.    Assessment:  Medications Reviewed Today  Reviewed by [name], [place] (Pharmacist) on 05/11/17 at [place] List Status: <None>  Medication Order Taking? Sig Documenting Provider Last Dose Status Informant  albuterol (PROVENTIL HFA;VENTOLIN HFA) 108 (90 Base) MCG/ACT inhaler [id number] Yes Inhale 2 puffs into the lungs every 4 (four) hours as needed for wheezing or shortness of breath. Collene Gobble, MD Taking Active Self  amLODipine (NORVASC) 5 MG tablet [id number] Yes Take 5 mg by mouth daily. [provider] Taking Active Self  apixaban  (ELIQUIS) 5 MG TABS tablet [id number] Yes Take 1 tablet (5 mg total) by mouth 2 (two) times daily. Jerline Pain, MD Taking Active Self  atorvastatin (LIPITOR) 20 MG tablet [id number] Yes Take 10 mg by mouth daily after supper.  [provider] Taking Active Self           Med Note [name]   Fri Feb 13, 2017 12:04 PM) Patient reported the provider increased her dose to 20mg   fenofibrate 160 MG tablet [id number] Yes Take 160 mg by mouth daily after supper.  [provider] Taking Active Self  ferrous sulfate 325 (65 FE) MG EC tablet [id number] Yes Take 325 mg by mouth daily after supper.  [provider] Taking Active Self  fluticasone (FLONASE) 50 MCG/ACT nasal spray [id number] No Place 2 sprays into both nostrils as needed for allergies or rhinitis. [provider] Unknown Active   furosemide (LASIX) 20 MG tablet [id number]  Take 1 tablet (20 mg total) by mouth daily. May take extra tablet daily as needed for weight gain Isaiah Serge, NP  Expired 04/06/17 2359   ketoconazole (NIZORAL) 2 % cream [id number] Yes pt uses 1 application topically daily as needed for rash [provider] Taking Active Self           Med Note Warner Mccreedy, CHASITIE R   Mon Nov 12, 2015  3:50 PM)    levothyroxine (SYNTHROID, LEVOTHROID) 175 MCG tablet [id number] Yes TAKE 1 TABLET BY MOUTH DAILY BEFORE BREAKFAST. Philemon Kingdom, MD Taking Active   losartan (COZAAR) 100 MG tablet [id number] Yes Take 100 mg by mouth daily. [provider] Taking Active Self  Multiple Vitamin (MULTIVITAMIN WITH MINERALS) TABS tablet [id number] Yes Take 1 tablet by mouth daily. [provider] Taking Active Self           Med Note [facility], CHASITIE R   Wed May 16, 2015  2:42 PM)    omeprazole (PRILOSEC) 20 MG capsule [id number] Yes Take 20 mg by mouth daily. [provider] Taking Active            Med Note [name]   Fri Feb 13, 2017 12:07 PM) Taking PRN  potassium  chloride (K-DUR) 10 MEQ tablet [id number]  Take 1 tablet (10 mEq total) by mouth daily. Take extra tablet by mouth when taking extra furosemide Isaiah Serge, NP  Expired 04/06/17 2359   propranolol ER (INDERAL LA) 60 MG 24 hr capsule [id number] Yes TAKE 2 CAPS IN THE MORNING TAKE 1 CAP IN THE AFTERNOON, 1 CAP AT Jeri Lager, NP Taking Active   Pseudoephedrine-Guaifenesin [facility] D PO) [id number] Yes Take 1 capsule by mouth daily.  [provider] Taking Active Self  Tiotropium Bromide Monohydrate (SPIRIVA RESPIMAT) 2.5 MCG/ACT AERS [id number] No Inhale 2 puffs into the lungs daily.  Patient not taking:  Reported on 05/11/2017   Collene Gobble, MD Not Taking Active  Med Note Corinna Lines May 11, 2017 10:36 AM) Has run out of this. Currently working on medication assistance paper work.  traMADol (ULTRAM) 50 MG tablet [id number] Yes Take 50 mg by mouth every 8 (eight) hours as needed. for pain [provider] Taking Active           Patient's medications were reviewed via telephone.  Drugs sorted by system:  Neurologic/Psychologic: Propranolol ER (for tremor)  Cardiovascular: Amlodipine Eliquis Atorvastatin Fenofibrate Furosemide Losartan  Pulmonary/Allergy: Albuterol HFA Fluticasone nasal Mucinex DM Spiriva  Gastrointestinal: Omeprazole (PRN only)  Endocrine: Levothyroxine  Topical: Ketoconazole  Vitamins/Minerals: Ferrous Sulfate Multiple Vitamin Potassium Chloride   Medication Review Findings: Adherence-patient does not use Spiriva consistently due to cost.   Medication Assistance Findings: Patient has [employer] Patient is over income for Extra Help Patient may qualify for medication assistance with [employer]'s program for Spiriva and [place] program for Eliquis. The program for Eliquis requires patients to spend at least 3% of their income out of pocket in medication  expenses. (Patient is not there yet).  Plan: A letter will be routed to [place], [place] walker for the patient will be mailed an application for [employer].  Follow up with the patient in 2 weeks to check up on the application and follow up on her [place].  [name], PharmD, [place] Clinical Pharmacist [phone]

## 2017-05-12 ENCOUNTER — Ambulatory Visit: Payer: Self-pay | Admitting: Pharmacist

## 2017-05-21 ENCOUNTER — Encounter: Payer: Self-pay | Admitting: Cardiology

## 2017-05-21 ENCOUNTER — Ambulatory Visit: Payer: PPO | Admitting: Cardiology

## 2017-05-21 VITALS — BP 132/76 | HR 72 | Ht 66.0 in | Wt 238.8 lb

## 2017-05-21 DIAGNOSIS — I5032 Chronic diastolic (congestive) heart failure: Secondary | ICD-10-CM | POA: Diagnosis not present

## 2017-05-21 DIAGNOSIS — Z7901 Long term (current) use of anticoagulants: Secondary | ICD-10-CM

## 2017-05-21 DIAGNOSIS — I482 Chronic atrial fibrillation, unspecified: Secondary | ICD-10-CM

## 2017-05-21 DIAGNOSIS — I272 Pulmonary hypertension, unspecified: Secondary | ICD-10-CM

## 2017-05-21 LAB — CBC
Hematocrit: 35 % (ref 34.0–46.6)
Hemoglobin: 11.3 g/dL (ref 11.1–15.9)
MCH: 28.4 pg (ref 26.6–33.0)
MCHC: 32.3 g/dL (ref 31.5–35.7)
MCV: 88 fL (ref 79–97)
PLATELETS: 325 10*3/uL (ref 150–379)
RBC: 3.98 x10E6/uL (ref 3.77–5.28)
RDW: 14.5 % (ref 12.3–15.4)
WBC: 4.2 10*3/uL (ref 3.4–10.8)

## 2017-05-21 LAB — BASIC METABOLIC PANEL
BUN/Creatinine Ratio: 26 (ref 12–28)
BUN: 33 mg/dL — ABNORMAL HIGH (ref 8–27)
CALCIUM: 9.6 mg/dL (ref 8.7–10.3)
CHLORIDE: 104 mmol/L (ref 96–106)
CO2: 22 mmol/L (ref 20–29)
Creatinine, Ser: 1.26 mg/dL — ABNORMAL HIGH (ref 0.57–1.00)
GFR calc Af Amer: 47 mL/min/{1.73_m2} — ABNORMAL LOW (ref >59)
GFR calc non Af Amer: 41 mL/min/{1.73_m2} — ABNORMAL LOW (ref >59)
Glucose: 104 mg/dL — ABNORMAL HIGH (ref 65–99)
POTASSIUM: 4.9 mmol/L (ref 3.5–5.2)
Sodium: 142 mmol/L (ref 134–144)

## 2017-05-21 MED ORDER — APIXABAN 5 MG PO TABS: 5.0000 mg | ORAL_TABLET | Freq: Two times a day (BID) | ORAL | 3 refills | Status: DC

## 2017-05-21 NOTE — Progress Notes (Signed)
Patient ID: Kristin Hood, female   DOB: May 24, 1940, 77 y.o.   MRN: [id number]      [address], [address] [place], [place]  [address] Phone: [phone] Fax:  [phone]  Date:  05/21/2017   ID:  Kristin Hood, DOB 11-06-1940, MRN [id number]  PCP:  [name], MD   History of Present Illness: Kristin Hood is a 77 y.o. female with atrial fibrillation discovered on 08/20/12, chronic anticoagulation, COPD, prior stroke, received TPA for cerebral artery occlusion (could not raise arm-8/13) here for followup. Lives [place] to [facility] and [employer] [facility]    Echocardiogram demonstrated mild to moderate left atrial enlargement, normal ejection fraction, mild aortic stenosis, MAC, mild mitral regurgitation.  Had 3 knee replacements.   She underwent catheterization on 08/14/11 which showed no evidence of coronary artery disease. LVEDP was 14 mm mercury.  Went to ER on 12/10/12 had a terrific pain in her chest. Lasted about hour at home BP 159/100, 3 times. Went on to ER. Was worried. Once she got to [facility] [place] she quit hurting. Sent to ER. Wanted to keep her overnight. Did troponin did well. Did OK. Prob GERD.   Went again to [facility]. Afib increased rate. PET scan. 3 nodules one thyroid. Endocrine, no good sample.   Unfortunately, her son-in-law died 2 weeks after neck surgery from pulmonary embolism.  08-09-2014 - Hip surgery. Dr. Wynelle Link. She has had extensive workup including oncology workup for nodules in her lungs, lymph nodes, thyroid nodule. She will be followed up on a yearly basis and oncology. Reassurance. Overall, no chest pain other than mild GERD at night after dinner. No syncope, no change in shortness of breath.  05/16/15-baseline shortness of breath. She did go to the emergency room because of centralized chest discomfort. Troponin was normal, EKG unremarkable. Thankfully she started taking Zantac and this was improved.  11/12/15-reassuring thyroid tissue upon excision.  No cancer. Shortness of breath continues. Baseline. On Symbicort. COPD. Eliquis has been expensive. Trying to get assistance.  Aug 08, 2016 - AFIB, Jonne Ply) died. Needs toe repositioned. Right varicose vein. No significant shortness of breath. Occasionally she will still feel palpitations in the evening hours, around 4 PM. She has taken an extra Inderal at that time which seems to help area she recounted a story where she felt it racing, her grandson asked her if she wanted to go to dinner and she forgot about it and did not have any problems. Once again Dayna and Christell Faith are her neighbor.  05/21/17 - 12/30/16 hospitalization. Combination from heart and lungs. Weight set at 240 when left the hospital.  Now her base weight is 235.  She understands to take an extra Lasix if necessary.  She has been doing a good job on maintaining this weight.  She still is quite deconditioned, debilitated.  Her back and hip pain is playing a major role in this.  She is not having any significant chest discomfort.  Occasionally she will feel heart palpitations from her atrial fibrillation which is permanent.  She has not had any bleeding episodes.  When discussing her orthopedic issues, she understands that she would be high risk for any surgical procedures.  Whether this pertains to her toe or her back she understands.  Creat 1.1. Dr. Emiliano Dyer. Back. Prior stroke   Wt Readings from Last 3 Encounters:  05/21/17 238 lb 12.8 oz (108.3 kg)  01/06/17 241 lb 6.4 oz (109.5 kg)  01/01/17 243 lb  4.8 oz (110.4 kg)     Past Medical History:  Diagnosis Date  . Anginal pain ([place])   . Arthritis   . Atrial fibrillation ([place]) 07/2012   Eliquis, rate controlled  . Breast cancer ([place]) 2001   left mastectomy  . Coarse tremors    , Essential  . Complication of anesthesia    SMALLER TUBE FOR INTUBATION AS GAGS ON TUBE  . COPD (chronic obstructive pulmonary disease) ([place])   . CVA (cerebral vascular accident) ([place]) 2013  . Dysrhythmia  08/20/2012   Atrial Fibrillation  . Family history of adverse reaction to anesthesia    mother experienced pain with intubation   . GERD (gastroesophageal reflux disease)    history of   . Hard of hearing   . Heart murmur   . History of chemotherapy 11/13/1998 to 2010   Adriamycin/Docotaxol, Dexorubicin/Taxotere, Femara, Tamoxifen  . History of hiatal hernia    history of   . Hx of adenomatous colonic polyps 2006   , Benign  . Hypercholesteremia   . Hypertension    , With mild concentric left ventricular hypertrophy  . Mixed dyslipidemia   . Shingles   . Shortness of breath dyspnea    on exertion   . Spider veins   . Stroke ([place]) 11/17/2011   left side brain-speech-TPA  . Thyroid nodule   . Tremor, essential   . Valvular sclerosis 09/23/2003   without stenosis  . Varicose veins     Past Surgical History:  Procedure Laterality Date  . APPENDECTOMY  07/10/1957  . BIOPSY THYROID  12/01/2013   ultrasound  . BREAST SURGERY    . BUNIONECTOMY  11/29/1988   bilateral with hammer toes  . CARDIAC CATHETERIZATION  07/2010   , Without significant CAD  . CARDIAC CATHETERIZATION  07/2011   [name]  . CESAREAN SECTION    . CESAREAN SECTION  05/1968, 07/1965  . EYE SURGERY  01/31/2013   eye lid; cataract surgery bilat   . JOINT REPLACEMENT  05/2001   left knee  . JOINT REPLACEMENT  11/2001   right knee  . JOINT REPLACEMENT  01/2008   redo right knee  . KNEE SURGERY  2009   ,Total knee replacement  . LEFT HEART CATHETERIZATION WITH CORONARY ANGIOGRAM N/A 08/14/2011   Procedure: LEFT HEART CATHETERIZATION WITH CORONARY ANGIOGRAM;  Surgeon: [name], MD;  Location: [facility] CATH LAB;  Service: Cardiovascular;  Laterality: N/A;  . Left Rotator Cuff    . MASTECTOMY  10/19/1998   Radical, left -with lymph nodes (8 total)  . Reverse bunionectomy  08/29/1993   removal of Tibial Sesamoid-left  . TEE WITHOUT CARDIOVERSION  11/19/2011   Procedure: TRANSESOPHAGEAL ECHOCARDIOGRAM  (TEE);  Surgeon: [name], MD;  Location: [facility] ENDOSCOPY;  Service: Cardiovascular;  Laterality: N/A;  . THYROIDECTOMY N/A 03/02/2015   Procedure: TOTAL THYROIDECTOMY;  Surgeon: Armandina Gemma, MD;  Location: [facility] ORS;  Service: General;  Laterality: N/A;  . TONSILLECTOMY    . TOTAL HIP ARTHROPLASTY Left 10/11/2014   Procedure: LEFT TOTAL HIP ARTHROPLASTY ANTERIOR APPROACH;  Surgeon: [name], MD;  Location: [facility] ORS;  Service: Orthopedics;  Laterality: Left;    Current Outpatient Medications  Medication Sig Dispense Refill  . albuterol (PROVENTIL HFA;VENTOLIN HFA) 108 (90 Base) MCG/ACT inhaler Inhale 2 puffs into the lungs every 4 (four) hours as needed for wheezing or shortness of breath. 1 Inhaler 5  . amLODipine (NORVASC) 5 MG tablet Take 5 mg by mouth daily.    Marland Kitchen  apixaban (ELIQUIS) 5 MG TABS tablet Take 1 tablet (5 mg total) by mouth 2 (two) times daily. 180 tablet 3  . atorvastatin (LIPITOR) 20 MG tablet Take 10 mg by mouth daily after supper.     . fenofibrate 160 MG tablet Take 160 mg by mouth daily after supper.     . ferrous sulfate 325 (65 FE) MG EC tablet Take 325 mg by mouth daily after supper.     . fluticasone (FLONASE) 50 MCG/ACT nasal spray Place 2 sprays into both nostrils as needed for allergies or rhinitis.    Marland Kitchen ketoconazole (NIZORAL) 2 % cream pt uses 1 application topically daily as needed for rash  0  . levothyroxine (SYNTHROID, LEVOTHROID) 175 MCG tablet TAKE 1 TABLET BY MOUTH DAILY BEFORE BREAKFAST. 90 tablet 0  . losartan (COZAAR) 100 MG tablet Take 100 mg by mouth daily.    . Multiple Vitamin (MULTIVITAMIN WITH MINERALS) TABS tablet Take 1 tablet by mouth daily.    . propranolol ER (INDERAL LA) 60 MG 24 hr capsule TAKE 2 CAPS IN THE MORNING TAKE 1 CAP IN THE AFTERNOON, 1 CAP AT NIGHT 360 capsule 2  . Pseudoephedrine-Guaifenesin (MUCINEX D PO) Take 1 capsule by mouth daily.     . Tiotropium Bromide Monohydrate (SPIRIVA RESPIMAT) 2.5 MCG/ACT AERS Inhale 2 puffs into the  lungs daily. 1 Inhaler 3  . traMADol (ULTRAM) 50 MG tablet Take 50 mg by mouth every 8 (eight) hours as needed. for pain  0  . furosemide (LASIX) 20 MG tablet Take 1 tablet (20 mg total) by mouth daily. May take extra tablet daily as needed for weight gain 120 tablet 3  . potassium chloride (K-DUR) 10 MEQ tablet Take 1 tablet (10 mEq total) by mouth daily. Take extra tablet by mouth when taking extra furosemide 120 tablet 3   No current facility-administered medications for this visit.     Allergies:    Allergies  Allergen Reactions  . Floxin [Ofloxacin] Nausea Only and Other (See Comments)    Dizziness, Vertigo     Social History:  The patient  reports that she quit smoking about 25 years ago. Her smoking use included cigarettes. She has a 60.00 pack-year smoking history. she has never used smokeless tobacco. She reports that she drinks about 0.6 oz of alcohol per week. She reports that she does not use drugs.   ROS:  All other ROS negative  PHYSICAL EXAM: VS:  BP 132/76   Pulse 72   Ht 5' 6"  (1.676 m)   Wt 238 lb 12.8 oz (108.3 kg)   BMI 38.54 kg/m  GEN: Well nourished, well developed, in no acute distress , obese HEENT: normal  Neck: no JVD, carotid bruits, or masses Cardiac: irreg irreg normal rate; 2/6 SM,no rubs, or gallops,no edema  Respiratory:  clear to auscultation bilaterally, normal work of breathing GI: soft, nontender, nondistended, + BS MS: no deformity or atrophy  Skin: warm and dry, no rash Neuro:  Alert and Oriented x 3, Strength and sensation are intact Psych: euthymic mood, full affect    EKG: No new ECG. ECG from 12/2016 personally reviewed and AFIB 90. 07/31/14-76, atrial fibrillation-no significant change from prior. AFIB 77 No ST changes. Personally viewed  ECHO 06/10/16:  - Left ventricle: The cavity size was normal. Wall thickness was   normal. Systolic function was normal. The estimated ejection   fraction was in the range of 55% to 60%. Wall  motion was normal;   there  were no regional wall motion abnormalities. - Aortic valve: There was mild stenosis. There was trivial   regurgitation. - Mitral valve: Moderately calcified annulus. There was mild   regurgitation. - Left atrium: The atrium was severely dilated. - Right atrium: The atrium was moderately dilated. - Pulmonary arteries: Systolic pressure was mildly increased. PA   peak pressure: 46 mm Hg (S).  Impressions:  - Normal LV systolic function; biatrial enlargement; calcified   aortic valve with mild AS with mean gradient 10 mmHg and trace   AI; mild MR; mild TR with mildly elevated pulmonary pressure.  ASSESSMENT AND PLAN:  Permanent atrial fibrillation  - Currently well rate controlled. No change in Inderal.   - Occasionally will feel increased heart rate in the evening hours, late afternoon. She takes an extra Inderal if this happens. No changes made in regimen. Doing well.   History of stroke  - Increases her future stroke risk. Continue with chronic anticoagulation. Eliquis. No new symptoms.   Chronic anticoagulation  - She has not had any bleeding issues. Continue with Eliquis.  - Monitor basic metabolic profile and CBC. Checking today. No bruising.   GERD  - Zantac. Prior ER visit in January 2017. Reassurance. Prior cath reassuring.  Aortic atherosclerosis  - statin, HTN control.   Obesity, morbid (BMI >35 with 2 or more comorbidities)  - Continue to encourage weight loss.  Chronic diastolic heart failure  - She is watching her daily weights. They have been stable. 235.  She is taking Lasix 20 mg a day. Continue with medications as above.  - She feels like most of her symptoms have come from her heart. Encouraged exercise.   Secondary pulmonary hypertension  - from morbid obesity, Noted on ECHO. Tx HTN.   Varicose veins  - She has seen Dr. Oneida Alar in the past. No changes  Essential hypertension  - Doing well. Well controlled. Meds reviewed.     Grieving the loss of her husband, Clair Gulling  - She is very appreciative of Melina Copa and Thurmond Butts her neighbor who helped significantly. Still difficult for her at times.   Six-month follow-up with APP, 12 months with me  Signed, [name], MD [facility]  05/21/2017 10:19 AM

## 2017-05-21 NOTE — Patient Instructions (Signed)
Medication Instructions:  The current medical regimen is effective;  continue present plan and medications.  Labwork: Please have blood work today (CBC, BMP)  Follow-Up: Follow up in 6 months with Cecilie Kicks, NP.  You will receive a letter in the mail 2 months before you are due.  Please call us when you receive this letter to schedule your follow up appointment.  Follow up in 1 year with [name].  You will receive a letter in the mail 2 months before you are due.  Please call us when you receive this letter to schedule your follow up appointment.  If you need a refill on your cardiac medications before your next appointment, please call your pharmacy.  Thank you for choosing [facility]!!

## 2017-05-26 ENCOUNTER — Ambulatory Visit: Payer: Self-pay | Admitting: Pharmacist

## 2017-05-26 ENCOUNTER — Other Ambulatory Visit: Payer: Self-pay | Admitting: Pharmacist

## 2017-05-26 DIAGNOSIS — M545 Low back pain: Secondary | ICD-10-CM | POA: Diagnosis not present

## 2017-05-26 NOTE — Patient Outreach (Signed)
[facility]) Care Management  05/26/2017  Kristin Hood 05-05-1940 [id number]   Called patient to follow up on medication assistance. Unfortunately, she did not answer the phone. HIPAA compliant message was left on her voicemail.  In addition, the patient assistance letter was not sent to the patient due to my error. The letter for the application to be sent was delivered today.  In addition, [name], [name], offered to hand deliver the application to the patient if needed.  Plan: Patient will be called back tomorrow per protocol.  [name], PharmD, [place] Clinical Pharmacist [phone]

## 2017-05-27 ENCOUNTER — Other Ambulatory Visit: Payer: Self-pay | Admitting: Pharmacist

## 2017-05-27 ENCOUNTER — Ambulatory Visit: Payer: Self-pay | Admitting: Pharmacist

## 2017-05-27 NOTE — Patient Outreach (Signed)
[facility]) Care Management  05/27/2017  Kristin Hood 12/18/1940 [id number]   Patient was called regarding medication assistance.  Unfortunately, she did not answer the phone. HIPAA compliant message was left on the patient's voicemail.    [name], [place] Technician mailed the patient's medication assistance forms to her home on 05/26/17.    Plan:  Patient was sent an unsuccessful contact letter per referral.  Will call patient back in 10 days per referral and close case if I do not hear back from her.  [name], PharmD, [place] Clinical Pharmacist [phone]

## 2017-06-09 ENCOUNTER — Other Ambulatory Visit: Payer: Self-pay | Admitting: Pharmacy Technician

## 2017-06-09 NOTE — Patient Outreach (Signed)
[facility]) Care Management  06/09/2017  Kristin Hood [birth year] [id number]  Unsuccessful patient outreach call in reference to patient assistance application mailed 1/83/6725. A HIPAA compliant voicemail was left requesting for the patient to return my call.  [name], [place] [phone]

## 2017-06-10 ENCOUNTER — Other Ambulatory Visit: Payer: Self-pay | Admitting: Pharmacist

## 2017-06-10 ENCOUNTER — Ambulatory Visit: Payer: Self-pay | Admitting: Pharmacist

## 2017-06-10 NOTE — Patient Outreach (Addendum)
[facility]) Care Management  06/10/2017  Kristin Hood 1940/11/08 [id number]   Patient was called regarding medication assistance. Unfortunately, she did not answer her phone. HIPAA compliant message was left on her voicemail.  Since [facility] Pharmacist did not send over a message for the patient's application to be sent originally, the patient will be outreached again before closing the pharmacy case as the patient was very eager to receive medication assistance.  Plan:  Call patient back tomorrow per protocol.  [name], PharmD, BCACP [facility] Clinical Pharmacist [phone]  ADDENDUM:  Patient called back. HIPAA identifiers were obtained.  Patient reported she received the patient assistance paperwork we mailed to her. She also said she completed it and put it in the mail to come back to [place] yesterday.  Plan:  Patient will be followed by [place].  [name], PharmD, [place] Clinical Pharmacist [phone]

## 2017-06-11 ENCOUNTER — Ambulatory Visit: Payer: Self-pay | Admitting: Pharmacist

## 2017-06-15 DIAGNOSIS — M48061 Spinal stenosis, lumbar region without neurogenic claudication: Secondary | ICD-10-CM | POA: Diagnosis not present

## 2017-06-15 DIAGNOSIS — M1611 Unilateral primary osteoarthritis, right hip: Secondary | ICD-10-CM | POA: Diagnosis not present

## 2017-06-15 DIAGNOSIS — M7061 Trochanteric bursitis, right hip: Secondary | ICD-10-CM | POA: Diagnosis not present

## 2017-06-16 ENCOUNTER — Other Ambulatory Visit: Payer: Self-pay | Admitting: Pharmacy Technician

## 2017-06-16 NOTE — Patient Outreach (Signed)
[facility]) Care Management  06/16/2017  Kristin Hood Feb 17, 1941 [id number]  Successful patient outreach call in reference to [place] application that I received from the patient. HIPAA identifiers verified and verbal consent received. I informed the patient that the application was received and has been faxed to the provider to sign and submit a prescription for Spiriva Respimat. While looking over the documentation provided I noticed her proof of income does not have any of her information on it. I requested for her to send the documentation she received from Social Security that shows her benefit information. If the provider sends the application back before I receive the documentation from the patient I will go ahead and submit the application. This way the process will at least get started and the correct information can be sent later.  [name], [place] [phone]

## 2017-06-22 ENCOUNTER — Other Ambulatory Visit: Payer: Self-pay | Admitting: Pharmacist

## 2017-06-22 NOTE — Patient Outreach (Signed)
[facility]) Care Management  06/22/2017  Kristin Hood 03/26/1941 [id number]   Patient called to discuss the Social Security document needed for her patient assistance application.  Patient said she had her daughter attempt to fax the letter but she could not get it to go through. The [facility] fax number was reviewed with the patient.  Plan: Await a fax.  Call patient back within 24 hours and let her know if we received the fax.  [name], PharmD, [place] Clinical Pharmacist [phone]

## 2017-06-23 ENCOUNTER — Ambulatory Visit: Payer: Self-pay | Admitting: Pharmacist

## 2017-06-23 DIAGNOSIS — M7061 Trochanteric bursitis, right hip: Secondary | ICD-10-CM | POA: Diagnosis not present

## 2017-06-25 ENCOUNTER — Other Ambulatory Visit: Payer: Self-pay | Admitting: Pharmacist

## 2017-06-25 NOTE — Patient Outreach (Signed)
[facility]) Care Management  06/25/2017  Kristin Hood [birth year] [id number]   Patient faxed her Social Security Benefits letter to accompany her application. Patient was called to let her know it was received. HIPAA identifiers were obtained.  Patient was notified that we received her financial paperwork.  The physician portion of the application has been sent but has not been returned.    Plan: Route note to [name], CPhT to check on the physician's portion of the application.  Follow up in 2 weeks.  [name], PharmD, [place] Clinical Pharmacist [phone]

## 2017-06-26 ENCOUNTER — Other Ambulatory Visit: Payer: Self-pay | Admitting: Pharmacy Technician

## 2017-06-26 NOTE — Patient Outreach (Signed)
[name]) Care Management  06/26/2017  Kristin Hood 05/30/1940 [id number]   Sent in-basket message to Fonnie Birkenhead CMA at Dr. Sudie Bailey office in reference [place] provider portion of application. In message stated that we had faxed app in 3 times and needed someone to verify that it was received.  [name] [place], [place] Management [phone]

## 2017-07-06 DIAGNOSIS — M7061 Trochanteric bursitis, right hip: Secondary | ICD-10-CM | POA: Diagnosis not present

## 2017-07-07 ENCOUNTER — Other Ambulatory Visit: Payer: Self-pay | Admitting: Pharmacy Technician

## 2017-07-07 NOTE — Patient Outreach (Signed)
[facility]) Care Management  07/07/2017  BERNA GITTO 22-Jun-1940 [id number]   Received in-basket from Venetia Constable, [place] in regards to patients application for Spiriva Respimat. Mendel Ryder stated that Dr. [name] filled out application and faxed to company last week.  Will follow up with [employer] in the next week  [name]. [place], [place] Management [phone]

## 2017-07-09 ENCOUNTER — Ambulatory Visit: Payer: Self-pay | Admitting: Pharmacist

## 2017-07-14 ENCOUNTER — Ambulatory Visit
Admission: RE | Admit: 2017-07-14 | Discharge: 2017-07-14 | Disposition: A | Discharge status: Home / Self Care | Payer: PPO | Source: Ambulatory Visit | Attending: Internal Medicine | Admitting: Internal Medicine

## 2017-07-14 DIAGNOSIS — R591 Generalized enlarged lymph nodes: Secondary | ICD-10-CM

## 2017-07-14 DIAGNOSIS — L509 Urticaria, unspecified: Secondary | ICD-10-CM | POA: Diagnosis not present

## 2017-07-14 DIAGNOSIS — R161 Splenomegaly, not elsewhere classified: Secondary | ICD-10-CM | POA: Diagnosis not present

## 2017-07-14 DIAGNOSIS — N281 Cyst of kidney, acquired: Secondary | ICD-10-CM | POA: Diagnosis not present

## 2017-07-14 DIAGNOSIS — C50912 Malignant neoplasm of unspecified site of left female breast: Secondary | ICD-10-CM | POA: Diagnosis not present

## 2017-07-14 MED ORDER — IOPAMIDOL (ISOVUE-300) INJECTION 61%
125.0000 mL | Freq: Once | INTRAVENOUS | Status: AC | PRN
  Administered 2017-07-14: 125 mL via INTRAVENOUS

## 2017-07-17 ENCOUNTER — Inpatient Hospital Stay: Payer: PPO | Attending: Internal Medicine

## 2017-07-17 DIAGNOSIS — I517 Cardiomegaly: Secondary | ICD-10-CM | POA: Diagnosis not present

## 2017-07-17 DIAGNOSIS — Z853 Personal history of malignant neoplasm of breast: Secondary | ICD-10-CM | POA: Diagnosis not present

## 2017-07-17 DIAGNOSIS — K8020 Calculus of gallbladder without cholecystitis without obstruction: Secondary | ICD-10-CM | POA: Diagnosis not present

## 2017-07-17 DIAGNOSIS — Z79899 Other long term (current) drug therapy: Secondary | ICD-10-CM | POA: Insufficient documentation

## 2017-07-17 DIAGNOSIS — Z9012 Acquired absence of left breast and nipple: Secondary | ICD-10-CM | POA: Diagnosis not present

## 2017-07-17 DIAGNOSIS — I4891 Unspecified atrial fibrillation: Secondary | ICD-10-CM | POA: Diagnosis not present

## 2017-07-17 DIAGNOSIS — J449 Chronic obstructive pulmonary disease, unspecified: Secondary | ICD-10-CM | POA: Insufficient documentation

## 2017-07-17 DIAGNOSIS — K219 Gastro-esophageal reflux disease without esophagitis: Secondary | ICD-10-CM | POA: Diagnosis not present

## 2017-07-17 DIAGNOSIS — K449 Diaphragmatic hernia without obstruction or gangrene: Secondary | ICD-10-CM | POA: Insufficient documentation

## 2017-07-17 DIAGNOSIS — K5730 Diverticulosis of large intestine without perforation or abscess without bleeding: Secondary | ICD-10-CM | POA: Diagnosis not present

## 2017-07-17 DIAGNOSIS — Z9221 Personal history of antineoplastic chemotherapy: Secondary | ICD-10-CM | POA: Diagnosis not present

## 2017-07-17 DIAGNOSIS — E782 Mixed hyperlipidemia: Secondary | ICD-10-CM | POA: Diagnosis not present

## 2017-07-17 DIAGNOSIS — I119 Hypertensive heart disease without heart failure: Secondary | ICD-10-CM | POA: Diagnosis not present

## 2017-07-17 DIAGNOSIS — Z85850 Personal history of malignant neoplasm of thyroid: Secondary | ICD-10-CM | POA: Insufficient documentation

## 2017-07-17 DIAGNOSIS — G250 Essential tremor: Secondary | ICD-10-CM | POA: Insufficient documentation

## 2017-07-17 DIAGNOSIS — R591 Generalized enlarged lymph nodes: Secondary | ICD-10-CM

## 2017-07-17 LAB — COMPREHENSIVE METABOLIC PANEL
ALK PHOS: 27 U/L — AB (ref 40–150)
ALT: 15 U/L (ref 0–55)
AST: 16 U/L (ref 5–34)
Albumin: 3.3 g/dL — ABNORMAL LOW (ref 3.5–5.0)
Anion gap: 6 (ref 3–11)
BILIRUBIN TOTAL: 0.7 mg/dL (ref 0.2–1.2)
BUN: 32 mg/dL — ABNORMAL HIGH (ref 7–26)
CALCIUM: 9.2 mg/dL (ref 8.4–10.4)
CO2: 28 mmol/L (ref 22–29)
CREATININE: 1.08 mg/dL (ref 0.60–1.10)
Chloride: 104 mmol/L (ref 98–109)
GFR calc Af Amer: 56 mL/min — ABNORMAL LOW (ref >60)
GFR calc non Af Amer: 48 mL/min — ABNORMAL LOW (ref >60)
Glucose, Bld: 93 mg/dL (ref 70–140)
Potassium: 4.6 mmol/L (ref 3.5–5.1)
SODIUM: 138 mmol/L (ref 136–145)
TOTAL PROTEIN: 7.3 g/dL (ref 6.4–8.3)

## 2017-07-17 LAB — LACTATE DEHYDROGENASE: LDH: 173 U/L (ref 125–245)

## 2017-07-21 ENCOUNTER — Encounter: Payer: Self-pay | Admitting: Internal Medicine

## 2017-07-21 ENCOUNTER — Inpatient Hospital Stay: Payer: PPO | Admitting: Internal Medicine

## 2017-07-21 ENCOUNTER — Telehealth: Payer: Self-pay | Admitting: Internal Medicine

## 2017-07-21 ENCOUNTER — Other Ambulatory Visit: Payer: Self-pay | Admitting: Pharmacy Technician

## 2017-07-21 ENCOUNTER — Telehealth: Payer: Self-pay | Admitting: Emergency Medicine

## 2017-07-21 VITALS — BP 128/59 | HR 84 | Temp 97.6°F | Resp 18 | Ht 66.0 in | Wt 234.5 lb

## 2017-07-21 DIAGNOSIS — Z9012 Acquired absence of left breast and nipple: Secondary | ICD-10-CM | POA: Diagnosis not present

## 2017-07-21 DIAGNOSIS — Z9221 Personal history of antineoplastic chemotherapy: Secondary | ICD-10-CM | POA: Diagnosis not present

## 2017-07-21 DIAGNOSIS — Z85850 Personal history of malignant neoplasm of thyroid: Secondary | ICD-10-CM | POA: Diagnosis not present

## 2017-07-21 DIAGNOSIS — I509 Heart failure, unspecified: Secondary | ICD-10-CM

## 2017-07-21 DIAGNOSIS — Z79899 Other long term (current) drug therapy: Secondary | ICD-10-CM | POA: Diagnosis not present

## 2017-07-21 DIAGNOSIS — Z853 Personal history of malignant neoplasm of breast: Secondary | ICD-10-CM | POA: Diagnosis not present

## 2017-07-21 DIAGNOSIS — R591 Generalized enlarged lymph nodes: Secondary | ICD-10-CM

## 2017-07-21 NOTE — Patient Outreach (Signed)
[facility]) Care Management  07/21/2017  Kristin Hood February 11, 1941 [id number]   Contacted Dr. Sudie Bailey office in reference to patients [employer] application for Spiriva Respimat. I was told by CMA Venetia Constable on 04/09 that Dr. [name] had filled out application and faxed it in to the company the week prior. I have made numerous calls to company to verify the receipt of the application and was informed they had not received it. I called again today and was told the same.  Left message with [place] to have a nurse contact me in regards to this issue.  [name] [place], [place] Management [phone]

## 2017-07-21 NOTE — Patient Outreach (Addendum)
[facility] [facility]) Care Management  07/21/2017  Kristin Hood April 29, 1940 [id number]   Incoming call from [place] at Dr. Sudie Bailey office. Informed her that [employer] states they have not received patient application for Spiriva Respimat. I requested that I fax the application to them to have Dr. [name] sign again and that the fax back to me versus faxing to the company. [name] stated that would be fine and had me write "Attention Ria Comment" on fax. [name] also informed me that Dr. [name] wouldn't be back in office until Monday 04/29.  [name] [place], [place] Management [phone]

## 2017-07-21 NOTE — Progress Notes (Signed)
[facility] [phone]   [phone]  OFFICE PROGRESS NOTE  [name], MD [place] [address]  DIAGNOSIS: Persistent lymphadenopathy in the mediastinum and retroperitoneum as well as splenomegaly suspicious for lymphoproliferative disorder but has been stable for the last 36 months. 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  PRIOR THERAPY: None.  CURRENT THERAPY: Observation.  INTERVAL HISTORY: Kristin Hood 77 y.o. female returns to the clinic today for annual follow-up visit accompanied by her daughter [name].  The patient is feeling fine today with no specific complaints.  She denied having any chest pain, shortness of breath, cough or hemoptysis.  She denied having any recent weight loss or night sweats.  She has no nausea, vomiting, diarrhea or constipation.  She has no headache or visual changes.  The patient had repeat CT scan of the chest, abdomen and pelvis performed recently and she is here for evaluation and discussion of her scan results.  MEDICAL HISTORY: Past Medical History:  Diagnosis Date  . Anginal pain ([place])   . Arthritis   . Atrial fibrillation ([place]) 07/2012   Eliquis, rate controlled  . Breast cancer ([place]) 2001   left mastectomy  . Coarse tremors    , Essential  . Complication of anesthesia    SMALLER TUBE FOR INTUBATION AS GAGS ON TUBE  . COPD (chronic obstructive pulmonary disease) ([place])   . CVA (cerebral vascular accident) ([place]) 2013  . Dysrhythmia 08/20/2012   Atrial Fibrillation  . Family history of adverse reaction to anesthesia    mother experienced pain with intubation   . GERD (gastroesophageal reflux disease)    history of   . Hard of hearing   . Heart murmur   . History of chemotherapy 11/13/1998 to 2010   Adriamycin/Docotaxol, Dexorubicin/Taxotere, Femara, Tamoxifen  . History of  hiatal hernia    history of   . Hx of adenomatous colonic polyps 2006   , Benign  . Hypercholesteremia   . Hypertension    , With mild concentric left ventricular hypertrophy  . Mixed dyslipidemia   . Shingles   . Shortness of breath dyspnea    on exertion   . Spider veins   . Stroke ([place]) 11/17/2011   left side brain-speech-TPA  . Thyroid nodule   . Tremor, essential   . Valvular sclerosis 09/23/2003   without stenosis  . Varicose veins     ALLERGIES:  is allergic to floxin [ofloxacin].  MEDICATIONS:  Current Outpatient Medications  Medication Sig Dispense Refill  . amLODipine (NORVASC) 5 MG tablet Take 5 mg by mouth daily.    Marland Kitchen apixaban (ELIQUIS) 5 MG TABS tablet Take 1 tablet (5 mg total) by mouth 2 (two) times daily. 180 tablet 3  . atorvastatin (LIPITOR) 20 MG tablet Take 10 mg by mouth daily after supper.     . fenofibrate 160 MG tablet Take 160 mg by mouth daily after supper.     . ferrous sulfate 325 (65 FE) MG EC tablet Take 325 mg by mouth daily after supper.     . fluticasone (FLONASE) 50 MCG/ACT nasal spray Place 2 sprays into both nostrils as needed for allergies or rhinitis.    Marland Kitchen ketoconazole (NIZORAL) 2 % cream pt uses 1 application topically daily as needed for rash  0  . levothyroxine (SYNTHROID, LEVOTHROID) 175 MCG tablet TAKE 1 TABLET BY MOUTH DAILY  BEFORE BREAKFAST. 90 tablet 0  . losartan (COZAAR) 100 MG tablet Take 100 mg by mouth daily.    . Multiple Vitamin (MULTIVITAMIN WITH MINERALS) TABS tablet Take 1 tablet by mouth daily.    . propranolol ER (INDERAL LA) 60 MG 24 hr capsule TAKE 2 CAPS IN THE MORNING TAKE 1 CAP IN THE AFTERNOON, 1 CAP AT NIGHT 360 capsule 2  . albuterol (PROVENTIL HFA;VENTOLIN HFA) 108 (90 Base) MCG/ACT inhaler Inhale 2 puffs into the lungs every 4 (four) hours as needed for wheezing or shortness of breath. (Patient not taking: Reported on 07/21/2017) 1 Inhaler 5  . furosemide (LASIX) 20 MG tablet Take 1 tablet (20 mg total) by mouth  daily. May take extra tablet daily as needed for weight gain 120 tablet 3  . potassium chloride (K-DUR) 10 MEQ tablet Take 1 tablet (10 mEq total) by mouth daily. Take extra tablet by mouth when taking extra furosemide 120 tablet 3  . Pseudoephedrine-Guaifenesin (MUCINEX D PO) Take 1 capsule by mouth daily.     . Tiotropium Bromide Monohydrate (SPIRIVA RESPIMAT) 2.5 MCG/ACT AERS Inhale 2 puffs into the lungs daily. (Patient not taking: Reported on 07/21/2017) 1 Inhaler 3  . traMADol (ULTRAM) 50 MG tablet Take 50 mg by mouth every 8 (eight) hours as needed. for pain  0   No current facility-administered medications for this visit.     SURGICAL HISTORY:  Past Surgical History:  Procedure Laterality Date  . APPENDECTOMY  07/10/1957  . BIOPSY THYROID  12/01/2013   ultrasound  . BREAST SURGERY    . BUNIONECTOMY  11/29/1988   bilateral with hammer toes  . CARDIAC CATHETERIZATION  07/2010   , Without significant CAD  . CARDIAC CATHETERIZATION  07/2011   [name]  . CESAREAN SECTION    . CESAREAN SECTION  05/1968, 07/1965  . EYE SURGERY  01/31/2013   eye lid; cataract surgery bilat   . JOINT REPLACEMENT  05/2001   left knee  . JOINT REPLACEMENT  11/2001   right knee  . JOINT REPLACEMENT  01/2008   redo right knee  . KNEE SURGERY  2009   ,Total knee replacement  . LEFT HEART CATHETERIZATION WITH CORONARY ANGIOGRAM N/A 08/14/2011   Procedure: LEFT HEART CATHETERIZATION WITH CORONARY ANGIOGRAM;  Surgeon: [name], MD;  Location[facility] C[facility] LAB;  Service: Cardiovascular;  Laterality: N/A;  . Left Rotator Cuff    . MASTECTOMY  10/19/1998   Radical, left -with lymph nodes (8 total)  . Reverse bunionectomy  08/29/1993   removal of Tibial Sesamoid-left  . TEE WITHOUT CARDIOVERSION  11/19/2011   Procedure: TRANSESOPHAGEAL ECHOCARDIOGRAM (TEE);  Surgeon: [name], MD;  Location: [facility] ENDOSCOPY;  Service: Cardiovascular;  Laterality: N/A;  . THYROIDECTOMY N/A 03/02/2015   Procedure: TOTAL  THYROIDECTOMY;  Surgeon: Armandina Gemma, MD;  Location: [facility] ORS;  Service: General;  Laterality: N/A;  . TONSILLECTOMY    . TOTAL HIP ARTHROPLASTY Left 10/11/2014   Procedure: LEFT TOTAL HIP ARTHROPLASTY ANTERIOR APPROACH;  Surgeon: [name], MD;  Location: [facility] ORS;  Service: Orthopedics;  Laterality: Left;    REVIEW OF SYSTEMS:  Constitutional: negative Eyes: negative Ears, nose, mouth, throat, and face: negative Respiratory: negative Cardiovascular: negative Gastrointestinal: negative Genitourinary:negative Integument/breast: negative Hematologic/lymphatic: negative Musculoskeletal:negative Neurological: negative Behavioral/Psych: negative Endocrine: negative Allergic/Immunologic: negative   PHYSICAL EXAMINATION: General appearance: alert, cooperative, fatigued and no distress Head: Normocephalic, without obvious abnormality, atraumatic Neck: no adenopathy, no JVD, supple, symmetrical, trachea midline and thyroid not enlarged, symmetric,  no tenderness/mass/nodules Lymph nodes: Cervical, supraclavicular, and axillary nodes normal. Resp: clear to auscultation bilaterally Back: symmetric, no curvature. ROM normal. No CVA tenderness. Cardio: regular rate and rhythm, S1, S2 normal, no murmur, click, rub or gallop GI: soft, non-tender; bowel sounds normal; no masses,  no organomegaly Extremities: extremities normal, atraumatic, no cyanosis or edema Neurologic: Alert and oriented X 3, normal strength and tone. Normal symmetric reflexes. Normal coordination and gait  ECOG PERFORMANCE STATUS: 1 - Symptomatic but completely ambulatory  Blood pressure (!) 128/59, pulse 84, temperature 97.6 F (36.4 C), temperature source Oral, resp. rate 18, height 5\' 6"  (1.676 m), weight 234 lb 8 oz (106.4 kg), SpO2 99 %.  LABORATORY DATA: Lab Results  Component Value Date   WBC 4.2 05/21/2017   HGB 11.3 05/21/2017   HCT 35.0 05/21/2017   MCV 88 05/21/2017   PLT 325 05/21/2017      Chemistry       Component Value Date/Time   NA 138 07/17/2017 0830   NA 142 05/21/2017 1027   NA 141 07/15/2016 0906   K 4.6 07/17/2017 0830   K 4.0 07/15/2016 0906   CL 104 07/17/2017 0830   CO2 28 07/17/2017 0830   CO2 25 07/15/2016 0906   BUN 32 (H) 07/17/2017 0830   BUN 33 (H) 05/21/2017 1027   BUN 20.7 07/15/2016 0906   CREATININE 1.08 07/17/2017 0830   CREATININE 1.1 07/15/2016 0906      Component Value Date/Time   CALCIUM 9.2 07/17/2017 0830   CALCIUM 9.6 07/15/2016 0906   ALKPHOS 27 (L) 07/17/2017 0830   ALKPHOS 29 (L) 07/15/2016 0906   AST 16 07/17/2017 0830   AST 19 07/15/2016 0906   ALT 15 07/17/2017 0830   ALT 13 07/15/2016 0906   BILITOT 0.7 07/17/2017 0830   BILITOT 1.13 07/15/2016 0906       RADIOGRAPHIC STUDIES: Ct Chest W Contrast  Result Date: 07/14/2017 CLINICAL DATA:  Left breast cancer, status post mastectomy. History of thyroid cancer. Lymphadenopathy. Restaging. Prior appendectomy. EXAM: CT CHEST, ABDOMEN, AND PELVIS WITH CONTRAST TECHNIQUE: Multidetector CT imaging of the chest, abdomen and pelvis was performed following the standard protocol during bolus administration of intravenous contrast. CONTRAST:  168mL ISOVUE-300 IOPAMIDOL (ISOVUE-300) INJECTION 61% Creatinine was obtained on site at [facility] at [address] Results: Creatinine 0.9 mg/dL. COMPARISON:  07/15/2016 FINDINGS: CT CHEST FINDINGS Cardiovascular: Cardiomegaly.  No pericardial effusion. No evidence of thoracic aortic aneurysm. Mild atherosclerotic calcifications of the aortic arch. Three vessel coronary atherosclerosis. Mediastinum/Nodes: Mild progressive thoracic lymphadenopathy, including: --Lymph nodes at the bilateral thoracic inlet measuring up to 11 mm (series 14/image 22), previously 9 mm --11 mm short axis right paratracheal node (series 14/image 49), grossly unchanged --2.5 cm subcarinal node (series 14/image 67), previously 18 mm --2.6 cm short axis low distal right  paraesophageal node (series 14/image 105), previously 2.4 cm Status post thyroidectomy. Lungs/Pleura: Mosaic attenuation with ground-glass opacities in a central/perihilar distribution, present but more conspicuous than on the prior study. This appearance may reflect interstitial lung disease such as chronic hypersensitivity pneumonitis or possibly drug toxicity. Stable 3 mm lingular nodule (series 4/image 63), benign. Mild linear scarring/atelectasis in the left lower lobe. No focal consolidation. No pleural effusion or pneumothorax. Musculoskeletal: Status post left mastectomy. Mild degenerative changes of the lower thoracic spine. CT ABDOMEN PELVIS FINDINGS Hepatobiliary: Mildly nodular hepatic contour. No suspicious hepatic lesions. Layering gallstones, without associated inflammatory changes. No intrahepatic or extrahepatic ductal dilatation. Pancreas: Within normal  limits. Spleen: Enlarged, measuring 16.4 cm in maximal craniocaudal dimension. Adrenals/Urinary Tract: Adrenal glands are within normal limits. 11 mm lateral interpolar right renal cyst (series 11/image 19). Left kidney is within normal limits. Bladder is within normal limits. Stomach/Bowel: Stomach is within normal limits. No evidence of bowel obstruction. Appendix is not discretely visualized. Sigmoid diverticulosis, without evidence of diverticulitis. Vascular/Lymphatic: No evidence of abdominal aortic aneurysm. Atherosclerotic calcifications of the abdominal aorta and branch vessels. Upper abdominal/retroperitoneal lymphadenopathy, including: --1.6 cm short axis portacaval node (series 14/image 161), previously 1.7 cm --2.9 cm short axis left para-aortic node (series 14/image 23), previously 2.3 cm when measured in a similar fashion --1.6 cm short axis aortocaval node (series 14/image 197), previously 1.4 cm Reproductive: Uterus is notable for probable calcified fibroids. Bilateral ovaries are within normal limits. Other: No abdominopelvic  ascites. Musculoskeletal: Visualized osseous structures are within normal limits. IMPRESSION: Mild progression of thoracoabdominal lymphadenopathy, favoring lymphoma. Splenomegaly. Ground-glass opacity in the central lungs bilaterally, favoring chronic lung disease, possibly chronic hypersensitivity pneumonitis or drug toxicity. Status post thyroidectomy and left mastectomy. Electronically Signed   By: Julian Hy M.D.   On: 07/14/2017 17:19   Ct Abdomen Pelvis W Contrast  Result Date: 07/14/2017 CLINICAL DATA:  Left breast cancer, status post mastectomy. History of thyroid cancer. Lymphadenopathy. Restaging. Prior appendectomy. EXAM: CT CHEST, ABDOMEN, AND PELVIS WITH CONTRAST TECHNIQUE: Multidetector CT imaging of the chest, abdomen and pelvis was performed following the standard protocol during bolus administration of intravenous contrast. CONTRAST:  119mL ISOVUE-300 IOPAMIDOL (ISOVUE-300) INJECTION 61% Creatinine was obtained on site at [facility] at [address] Results: Creatinine 0.9 mg/dL. COMPARISON:  07/15/2016 FINDINGS: CT CHEST FINDINGS Cardiovascular: Cardiomegaly.  No pericardial effusion. No evidence of thoracic aortic aneurysm. Mild atherosclerotic calcifications of the aortic arch. Three vessel coronary atherosclerosis. Mediastinum/Nodes: Mild progressive thoracic lymphadenopathy, including: --Lymph nodes at the bilateral thoracic inlet measuring up to 11 mm (series 14/image 22), previously 9 mm --11 mm short axis right paratracheal node (series 14/image 49), grossly unchanged --2.5 cm subcarinal node (series 14/image 67), previously 18 mm --2.6 cm short axis low distal right paraesophageal node (series 14/image 105), previously 2.4 cm Status post thyroidectomy. Lungs/Pleura: Mosaic attenuation with ground-glass opacities in a central/perihilar distribution, present but more conspicuous than on the prior study. This appearance may reflect interstitial lung disease such as  chronic hypersensitivity pneumonitis or possibly drug toxicity. Stable 3 mm lingular nodule (series 4/image 63), benign. Mild linear scarring/atelectasis in the left lower lobe. No focal consolidation. No pleural effusion or pneumothorax. Musculoskeletal: Status post left mastectomy. Mild degenerative changes of the lower thoracic spine. CT ABDOMEN PELVIS FINDINGS Hepatobiliary: Mildly nodular hepatic contour. No suspicious hepatic lesions. Layering gallstones, without associated inflammatory changes. No intrahepatic or extrahepatic ductal dilatation. Pancreas: Within normal limits. Spleen: Enlarged, measuring 16.4 cm in maximal craniocaudal dimension. Adrenals/Urinary Tract: Adrenal glands are within normal limits. 11 mm lateral interpolar right renal cyst (series 11/image 19). Left kidney is within normal limits. Bladder is within normal limits. Stomach/Bowel: Stomach is within normal limits. No evidence of bowel obstruction. Appendix is not discretely visualized. Sigmoid diverticulosis, without evidence of diverticulitis. Vascular/Lymphatic: No evidence of abdominal aortic aneurysm. Atherosclerotic calcifications of the abdominal aorta and branch vessels. Upper abdominal/retroperitoneal lymphadenopathy, including: --1.6 cm short axis portacaval node (series 14/image 161), previously 1.7 cm --2.9 cm short axis left para-aortic node (series 14/image 23), previously 2.3 cm when measured in a similar fashion --1.6 cm short axis aortocaval node (series 14/image 197), previously 1.4  cm Reproductive: Uterus is notable for probable calcified fibroids. Bilateral ovaries are within normal limits. Other: No abdominopelvic ascites. Musculoskeletal: Visualized osseous structures are within normal limits. IMPRESSION: Mild progression of thoracoabdominal lymphadenopathy, favoring lymphoma. Splenomegaly. Ground-glass opacity in the central lungs bilaterally, favoring chronic lung disease, possibly chronic hypersensitivity  pneumonitis or drug toxicity. Status post thyroidectomy and left mastectomy. Electronically Signed   By: Julian Hy M.D.   On: 07/14/2017 17:19    ASSESSMENT AND PLAN:  This is a very pleasant 77 years old white female with persistent lymphadenopathy as well as mild hepatosplenomegaly suspicious for low-grade lymphoma. The patient is current on observation and she is feeling fine.  Her recent CT scan of the chest, abdomen and pelvis showed slight increase in the lymphadenopathy as well as the splenomegaly.  I had a lengthy discussion with the patient and her daughter about her current condition and treatment options.  I gave the patient the option of proceeding with CT-guided core biopsy of 1 of the abdominal lymphadenopathy for confirmation of the diagnosis followed by discussion of treatment versus continuous observation and close monitoring.  The patient would like to continue on observation for now.  I will see her back for follow-up visit in 6 months for evaluation after repeating CT scan of the chest, abdomen and pelvis for restaging of her disease. She was advised to call immediately if she has any concerning symptoms in the interval. The patient voices understanding of current disease status and treatment options and is in agreement with the current care plan. All questions were answered. The patient knows to call the clinic with any problems, questions or concerns. We can certainly see the patient much sooner if necessary. I spent 15 minutes counseling the patient face to face. The total time spent in the appointment was 25 minutes.  Disclaimer: This note was dictated with voice recognition software. Similar sounding words can inadvertently be transcribed and may not be corrected upon review.

## 2017-07-21 NOTE — Telephone Encounter (Signed)
Appointments scheduled AVS/Calendar printed per 4/23 los °

## 2017-07-21 NOTE — Telephone Encounter (Signed)
Spoke with [name] at [facility], states that she has been following up on [employer] patient assistance forms, and although they have been faxed by our office per [name], [place] states they still have not received the forms.  [name] is refaxing forms to have RB sign- wants these forms signed and faxed back to her so she can follow up on the forms.  Advised that RB is not in clinic until next week- this is ok.  Will hold message in triage until BI forms have been received.

## 2017-07-21 NOTE — Telephone Encounter (Signed)
Spoke to patient regarding voicemail she left earlier about r/s lab appointment in October.

## 2017-07-24 NOTE — Telephone Encounter (Signed)
BI forms located in RB's blue 'sign' folder Routing message to Ria Comment and Dr [name] to ensure follow up

## 2017-07-27 NOTE — Telephone Encounter (Signed)
Done today.

## 2017-07-27 NOTE — Telephone Encounter (Signed)
Forms have been given to Dr. [name]. Will update chart once they are returned to me.

## 2017-07-27 NOTE — Telephone Encounter (Addendum)
Forms will be faxed back to [facility]

## 2017-07-28 ENCOUNTER — Other Ambulatory Visit: Payer: Self-pay

## 2017-07-28 ENCOUNTER — Telehealth: Payer: Self-pay | Admitting: *Deleted

## 2017-07-28 MED ORDER — TIOTROPIUM BROMIDE MONOHYDRATE 2.5 MCG/ACT IN AERS: 2.0000 | INHALATION_SPRAY | Freq: Every day | RESPIRATORY_TRACT | 11 refills | Status: DC

## 2017-07-28 NOTE — Telephone Encounter (Signed)
-----   Message from Ardeen Garland, RN sent at 07/28/2017 12:18 PM EDT ----- Regarding: FW: Pt email   ----- Message ----- From: Curt Bears, MD Sent: 07/28/2017   9:49 AM To: Ardeen Garland, RN Subject: RE: Pt email                                   Yes it could be related to her disease.  I recommended biopsy for confirmation and she will need to make a decision.  Thank you ----- Message ----- From: Ardeen Garland, RN Sent: 07/28/2017   8:58 AM To: Curt Bears, MD Subject: Pt email                                       Dr. [name], You mentioned night sweats, which I do have often. Something else that Circle and I did not think to mention to you was that since last October I have had several flare ups of a rash. I will feel itchy somewhere and then get a wipe that will itch and if I rub or scratch will get red over a larger area. Itch cream will help and any one spot doesn't last more that a few hours. It has never been visible long enough for the dermatologist to see. They mostly appear on my back, upper legs and arms.  Could this be a warning sign?   Kristin Hood  Anything I need to do? [name]

## 2017-07-29 ENCOUNTER — Telehealth: Payer: Self-pay | Admitting: Medical Oncology

## 2017-07-29 NOTE — Telephone Encounter (Signed)
I told pt that Dr [name] recommended biopsy. She said the rash is gone and she has night sweats 2 x week.

## 2017-08-06 ENCOUNTER — Other Ambulatory Visit: Payer: Self-pay | Admitting: Pharmacist

## 2017-08-07 ENCOUNTER — Telehealth: Payer: Self-pay | Admitting: Pharmacist

## 2017-08-07 ENCOUNTER — Telehealth: Payer: Self-pay | Admitting: Emergency Medicine

## 2017-08-07 ENCOUNTER — Other Ambulatory Visit: Payer: Self-pay | Admitting: Pharmacy Technician

## 2017-08-07 NOTE — Patient Outreach (Signed)
[facility]) Care Management  08/07/2017  MATY ZEISLER May 09, 1940 [id number]   Called patient and let her know the update from [place] denied her application because the prescription and application were sent separately.  [place] reached back out to Dr. Agustina Caroli office and requested they send the prescription to [place] and we will fax everything back.  Plan: Follow up in 7-10 business days.  [name], PharmD, [place] Clinical Pharmacist [phone]

## 2017-08-07 NOTE — Telephone Encounter (Signed)
Pt has now came in to the lobby to check and see if we have any samples?

## 2017-08-07 NOTE — Patient Outreach (Signed)
[name]) Care Management  08/07/2017  Kristin Hood Sep 08, 1940 [id number]   Contacted [employer] to follow up on patients Spiriva application. Spoke to [employer] who stated that because the prescription and application were faxed in a separate times the patient was denied. They require all info to be submitted together. I sent in basket to Dr. Sudie Bailey nurse [name] requesting they fax the script into me so I can fax full application.  Will contact Dr. Sudie Bailey office by phone on Monday if message not responded to be the end of the day.  [name] [place], [place] Management [phone]

## 2017-08-07 NOTE — Patient Outreach (Signed)
[facility] [facility]) Care Management  08/07/2017  Kristin Hood 1940/04/14 [id number]  Patient called to inquire about the status of her Spiriva application.  HIPAA identifiers were obtained. From chart review and a conversation with Kristin Hood, Kristin Hood, the patient's Spiriva application was sent to [place] but Dr. Agustina Caroli office did not send the prescription back with the application to our office.  Kristin Hood will reach out to [employer] to see if the provider sent the application to them.  Several attempts were made to reach out to Dr. Agustina Caroli office.  Patient has been in process since February.  Plan: Follow up with the patient after  Kristin Hood, PharmD, [place] Clinical Pharmacist [phone]

## 2017-08-07 NOTE — Telephone Encounter (Signed)
Called and spoke with pt letting her know we did not have any samples of Spiriva.  Pt expressed understanding. Nothing further needed at this time.

## 2017-08-10 ENCOUNTER — Telehealth: Payer: Self-pay | Admitting: Emergency Medicine

## 2017-08-10 ENCOUNTER — Other Ambulatory Visit: Payer: Self-pay | Admitting: *Deleted

## 2017-08-10 ENCOUNTER — Other Ambulatory Visit: Payer: Self-pay | Admitting: Pharmacist

## 2017-08-10 ENCOUNTER — Ambulatory Visit: Payer: PPO | Admitting: Pharmacist

## 2017-08-10 ENCOUNTER — Other Ambulatory Visit: Payer: Self-pay | Admitting: Pharmacy Technician

## 2017-08-10 MED ORDER — TIOTROPIUM BROMIDE MONOHYDRATE 2.5 MCG/ACT IN AERS: 2.0000 | INHALATION_SPRAY | Freq: Every day | RESPIRATORY_TRACT | 4 refills | Status: DC

## 2017-08-10 NOTE — Telephone Encounter (Signed)
Called [name] but there was no answer. Left message to call her back.

## 2017-08-10 NOTE — Patient Outreach (Signed)
[facility]) Care Management  08/10/2017  Kristin Hood 01-May-1940 [id number]   Contacted Dr. Sudie Bailey office in regards to a request I placed thru in-basket on 05/10 to nurse Kristin Hood about having the prescription for Spiriva Respimat faxed to me. I have not received a response to message sent.  I called today and left message with [facility] requesting a prescription be faxed to me so that I can submit it with patients application to try to get it approved. Upon reviewing patients chart I see that a script for the Spiriva respimat was sent into [facility] today.   Will call office back 05/15, if I have not received script or call  Kristin Hood. [place], [place] Management [phone]

## 2017-08-11 NOTE — Telephone Encounter (Signed)
[name] returning call. Per [name], she will await fax. Fax number [phone] Cb is [phone]

## 2017-08-11 NOTE — Patient Outreach (Signed)
[place] [facility]) Care Management  08/11/2017  Kristin Hood 10/06/1940 [id number]  Patient called to inquire about the price of Eliquis. HIPAA identifiers were obtained. Patient reported being charged >$300 for a 30 day supply of Eliquis.  She wondered if she could begin the process for medication assistance for Eliquis. [place] is already assisting the patient with medication assistance with Spiriva.  [employer] was emalied to inquire about the patient's TROOP.   Plan: Awaiting the TROOP from [employer]. Once received will assess the patient's financials and reach back out to her within 3-5 business days.  [name], PharmD, [place] Clinical Pharmacist [phone]

## 2017-08-11 NOTE — Telephone Encounter (Signed)
The prescription has been given to Dr. [name] to sign. Once it's signed I will fax it back to [place].

## 2017-08-12 ENCOUNTER — Other Ambulatory Visit: Payer: Self-pay | Admitting: Pharmacist

## 2017-08-12 NOTE — Telephone Encounter (Signed)
Rx has been faxed to [place] as of 08/11/17. Spoke with [name] via staff message was informed that she received this prescription. Nothing further was needed.

## 2017-08-12 NOTE — Patient Outreach (Signed)
[facility]) Care Management  08/12/2017  Kristin Hood January 24, 1941 [id number]   Patient was called regarding medication assistance. Unfortunately, she did not answer the phone. HIPAA compliant message was left on the patient's voicemail.    Received the patient's TROOP from HTA:  YTD spending:  $405.94 Patient is in the coverage gap. Total Drug spend year to date:  $3821.51  Plan:  Will call patient back in 3-5 business days.  [name], PharmD, [place] Clinical Pharmacist [phone]

## 2017-08-17 ENCOUNTER — Telehealth: Payer: Self-pay

## 2017-08-17 ENCOUNTER — Other Ambulatory Visit: Payer: Self-pay

## 2017-08-17 ENCOUNTER — Other Ambulatory Visit: Payer: Self-pay | Admitting: Pharmacist

## 2017-08-17 ENCOUNTER — Other Ambulatory Visit: Payer: Self-pay | Admitting: Pharmacy Technician

## 2017-08-17 MED ORDER — APIXABAN 5 MG PO TABS: 5.0000 mg | ORAL_TABLET | Freq: Two times a day (BID) | ORAL | 3 refills | Status: DC

## 2017-08-17 NOTE — Patient Outreach (Signed)
[place] [facility]) Care Management  08/17/2017  Kristin Hood [birth year] [id number]   Patient called and stated she received a letter from [employer] stating her entire application would need to be resent.  There was a delay getting the prescription from Dr. Agustina Caroli office so the application and prescription were sent separately. Spoke with [name]. [name] said she resent the entire application on 05/09/11 and called [employer] on 08/14/17 but they had not received/processed it yet.  [name] has plans to call [employer] back to follow up next week.  Plan: Send application for Eliquis to the patient. Follow up in 2 weeks on both.   [name], PharmD, [place] Clinical Pharmacist [phone]

## 2017-08-17 NOTE — Telephone Encounter (Signed)
I received the provider part of a [employer] pt assistance application to complete and have signed by Dr [name] from [name], CPhT via fax.  I completed the provider part of the application, printed an Eliquis RX, Dr [name] has signed both and I have faxed all back to [facility] "attention [name]".  I did receive a confirmation that the fax was successful.

## 2017-08-17 NOTE — Patient Outreach (Signed)
[place] [facility]) Care Management  08/17/2017  Kristin Hood 13-Apr-1940 [id number]   Received [place] patient assistance referral from [facility]. Prepared patient portion to be mailed out. Faxed provider portion to [name] on 05/20.  Will follow up with patient in 7-10 business days to verify receipt of application.  [name] [place], [place] Management [phone]

## 2017-08-18 ENCOUNTER — Ambulatory Visit: Payer: Self-pay | Admitting: Pharmacist

## 2017-08-21 ENCOUNTER — Other Ambulatory Visit: Payer: Self-pay | Admitting: Pharmacist

## 2017-08-21 ENCOUNTER — Other Ambulatory Visit: Payer: Self-pay | Admitting: Pharmacy Technician

## 2017-08-21 NOTE — Patient Outreach (Signed)
[facility]) Care Management  08/21/2017  Kristin Hood 08/15/1940 [id number]   Consulted with [name], CPhT. Patient has been approved to receive Spiriva from [place] has shipped a three month supply to the patient's home.  Unfortunately, patient did not answer the phone when called.  HIPAA compliant message was left on the patient's voicemail.   Plan: Call patient back at previously scheduled phone call. Await a call back from the patient.  [name], PharmD, [place] Clinical Pharmacist [phone]

## 2017-08-21 NOTE — Patient Outreach (Signed)
[place] [facility]) Care Management  08/21/2017  SUAN PYEATT 1940-11-25 [id number]   Contacted [employer] to check status of patient application for Spiriva Respimat. Spoke to [place] who stated patient was approved on 05/20 until 03/30/18. She also stated that medication was mailed out on 05/22 and it takes about 7-10 business days.  Will follow up with patient in the next 7-10 business days to confirm patient has received medication.  [name] [place], [place] Management [phone]

## 2017-08-28 ENCOUNTER — Other Ambulatory Visit: Payer: Self-pay | Admitting: Pharmacy Technician

## 2017-08-28 DIAGNOSIS — L03114 Cellulitis of left upper limb: Secondary | ICD-10-CM | POA: Diagnosis not present

## 2017-08-28 DIAGNOSIS — S51812A Laceration without foreign body of left forearm, initial encounter: Secondary | ICD-10-CM | POA: Diagnosis not present

## 2017-08-28 NOTE — Patient Outreach (Signed)
[facility]) Care Management  08/28/2017  Kristin Hood [birth year] [id number]   Successful outreach call, HIPAA identifier verified. Contacted patient to see if she had received the [employer] application that was mailed out around the 21st. Patient stated that she hasn't received it as of yet but will be on the lookout. Patient confirmed that she has received her Spiriva from [place].  Will follow up with patient in the next 5 days to verify that she has received application.  [name] [place], [place] Management [phone]

## 2017-08-31 ENCOUNTER — Other Ambulatory Visit: Payer: Self-pay | Admitting: Pharmacist

## 2017-08-31 ENCOUNTER — Other Ambulatory Visit: Payer: Self-pay | Admitting: Pharmacy Technician

## 2017-08-31 ENCOUNTER — Ambulatory Visit: Payer: Self-pay | Admitting: Pharmacist

## 2017-08-31 NOTE — Patient Outreach (Addendum)
[place] [facility]) Care Management  08/31/2017  Kristin Hood 1941/03/28 [id number]   Patient was called to follow up on medication assistance. Unfortunately, she did not answer the phone.  HIPAA compliant message was left on the patient's voicemail.  Patient confirmed she received Spiriva via mail last week during a conversation with [name], CPhT.  Patient was sent an application for Eliquis ([place]) 08/21/17.  Plan: Call patient back in 7-10 business days.  [name], PharmD, [place] Clinical Pharmacist [phone]  Patient called back. HIPAA identifiers were obtained. Patient confirmed she did receive Spiriva from [employer].  Patient said she did not receive the application for Eliquis from [employer].    Note will be sent to [name], CPhT to resend application.   [name], PharmD, [place] Clinical Pharmacist [phone]

## 2017-08-31 NOTE — Patient Outreach (Signed)
[facility] [facility]) Care Management  08/31/2017  BOWEN GOYAL Feb 12, 1941 [id number]  Incoming call from patient stating she had spoken to [place] this morning and stated she had not received patient assistance application however it did come in the mail today. Patient stated that she would sign application and mail it back in ASAP.  Will submit completed application to [facility] and will follow up with patient in the next 2 weeks.  [name] [place], [place] Management [phone]

## 2017-09-03 ENCOUNTER — Other Ambulatory Visit: Payer: Self-pay | Admitting: Pharmacy Technician

## 2017-09-03 NOTE — Patient Outreach (Signed)
[place] [facility]) Care Management  09/03/2017  Kristin Hood 03-Nov-1940 [id number]   Received patient portion of [place] patient assistance application for Eliquis. Faxed completed application and required documentation into company on 06/06.  Will follow up with company in 7-10 business days  [employer]. [place], [place] Management [phone]

## 2017-09-04 NOTE — Telephone Encounter (Signed)
Letter received via fax from [place] stating that the pt has been approved for Pt Asst with Eliquis. Approval good from 09/04/17 until 03/30/18. Application case# [id number].  I will fax a copy to Kristin Hood, CPhT and send a copy to be scanned into the pts chart.

## 2017-09-07 ENCOUNTER — Other Ambulatory Visit: Payer: Self-pay | Admitting: Pharmacy Technician

## 2017-09-07 NOTE — Patient Outreach (Addendum)
[place] [facility]) Care Management  09/07/2017  Kristin Hood 1940/08/31 [id number]   Incoming call from Kristin Hood asking if I had heard from [facility] about her application for Eliquis. Informed Kristin Hood that her providers office had just faxed over a letter from [facility] stating that she had been approved. Kristin Hood states that she has enough medication to last her about 8 more days. I informed her that I would contact [employer] tomorrow and check shipment status and then inform her.  Will follow up with [employer] on 06/11, then will update Kristin Hood.  [name] [place], [place] Management [phone]   Addendum 12:50pm Spoke to [place] at [facility] and she stated Kristin Hood should receive shipment in the next 7-10 business days.

## 2017-09-08 ENCOUNTER — Ambulatory Visit: Payer: Self-pay | Admitting: Pharmacist

## 2017-09-22 ENCOUNTER — Other Ambulatory Visit: Payer: Self-pay | Admitting: Pharmacy Technician

## 2017-09-22 NOTE — Patient Outreach (Signed)
[facility]) Care Management  09/22/2017  Kristin Hood [birth year] [id number]   Patient left voicemail stating that she is almost out of her Eliquis.  Contacted [employer] [employer] to check status of Eliquis shipment, [name] informed me that she cold have it expedited and sent to Kristin Hood on 06/26 by [employer].   Successful outreach call to Kristin Hood, HIPAA identifiers verified. Informed her that I contacted [employer] and that her Eliquis should arrive tomorrow.  Will follow up with patient in the next 2-3 days to confirm medication was received.  [name] [place], [place] Management [phone]

## 2017-09-25 ENCOUNTER — Other Ambulatory Visit: Payer: Self-pay | Admitting: Pharmacy Technician

## 2017-09-25 NOTE — Patient Outreach (Signed)
[place] [facility]) Care Management  09/25/2017  KEALY LEWTER 26-Sep-1940 [id number]   Successful outreach call to patient, HIPAA identifiers verified. Patient confirmed that she received her Eliquis and that she has no questions about how to obtain refills for the Eliquis and Spiriva.  Will route note to [place] for case closure.  [name] [place], [place] Management [phone]

## 2017-09-28 ENCOUNTER — Encounter: Payer: Self-pay | Admitting: Pharmacist

## 2017-10-03 ENCOUNTER — Other Ambulatory Visit: Payer: Self-pay | Admitting: Cardiology

## 2017-10-13 ENCOUNTER — Encounter: Payer: Self-pay | Admitting: Cardiology

## 2017-10-27 ENCOUNTER — Ambulatory Visit (INDEPENDENT_AMBULATORY_CARE_PROVIDER_SITE_OTHER): Payer: PPO | Admitting: Internal Medicine

## 2017-10-27 ENCOUNTER — Encounter: Payer: Self-pay | Admitting: Internal Medicine

## 2017-10-27 VITALS — BP 122/80 | HR 88 | Ht 66.0 in | Wt 237.2 lb

## 2017-10-27 DIAGNOSIS — Z9889 Other specified postprocedural states: Secondary | ICD-10-CM

## 2017-10-27 DIAGNOSIS — E890 Postprocedural hypothyroidism: Secondary | ICD-10-CM

## 2017-10-27 LAB — TSH: TSH: 12.46 u[IU]/mL — AB (ref 0.35–4.50)

## 2017-10-27 LAB — T4, FREE: FREE T4: 1.43 ng/dL (ref 0.60–1.60)

## 2017-10-27 MED ORDER — LEVOTHYROXINE SODIUM 175 MCG PO TABS: ORAL_TABLET | ORAL | 3 refills | Status: DC

## 2017-10-27 NOTE — Patient Instructions (Signed)
Please stop at the lab.  Please continue Synthroid 175 mcg daily.  Take the thyroid hormone every day, with water, at least 30 minutes before breakfast, separated by at least 4 hours from: - acid reflux medications - calcium - iron - multivitamins  Please come back for a follow-up appointment in 1 year.

## 2017-10-27 NOTE — Progress Notes (Signed)
Patient ID: Kristin Hood, female   DOB: 15-Aug-1940, 77 y.o.   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 Last visit 1 year ago.  She is having LE swelling especially after staying on her feet more. Lasix helps.  Her balance improved especially after moving in with her daughter and her 4 pets >> she is much more active. She also lost 10 lbs since last visit.  Reviewed history: She described having L-sided chest pressure (11/11/2013) >> went to the ED: found to be in A fib. Also had a Chest CT: Mediastinal and retroperitoneal lymph nodes, so a PET scan was recommended: a thyroid nodule appeared hypermetabolic.   PET scan (33/82/5053): hypermetabolic R thyroid nodule  Thyroid U/S (11/21/2013): R 2.7 x 1.8 cm nodule, heterogeneous, with coarse calcifications  R nodule Bx (12/01/2013): FLUS  11/30/2013 TSH: 6.84 (0.34-4.5).  R nodule Bx (07/11/2014): FLUS  07/31/2014: Called and d/w pt about the results of the Afirma test, which is "suspicious for neoplasm" (test results scanned under Media tab). This carries a risk of 75% for cancer. In this case, I suggested total thyroidectomy.   03/02/2015: Total thyroidectomy - Dr Harlow Asa >> benign pathology! (surprisingly)  Pt is on levothyroxine 175 mcg daily, taken: - in am - fasting - at least 30 min from b'fast -+ Iron, multivitamins, omeprazole  - at noon - not on Biotin  Reviewed patient's TFTs: Lab Results  Component Value Date   TSH 3.00 10/27/2016   TSH 4.54 (H) 03/04/2016   TSH 6.46 (H) 11/19/2015   TSH 17.91 (H) 10/03/2015   TSH 25.94 (H) 08/22/2015   TSH 31.84 (H) 07/20/2015   TSH 36.21 (H) 06/06/2015   TSH 4.02 12/11/2014   FREET4 1.59 10/27/2016   FREET4 1.66 (H) 03/04/2016   FREET4 1.55 11/19/2015   FREET4 1.26 10/03/2015   FREET4 1.00 08/22/2015   FREET4 1.04 07/20/2015    FREET4 1.00 06/06/2015   FREET4 1.13 12/11/2014  04/11/2015: TSH 40.5  Pt denies: - feeling nodules in neck - hoarseness - dysphagia - choking - SOB with lying down  Pt does have a FH of thyroid ds.: mother (hypothyroidism). No FH of thyroid cancer. 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  No herbal supplements. No Biotin use. No recent steroids use.   She was admitted with Acute respiratory failure and CHF 05/2016 >> started Lasix >> lost 20 lbs. She sees [name]. She lost her husband after he fell and had a concussion in 06/2016.  ROS: Constitutional: + intentional weight loss, no fatigue, no subjective hyperthermia, no subjective hypothermia Eyes: no blurry vision, no xerophthalmia ENT: no sore throat, + see HPI Cardiovascular: no CP/+ SOB/no palpitations/+ leg swelling Respiratory: no cough/+ SOB/no wheezing Gastrointestinal: no N/no V/no D/no C/no acid reflux Musculoskeletal: no muscle aches/no joint aches Skin: no rashes, no hair loss Neurological: no tremors/no numbness/no tingling/no dizziness  I reviewed pt's medications, allergies, PMH, social hx, family hx, and changes were documented in the history of present illness. Otherwise, unchanged from my initial visit note.  Past Medical History:  Diagnosis Date  . Anginal pain ([place])   . Arthritis   . Atrial fibrillation ([place]) 07/2012   Eliquis, rate controlled  . Breast cancer ([place]) 2001   left mastectomy  . Coarse tremors    ,  Essential  . Complication of anesthesia    SMALLER TUBE FOR INTUBATION AS GAGS ON TUBE  . COPD (chronic obstructive pulmonary disease) ([place])   . CVA (cerebral vascular accident) ([place]) 2013  . Dysrhythmia 08/20/2012   Atrial Fibrillation  . Family history of adverse reaction to anesthesia    mother experienced pain with intubation   . GERD (gastroesophageal reflux disease)    history of   . Hard of hearing   . Heart murmur   . History of  chemotherapy 11/13/1998 to 2010   Adriamycin/Docotaxol, Dexorubicin/Taxotere, Femara, Tamoxifen  . History of hiatal hernia    history of   . Hx of adenomatous colonic polyps 2006   , Benign  . Hypercholesteremia   . Hypertension    , With mild concentric left ventricular hypertrophy  . Mixed dyslipidemia   . Shingles   . Shortness of breath dyspnea    on exertion   . Spider veins   . Stroke ([place]) 11/17/2011   left side brain-speech-TPA  . Thyroid nodule   . Tremor, essential   . Valvular sclerosis 09/23/2003   without stenosis  . Varicose veins    Past Surgical History:  Procedure Laterality Date  . APPENDECTOMY  07/10/1957  . BIOPSY THYROID  12/01/2013   ultrasound  . BREAST SURGERY    . BUNIONECTOMY  11/29/1988   bilateral with hammer toes  . CARDIAC CATHETERIZATION  07/2010   , Without significant CAD  . CARDIAC CATHETERIZATION  07/2011   [name]  . CESAREAN SECTION    . CESAREAN SECTION  05/1968, 07/1965  . EYE SURGERY  01/31/2013   eye lid; cataract surgery bilat   . JOINT REPLACEMENT  05/2001   left knee  . JOINT REPLACEMENT  11/2001   right knee  . JOINT REPLACEMENT  01/2008   redo right knee  . KNEE SURGERY  2009   ,Total knee replacement  . LEFT HEART CATHETERIZATION WITH CORONARY ANGIOGRAM N/A 08/14/2011   Procedure: LEFT HEART CATHETERIZATION WITH CORONARY ANGIOGRAM;  Surgeon: [name], MD;  Location[facility] C[facility] LAB;  Service: Cardiovascular;  Laterality: N/A;  . Left Rotator Cuff    . MASTECTOMY  10/19/1998   Radical, left -with lymph nodes (8 total)  . Reverse bunionectomy  08/29/1993   removal of Tibial Sesamoid-left  . TEE WITHOUT CARDIOVERSION  11/19/2011   Procedure: TRANSESOPHAGEAL ECHOCARDIOGRAM (TEE);  Surgeon: [name], MD;  Location[facility] ENDOSCOPY;  Service: Cardiovascular;  Laterality: N/A;  . THYROIDECTOMY N/A 03/02/2015   Procedure: TOTAL THYROIDECTOMY;  Surgeon: Armandina Gemma, MD;  Location: [facility] ORS;  Service: General;  Laterality:  N/A;  . TONSILLECTOMY    . TOTAL HIP ARTHROPLASTY Left 10/11/2014   Procedure: LEFT TOTAL HIP ARTHROPLASTY ANTERIOR APPROACH;  Surgeon: [name], MD;  Location: [facility] ORS;  Service: Orthopedics;  Laterality: Left;   History   Social History  . Marital Status: Married    Spouse Name: N/A    Number of Children: 2   Occupational History  . homemaker   Social History Main Topics  . Smoking status: Former Smoker -- 2.50 packs/day for 13 years    Types: Cigarettes    Quit date: 01/30/1992  . Smokeless tobacco: Never Used  . Alcohol Use:     3 drink(s) per week  . Drug Use: No   Current Outpatient Medications on File Prior to Visit  Medication Sig Dispense Refill  . albuterol (PROVENTIL HFA;VENTOLIN HFA) 108 (90 Base) MCG/ACT inhaler Inhale 2  puffs into the lungs every 4 (four) hours as needed for wheezing or shortness of breath. 1 Inhaler 5  . amLODipine (NORVASC) 5 MG tablet Take 5 mg by mouth daily.    Marland Kitchen apixaban (ELIQUIS) 5 MG TABS tablet Take 1 tablet (5 mg total) by mouth 2 (two) times daily. 180 tablet 3  . atorvastatin (LIPITOR) 20 MG tablet Take 10 mg by mouth daily after supper.     . fenofibrate 160 MG tablet Take 160 mg by mouth daily after supper.     . ferrous sulfate 325 (65 FE) MG EC tablet Take 325 mg by mouth daily after supper.     . fluticasone (FLONASE) 50 MCG/ACT nasal spray Place 2 sprays into both nostrils as needed for allergies or rhinitis.    Marland Kitchen ketoconazole (NIZORAL) 2 % cream pt uses 1 application topically daily as needed for rash  0  . levothyroxine (SYNTHROID, LEVOTHROID) 175 MCG tablet TAKE 1 TABLET BY MOUTH DAILY BEFORE BREAKFAST. 90 tablet 0  . losartan (COZAAR) 100 MG tablet Take 100 mg by mouth daily.    . Multiple Vitamin (MULTIVITAMIN WITH MINERALS) TABS tablet Take 1 tablet by mouth daily.    . propranolol ER (INDERAL LA) 60 MG 24 hr capsule TAKE 2 CAPS IN THE MORNING TAKE 1 CAP IN THE AFTERNOON, 1 CAP AT NIGHT 360 capsule 2  .  Pseudoephedrine-Guaifenesin (MUCINEX D PO) Take 1 capsule by mouth daily.     . Tiotropium Bromide Monohydrate (SPIRIVA RESPIMAT) 2.5 MCG/ACT AERS Inhale 2 puffs into the lungs daily. 3 Inhaler 4  . traMADol (ULTRAM) 50 MG tablet Take 50 mg by mouth every 8 (eight) hours as needed. for pain  0  . furosemide (LASIX) 20 MG tablet Take 1 tablet (20 mg total) by mouth daily. May take extra tablet daily as needed for weight gain 120 tablet 3  . potassium chloride (K-DUR) 10 MEQ tablet Take 1 tablet (10 mEq total) by mouth daily. Take extra tablet by mouth when taking extra furosemide 120 tablet 3   No current facility-administered medications on file prior to visit.    Allergies  Allergen Reactions  . Floxin [Ofloxacin] Nausea Only and Other (See Comments)    Dizziness, Vertigo    Family History  Problem Relation Age of Onset  . Heart disease Mother   . Aortic stenosis Mother   . Heart disease Father   . Heart failure Father   . Heart attack Father    PE: BP 122/80   Pulse 88   Ht '5\' 6"'  (1.676 m)   Wt 237 lb 3.2 oz (107.6 kg)   SpO2 93%   BMI 38.29 kg/m  Body mass index is 38.29 kg/m. Wt Readings from Last 3 Encounters:  10/27/17 237 lb 3.2 oz (107.6 kg)  07/21/17 234 lb 8 oz (106.4 kg)  05/21/17 238 lb 12.8 oz (108.3 kg)   Constitutional: overweight, in NAD Eyes: PERRLA, EOMI, no exophthalmos ENT: moist mucous membranes, cervical scar healed, no cervical lymphadenopathy Cardiovascular: RRR, No MRG Respiratory: CTA B Gastrointestinal: abdomen soft, NT, ND, BS+ Musculoskeletal: no deformities, strength intact in all 4 Skin: moist, warm, no rashes Neurological: + tremor with outstretched hands, DTR 0/4 in B knees (previous sx)  ASSESSMENT: 1. Postoperative hypothyroidism - h/o R thyroid nodule, hypermetabolic on PET, FLUS x2 on Bx, Afirma + >> benign final pathology  2. S/p total thyroidectomy for R thyroid nodule  PLAN: 1. Postoperative hypothyroidism Patient with a  history of a  right large thyroid nodule, which appeared to contain microcalcifications, appeared hypermetabolic on PET scan, had 2x FLUS biopsies and a positive AFIRMA molecular marker test.  With all the above factors pointing to her thyroid cancer, I recommended thyroidectomy which she had in 03/2015.  Very surprisingly, the final pathology was benign. -- latest thyroid labs reviewed with pt >> normal 1 year ago: Lab Results  Component Value Date   TSH 3.00 10/27/2016  - she continues on LT4  175 mcg daily - pt feels good on this dose. - we discussed about taking the thyroid hormone every day, with water, >30 minutes before breakfast, separated by >4 hours from acid reflux medications, calcium, iron, multivitamins. Pt. is taking it correctly. - will check thyroid tests today: TSH and fT4 - If labs are abnormal, she will need to return for repeat TFTs in 1.5 months - Otherwise, we will see her in a year   2. S/p total thyroidectomy for R thyroid nodule - no neck compression sxs - No dysesthesia or pain at the surgical site. Also, no masses palpated in neck  Needs refills.  Component     Latest Ref Rng & Units 10/27/2017  TSH     0.35 - 4.50 uIU/mL 12.46 (H)  T4,Free(Direct)     0.60 - 1.60 ng/dL 1.43   TSH is much higher than before.  I suspect that she skipped doses,   Since she had normal TFTs on the same dose in the past.  We will continue the current dose but have her back for labs in 2 months.  Philemon Kingdom, MD PhD [facility] Endocrinology

## 2017-12-02 NOTE — Progress Notes (Signed)
Cardiology Office Note   Date:  12/04/2017   ID:  LOY LITTLE, DOB 1940/07/23, MRN [id number]  PCP:  [name], MD  Cardiologist:  [name]    Chief Complaint  Patient presents with  . Congestive Heart Failure      History of Present Illness: Kristin Hood is a 77 y.o. female who presents for atrial fib.   She has a hx of atrial fibrillation discovered on 08/20/12, chronic anticoagulation, COPD, prior stroke, received TPA for cerebral artery occlusion (could not raise arm-8/13) . DID Live next door to [place] and [employer] [place] but now lives with her daughter.      Echocardiogram demonstrated mild to moderate left atrial enlargement, normal ejection fraction, mild aortic stenosis, MAC, mild mitral regurgitation.  Had 3 knee replacements.   She underwent catheterization on 08/14/11 which showed no evidence of coronary artery disease. LVEDP was 14 mm mercury.  Went to ER on 12/10/12 had a terrific pain in her chest. Lasted about hour at home BP 159/100, 3 times. Went on to ER. Was worried. Once she got to [facility] she quit hurting. Sent to ER. Wanted to keep her overnight. Did troponin did well. Did OK. Prob GERD.   Went again to [facility]. Afib increased rate. PET scan. 3 nodules one thyroid. Endocrine, no good sample.   Unfortunately, her son-in-law died 2 weeks after neck surgery from pulmonary embolism.  2014/08/07 - Hip surgery. Dr. Wynelle Link. She has had extensive workup including oncology workup for nodules in her lungs, lymph nodes, thyroid nodule. She will be followed up on a yearly basis and oncology. Reassurance. Overall, no chest pain other than mild GERD at night after dinner. No syncope, no change in shortness of breath.  05/16/15-baseline shortness of breath. She did go to the emergency room because of centralized chest discomfort. Troponin was normal, EKG unremarkable. Thankfully she started taking Zantac and this was improved.  11/12/15-reassuring thyroid  tissue upon excision. No cancer. Shortness of breath continues. Baseline. On Symbicort. COPD. Eliquis has been expensive. Trying to get assistance.  06-Aug-2016 - AFIB, Jonne Ply) died. Needs toe repositioned. Right varicose vein. No significant shortness of breath. Occasionally she will still feel palpitations in the evening hours, around 4 PM. She has taken an extra Inderal at that time which seems to help area she recounted a story where she felt it racing, her grandson asked her if she wanted to go to dinner and she forgot about it and did not have any problems. Once again [place] and [place]ell Faith are her neighbor.  05/21/17 - 12/30/16 hospitalization. Combination from heart and lungs. Weight set at 240 when left the hospital.  Now her base weight is 235.  She understands to take an extra Lasix if necessary.  She has been doing a good job on maintaining this weight.   Today she is excited to report living with her daughter, they have a pool and dogs and she is enjoying living there.  She is energetic this visit.  Mild lower ext edema due to taking care of a puppy, but goes away during the night.  She has palpitations at times about 4 pm but since she discussed with Dr.Skains and understands she can take an extra propranolol she has less frequently and is not afraid when it does occur.  No chest pain and no SOB except her usual.  No bleeding with the eliquis.    Past Medical History:  Diagnosis Date  .  Acute congestive heart failure ([place])   . Acute diastolic CHF (congestive heart failure) ([place]) 12/30/2016  . Acute respiratory failure with hypoxemia ([place]) 06/09/2016  . Acute respiratory failure with hypoxia ([place]) 12/30/2016  . Anginal pain ([place])   . ARF (acute renal failure) ([place]) 06/11/2016  . Arthritis   . Atrial fibrillation ([place]) 07/2012   Eliquis, rate controlled  . Benign hypertensive heart disease without heart failure 12/25/2012  . Breast cancer ([place]) 2001   left mastectomy  . Chest pain 12/11/2012    . CHF (congestive heart failure) ([place]) 06/10/2016  . Chronic atrial fibrillation ([place]) 11/16/2014  . Chronic diastolic heart failure ([place]) 02/13/2014  . Coarse tremors    , Essential  . Complication of anesthesia    SMALLER TUBE FOR INTUBATION AS GAGS ON TUBE  . COPD (chronic obstructive pulmonary disease) ([place])   . COPD exacerbation ([place]) 12/30/2016  . CVA (cerebral vascular accident) ([place]) 2013  . Dysrhythmia 08/20/2012   Atrial Fibrillation  . Essential and other specified forms of tremor 06/18/2012  . Essential hypertension 06/18/2012  . Family history of adverse reaction to anesthesia    mother experienced pain with intubation   . GERD (gastroesophageal reflux disease)    history of   . Hard of hearing   . Heart murmur   . History of chemotherapy 11/13/1998 to 2010   Adriamycin/Docotaxol, Dexorubicin/Taxotere, Femara, Tamoxifen  . History of CVA (cerebrovascular accident) 12/11/2012  . History of hiatal hernia    history of   . Hx of adenomatous colonic polyps 2006   , Benign  . Hypercholesteremia   . Hyperlipidemia 02/13/2014  . Hypertension    , With mild concentric left ventricular hypertrophy  . Long term current use of anticoagulant therapy 12/25/2012  . Mixed dyslipidemia   . Mixed hyperlipidemia 12/25/2012  . Morbid obesity ([place]) 06/18/2012  . Phlebitis and thrombophlebitis of superficial vessels of lower extremities 12/25/2012  . 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 Pure hypercholesterolemia 12/25/2012  . Shingles   . Shortness of breath dyspnea    on exertion   . SOB (shortness of breath) 11/16/2014  . Spider veins   . Stroke ([place]) 11/17/2011   left side brain-speech-TPA  . Thyroid nodule   . Tremor, essential   . Unspecified cerebral artery occlusion with cerebral infarction 11/18/2011  . Valvular sclerosis 09/23/2003   without stenosis  . Varicose veins     Past Surgical History:  Procedure Laterality Date  . APPENDECTOMY  07/10/1957  . BIOPSY THYROID   12/01/2013   ultrasound  . BREAST SURGERY    . BUNIONECTOMY  11/29/1988   bilateral with hammer toes  . CARDIAC CATHETERIZATION  07/2010   , Without significant CAD  . CARDIAC CATHETERIZATION  07/2011   [name]  . CESAREAN SECTION    . CESAREAN SECTION  05/1968, 07/1965  . EYE SURGERY  01/31/2013   eye lid; cataract surgery bilat   . JOINT REPLACEMENT  05/2001   left knee  . JOINT REPLACEMENT  11/2001   right knee  . JOINT REPLACEMENT  01/2008   redo right knee  . KNEE SURGERY  2009   ,Total knee replacement  . LEFT HEART CATHETERIZATION WITH CORONARY ANGIOGRAM N/A 08/14/2011   Procedure: LEFT HEART CATHETERIZATION WITH CORONARY ANGIOGRAM;  Surgeon: [name], MD;  Location[facility] C[facility] LAB;  Service: Cardiovascular;  Laterality: N/A;  . Left Rotator Cuff    . MASTECTOMY  10/19/1998   Radical, left -with lymph nodes (8  total)  . Reverse bunionectomy  08/29/1993   removal of Tibial Sesamoid-left  . TEE WITHOUT CARDIOVERSION  11/19/2011   Procedure: TRANSESOPHAGEAL ECHOCARDIOGRAM (TEE);  Surgeon: [name], MD;  Location: [facility] ENDOSCOPY;  Service: Cardiovascular;  Laterality: N/A;  . THYROIDECTOMY N/A 03/02/2015   Procedure: TOTAL THYROIDECTOMY;  Surgeon: Armandina Gemma, MD;  Location: [facility] ORS;  Service: General;  Laterality: N/A;  . TONSILLECTOMY    . TOTAL HIP ARTHROPLASTY Left 10/11/2014   Procedure: LEFT TOTAL HIP ARTHROPLASTY ANTERIOR APPROACH;  Surgeon: [name], MD;  Location: [facility] ORS;  Service: Orthopedics;  Laterality: Left;     Current Outpatient Medications  Medication Sig Dispense Refill  . albuterol (PROVENTIL HFA;VENTOLIN HFA) 108 (90 Base) MCG/ACT inhaler Inhale 2 puffs into the lungs every 4 (four) hours as needed for wheezing or shortness of breath. 1 Inhaler 5  . amLODipine (NORVASC) 5 MG tablet Take 5 mg by mouth daily.    Marland Kitchen apixaban (ELIQUIS) 5 MG TABS tablet Take 1 tablet (5 mg total) by mouth 2 (two) times daily. 180 tablet 3  . atorvastatin (LIPITOR) 20 MG  tablet Take 10 mg by mouth daily after supper.     . fenofibrate 160 MG tablet Take 160 mg by mouth daily after supper.     . ferrous sulfate 325 (65 FE) MG EC tablet Take 325 mg by mouth daily after supper.     . fluticasone (FLONASE) 50 MCG/ACT nasal spray Place 2 sprays into both nostrils as needed for allergies or rhinitis.    Marland Kitchen ketoconazole (NIZORAL) 2 % cream pt uses 1 application topically daily as needed for rash  0  . levothyroxine (SYNTHROID, LEVOTHROID) 175 MCG tablet TAKE 1 TABLET BY MOUTH DAILY BEFORE BREAKFAST. 90 tablet 3  . losartan (COZAAR) 100 MG tablet Take 100 mg by mouth daily.    . Multiple Vitamin (MULTIVITAMIN WITH MINERALS) TABS tablet Take 1 tablet by mouth daily.    . propranolol ER (INDERAL LA) 60 MG 24 hr capsule TAKE 2 CAPS IN THE MORNING TAKE 1 CAP IN THE AFTERNOON, 1 CAP AT NIGHT 360 capsule 2  . Pseudoephedrine-Guaifenesin (MUCINEX D PO) Take 1 capsule by mouth daily.     . Tiotropium Bromide Monohydrate (SPIRIVA RESPIMAT) 2.5 MCG/ACT AERS Inhale 2 puffs into the lungs daily. 3 Inhaler 4  . furosemide (LASIX) 20 MG tablet Take 1 tablet (20 mg total) by mouth daily. May take extra tablet daily as needed for weight gain 120 tablet 3  . potassium chloride (K-DUR) 10 MEQ tablet Take 1 tablet (10 mEq total) by mouth daily. Take extra tablet by mouth when taking extra furosemide 120 tablet 3   No current facility-administered medications for this visit.     Allergies:   Floxin [ofloxacin]    Social History:  The patient  reports that she quit smoking about 25 years ago. Her smoking use included cigarettes. She has a 60.00 pack-year smoking history. She has never used smokeless tobacco. She reports that she drinks about 1.0 standard drinks of alcohol per week. She reports that she does not use drugs.   Family History:  The patient's family history includes Aortic stenosis in her mother; Heart attack in her father; Heart disease in her father and mother; Heart failure  in her father.    ROS:  General:no colds or fevers, no weight changes Skin:no rashes or ulcers HEENT:no blurred vision, no congestion CV:see HPI PUL:see HPI GI:no diarrhea constipation or melena, no indigestion GU:no hematuria, no dysuria  MS:no joint pain, no claudication Neuro:no syncope, no lightheadedness, hx of CVA Endo:no diabetes, no thyroid disease   Wt Readings from Last 3 Encounters:  12/03/17 238 lb (108 kg)  10/27/17 237 lb 3.2 oz (107.6 kg)  07/21/17 234 lb 8 oz (106.4 kg)     PHYSICAL EXAM: VS:  BP 120/60   Pulse 79   Ht _0  (1.676 m)   Wt 238 lb (108 kg)   BMI 38.41 kg/m  , BMI Body mass index is 38.41 kg/m. General:Pleasant affect, NAD Skin:Warm and dry, brisk capillary refill HEENT:normocephalic, sclera clear, mucus membranes moist Neck:supple, no JVD, no bruits  Heart:ireg irreg without murmur, gallup, rub or click Lungs:clear without rales, rhonchi, or wheezes LYY:TKPT, non tender, + BS, do not palpate liver spleen or masses Ext:no lower ext edema, 2+ pedal pulses, 2+ radial pulses Neuro:alert and oriented X 3, MAE, follows commands, + facial symmetry    EKG:  EKG is ordered today. The ekg ordered today demonstrates A fib rate controlled. No acute changes.   Recent Labs: 12/30/2016: B Natriuretic Peptide 481.7 05/21/2017: Hemoglobin 11.3; Platelets 325 07/17/2017: ALT 15; BUN 32; Creatinine, Ser 1.08; Potassium 4.6; Sodium 138 10/27/2017: TSH 12.46    Lipid Panel    Component Value Date/Time   CHOL 148 11/18/2011 0420   TRIG 101 11/18/2011 0420   HDL 34 (L) 11/18/2011 0420   CHOLHDL 4.4 11/18/2011 0420   VLDL 20 11/18/2011 0420   LDLCALC 94 11/18/2011 0420       Other studies Reviewed: Additional studies/ records that were reviewed today include:   Echo 06/10/16. Study Conclusions  - Left ventricle: The cavity size was normal. Wall thickness was   normal. Systolic function was normal. The estimated ejection   fraction was in the  range of 55% to 60%. Wall motion was normal;   there were no regional wall motion abnormalities. - Aortic valve: There was mild stenosis. There was trivial   regurgitation. - Mitral valve: Moderately calcified annulus. There was mild   regurgitation. - Left atrium: The atrium was severely dilated. - Right atrium: The atrium was moderately dilated. - Pulmonary arteries: Systolic pressure was mildly increased. PA   peak pressure: 46 mm Hg (S).  Impressions:  - Normal LV systolic function; biatrial enlargement; calcified   aortic valve with mild AS with mean gradient 10 mmHg and trace   AI; mild MR; mild TR with mildly elevated pulmonary pressure.   ASSESSMENT AND PLAN:  1.  Permanent a fib with rate control.  occ increase of HR and understands she can take extra inderal if needed, now with fewer episodes.  Follow up with [name] in 6 months.   2.  Chronic anticoagulation and no bleeding.   3.   Hx CVA. Stable.   4.  Chronic diastolic HF and euvolemic today. Continue lasix.  5.  secondary pulmonary HTN    6.  Morbid obesity, has pool now and does exercise some.    7.  HTN stable.    Current medicines are reviewed with the patient today.  The patient Has no concerns regarding medicines.  The following changes have been made:  See above Labs/ tests ordered today include:see above  Disposition:   FU:  see above  Signed, Cecilie Kicks, NP  12/04/2017 4:45 PM    [facility] [address] [place], [place] Phone: [phone]; Fax: [phone]

## 2017-12-03 ENCOUNTER — Encounter: Payer: Self-pay | Admitting: Cardiology

## 2017-12-03 ENCOUNTER — Ambulatory Visit: Payer: PPO | Admitting: Cardiology

## 2017-12-03 VITALS — BP 120/60 | HR 79 | Ht 66.0 in | Wt 238.0 lb

## 2017-12-03 DIAGNOSIS — Z7901 Long term (current) use of anticoagulants: Secondary | ICD-10-CM

## 2017-12-03 DIAGNOSIS — I482 Chronic atrial fibrillation: Secondary | ICD-10-CM | POA: Diagnosis not present

## 2017-12-03 DIAGNOSIS — I272 Pulmonary hypertension, unspecified: Secondary | ICD-10-CM | POA: Diagnosis not present

## 2017-12-03 DIAGNOSIS — I5032 Chronic diastolic (congestive) heart failure: Secondary | ICD-10-CM

## 2017-12-03 DIAGNOSIS — I10 Essential (primary) hypertension: Secondary | ICD-10-CM | POA: Diagnosis not present

## 2017-12-03 DIAGNOSIS — I4821 Permanent atrial fibrillation: Secondary | ICD-10-CM

## 2017-12-03 NOTE — Patient Instructions (Signed)
Medication Instructions:  Your physician recommends that you continue on your current medications as directed. Please refer to the Current Medication list given to you today.   Labwork: NONE ORDERED TODAY  Testing/Procedures: NONE ORDERED TODAY  Follow-Up: 6 MONTHS WITH [name]  Any Other Special Instructions Will Be Listed Below (If Applicable).     If you need a refill on your cardiac medications before your next appointment, please call your pharmacy.

## 2017-12-14 ENCOUNTER — Other Ambulatory Visit: Payer: Self-pay | Admitting: Cardiology

## 2017-12-14 NOTE — Telephone Encounter (Signed)
Pt last saw Cecilie Kicks, NP on 12/03/17, last labs 07/17/17 Cr 1.08, age 77, weight 108kg, based on specified criteria pt is on appropriate dosage of Eliquis 5mg  BID.  Will refill rx.

## 2017-12-17 DIAGNOSIS — W540XXA Bitten by dog, initial encounter: Secondary | ICD-10-CM | POA: Diagnosis not present

## 2017-12-17 DIAGNOSIS — S81852A Open bite, left lower leg, initial encounter: Secondary | ICD-10-CM | POA: Diagnosis not present

## 2017-12-17 DIAGNOSIS — Z6839 Body mass index (BMI) 39.0-39.9, adult: Secondary | ICD-10-CM | POA: Diagnosis not present

## 2017-12-17 DIAGNOSIS — L03116 Cellulitis of left lower limb: Secondary | ICD-10-CM | POA: Diagnosis not present

## 2017-12-17 DIAGNOSIS — Z23 Encounter for immunization: Secondary | ICD-10-CM | POA: Diagnosis not present

## 2018-01-08 DIAGNOSIS — Z23 Encounter for immunization: Secondary | ICD-10-CM | POA: Diagnosis not present

## 2018-01-12 ENCOUNTER — Other Ambulatory Visit: Payer: Self-pay | Admitting: Pharmacist

## 2018-01-12 NOTE — Patient Outreach (Signed)
[facility]) Care Management  01/12/2018  Kristin Hood 23-Aug-1940 [id number]   Patient called to inquire about reordering Spiriva from [facility].  HIPAA identifiers were obtained. Patient was provided the phone number.   Plan: Patient's case will remain closed.  [name], PharmD, [facility] Clinical Pharmacist [phone]

## 2018-01-15 DIAGNOSIS — S8010XA Contusion of unspecified lower leg, initial encounter: Secondary | ICD-10-CM | POA: Diagnosis not present

## 2018-01-16 DIAGNOSIS — I110 Hypertensive heart disease with heart failure: Secondary | ICD-10-CM | POA: Diagnosis not present

## 2018-01-16 DIAGNOSIS — E079 Disorder of thyroid, unspecified: Secondary | ICD-10-CM | POA: Diagnosis not present

## 2018-01-16 DIAGNOSIS — S8010XA Contusion of unspecified lower leg, initial encounter: Secondary | ICD-10-CM | POA: Diagnosis not present

## 2018-01-16 DIAGNOSIS — S8011XA Contusion of right lower leg, initial encounter: Secondary | ICD-10-CM | POA: Diagnosis not present

## 2018-01-16 DIAGNOSIS — E785 Hyperlipidemia, unspecified: Secondary | ICD-10-CM | POA: Diagnosis not present

## 2018-01-16 DIAGNOSIS — Z8673 Personal history of transient ischemic attack (TIA), and cerebral infarction without residual deficits: Secondary | ICD-10-CM | POA: Diagnosis not present

## 2018-01-16 DIAGNOSIS — Z7901 Long term (current) use of anticoagulants: Secondary | ICD-10-CM | POA: Diagnosis not present

## 2018-01-16 DIAGNOSIS — J439 Emphysema, unspecified: Secondary | ICD-10-CM | POA: Diagnosis not present

## 2018-01-16 DIAGNOSIS — M79604 Pain in right leg: Secondary | ICD-10-CM | POA: Diagnosis not present

## 2018-01-16 DIAGNOSIS — I509 Heart failure, unspecified: Secondary | ICD-10-CM | POA: Diagnosis not present

## 2018-01-16 DIAGNOSIS — Z87448 Personal history of other diseases of urinary system: Secondary | ICD-10-CM | POA: Diagnosis not present

## 2018-01-16 DIAGNOSIS — R600 Localized edema: Secondary | ICD-10-CM | POA: Diagnosis not present

## 2018-01-18 ENCOUNTER — Other Ambulatory Visit: Payer: PPO

## 2018-01-20 ENCOUNTER — Ambulatory Visit (HOSPITAL_COMMUNITY)
Admission: RE | Admit: 2018-01-20 | Discharge: 2018-01-20 | Disposition: A | Discharge status: Home / Self Care | Payer: PPO | Source: Ambulatory Visit | Attending: Internal Medicine | Admitting: Internal Medicine

## 2018-01-20 ENCOUNTER — Inpatient Hospital Stay: Payer: PPO | Attending: Internal Medicine

## 2018-01-20 DIAGNOSIS — Z8673 Personal history of transient ischemic attack (TIA), and cerebral infarction without residual deficits: Secondary | ICD-10-CM | POA: Diagnosis not present

## 2018-01-20 DIAGNOSIS — R591 Generalized enlarged lymph nodes: Secondary | ICD-10-CM | POA: Diagnosis not present

## 2018-01-20 DIAGNOSIS — Z79899 Other long term (current) drug therapy: Secondary | ICD-10-CM | POA: Insufficient documentation

## 2018-01-20 DIAGNOSIS — J90 Pleural effusion, not elsewhere classified: Secondary | ICD-10-CM | POA: Insufficient documentation

## 2018-01-20 DIAGNOSIS — I110 Hypertensive heart disease with heart failure: Secondary | ICD-10-CM | POA: Insufficient documentation

## 2018-01-20 DIAGNOSIS — Z9221 Personal history of antineoplastic chemotherapy: Secondary | ICD-10-CM | POA: Diagnosis not present

## 2018-01-20 DIAGNOSIS — Z85850 Personal history of malignant neoplasm of thyroid: Secondary | ICD-10-CM | POA: Insufficient documentation

## 2018-01-20 DIAGNOSIS — R162 Hepatomegaly with splenomegaly, not elsewhere classified: Secondary | ICD-10-CM | POA: Insufficient documentation

## 2018-01-20 DIAGNOSIS — I5032 Chronic diastolic (congestive) heart failure: Secondary | ICD-10-CM | POA: Insufficient documentation

## 2018-01-20 DIAGNOSIS — Z96642 Presence of left artificial hip joint: Secondary | ICD-10-CM | POA: Diagnosis not present

## 2018-01-20 DIAGNOSIS — E782 Mixed hyperlipidemia: Secondary | ICD-10-CM | POA: Insufficient documentation

## 2018-01-20 DIAGNOSIS — Z9012 Acquired absence of left breast and nipple: Secondary | ICD-10-CM | POA: Insufficient documentation

## 2018-01-20 DIAGNOSIS — I482 Chronic atrial fibrillation, unspecified: Secondary | ICD-10-CM | POA: Diagnosis not present

## 2018-01-20 DIAGNOSIS — K449 Diaphragmatic hernia without obstruction or gangrene: Secondary | ICD-10-CM | POA: Diagnosis not present

## 2018-01-20 DIAGNOSIS — K219 Gastro-esophageal reflux disease without esophagitis: Secondary | ICD-10-CM | POA: Diagnosis not present

## 2018-01-20 DIAGNOSIS — R918 Other nonspecific abnormal finding of lung field: Secondary | ICD-10-CM | POA: Insufficient documentation

## 2018-01-20 DIAGNOSIS — Z853 Personal history of malignant neoplasm of breast: Secondary | ICD-10-CM | POA: Insufficient documentation

## 2018-01-20 DIAGNOSIS — J449 Chronic obstructive pulmonary disease, unspecified: Secondary | ICD-10-CM | POA: Insufficient documentation

## 2018-01-20 DIAGNOSIS — Z7901 Long term (current) use of anticoagulants: Secondary | ICD-10-CM | POA: Diagnosis not present

## 2018-01-20 DIAGNOSIS — K8020 Calculus of gallbladder without cholecystitis without obstruction: Secondary | ICD-10-CM | POA: Diagnosis not present

## 2018-01-20 DIAGNOSIS — R590 Localized enlarged lymph nodes: Secondary | ICD-10-CM | POA: Insufficient documentation

## 2018-01-20 DIAGNOSIS — K5730 Diverticulosis of large intestine without perforation or abscess without bleeding: Secondary | ICD-10-CM | POA: Diagnosis not present

## 2018-01-20 DIAGNOSIS — R161 Splenomegaly, not elsewhere classified: Secondary | ICD-10-CM | POA: Insufficient documentation

## 2018-01-20 LAB — CMP (CANCER CENTER ONLY)
ALBUMIN: 3.4 g/dL — AB (ref 3.5–5.0)
ALT: 10 U/L (ref 0–44)
AST: 21 U/L (ref 15–41)
Alkaline Phosphatase: 26 U/L — ABNORMAL LOW (ref 38–126)
Anion gap: 10 (ref 5–15)
BUN: 29 mg/dL — AB (ref 8–23)
CHLORIDE: 107 mmol/L (ref 98–111)
CO2: 23 mmol/L (ref 22–32)
Calcium: 9.1 mg/dL (ref 8.9–10.3)
Creatinine: 1.41 mg/dL — ABNORMAL HIGH (ref 0.44–1.00)
GFR, EST NON AFRICAN AMERICAN: 35 mL/min — AB (ref >60)
GFR, Est AFR Am: 40 mL/min — ABNORMAL LOW (ref >60)
GLUCOSE: 115 mg/dL — AB (ref 70–99)
Potassium: 4.1 mmol/L (ref 3.5–5.1)
Sodium: 140 mmol/L (ref 135–145)
Total Bilirubin: 1 mg/dL (ref 0.3–1.2)
Total Protein: 7.7 g/dL (ref 6.5–8.1)

## 2018-01-20 LAB — CBC WITH DIFFERENTIAL (CANCER CENTER ONLY)
ABS IMMATURE GRANULOCYTES: 0.01 10*3/uL (ref 0.00–0.07)
Basophils Absolute: 0 10*3/uL (ref 0.0–0.1)
Basophils Relative: 0 %
EOS PCT: 2 %
Eosinophils Absolute: 0.1 10*3/uL (ref 0.0–0.5)
HEMATOCRIT: 31.8 % — AB (ref 36.0–46.0)
HEMOGLOBIN: 9.8 g/dL — AB (ref 12.0–15.0)
Immature Granulocytes: 0 %
LYMPHS ABS: 0.4 10*3/uL — AB (ref 0.7–4.0)
LYMPHS PCT: 12 %
MCH: 28.5 pg (ref 26.0–34.0)
MCHC: 30.8 g/dL (ref 30.0–36.0)
MCV: 92.4 fL (ref 80.0–100.0)
MONOS PCT: 12 %
Monocytes Absolute: 0.4 10*3/uL (ref 0.1–1.0)
Neutro Abs: 2.2 10*3/uL (ref 1.7–7.7)
Neutrophils Relative %: 74 %
Platelet Count: 244 10*3/uL (ref 150–400)
RBC: 3.44 MIL/uL — ABNORMAL LOW (ref 3.87–5.11)
RDW: 16.2 % — ABNORMAL HIGH (ref 11.5–15.5)
WBC: 3 10*3/uL — AB (ref 4.0–10.5)
nRBC: 0 % (ref 0.0–0.2)

## 2018-01-20 LAB — LACTATE DEHYDROGENASE: LDH: 194 U/L — AB (ref 98–192)

## 2018-01-20 MED ORDER — IOHEXOL 300 MG/ML  SOLN
80.0000 mL | Freq: Once | INTRAMUSCULAR | Status: AC | PRN
  Administered 2018-01-20: 80 mL via INTRAVENOUS

## 2018-01-20 MED ORDER — SODIUM CHLORIDE 0.9 % IJ SOLN
INTRAMUSCULAR | Status: AC
  Filled 2018-01-20: qty 50

## 2018-01-22 DIAGNOSIS — M2031 Hallux varus (acquired), right foot: Secondary | ICD-10-CM | POA: Diagnosis not present

## 2018-01-22 DIAGNOSIS — M2032 Hallux varus (acquired), left foot: Secondary | ICD-10-CM | POA: Diagnosis not present

## 2018-01-22 DIAGNOSIS — L84 Corns and callosities: Secondary | ICD-10-CM | POA: Diagnosis not present

## 2018-01-25 ENCOUNTER — Inpatient Hospital Stay (HOSPITAL_BASED_OUTPATIENT_CLINIC_OR_DEPARTMENT_OTHER): Payer: PPO | Admitting: Internal Medicine

## 2018-01-25 ENCOUNTER — Encounter: Payer: Self-pay | Admitting: Internal Medicine

## 2018-01-25 ENCOUNTER — Telehealth: Payer: Self-pay

## 2018-01-25 ENCOUNTER — Telehealth: Payer: Self-pay | Admitting: Internal Medicine

## 2018-01-25 VITALS — BP 144/69 | HR 81 | Temp 98.2°F | Resp 20 | Ht 66.0 in | Wt 233.7 lb

## 2018-01-25 DIAGNOSIS — Z9221 Personal history of antineoplastic chemotherapy: Secondary | ICD-10-CM

## 2018-01-25 DIAGNOSIS — Z85850 Personal history of malignant neoplasm of thyroid: Secondary | ICD-10-CM | POA: Diagnosis not present

## 2018-01-25 DIAGNOSIS — R590 Localized enlarged lymph nodes: Secondary | ICD-10-CM

## 2018-01-25 DIAGNOSIS — R162 Hepatomegaly with splenomegaly, not elsewhere classified: Secondary | ICD-10-CM

## 2018-01-25 DIAGNOSIS — Z9012 Acquired absence of left breast and nipple: Secondary | ICD-10-CM

## 2018-01-25 DIAGNOSIS — R591 Generalized enlarged lymph nodes: Secondary | ICD-10-CM

## 2018-01-25 DIAGNOSIS — Z853 Personal history of malignant neoplasm of breast: Secondary | ICD-10-CM

## 2018-01-25 NOTE — Telephone Encounter (Signed)
Scheduled appt per 10/28 sch message - pt is aware of appt date and time.   

## 2018-01-25 NOTE — Progress Notes (Signed)
[facility] [phone]   [phone]  OFFICE PROGRESS NOTE  [name], MD [place] [address]  DIAGNOSIS: Persistent lymphadenopathy in the mediastinum and retroperitoneum as well as splenomegaly suspicious for lymphoproliferative disorder but has been stable for the last 42 months. 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  PRIOR THERAPY: None.  CURRENT THERAPY: Observation.  INTERVAL HISTORY: Kristin Hood 77 y.o. female returns to the clinic for follow-up visit accompanied by her daughter.  The patient is feeling fine today with no specific complaints except for fatigue.  She denied having any recent weight loss or night sweats.  She has no nausea, vomiting, diarrhea or constipation.  She has no chest pain, shortness of breath except with exertion with no cough or hemoptysis.  She denied having any fever or chills.  She denied having any headache or visual changes.  The patient is here today for evaluation with repeat blood work and CT scan of the chest, abdomen and pelvis for evaluation of her lymphadenopathy.  MEDICAL HISTORY: Past Medical History:  Diagnosis Date  . Acute congestive heart failure ([place])   . Acute diastolic CHF (congestive heart failure) ([place]) 12/30/2016  . Acute respiratory failure with hypoxemia ([place]) 06/09/2016  . Acute respiratory failure with hypoxia ([place]) 12/30/2016  . Anginal pain ([place])   . ARF (acute renal failure) ([place]) 06/11/2016  . Arthritis   . Atrial fibrillation ([place]) 07/2012   Eliquis, rate controlled  . Benign hypertensive heart disease without heart failure 12/25/2012  . Breast cancer ([place]) 2001   left mastectomy  . Chest pain 12/11/2012  . CHF (congestive heart failure) ([place]) 06/10/2016  . Chronic atrial fibrillation ([place]) 11/16/2014  . Chronic diastolic heart failure ([place]) 02/13/2014  .  Coarse tremors    , Essential  . Complication of anesthesia    SMALLER TUBE FOR INTUBATION AS GAGS ON TUBE  . COPD (chronic obstructive pulmonary disease) ([place])   . COPD exacerbation ([place]) 12/30/2016  . CVA (cerebral vascular accident) ([place]) 2013  . Dysrhythmia 08/20/2012   Atrial Fibrillation  . Essential and other specified forms of tremor 06/18/2012  . Essential hypertension 06/18/2012  . Family history of adverse reaction to anesthesia    mother experienced pain with intubation   . GERD (gastroesophageal reflux disease)    history of   . Hard of hearing   . Heart murmur   . History of chemotherapy 11/13/1998 to 2010   Adriamycin/Docotaxol, Dexorubicin/Taxotere, Femara, Tamoxifen  . History of CVA (cerebrovascular accident) 12/11/2012  . History of hiatal hernia    history of   . Hx of adenomatous colonic polyps 2006   , Benign  . Hypercholesteremia   . Hyperlipidemia 02/13/2014  . Hypertension    , With mild concentric left ventricular hypertrophy  . Long term current use of anticoagulant therapy 12/25/2012  . Mixed dyslipidemia   . Mixed hyperlipidemia 12/25/2012  . Morbid obesity ([place]) 06/18/2012  . Phlebitis and thrombophlebitis of superficial vessels of lower extremities 12/25/2012  . 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 Pure hypercholesterolemia 12/25/2012  . Shingles   . Shortness of breath dyspnea    on exertion   . SOB (shortness of breath) 11/16/2014  . Spider veins   . Stroke ([place]) 11/17/2011   left side brain-speech-TPA  . Thyroid nodule   . Tremor, essential   . Unspecified cerebral artery occlusion with  cerebral infarction 11/18/2011  . Valvular sclerosis 09/23/2003   without stenosis  . Varicose veins     ALLERGIES:  is allergic to floxin [ofloxacin].  MEDICATIONS:  Current Outpatient Medications  Medication Sig Dispense Refill  . albuterol (PROVENTIL HFA;VENTOLIN HFA) 108 (90 Base) MCG/ACT inhaler Inhale 2 puffs into the lungs every 4 (four) hours as  needed for wheezing or shortness of breath. 1 Inhaler 5  . amLODipine (NORVASC) 5 MG tablet Take 5 mg by mouth daily.    Marland Kitchen atorvastatin (LIPITOR) 20 MG tablet Take 10 mg by mouth daily after supper.     Marland Kitchen ELIQUIS 5 MG TABS tablet TAKE 1 TABLET BY MOUTH TWICE DAILY 180 tablet 2  . fenofibrate 160 MG tablet Take 160 mg by mouth daily after supper.     . ferrous sulfate 325 (65 FE) MG EC tablet Take 325 mg by mouth daily after supper.     . fluticasone (FLONASE) 50 MCG/ACT nasal spray Place 2 sprays into both nostrils as needed for allergies or rhinitis.    . furosemide (LASIX) 20 MG tablet Take 1 tablet (20 mg total) by mouth daily. May take extra tablet daily as needed for weight gain 120 tablet 3  . ketoconazole (NIZORAL) 2 % cream pt uses 1 application topically daily as needed for rash  0  . levothyroxine (SYNTHROID, LEVOTHROID) 175 MCG tablet TAKE 1 TABLET BY MOUTH DAILY BEFORE BREAKFAST. 90 tablet 3  . losartan (COZAAR) 100 MG tablet Take 100 mg by mouth daily.    . Multiple Vitamin (MULTIVITAMIN WITH MINERALS) TABS tablet Take 1 tablet by mouth daily.    . potassium chloride (K-DUR) 10 MEQ tablet Take 1 tablet (10 mEq total) by mouth daily. Take extra tablet by mouth when taking extra furosemide 120 tablet 3  . propranolol ER (INDERAL LA) 60 MG 24 hr capsule TAKE 2 CAPS IN THE MORNING TAKE 1 CAP IN THE AFTERNOON, 1 CAP AT NIGHT 360 capsule 2  . Pseudoephedrine-Guaifenesin (MUCINEX D PO) Take 1 capsule by mouth daily.     . Tiotropium Bromide Monohydrate (SPIRIVA RESPIMAT) 2.5 MCG/ACT AERS Inhale 2 puffs into the lungs daily. 3 Inhaler 4   No current facility-administered medications for this visit.     SURGICAL HISTORY:  Past Surgical History:  Procedure Laterality Date  . APPENDECTOMY  07/10/1957  . BIOPSY THYROID  12/01/2013   ultrasound  . BREAST SURGERY    . BUNIONECTOMY  11/29/1988   bilateral with hammer toes  . CARDIAC CATHETERIZATION  07/2010   , Without significant CAD  .  CARDIAC CATHETERIZATION  07/2011   [name]  . CESAREAN SECTION    . CESAREAN SECTION  05/1968, 07/1965  . EYE SURGERY  01/31/2013   eye lid; cataract surgery bilat   . JOINT REPLACEMENT  05/2001   left knee  . JOINT REPLACEMENT  11/2001   right knee  . JOINT REPLACEMENT  01/2008   redo right knee  . KNEE SURGERY  2009   ,Total knee replacement  . LEFT HEART CATHETERIZATION WITH CORONARY ANGIOGRAM N/A 08/14/2011   Procedure: LEFT HEART CATHETERIZATION WITH CORONARY ANGIOGRAM;  Surgeon: [name], MD;  Location: [facility] CATH LAB;  Service: Cardiovascular;  Laterality: N/A;  . Left Rotator Cuff    . MASTECTOMY  10/19/1998   Radical, left -with lymph nodes (8 total)  . Reverse bunionectomy  08/29/1993   removal of Tibial Sesamoid-left  . TEE WITHOUT CARDIOVERSION  11/19/2011   Procedure: TRANSESOPHAGEAL ECHOCARDIOGRAM (  TEE);  Surgeon: [name], MD;  Location: [facility] ENDOSCOPY;  Service: Cardiovascular;  Laterality: N/A;  . THYROIDECTOMY N/A 03/02/2015   Procedure: TOTAL THYROIDECTOMY;  Surgeon: Armandina Gemma, MD;  Location: [facility] ORS;  Service: General;  Laterality: N/A;  . TONSILLECTOMY    . TOTAL HIP ARTHROPLASTY Left 10/11/2014   Procedure: LEFT TOTAL HIP ARTHROPLASTY ANTERIOR APPROACH;  Surgeon: [name], MD;  Location: [facility] ORS;  Service: Orthopedics;  Laterality: Left;    REVIEW OF SYSTEMS:  Constitutional: positive for fatigue Eyes: negative Ears, nose, mouth, throat, and face: negative Respiratory: positive for dyspnea on exertion Cardiovascular: negative Gastrointestinal: negative Genitourinary:negative Integument/breast: negative Hematologic/lymphatic: negative Musculoskeletal:negative Neurological: negative Behavioral/Psych: negative Endocrine: negative Allergic/Immunologic: negative   PHYSICAL EXAMINATION: General appearance: alert, cooperative, fatigued and no distress Head: Normocephalic, without obvious abnormality, atraumatic Neck: no adenopathy, no JVD, supple,  symmetrical, trachea midline and thyroid not enlarged, symmetric, no tenderness/mass/nodules Lymph nodes: Cervical, supraclavicular, and axillary nodes normal. Resp: clear to auscultation bilaterally Back: symmetric, no curvature. ROM normal. No CVA tenderness. Cardio: regular rate and rhythm, S1, S2 normal, no murmur, click, rub or gallop GI: soft, non-tender; bowel sounds normal; no masses,  no organomegaly Extremities: extremities normal, atraumatic, no cyanosis or edema Neurologic: Alert and oriented X 3, normal strength and tone. Normal symmetric reflexes. Normal coordination and gait  ECOG PERFORMANCE STATUS: 1 - Symptomatic but completely ambulatory  Blood pressure (!) 144/69, pulse 81, temperature 98.2 F (36.8 C), temperature source Oral, resp. rate 20, height '5\' 6"'  (1.676 m), weight 233 lb 11.2 oz (106 kg), SpO2 93 %.  LABORATORY DATA: Lab Results  Component Value Date   WBC 3.0 (L) 01/20/2018   HGB 9.8 (L) 01/20/2018   HCT 31.8 (L) 01/20/2018   MCV 92.4 01/20/2018   PLT 244 01/20/2018      Chemistry      Component Value Date/Time   NA 140 01/20/2018 1001   NA 142 05/21/2017 1027   NA 141 07/15/2016 0906   K 4.1 01/20/2018 1001   K 4.0 07/15/2016 0906   CL 107 01/20/2018 1001   CO2 23 01/20/2018 1001   CO2 25 07/15/2016 0906   BUN 29 (H) 01/20/2018 1001   BUN 33 (H) 05/21/2017 1027   BUN 20.7 07/15/2016 0906   CREATININE 1.41 (H) 01/20/2018 1001   CREATININE 1.1 07/15/2016 0906      Component Value Date/Time   CALCIUM 9.1 01/20/2018 1001   CALCIUM 9.6 07/15/2016 0906   ALKPHOS 26 (L) 01/20/2018 1001   ALKPHOS 29 (L) 07/15/2016 0906   AST 21 01/20/2018 1001   AST 19 07/15/2016 0906   ALT 10 01/20/2018 1001   ALT 13 07/15/2016 0906   BILITOT 1.0 01/20/2018 1001   BILITOT 1.13 07/15/2016 0906       RADIOGRAPHIC STUDIES: Ct Chest W Contrast  Result Date: 01/21/2018 CLINICAL DATA:  Patient with history of lymphoma. History of left breast cancer.  Re-evaluation. EXAM: CT CHEST, ABDOMEN, AND PELVIS WITH CONTRAST TECHNIQUE: Multidetector CT imaging of the chest, abdomen and pelvis was performed following the standard protocol during bolus administration of intravenous contrast. CONTRAST:  62m OMNIPAQUE IOHEXOL 300 MG/ML  SOLN COMPARISON:  CT CAP 07/14/2017 FINDINGS: CT CHEST FINDINGS Cardiovascular: Heart is enlarged. Coronary arterial vascular calcifications. Thoracic aortic vascular calcifications. Mediastinum/Nodes: Unchanged 9 mm lymph node at the thoracic inlet (image 4; series 2). Unchanged 1.2 cm paratracheal lymph node (image 19; series 2). Similar-appearing right paraesophageal lymph node measuring up to 3 mm in thickness (  image 42; series 2). Unchanged 0.6 cm prevascular lymph node (image 18; series 2). Increased right subpectoral lymph node measuring 9 mm (image 58; series 5), previously 6 mm. Lungs/Pleura: Central airways are patent. Similar-appearing patchy ground-glass pulmonary opacities. Unchanged 3 mm nodule in the lingula (image 64; series 4). Tiny right pleural effusion. No pneumothorax. Musculoskeletal: Thoracic spine degenerative changes. No aggressive or acute appearing osseous lesions. Prior left mastectomy. CT ABDOMEN PELVIS FINDINGS Hepatobiliary: Mild nodularity of the hepatic contour. No focal hepatic lesion identified. Cholelithiasis. No intrahepatic or extrahepatic biliary ductal dilatation. Pancreas: Unremarkable Spleen: Increased in size measuring 17.7 cm (image 60; series 2). Adrenals/Urinary Tract: Normal adrenal glands. Kidneys enhance symmetrically with contrast. No hydronephrosis. Urinary bladder is unremarkable. Stomach/Bowel: Oral contrast material to the rectum. Sigmoid colonic diverticulosis. No CT evidence for acute diverticulitis. No evidence for small bowel obstruction. No free intraperitoneal air. Small hiatal hernia. Normal morphology of the stomach. Vascular/Lymphatic: Normal caliber abdominal aorta. Peripheral  calcified atherosclerotic plaque. Interval increase in left periaortic adenopathy measuring up to 3.4 cm (image 73; series 2), previously 2.4 cm. Similar-appearing 1.6 cm porta hepatic lymph node (image 66; series 2). Interval increase in thickness of aortocaval adenopathy measuring 2.5 cm (image 75; series 2), previously 2.0 cm. Reproductive: Calcified fibroids. Other: Interval development of small amount of fluid within the abdomen and pelvis. Musculoskeletal: Left hip arthroplasty. Lumbar spine degenerative changes. No aggressive or acute appearing osseous lesions. IMPRESSION: Slight interval increase in size of retroperitoneal adenopathy. Mild interval increase in size of right subpectoral lymph node. Interval increase in size of splenomegaly. Interval development of small amount of fluid within the retroperitoneum and pelvis, nonspecific in etiology. Scattered ground-glass opacities within the lungs which may represent chronic lung disease such as hypersensitivity pneumonitis. Electronically Signed   By: Lovey Newcomer M.D.   On: 01/21/2018 09:12   Ct Abdomen Pelvis W Contrast  Result Date: 01/21/2018 CLINICAL DATA:  Patient with history of lymphoma. History of left breast cancer. Re-evaluation. EXAM: CT CHEST, ABDOMEN, AND PELVIS WITH CONTRAST TECHNIQUE: Multidetector CT imaging of the chest, abdomen and pelvis was performed following the standard protocol during bolus administration of intravenous contrast. CONTRAST:  65m OMNIPAQUE IOHEXOL 300 MG/ML  SOLN COMPARISON:  CT CAP 07/14/2017 FINDINGS: CT CHEST FINDINGS Cardiovascular: Heart is enlarged. Coronary arterial vascular calcifications. Thoracic aortic vascular calcifications. Mediastinum/Nodes: Unchanged 9 mm lymph node at the thoracic inlet (image 4; series 2). Unchanged 1.2 cm paratracheal lymph node (image 19; series 2). Similar-appearing right paraesophageal lymph node measuring up to 3 mm in thickness (image 42; series 2). Unchanged 0.6 cm  prevascular lymph node (image 18; series 2). Increased right subpectoral lymph node measuring 9 mm (image 58; series 5), previously 6 mm. Lungs/Pleura: Central airways are patent. Similar-appearing patchy ground-glass pulmonary opacities. Unchanged 3 mm nodule in the lingula (image 64; series 4). Tiny right pleural effusion. No pneumothorax. Musculoskeletal: Thoracic spine degenerative changes. No aggressive or acute appearing osseous lesions. Prior left mastectomy. CT ABDOMEN PELVIS FINDINGS Hepatobiliary: Mild nodularity of the hepatic contour. No focal hepatic lesion identified. Cholelithiasis. No intrahepatic or extrahepatic biliary ductal dilatation. Pancreas: Unremarkable Spleen: Increased in size measuring 17.7 cm (image 60; series 2). Adrenals/Urinary Tract: Normal adrenal glands. Kidneys enhance symmetrically with contrast. No hydronephrosis. Urinary bladder is unremarkable. Stomach/Bowel: Oral contrast material to the rectum. Sigmoid colonic diverticulosis. No CT evidence for acute diverticulitis. No evidence for small bowel obstruction. No free intraperitoneal air. Small hiatal hernia. Normal morphology of the stomach. Vascular/Lymphatic: Normal caliber abdominal aorta.  Peripheral calcified atherosclerotic plaque. Interval increase in left periaortic adenopathy measuring up to 3.4 cm (image 73; series 2), previously 2.4 cm. Similar-appearing 1.6 cm porta hepatic lymph node (image 66; series 2). Interval increase in thickness of aortocaval adenopathy measuring 2.5 cm (image 75; series 2), previously 2.0 cm. Reproductive: Calcified fibroids. Other: Interval development of small amount of fluid within the abdomen and pelvis. Musculoskeletal: Left hip arthroplasty. Lumbar spine degenerative changes. No aggressive or acute appearing osseous lesions. IMPRESSION: Slight interval increase in size of retroperitoneal adenopathy. Mild interval increase in size of right subpectoral lymph node. Interval increase in  size of splenomegaly. Interval development of small amount of fluid within the retroperitoneum and pelvis, nonspecific in etiology. Scattered ground-glass opacities within the lungs which may represent chronic lung disease such as hypersensitivity pneumonitis. Electronically Signed   By: Lovey Newcomer M.D.   On: 01/21/2018 09:12    ASSESSMENT AND PLAN:  This is a very pleasant 77 years old white female with persistent lymphadenopathy as well as mild hepatosplenomegaly suspicious for low-grade lymphoma. The patient is currently on observation. She had a repeat CT scan of the chest, abdomen and pelvis that showed further increase in the lymphadenopathy in the chest and retroperitoneum. I discussed the scan result and showed the images to the patient and her daughter. I recommended for the patient to proceed with CT-guided core biopsy of the retroperitoneal lymph node by interventional radiology. I will also arrange for the patient to have a bone marrow biopsy and aspirate in the next 1-2 weeks to rule out underlying lymphoproliferative disorder specially with the anemia and leukocytopenia. I will see the patient back for follow-up visit in 3-4 weeks for evaluation and discussion of her biopsy results and further recommendation regarding treatment of her condition. For hypertension the patient was advised to take her blood pressure medication as prescribed and to discuss with her primary care physician for adjustment of her medication if needed. She was advised to call immediately if she has any concerning symptoms in the interval. The patient voices understanding of current disease status and treatment options and is in agreement with the current care plan. All questions were answered. The patient knows to call the clinic with any problems, questions or concerns. We can certainly see the patient much sooner if necessary.  Disclaimer: This note was dictated with voice recognition software. Similar  sounding words can inadvertently be transcribed and may not be corrected upon review.

## 2018-01-25 NOTE — Telephone Encounter (Signed)
Printed avs and calender of upcoming appointment. Per 10/28 los 

## 2018-01-26 ENCOUNTER — Telehealth: Payer: Self-pay | Admitting: Pharmacist

## 2018-01-26 NOTE — Telephone Encounter (Addendum)
Message sent to [facility] for pt to resume Eliquis ASAP after procedure due to hx of stroke.

## 2018-01-26 NOTE — Telephone Encounter (Signed)
-----   Message from Jerline Pain, MD sent at 01/26/2018  7:09 AM EDT ----- Regarding: RE: Eliquis OK to hold Eliquis for 2 days for biopsy ordered by Dr. Inda Merlin. Has had prior stroke. Would not recommend extended Eliquis hold given prior stroke.   [name], MD  ----- Message ----- From: Roosvelt Maser Sent: 01/25/2018   9:32 AM EDT To: Jerline Pain, MD Subject: Eliquis                                         [name],  Dr. [name] has ordered a biopsy for this patient.  She will need to hold her Eliquis 2 days prior to having this procedure  Can she hold this bloodthinner?  Thank you, [facility] Radiology scheduler

## 2018-01-28 ENCOUNTER — Inpatient Hospital Stay: Payer: PPO | Admitting: Adult Health

## 2018-01-28 ENCOUNTER — Telehealth: Payer: Self-pay | Admitting: Adult Health

## 2018-01-28 ENCOUNTER — Inpatient Hospital Stay: Payer: PPO

## 2018-01-28 ENCOUNTER — Encounter: Payer: Self-pay | Admitting: Adult Health

## 2018-01-28 VITALS — BP 139/83 | HR 73 | Temp 97.8°F | Resp 16

## 2018-01-28 DIAGNOSIS — C8519 Unspecified B-cell lymphoma, extranodal and solid organ sites: Secondary | ICD-10-CM | POA: Diagnosis not present

## 2018-01-28 DIAGNOSIS — R591 Generalized enlarged lymph nodes: Secondary | ICD-10-CM

## 2018-01-28 DIAGNOSIS — R590 Localized enlarged lymph nodes: Secondary | ICD-10-CM | POA: Diagnosis not present

## 2018-01-28 DIAGNOSIS — D649 Anemia, unspecified: Secondary | ICD-10-CM | POA: Diagnosis not present

## 2018-01-28 LAB — CBC WITH DIFFERENTIAL (CANCER CENTER ONLY)
Abs Immature Granulocytes: 0.02 10*3/uL (ref 0.00–0.07)
Basophils Absolute: 0 10*3/uL (ref 0.0–0.1)
Basophils Relative: 1 %
EOS ABS: 0.1 10*3/uL (ref 0.0–0.5)
EOS PCT: 2 %
HEMATOCRIT: 34.4 % — AB (ref 36.0–46.0)
HEMOGLOBIN: 10.4 g/dL — AB (ref 12.0–15.0)
Immature Granulocytes: 1 %
LYMPHS ABS: 0.5 10*3/uL — AB (ref 0.7–4.0)
LYMPHS PCT: 11 %
MCH: 28.4 pg (ref 26.0–34.0)
MCHC: 30.2 g/dL (ref 30.0–36.0)
MCV: 94 fL (ref 80.0–100.0)
Monocytes Absolute: 0.4 10*3/uL (ref 0.1–1.0)
Monocytes Relative: 9 %
Neutro Abs: 3.2 10*3/uL (ref 1.7–7.7)
Neutrophils Relative %: 76 %
Platelet Count: 287 10*3/uL (ref 150–400)
RBC: 3.66 MIL/uL — ABNORMAL LOW (ref 3.87–5.11)
RDW: 16 % — ABNORMAL HIGH (ref 11.5–15.5)
WBC: 4.2 10*3/uL (ref 4.0–10.5)
nRBC: 0 % (ref 0.0–0.2)

## 2018-01-28 MED ORDER — LIDOCAINE HCL 2 % IJ SOLN
INTRAMUSCULAR | Status: AC
  Filled 2018-01-28: qty 20

## 2018-01-28 NOTE — Patient Instructions (Signed)
Bone Marrow Aspiration and Bone Marrow Biopsy, Adult, Care After This sheet gives you information about how to care for yourself after your procedure. Your health care provider may also give you more specific instructions. If you have problems or questions, contact your health care provider. What can I expect after the procedure? After the procedure, it is common to have:  Mild pain and tenderness.  Swelling.  Bruising.  Follow these instructions at home:  Take over-the-counter or prescription medicines only as told by your health care provider.  Do not take baths, swim, or use a hot tub until your health care provider approves. Ask if you can take a shower or have a sponge bath.  Follow instructions from your health care provider about how to take care of the puncture site. Make sure you: ? Wash your hands with soap and water before you change your bandage (dressing). If soap and water are not available, use hand sanitizer. ? Change your dressing as told by your health care provider.  Check your puncture siteevery day for signs of infection. Check for: ? More redness, swelling, or pain. ? More fluid or blood. ? Warmth. ? Pus or a bad smell.  Return to your normal activities as told by your health care provider. Ask your health care provider what activities are safe for you.  Do not drive for 24 hours if you were given a medicine to help you relax (sedative).  Keep all follow-up visits as told by your health care provider. This is important. Contact a health care provider if:  You have more redness, swelling, or pain around the puncture site.  You have more fluid or blood coming from the puncture site.  Your puncture site feels warm to the touch.  You have pus or a bad smell coming from the puncture site.  You have a fever.  Your pain is not controlled with medicine. This information is not intended to replace advice given to you by your health care provider. Make sure  you discuss any questions you have with your health care provider. Document Released: 10/04/2004 Document Revised: 10/05/2015 Document Reviewed: 08/29/2015 Elsevier Interactive Patient Education  2018 [employer].

## 2018-01-28 NOTE — Telephone Encounter (Signed)
No 10/31 los orders.

## 2018-01-28 NOTE — Progress Notes (Signed)
At 0900, procedure site check revealed blood-saturated gauze. Gauze removed. No active bleeding noted. Area surrounding procedure site soft and non-tender. Dressing changed. Further site checks revealed no active bleeding - hemostasis achieved. Patient denies discomfort at site. Discharge instructions reviewed with patient and daughter and discharged without further incident.

## 2018-01-28 NOTE — Progress Notes (Signed)
INDICATION: r/o lymphoproliferative disorder, lymphadenopathy   Bone Marrow Biopsy and Aspiration Procedure Note   Informed consent was obtained and potential risks including bleeding, infection and pain were reviewed with the patient.  The patient's name, date of birth, identification, consent and allergies were verified prior to the start of procedure and time out was performed.  The right posterior iliac crest was chosen as the site of biopsy.  The skin was prepped with ChloraPrep.   16 cc of 2% lidocaine was used to provide local anaesthesia.   10 cc of bone marrow aspirate was obtained followed by 0.6cm biopsy.  Pressure was applied to the biopsy site and bandage was placed over the biopsy site. Patient was made to lie on the back for 45 mins prior to discharge.  The procedure was tolerated well. COMPLICATIONS: None BLOOD LOSS: none The patient was discharged home in stable condition with a follow up to review results.  Patient was provided with post bone marrow biopsy instructions and instructed to call if there was any bleeding or worsening pain.  Specimens sent for flow cytometry, cytogenetics and additional studies.  Signed Scot Dock, NP

## 2018-02-04 ENCOUNTER — Other Ambulatory Visit: Payer: Self-pay | Admitting: Physician Assistant

## 2018-02-05 ENCOUNTER — Encounter (HOSPITAL_COMMUNITY): Payer: Self-pay

## 2018-02-05 ENCOUNTER — Ambulatory Visit (HOSPITAL_COMMUNITY)
Admission: RE | Admit: 2018-02-05 | Discharge: 2018-02-05 | Disposition: A | Discharge status: Home / Self Care | Payer: PPO | Source: Ambulatory Visit | Attending: Internal Medicine | Admitting: Internal Medicine

## 2018-02-05 ENCOUNTER — Other Ambulatory Visit: Payer: Self-pay

## 2018-02-05 DIAGNOSIS — Z96642 Presence of left artificial hip joint: Secondary | ICD-10-CM | POA: Insufficient documentation

## 2018-02-05 DIAGNOSIS — Z6837 Body mass index (BMI) 37.0-37.9, adult: Secondary | ICD-10-CM | POA: Insufficient documentation

## 2018-02-05 DIAGNOSIS — Z86010 Personal history of colonic polyps: Secondary | ICD-10-CM | POA: Diagnosis not present

## 2018-02-05 DIAGNOSIS — I482 Chronic atrial fibrillation, unspecified: Secondary | ICD-10-CM | POA: Insufficient documentation

## 2018-02-05 DIAGNOSIS — C8596 Non-Hodgkin lymphoma, unspecified, intrapelvic lymph nodes: Secondary | ICD-10-CM | POA: Diagnosis not present

## 2018-02-05 DIAGNOSIS — Z9221 Personal history of antineoplastic chemotherapy: Secondary | ICD-10-CM | POA: Insufficient documentation

## 2018-02-05 DIAGNOSIS — Z7901 Long term (current) use of anticoagulants: Secondary | ICD-10-CM | POA: Diagnosis not present

## 2018-02-05 DIAGNOSIS — Z8249 Family history of ischemic heart disease and other diseases of the circulatory system: Secondary | ICD-10-CM | POA: Insufficient documentation

## 2018-02-05 DIAGNOSIS — R591 Generalized enlarged lymph nodes: Secondary | ICD-10-CM

## 2018-02-05 DIAGNOSIS — Z9049 Acquired absence of other specified parts of digestive tract: Secondary | ICD-10-CM | POA: Insufficient documentation

## 2018-02-05 DIAGNOSIS — C8513 Unspecified B-cell lymphoma, intra-abdominal lymph nodes: Secondary | ICD-10-CM | POA: Diagnosis not present

## 2018-02-05 DIAGNOSIS — Z8673 Personal history of transient ischemic attack (TIA), and cerebral infarction without residual deficits: Secondary | ICD-10-CM | POA: Insufficient documentation

## 2018-02-05 DIAGNOSIS — Z853 Personal history of malignant neoplasm of breast: Secondary | ICD-10-CM | POA: Insufficient documentation

## 2018-02-05 DIAGNOSIS — Z881 Allergy status to other antibiotic agents status: Secondary | ICD-10-CM | POA: Insufficient documentation

## 2018-02-05 DIAGNOSIS — H9190 Unspecified hearing loss, unspecified ear: Secondary | ICD-10-CM | POA: Insufficient documentation

## 2018-02-05 DIAGNOSIS — K219 Gastro-esophageal reflux disease without esophagitis: Secondary | ICD-10-CM | POA: Diagnosis not present

## 2018-02-05 DIAGNOSIS — J449 Chronic obstructive pulmonary disease, unspecified: Secondary | ICD-10-CM | POA: Insufficient documentation

## 2018-02-05 DIAGNOSIS — I110 Hypertensive heart disease with heart failure: Secondary | ICD-10-CM | POA: Insufficient documentation

## 2018-02-05 DIAGNOSIS — R161 Splenomegaly, not elsewhere classified: Secondary | ICD-10-CM | POA: Diagnosis not present

## 2018-02-05 DIAGNOSIS — Z9012 Acquired absence of left breast and nipple: Secondary | ICD-10-CM | POA: Diagnosis not present

## 2018-02-05 DIAGNOSIS — Z79899 Other long term (current) drug therapy: Secondary | ICD-10-CM | POA: Diagnosis not present

## 2018-02-05 DIAGNOSIS — R918 Other nonspecific abnormal finding of lung field: Secondary | ICD-10-CM | POA: Insufficient documentation

## 2018-02-05 DIAGNOSIS — Z79890 Hormone replacement therapy (postmenopausal): Secondary | ICD-10-CM | POA: Insufficient documentation

## 2018-02-05 DIAGNOSIS — Z96653 Presence of artificial knee joint, bilateral: Secondary | ICD-10-CM | POA: Insufficient documentation

## 2018-02-05 DIAGNOSIS — I5032 Chronic diastolic (congestive) heart failure: Secondary | ICD-10-CM | POA: Diagnosis not present

## 2018-02-05 DIAGNOSIS — R251 Tremor, unspecified: Secondary | ICD-10-CM | POA: Insufficient documentation

## 2018-02-05 DIAGNOSIS — R590 Localized enlarged lymph nodes: Secondary | ICD-10-CM | POA: Diagnosis not present

## 2018-02-05 DIAGNOSIS — E782 Mixed hyperlipidemia: Secondary | ICD-10-CM | POA: Diagnosis not present

## 2018-02-05 DIAGNOSIS — E890 Postprocedural hypothyroidism: Secondary | ICD-10-CM | POA: Diagnosis not present

## 2018-02-05 DIAGNOSIS — Z8672 Personal history of thrombophlebitis: Secondary | ICD-10-CM | POA: Diagnosis not present

## 2018-02-05 DIAGNOSIS — Z87891 Personal history of nicotine dependence: Secondary | ICD-10-CM | POA: Insufficient documentation

## 2018-02-05 LAB — PROTIME-INR
INR: 1.17
PROTHROMBIN TIME: 14.7 s (ref 11.4–15.2)

## 2018-02-05 LAB — CBC
HCT: 37.8 % (ref 36.0–46.0)
HEMOGLOBIN: 11.3 g/dL — AB (ref 12.0–15.0)
MCH: 29 pg (ref 26.0–34.0)
MCHC: 29.9 g/dL — ABNORMAL LOW (ref 30.0–36.0)
MCV: 96.9 fL (ref 80.0–100.0)
Platelets: 291 10*3/uL (ref 150–400)
RBC: 3.9 MIL/uL (ref 3.87–5.11)
RDW: 16.3 % — AB (ref 11.5–15.5)
WBC: 4.1 10*3/uL (ref 4.0–10.5)
nRBC: 0 % (ref 0.0–0.2)

## 2018-02-05 LAB — APTT: aPTT: 38 seconds — ABNORMAL HIGH (ref 24–36)

## 2018-02-05 MED ORDER — FENTANYL CITRATE (PF) 100 MCG/2ML IJ SOLN
INTRAMUSCULAR | Status: AC | PRN
  Administered 2018-02-05 (×2): 25 ug via INTRAVENOUS

## 2018-02-05 MED ORDER — FENTANYL CITRATE (PF) 100 MCG/2ML IJ SOLN
INTRAMUSCULAR | Status: AC
  Filled 2018-02-05: qty 2

## 2018-02-05 MED ORDER — MIDAZOLAM HCL 2 MG/2ML IJ SOLN
INTRAMUSCULAR | Status: AC | PRN
  Administered 2018-02-05 (×2): 0.5 mg via INTRAVENOUS

## 2018-02-05 MED ORDER — MIDAZOLAM HCL 2 MG/2ML IJ SOLN
INTRAMUSCULAR | Status: AC
  Filled 2018-02-05: qty 2

## 2018-02-05 MED ORDER — FLUMAZENIL 0.5 MG/5ML IV SOLN
INTRAVENOUS | Status: AC
  Filled 2018-02-05: qty 5

## 2018-02-05 MED ORDER — NALOXONE HCL 0.4 MG/ML IJ SOLN
INTRAMUSCULAR | Status: AC
  Filled 2018-02-05: qty 1

## 2018-02-05 MED ORDER — SODIUM CHLORIDE 0.9 % IV SOLN
INTRAVENOUS | Status: DC
  Administered 2018-02-05: 09:00:00 via INTRAVENOUS

## 2018-02-05 NOTE — Discharge Instructions (Addendum)
Moderate Conscious Sedation, Adult, Care After  These instructions provide you with information about caring for yourself after your procedure. Your health care provider may also give you more specific instructions. Your treatment has been planned according to current medical practices, but problems sometimes occur. Call your health care provider if you have any problems or questions after your procedure.  What can I expect after the procedure?  After your procedure, it is common:  · To feel sleepy for several hours.  · To feel clumsy and have poor balance for several hours.  · To have poor judgment for several hours.  · To vomit if you eat too soon.    Follow these instructions at home:  For at least 24 hours after the procedure:    · Do not:  ? Participate in activities where you could fall or become injured.  ? Drive.  ? Use heavy machinery.  ? Drink alcohol.  ? Take sleeping pills or medicines that cause drowsiness.  ? Make important decisions or sign legal documents.  ? Take care of children on your own.  · Rest.  Eating and drinking  · Follow the diet recommended by your health care provider.  · If you vomit:  ? Drink water, juice, or soup when you can drink without vomiting.  ? Make sure you have little or no nausea before eating solid foods.  General instructions  · Have a responsible adult stay with you until you are awake and alert.  · Take over-the-counter and prescription medicines only as told by your health care provider.  · If you smoke, do not smoke without supervision.  · Keep all follow-up visits as told by your health care provider. This is important.  Contact a health care provider if:  · You keep feeling nauseous or you keep vomiting.  · You feel light-headed.  · You develop a rash.  · You have a fever.  Get help right away if:  · You have trouble breathing.  This information is not intended to replace advice given to you by your health care provider. Make sure you discuss any questions you have  with your health care provider.  Document Released: 01/05/2013 Document Revised: 08/20/2015 Document Reviewed: 07/07/2015  Elsevier Interactive Patient Education © 2018 Elsevier Inc[employer]  Needle Biopsy, Care After  Refer to this sheet in the next few weeks. These instructions provide you with information about caring for yourself after your procedure. Your health care provider may also give you more specific instructions. Your treatment has been planned according to current medical practices, but problems sometimes occur. Call your health care provider if you have any problems or questions after your procedure.  What can I expect after the procedure?  After your procedure, it is common to have soreness, bruising, or mild pain at the biopsy site. This should go away in a few days.  Follow these instructions at home:  · Rest as directed by your health care provider.  · Take medicines only as directed by your health care provider.  · There are many different ways to close and cover the biopsy site, including stitches (sutures), skin glue, and adhesive strips. Follow your health care provider's instructions about:  ? Biopsy site care.  ? Bandage (dressing) changes and removal.  ? Biopsy site closure removal.  · Check your biopsy site every day for signs of infection. Watch for:  ? Redness, swelling, or pain.  ? Fluid, blood, or pus.  Contact a health   care provider if:  · You have a fever.  · You have redness, swelling, or pain at the biopsy site that lasts longer than a few days.  · You have fluid, blood, or pus coming from the biopsy site.  · You feel nauseous.  · You vomit.  Get help right away if:  · You have shortness of breath.  · You have trouble breathing.  · You have chest pain.  · You feel dizzy or you faint.  · You have bleeding that does not stop with pressure or a bandage.  · You cough up blood.  · You have pain in your abdomen.  This information is not intended to replace advice given to you by your health care  provider. Make sure you discuss any questions you have with your health care provider.  Document Released: 08/01/2014 Document Revised: 08/23/2015 Document Reviewed: 03/13/2014  Elsevier Interactive Patient Education © 2018 Elsevier Inc.

## 2018-02-05 NOTE — Consult Note (Signed)
Chief Complaint: Patient was seen in consultation today for CT-guided left retroperitoneal lymph node biopsy  Referring Physician(s): Mohamed,Mohamed  Supervising Physician: Corrie Mckusick  Patient Status: [facility] - Out-pt  History of Present Illness: Kristin Hood is a 77 y.o. female with prior history of left breast cancer in 2001 and now with newly diagnosed non Hodgkin's lymphoma.  Recent CT scan chest abdomen pelvis on 01/20/2018 revealed:  Slight interval increase in size of retroperitoneal adenopathy. Mild interval increase in size of right subpectoral lymph node.  Interval increase in size of splenomegaly.  Interval development of small amount of fluid within the retroperitoneum and pelvis, nonspecific in etiology.  Scattered ground-glass opacities within the lungs which may represent chronic lung disease such as hypersensitivity pneumonitis  She presents today for CT-guided left retroperitoneal lymph node biopsy for further  evaluation.  Past Medical History:  Diagnosis Date  . Acute congestive heart failure ([place])   . Acute diastolic CHF (congestive heart failure) ([place]) 12/30/2016  . Acute respiratory failure with hypoxemia ([place]) 06/09/2016  . Acute respiratory failure with hypoxia ([place]) 12/30/2016  . Anginal pain ([place])   . ARF (acute renal failure) ([place]) 06/11/2016  . Arthritis   . Atrial fibrillation ([place]) 07/2012   Eliquis, rate controlled  . Benign hypertensive heart disease without heart failure 12/25/2012  . Breast cancer ([place]) 2001   left mastectomy  . Chest pain 12/11/2012  . CHF (congestive heart failure) ([place]) 06/10/2016  . Chronic atrial fibrillation 11/16/2014  . Chronic diastolic heart failure ([place]) 02/13/2014  . Coarse tremors    , Essential  . Complication of anesthesia    SMALLER TUBE FOR INTUBATION AS GAGS ON TUBE  . COPD (chronic obstructive pulmonary disease) ([place])   . COPD exacerbation ([place]) 12/30/2016  . CVA (cerebral vascular accident)  ([place]) 2013  . Dysrhythmia 08/20/2012   Atrial Fibrillation  . Essential and other specified forms of tremor 06/18/2012  . Essential hypertension 06/18/2012  . Family history of adverse reaction to anesthesia    mother experienced pain with intubation   . GERD (gastroesophageal reflux disease)    history of   . Hard of hearing   . Heart murmur   . History of chemotherapy 11/13/1998 to 2010   Adriamycin/Docotaxol, Dexorubicin/Taxotere, Femara, Tamoxifen  . History of CVA (cerebrovascular accident) 12/11/2012  . History of hiatal hernia    history of   . Hx of adenomatous colonic polyps 2006   , Benign  . Hypercholesteremia   . Hyperlipidemia 02/13/2014  . Hypertension    , With mild concentric left ventricular hypertrophy  . Long term current use of anticoagulant therapy 12/25/2012  . Mixed dyslipidemia   . Mixed hyperlipidemia 12/25/2012  . Morbid obesity ([place]) 06/18/2012  . Phlebitis and thrombophlebitis of superficial vessels of lower extremities 12/25/2012  . 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 Pure hypercholesterolemia 12/25/2012  . Shingles   . Shortness of breath dyspnea    on exertion   . SOB (shortness of breath) 11/16/2014  . Spider veins   . Stroke ([place]) 11/17/2011   left side brain-speech-TPA  . Thyroid nodule   . Tremor, essential   . Unspecified cerebral artery occlusion with cerebral infarction 11/18/2011  . Valvular sclerosis 09/23/2003   without stenosis  . Varicose veins     Past Surgical History:  Procedure Laterality Date  . APPENDECTOMY  07/10/1957  . BIOPSY THYROID  12/01/2013   ultrasound  . BREAST SURGERY    . BUNIONECTOMY  11/29/1988  bilateral with hammer toes  . CARDIAC CATHETERIZATION  07/2010   , Without significant CAD  . CARDIAC CATHETERIZATION  07/2011   [name]  . CESAREAN SECTION    . CESAREAN SECTION  05/1968, 07/1965  . EYE SURGERY  01/31/2013   eye lid; cataract surgery bilat   . JOINT REPLACEMENT  05/2001   left knee  .  JOINT REPLACEMENT  11/2001   right knee  . JOINT REPLACEMENT  01/2008   redo right knee  . KNEE SURGERY  2009   ,Total knee replacement  . LEFT HEART CATHETERIZATION WITH CORONARY ANGIOGRAM N/A 08/14/2011   Procedure: LEFT HEART CATHETERIZATION WITH CORONARY ANGIOGRAM;  Surgeon: [name], MD;  Location: [facility] CATH LAB;  Service: Cardiovascular;  Laterality: N/A;  . Left Rotator Cuff    . MASTECTOMY  10/19/1998   Radical, left -with lymph nodes (8 total)  . Reverse bunionectomy  08/29/1993   removal of Tibial Sesamoid-left  . TEE WITHOUT CARDIOVERSION  11/19/2011   Procedure: TRANSESOPHAGEAL ECHOCARDIOGRAM (TEE);  Surgeon: [name], MD;  Location: [facility] ENDOSCOPY;  Service: Cardiovascular;  Laterality: N/A;  . THYROIDECTOMY N/A 03/02/2015   Procedure: TOTAL THYROIDECTOMY;  Surgeon: Armandina Gemma, MD;  Location: [facility] ORS;  Service: General;  Laterality: N/A;  . TONSILLECTOMY    . TOTAL HIP ARTHROPLASTY Left 10/11/2014   Procedure: LEFT TOTAL HIP ARTHROPLASTY ANTERIOR APPROACH;  Surgeon: [name], MD;  Location: [facility] ORS;  Service: Orthopedics;  Laterality: Left;    Allergies: Floxin [ofloxacin]  Medications: Prior to Admission medications   Medication Sig Start Date End Date Taking? Authorizing Provider  amLODipine (NORVASC) 5 MG tablet Take 5 mg by mouth daily.   Yes [provider]  atorvastatin (LIPITOR) 20 MG tablet Take 10 mg by mouth daily after supper.    Yes [provider]  fenofibrate 160 MG tablet Take 160 mg by mouth daily after supper.    Yes [provider]  ferrous sulfate 325 (65 FE) MG EC tablet Take 325 mg by mouth daily after supper.    Yes [provider]  furosemide (LASIX) 20 MG tablet Take 1 tablet (20 mg total) by mouth daily. May take extra tablet daily as needed for weight gain 01/06/17 02/05/18 Yes Isaiah Serge, NP  ketoconazole (NIZORAL) 2 % cream pt uses 1 application topically daily as needed for rash 01/24/15  Yes [provider]  levothyroxine (SYNTHROID, LEVOTHROID) 175 MCG tablet TAKE 1 TABLET BY MOUTH DAILY BEFORE BREAKFAST. 10/27/17  Yes Philemon Kingdom, MD  loratadine (CLARITIN) 10 MG tablet Take 10 mg by mouth daily.   Yes [provider]  losartan (COZAAR) 100 MG tablet Take 100 mg by mouth daily.   Yes [provider]  Multiple Vitamin (MULTIVITAMIN WITH MINERALS) TABS tablet Take 1 tablet by mouth daily.   Yes [provider]  OMEPRAZOLE PO Take 20 mg by mouth daily.   Yes [provider]  potassium chloride (K-DUR) 10 MEQ tablet Take 1 tablet (10 mEq total) by mouth daily. Take extra tablet by mouth when taking extra furosemide 01/06/17 02/05/18 Yes Isaiah Serge, NP  propranolol ER (INDERAL LA) 60 MG 24 hr capsule TAKE 2 CAPS IN THE MORNING TAKE 1 CAP IN THE AFTERNOON, 1 CAP AT NIGHT 10/06/17  Yes Isaiah Serge, NP  Pseudoephedrine-Guaifenesin [facility] D PO) Take 1 capsule by mouth daily.    Yes [provider]  Tiotropium Bromide Monohydrate (SPIRIVA RESPIMAT) 2.5 MCG/ACT AERS Inhale 2  puffs into the lungs daily. 08/10/17  Yes Collene Gobble, MD  albuterol (PROVENTIL HFA;VENTOLIN HFA) 108 (90 Base) MCG/ACT inhaler Inhale 2 puffs into the lungs every 4 (four) hours as needed for wheezing or shortness of breath. Patient not taking: Reported on 01/25/2018 01/09/16   Collene Gobble, MD  ELIQUIS 5 MG TABS tablet TAKE 1 TABLET BY MOUTH TWICE DAILY 12/14/17   Jerline Pain, MD  fluticasone (FLONASE) 50 MCG/ACT nasal spray Place 2 sprays into both nostrils as needed for allergies or rhinitis.    [provider]     Family History  Problem Relation Age of Onset  . Heart disease Mother   . Aortic stenosis Mother   . Heart disease Father   . Heart failure Father   . Heart attack Father     Social History   Socioeconomic History  . Marital status: Widowed    Spouse name: Not on file  . Number of children: Not on file  . Years of education:  Not on file  . Highest education level: Not on file  Occupational History  . Occupation: retired  Scientific laboratory technician  . Financial resource strain: Not on file  . Food insecurity:    Worry: Not on file    Inability: Not on file  . Transportation needs:    Medical: Not on file    Non-medical: Not on file  Tobacco Use  . Smoking status: Former Smoker    Packs/day: 2.00    Years: 30.00    Pack years: 60.00    Types: Cigarettes    Last attempt to quit: 01/30/1992    Years since quitting: 26.0  . Smokeless tobacco: Never Used  Substance and Sexual Activity  . Alcohol use: Yes    Alcohol/week: 1.0 standard drinks    Types: 1 Standard drinks or equivalent per week    Comment: wine occassionally  . Drug use: No  . Sexual activity: Not on file  Lifestyle  . Physical activity:    Days per week: Not on file    Minutes per session: Not on file  . Stress: Not on file  Relationships  . Social connections:    Talks on phone: Not on file    Gets together: Not on file    Attends religious service: Not on file    Active member of club or organization: Not on file    Attends meetings of clubs or organizations: Not on file    Relationship status: Not on file  Other Topics Concern  . Not on file  Social History Narrative  . Not on file      Review of Systems denies fever, headache, chest pain, cough, nausea, vomiting or bleeding.  She does have some dyspnea with exertion, occasional right upper quadrant abd discomfort and low back pain.  Vital Signs: BP 134/75   Pulse 80   Temp 97.8 F (36.6 C) (Oral)   Resp 16   SpO2 97%   Physical Exam awake, alert.  Chest with few fine basilar crackles on right, left clear.  Heart with normal rate, occasional ectopy (hx afib); abdomen obese, soft, positive bowel sounds, nontender.  Trace pretibial edema bilaterally.  Imaging: Ct Chest W Contrast  Result Date: 01/21/2018 CLINICAL DATA:  Patient with history of lymphoma. History of left breast  cancer. Re-evaluation. EXAM: CT CHEST, ABDOMEN, AND PELVIS WITH CONTRAST TECHNIQUE: Multidetector CT imaging of the chest, abdomen and pelvis was performed following the standard protocol during bolus  administration of intravenous contrast. CONTRAST:  28mL OMNIPAQUE IOHEXOL 300 MG/ML  SOLN COMPARISON:  CT CAP 07/14/2017 FINDINGS: CT CHEST FINDINGS Cardiovascular: Heart is enlarged. Coronary arterial vascular calcifications. Thoracic aortic vascular calcifications. Mediastinum/Nodes: Unchanged 9 mm lymph node at the thoracic inlet (image 4; series 2). Unchanged 1.2 cm paratracheal lymph node (image 19; series 2). Similar-appearing right paraesophageal lymph node measuring up to 3 mm in thickness (image 42; series 2). Unchanged 0.6 cm prevascular lymph node (image 18; series 2). Increased right subpectoral lymph node measuring 9 mm (image 58; series 5), previously 6 mm. Lungs/Pleura: Central airways are patent. Similar-appearing patchy ground-glass pulmonary opacities. Unchanged 3 mm nodule in the lingula (image 64; series 4). Tiny right pleural effusion. No pneumothorax. Musculoskeletal: Thoracic spine degenerative changes. No aggressive or acute appearing osseous lesions. Prior left mastectomy. CT ABDOMEN PELVIS FINDINGS Hepatobiliary: Mild nodularity of the hepatic contour. No focal hepatic lesion identified. Cholelithiasis. No intrahepatic or extrahepatic biliary ductal dilatation. Pancreas: Unremarkable Spleen: Increased in size measuring 17.7 cm (image 60; series 2). Adrenals/Urinary Tract: Normal adrenal glands. Kidneys enhance symmetrically with contrast. No hydronephrosis. Urinary bladder is unremarkable. Stomach/Bowel: Oral contrast material to the rectum. Sigmoid colonic diverticulosis. No CT evidence for acute diverticulitis. No evidence for small bowel obstruction. No free intraperitoneal air. Small hiatal hernia. Normal morphology of the stomach. Vascular/Lymphatic: Normal caliber abdominal aorta.  Peripheral calcified atherosclerotic plaque. Interval increase in left periaortic adenopathy measuring up to 3.4 cm (image 73; series 2), previously 2.4 cm. Similar-appearing 1.6 cm porta hepatic lymph node (image 66; series 2). Interval increase in thickness of aortocaval adenopathy measuring 2.5 cm (image 75; series 2), previously 2.0 cm. Reproductive: Calcified fibroids. Other: Interval development of small amount of fluid within the abdomen and pelvis. Musculoskeletal: Left hip arthroplasty. Lumbar spine degenerative changes. No aggressive or acute appearing osseous lesions. IMPRESSION: Slight interval increase in size of retroperitoneal adenopathy. Mild interval increase in size of right subpectoral lymph node. Interval increase in size of splenomegaly. Interval development of small amount of fluid within the retroperitoneum and pelvis, nonspecific in etiology. Scattered ground-glass opacities within the lungs which may represent chronic lung disease such as hypersensitivity pneumonitis. Electronically Signed   By: Lovey Newcomer M.D.   On: 01/21/2018 09:12   Ct Abdomen Pelvis W Contrast  Result Date: 01/21/2018 CLINICAL DATA:  Patient with history of lymphoma. History of left breast cancer. Re-evaluation. EXAM: CT CHEST, ABDOMEN, AND PELVIS WITH CONTRAST TECHNIQUE: Multidetector CT imaging of the chest, abdomen and pelvis was performed following the standard protocol during bolus administration of intravenous contrast. CONTRAST:  53mL OMNIPAQUE IOHEXOL 300 MG/ML  SOLN COMPARISON:  CT CAP 07/14/2017 FINDINGS: CT CHEST FINDINGS Cardiovascular: Heart is enlarged. Coronary arterial vascular calcifications. Thoracic aortic vascular calcifications. Mediastinum/Nodes: Unchanged 9 mm lymph node at the thoracic inlet (image 4; series 2). Unchanged 1.2 cm paratracheal lymph node (image 19; series 2). Similar-appearing right paraesophageal lymph node measuring up to 3 mm in thickness (image 42; series 2). Unchanged 0.6  cm prevascular lymph node (image 18; series 2). Increased right subpectoral lymph node measuring 9 mm (image 58; series 5), previously 6 mm. Lungs/Pleura: Central airways are patent. Similar-appearing patchy ground-glass pulmonary opacities. Unchanged 3 mm nodule in the lingula (image 64; series 4). Tiny right pleural effusion. No pneumothorax. Musculoskeletal: Thoracic spine degenerative changes. No aggressive or acute appearing osseous lesions. Prior left mastectomy. CT ABDOMEN PELVIS FINDINGS Hepatobiliary: Mild nodularity of the hepatic contour. No focal hepatic lesion identified. Cholelithiasis. No intrahepatic or extrahepatic biliary  ductal dilatation. Pancreas: Unremarkable Spleen: Increased in size measuring 17.7 cm (image 60; series 2). Adrenals/Urinary Tract: Normal adrenal glands. Kidneys enhance symmetrically with contrast. No hydronephrosis. Urinary bladder is unremarkable. Stomach/Bowel: Oral contrast material to the rectum. Sigmoid colonic diverticulosis. No CT evidence for acute diverticulitis. No evidence for small bowel obstruction. No free intraperitoneal air. Small hiatal hernia. Normal morphology of the stomach. Vascular/Lymphatic: Normal caliber abdominal aorta. Peripheral calcified atherosclerotic plaque. Interval increase in left periaortic adenopathy measuring up to 3.4 cm (image 73; series 2), previously 2.4 cm. Similar-appearing 1.6 cm porta hepatic lymph node (image 66; series 2). Interval increase in thickness of aortocaval adenopathy measuring 2.5 cm (image 75; series 2), previously 2.0 cm. Reproductive: Calcified fibroids. Other: Interval development of small amount of fluid within the abdomen and pelvis. Musculoskeletal: Left hip arthroplasty. Lumbar spine degenerative changes. No aggressive or acute appearing osseous lesions. IMPRESSION: Slight interval increase in size of retroperitoneal adenopathy. Mild interval increase in size of right subpectoral lymph node. Interval increase in  size of splenomegaly. Interval development of small amount of fluid within the retroperitoneum and pelvis, nonspecific in etiology. Scattered ground-glass opacities within the lungs which may represent chronic lung disease such as hypersensitivity pneumonitis. Electronically Signed   By: Lovey Newcomer M.D.   On: 01/21/2018 09:12    Labs:  CBC: Recent Labs    05/21/17 1027 01/20/18 1001 01/28/18 1023 02/05/18 0909  WBC 4.2 3.0* 4.2 4.1  HGB 11.3 9.8* 10.4* 11.3*  HCT 35.0 31.8* 34.4* 37.8  PLT 325 244 287 291    COAGS: Recent Labs    02/05/18 0909  INR 1.17  APTT 38*    BMP: Recent Labs    05/21/17 1027 07/17/17 0830 01/20/18 1001  NA 142 138 140  K 4.9 4.6 4.1  CL 104 104 107  CO2 22 28 23   GLUCOSE 104* 93 115*  BUN 33* 32* 29*  CALCIUM 9.6 9.2 9.1  CREATININE 1.26* 1.08 1.41*  GFRNONAA 41* 48* 35*  GFRAA 47* 56* 40*    LIVER FUNCTION TESTS: Recent Labs    07/17/17 0830 01/20/18 1001  BILITOT 0.7 1.0  AST 16 21  ALT 15 10  ALKPHOS 27* 26*  PROT 7.3 7.7  ALBUMIN 3.3* 3.4*    TUMOR MARKERS: No results for input(s): AFPTM, CEA, CA199, CHROMGRNA in the last 8760 hours.  Assessment and Plan:  77 y.o. female with prior history of left breast cancer in 2001 and now with newly diagnosed non Hodgkin's lymphoma.  Recent CT scan chest abdomen pelvis on 01/20/2018 revealed:  Slight interval increase in size of retroperitoneal adenopathy. Mild interval increase in size of right subpectoral lymph node.  Interval increase in size of splenomegaly.  Interval development of small amount of fluid within the retroperitoneum and pelvis, nonspecific in etiology.  Scattered ground-glass opacities within the lungs which may represent chronic lung disease such as hypersensitivity pneumonitis  She presents today for CT-guided left retroperitoneal lymph node biopsy for further  Evaluation.Risks and benefits discussed with the patient/family including, but not limited  to bleeding, infection, damage to adjacent structures or low yield requiring additional tests.  All of the patient's questions were answered, patient is agreeable to proceed. Consent signed and in chart.      Thank you for this interesting consult.  I greatly enjoyed meeting Kristin Hood and look forward to participating in their care.  A copy of this report was sent to the requesting provider on this date.  Electronically Signed:  D. Rowe Robert, PA-C 02/05/2018, 9:52 AM   I spent a total of 25 minutes    in face to face in clinical consultation, greater than 50% of which was counseling/coordinating care for CT guided left retroperitoneal lymph node biopsy

## 2018-02-05 NOTE — Procedures (Signed)
Interventional Radiology Procedure Note  Procedure: CT guided biopsy of left retroperitoneal lymph node. .  Complications: None Recommendations:  - 1 hour recover - advance diet - Do not submerge for 7 days - Routine care   Signed,  Dulcy Fanny. Earleen Newport, DO

## 2018-02-05 NOTE — Progress Notes (Signed)
Lennette Bihari, [place] notified patient that she could start back on eliquis tomorrow morning.

## 2018-02-08 ENCOUNTER — Encounter (HOSPITAL_COMMUNITY): Payer: Self-pay | Admitting: Internal Medicine

## 2018-02-09 ENCOUNTER — Ambulatory Visit (HOSPITAL_COMMUNITY): Payer: PPO

## 2018-02-15 ENCOUNTER — Other Ambulatory Visit: Payer: Self-pay | Admitting: Internal Medicine

## 2018-02-17 DIAGNOSIS — H6121 Impacted cerumen, right ear: Secondary | ICD-10-CM | POA: Diagnosis not present

## 2018-02-17 DIAGNOSIS — I10 Essential (primary) hypertension: Secondary | ICD-10-CM | POA: Diagnosis not present

## 2018-02-17 DIAGNOSIS — E782 Mixed hyperlipidemia: Secondary | ICD-10-CM | POA: Diagnosis not present

## 2018-02-17 DIAGNOSIS — Z1389 Encounter for screening for other disorder: Secondary | ICD-10-CM | POA: Diagnosis not present

## 2018-02-17 DIAGNOSIS — R590 Localized enlarged lymph nodes: Secondary | ICD-10-CM | POA: Diagnosis not present

## 2018-02-17 DIAGNOSIS — Z853 Personal history of malignant neoplasm of breast: Secondary | ICD-10-CM | POA: Diagnosis not present

## 2018-02-17 DIAGNOSIS — Z0000 Encounter for general adult medical examination without abnormal findings: Secondary | ICD-10-CM | POA: Diagnosis not present

## 2018-02-17 DIAGNOSIS — I5032 Chronic diastolic (congestive) heart failure: Secondary | ICD-10-CM | POA: Diagnosis not present

## 2018-02-17 DIAGNOSIS — E890 Postprocedural hypothyroidism: Secondary | ICD-10-CM | POA: Diagnosis not present

## 2018-02-17 DIAGNOSIS — I4891 Unspecified atrial fibrillation: Secondary | ICD-10-CM | POA: Diagnosis not present

## 2018-02-19 DIAGNOSIS — I10 Essential (primary) hypertension: Secondary | ICD-10-CM | POA: Diagnosis not present

## 2018-02-19 DIAGNOSIS — I4891 Unspecified atrial fibrillation: Secondary | ICD-10-CM | POA: Diagnosis not present

## 2018-02-19 DIAGNOSIS — E890 Postprocedural hypothyroidism: Secondary | ICD-10-CM | POA: Diagnosis not present

## 2018-02-19 DIAGNOSIS — J449 Chronic obstructive pulmonary disease, unspecified: Secondary | ICD-10-CM | POA: Diagnosis not present

## 2018-02-19 DIAGNOSIS — Z0000 Encounter for general adult medical examination without abnormal findings: Secondary | ICD-10-CM | POA: Diagnosis not present

## 2018-02-19 DIAGNOSIS — R7301 Impaired fasting glucose: Secondary | ICD-10-CM | POA: Diagnosis not present

## 2018-02-19 DIAGNOSIS — E782 Mixed hyperlipidemia: Secondary | ICD-10-CM | POA: Diagnosis not present

## 2018-02-19 DIAGNOSIS — Z853 Personal history of malignant neoplasm of breast: Secondary | ICD-10-CM | POA: Diagnosis not present

## 2018-02-19 DIAGNOSIS — R590 Localized enlarged lymph nodes: Secondary | ICD-10-CM | POA: Diagnosis not present

## 2018-02-19 DIAGNOSIS — I5032 Chronic diastolic (congestive) heart failure: Secondary | ICD-10-CM | POA: Diagnosis not present

## 2018-02-19 DIAGNOSIS — Z8673 Personal history of transient ischemic attack (TIA), and cerebral infarction without residual deficits: Secondary | ICD-10-CM | POA: Diagnosis not present

## 2018-02-22 ENCOUNTER — Other Ambulatory Visit: Payer: Self-pay

## 2018-02-22 ENCOUNTER — Other Ambulatory Visit: Payer: Self-pay | Admitting: *Deleted

## 2018-02-22 DIAGNOSIS — H04123 Dry eye syndrome of bilateral lacrimal glands: Secondary | ICD-10-CM | POA: Diagnosis not present

## 2018-02-22 DIAGNOSIS — Z961 Presence of intraocular lens: Secondary | ICD-10-CM | POA: Diagnosis not present

## 2018-02-22 DIAGNOSIS — H524 Presbyopia: Secondary | ICD-10-CM | POA: Diagnosis not present

## 2018-02-22 MED ORDER — FUROSEMIDE 20 MG PO TABS: 20.0000 mg | ORAL_TABLET | Freq: Every day | ORAL | 6 refills | Status: DC

## 2018-02-23 ENCOUNTER — Inpatient Hospital Stay: Payer: PPO | Attending: Internal Medicine | Admitting: Internal Medicine

## 2018-02-23 ENCOUNTER — Encounter: Payer: Self-pay | Admitting: Internal Medicine

## 2018-02-23 ENCOUNTER — Telehealth: Payer: Self-pay

## 2018-02-23 VITALS — BP 123/65 | HR 80 | Temp 97.7°F | Resp 19 | Ht 66.0 in | Wt 230.2 lb

## 2018-02-23 DIAGNOSIS — I10 Essential (primary) hypertension: Secondary | ICD-10-CM | POA: Diagnosis not present

## 2018-02-23 DIAGNOSIS — Z79899 Other long term (current) drug therapy: Secondary | ICD-10-CM | POA: Diagnosis not present

## 2018-02-23 DIAGNOSIS — I482 Chronic atrial fibrillation, unspecified: Secondary | ICD-10-CM | POA: Diagnosis not present

## 2018-02-23 DIAGNOSIS — Z9012 Acquired absence of left breast and nipple: Secondary | ICD-10-CM | POA: Diagnosis not present

## 2018-02-23 DIAGNOSIS — Z7901 Long term (current) use of anticoagulants: Secondary | ICD-10-CM | POA: Diagnosis not present

## 2018-02-23 DIAGNOSIS — Z8673 Personal history of transient ischemic attack (TIA), and cerebral infarction without residual deficits: Secondary | ICD-10-CM | POA: Diagnosis not present

## 2018-02-23 DIAGNOSIS — C8510 Unspecified B-cell lymphoma, unspecified site: Secondary | ICD-10-CM

## 2018-02-23 DIAGNOSIS — Z853 Personal history of malignant neoplasm of breast: Secondary | ICD-10-CM | POA: Diagnosis not present

## 2018-02-23 DIAGNOSIS — C8580 Other specified types of non-Hodgkin lymphoma, unspecified site: Secondary | ICD-10-CM | POA: Insufficient documentation

## 2018-02-23 NOTE — Progress Notes (Signed)
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  PRIOR THERAPY: None.  CURRENT THERAPY: Observation.  INTERVAL HISTORY: Kristin Hood 77 y.o. female returns to the clinic today for follow-up visit accompanied by her daughter and son.  The patient is feeling fine today with no concerning complaints except for generalized fatigue.  She has some bruises in her upper extremities from the IV access during the recent procedures.  She had CT-guided core biopsy of retroperitoneal soft tissue as well as bone marrow biopsy and aspirate performed recently.  She is here today for evaluation and discussion of the biopsy results and treatment options.  She has no nausea, vomiting, diarrhea or constipation.  She has no headache or visual changes.  She denied having any fever or chills.  She has no chest pain but has shortness of breath with exertion with no cough or hemoptysis.   MEDICAL HISTORY: Past Medical History:  Diagnosis Date  . Acute congestive heart failure ([place])   . Acute diastolic CHF (congestive heart failure) ([place]) 12/30/2016  . Acute respiratory failure with hypoxemia ([place]) 06/09/2016  . Acute respiratory failure with hypoxia ([place]) 12/30/2016  . Anginal pain ([place])   . ARF (acute renal failure) ([place]) 06/11/2016  . Arthritis   . Atrial fibrillation ([place]) 07/2012   Eliquis, rate controlled  . Benign hypertensive heart disease without heart failure 12/25/2012  . Breast cancer ([place]) 2001   left mastectomy  . Chest pain  12/11/2012  . CHF (congestive heart failure) ([place]) 06/10/2016  . Chronic atrial fibrillation 11/16/2014  . Chronic diastolic heart failure ([place]) 02/13/2014  . Coarse tremors    , Essential  . Complication of anesthesia    SMALLER TUBE FOR INTUBATION AS GAGS ON TUBE  . COPD (chronic obstructive pulmonary disease) ([place])   . COPD exacerbation ([place]) 12/30/2016  . CVA (cerebral vascular accident) ([place]) 2013  . Dysrhythmia 08/20/2012   Atrial Fibrillation  . Essential and other specified forms of tremor 06/18/2012  . Essential hypertension 06/18/2012  . Family history of adverse reaction to anesthesia    mother experienced pain with intubation   . GERD (gastroesophageal reflux disease)    history of   . Hard of hearing   . Heart murmur   . History of chemotherapy 11/13/1998 to 2010   Adriamycin/Docotaxol, Dexorubicin/Taxotere, Femara, Tamoxifen  . History of CVA (cerebrovascular accident) 12/11/2012  . History of hiatal hernia    history of   . Hx of adenomatous colonic polyps 2006   , Benign  . Hypercholesteremia   . Hyperlipidemia 02/13/2014  . Hypertension    , With mild concentric left ventricular hypertrophy  . Long term current use of anticoagulant therapy 12/25/2012  . Mixed dyslipidemia   . Mixed hyperlipidemia 12/25/2012  . Morbid obesity ([place]) 06/18/2012  . Phlebitis and thrombophlebitis of superficial vessels of lower extremities 12/25/2012  . 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 Pure hypercholesterolemia 12/25/2012  . Shingles   . Shortness of breath dyspnea    on exertion   . SOB (shortness of breath) 11/16/2014  . Spider veins   .  Stroke ([place]) 11/17/2011   left side brain-speech-TPA  . Thyroid nodule   . Tremor, essential   . Unspecified cerebral artery occlusion with cerebral infarction 11/18/2011  . Valvular sclerosis 09/23/2003   without stenosis  . Varicose veins     ALLERGIES:  is allergic to floxin [ofloxacin].  MEDICATIONS:  Current Outpatient Medications    Medication Sig Dispense Refill  . albuterol (PROVENTIL HFA;VENTOLIN HFA) 108 (90 Base) MCG/ACT inhaler Inhale 2 puffs into the lungs every 4 (four) hours as needed for wheezing or shortness of breath. (Patient not taking: Reported on 01/25/2018) 1 Inhaler 5  . amLODipine (NORVASC) 5 MG tablet Take 5 mg by mouth daily.    Marland Kitchen atorvastatin (LIPITOR) 20 MG tablet Take 10 mg by mouth daily after supper.     Marland Kitchen ELIQUIS 5 MG TABS tablet TAKE 1 TABLET BY MOUTH TWICE DAILY 180 tablet 2  . fenofibrate 160 MG tablet Take 160 mg by mouth daily after supper.     . ferrous sulfate 325 (65 FE) MG EC tablet Take 325 mg by mouth daily after supper.     . fluticasone (FLONASE) 50 MCG/ACT nasal spray Place 2 sprays into both nostrils as needed for allergies or rhinitis.    . furosemide (LASIX) 20 MG tablet Take 1 tablet (20 mg total) by mouth daily. May take extra tablet daily as needed for weight gain 60 tablet 6  . ketoconazole (NIZORAL) 2 % cream pt uses 1 application topically daily as needed for rash  0  . levothyroxine (SYNTHROID, LEVOTHROID) 175 MCG tablet TAKE 1 TABLET BY MOUTH EVERY DAY BEFORE BREAKFAST 90 tablet 3  . loratadine (CLARITIN) 10 MG tablet Take 10 mg by mouth daily.    Marland Kitchen losartan (COZAAR) 100 MG tablet Take 100 mg by mouth daily.    . Multiple Vitamin (MULTIVITAMIN WITH MINERALS) TABS tablet Take 1 tablet by mouth daily.    Marland Kitchen OMEPRAZOLE PO Take 20 mg by mouth daily.    . potassium chloride (K-DUR) 10 MEQ tablet Take 1 tablet (10 mEq total) by mouth daily. Take extra tablet by mouth when taking extra furosemide 120 tablet 3  . propranolol ER (INDERAL LA) 60 MG 24 hr capsule TAKE 2 CAPS IN THE MORNING TAKE 1 CAP IN THE AFTERNOON, 1 CAP AT NIGHT 360 capsule 2  . Pseudoephedrine-Guaifenesin (MUCINEX D PO) Take 1 capsule by mouth daily.     . Tiotropium Bromide Monohydrate (SPIRIVA RESPIMAT) 2.5 MCG/ACT AERS Inhale 2 puffs into the lungs daily. 3 Inhaler 4   No current facility-administered  medications for this visit.     SURGICAL HISTORY:  Past Surgical History:  Procedure Laterality Date  . APPENDECTOMY  07/10/1957  . BIOPSY THYROID  12/01/2013   ultrasound  . BREAST SURGERY    . BUNIONECTOMY  11/29/1988   bilateral with hammer toes  . CARDIAC CATHETERIZATION  07/2010   , Without significant CAD  . CARDIAC CATHETERIZATION  07/2011   [name]  . CESAREAN SECTION    . CESAREAN SECTION  05/1968, 07/1965  . EYE SURGERY  01/31/2013   eye lid; cataract surgery bilat   . JOINT REPLACEMENT  05/2001   left knee  . JOINT REPLACEMENT  11/2001   right knee  . JOINT REPLACEMENT  01/2008   redo right knee  . KNEE SURGERY  2009   ,Total knee replacement  . LEFT HEART CATHETERIZATION WITH CORONARY ANGIOGRAM N/A 08/14/2011   Procedure: LEFT HEART CATHETERIZATION WITH CORONARY ANGIOGRAM;  Surgeon: [name], MD;  Location[facility] C[facility] LAB;  Service: Cardiovascular;  Laterality: N/A;  . Left Rotator Cuff    . MASTECTOMY  10/19/1998   Radical, left -with lymph nodes (8 total)  . Reverse bunionectomy  08/29/1993   removal of Tibial Sesamoid-left  . TEE WITHOUT CARDIOVERSION  11/19/2011   Procedure: TRANSESOPHAGEAL ECHOCARDIOGRAM (TEE);  Surgeon: [name], MD;  Location: [facility] ENDOSCOPY;  Service: Cardiovascular;  Laterality: N/A;  . THYROIDECTOMY N/A 03/02/2015   Procedure: TOTAL THYROIDECTOMY;  Surgeon: Armandina Gemma, MD;  Location: [facility] ORS;  Service: General;  Laterality: N/A;  . TONSILLECTOMY    . TOTAL HIP ARTHROPLASTY Left 10/11/2014   Procedure: LEFT TOTAL HIP ARTHROPLASTY ANTERIOR APPROACH;  Surgeon: [name], MD;  Location: [facility] ORS;  Service: Orthopedics;  Laterality: Left;    REVIEW OF SYSTEMS:  Constitutional: positive for fatigue Eyes: negative Ears, nose, mouth, throat, and face: negative Respiratory: positive for dyspnea on exertion Cardiovascular: negative Gastrointestinal: negative Genitourinary:negative Integument/breast: negative Hematologic/lymphatic:  negative Musculoskeletal:negative Neurological: negative Behavioral/Psych: negative Endocrine: negative Allergic/Immunologic: negative   PHYSICAL EXAMINATION: General appearance: alert, cooperative, fatigued and no distress Head: Normocephalic, without obvious abnormality, atraumatic Neck: no adenopathy, no JVD, supple, symmetrical, trachea midline and thyroid not enlarged, symmetric, no tenderness/mass/nodules Lymph nodes: Cervical, supraclavicular, and axillary nodes normal. Resp: clear to auscultation bilaterally Back: symmetric, no curvature. ROM normal. No CVA tenderness. Cardio: regular rate and rhythm, S1, S2 normal, no murmur, click, rub or gallop GI: soft, non-tender; bowel sounds normal; no masses,  no organomegaly Extremities: extremities normal, atraumatic, no cyanosis or edema Neurologic: Alert and oriented X 3, normal strength and tone. Normal symmetric reflexes. Normal coordination and gait  ECOG PERFORMANCE STATUS: 1 - Symptomatic but completely ambulatory  Blood pressure 123/65, pulse 80, temperature 97.7 F (36.5 C), temperature source Oral, resp. rate 19, height '5\' 6"'  (1.676 m), weight 230 lb 3.2 oz (104.4 kg), SpO2 96 %.  LABORATORY DATA: Lab Results  Component Value Date   WBC 4.1 02-20-18   HGB 11.3 (L) 2018-02-20   HCT 37.8 02/20/18   MCV 96.9 02/20/18   PLT 291 2018/02/20      Chemistry      Component Value Date/Time   NA 140 01/20/2018 1001   NA 142 05/21/2017 1027   NA 141 07/15/2016 0906   K 4.1 01/20/2018 1001   K 4.0 07/15/2016 0906   CL 107 01/20/2018 1001   CO2 23 01/20/2018 1001   CO2 25 07/15/2016 0906   BUN 29 (H) 01/20/2018 1001   BUN 33 (H) 05/21/2017 1027   BUN 20.7 07/15/2016 0906   CREATININE 1.41 (H) 01/20/2018 1001   CREATININE 1.1 07/15/2016 0906      Component Value Date/Time   CALCIUM 9.1 01/20/2018 1001   CALCIUM 9.6 07/15/2016 0906   ALKPHOS 26 (L) 01/20/2018 1001   ALKPHOS 29 (L) 07/15/2016 0906   AST 21  01/20/2018 1001   AST 19 07/15/2016 0906   ALT 10 01/20/2018 1001   ALT 13 07/15/2016 0906   BILITOT 1.0 01/20/2018 1001   BILITOT 1.13 07/15/2016 0906       RADIOGRAPHIC STUDIES: Ct Biopsy  Result Date: 2018-02-20 INDICATION: 77 year old female with a history of lymphadenopathy and known lymphoma. She presents for biopsy of retroperitoneal adenopathy EXAM: CT BIOPSY MEDICATIONS: None. ANESTHESIA/SEDATION: Moderate (conscious) sedation was employed during this procedure. A total of Versed 1.0 mg and Fentanyl 50 mcg was administered intravenously. Moderate Sedation Time: 13 minutes. The patient's level of consciousness  and vital signs were monitored continuously by radiology nursing throughout the procedure under my direct supervision. FLUOROSCOPY TIME:  CT COMPLICATIONS: None PROCEDURE: Informed written consent was obtained from the patient after a thorough discussion of the procedural risks, benefits and alternatives. All questions were addressed. Maximal Sterile Barrier Technique was utilized including caps, mask, sterile gowns, sterile gloves, sterile drape, hand hygiene and skin antiseptic. A timeout was performed prior to the initiation of the procedure. Patient position right decubitus. Scout images were acquired. The patient is then prepped and draped in the usual sterile fashion. 1% lidocaine was used for local anesthesia. Using CT guidance, 20 cm guide needle was placed targeting lymphatic tissue within the left retroperitoneum. Once the needle was in position multiple 18 gauge core biopsy were acquired. Patient tolerated the procedure well and remained hemodynamically stable throughout. No complications were encountered and no significant blood loss. IMPRESSION: Status post CT-guided biopsy of retroperitoneal soft tissue. Tissue specimen sent to pathology for complete histopathologic analysis. Signed, Dulcy Fanny. Dellia Nims, RPVI [facility]  Radiology Electronically Signed   By: Corrie Mckusick D.O.   On: 02/05/2018 12:14    ASSESSMENT AND PLAN:  This is a very pleasant 77 years old white female with persistent lymphadenopathy as well as mild hepatosplenomegaly suspicious for low-grade lymphoma. The patient is currently on observation. She had a repeat CT scan of the chest, abdomen and pelvis that showed further increase in the lymphadenopathy in the chest and retroperitoneum. The patient recently underwent CT-guided core biopsy of retroperitoneal lymph node in addition to bone marrow biopsy and aspirate.  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 I had a lengthy discussion with the patient and her family about her current condition and treatment options.  I discussed with him the option of continuous observation and close monitoring as the patient is currently asymptomatic versus consideration of treatment with combination of chemotherapy with Rituxan. After discussion of the options in details, the patient and her family would like to continue on observation for now. I will see her back for follow-up visit in 6 months for evaluation with repeat CT scan of the chest, abdomen and pelvis for restaging of her disease.  The patient was advised to call immediately if she develop any symptoms in the interval. The patient voices understanding of current disease status and treatment options and is in agreement with the current care plan. All questions were answered. The patient knows to call the clinic with any problems, questions or concerns. We can certainly see the patient much sooner if necessary.  Disclaimer: This note was dictated with voice recognition software. Similar sounding words can inadvertently be transcribed and may not be corrected upon review.

## 2018-02-23 NOTE — Telephone Encounter (Signed)
Printed avs and calender of upcoming appointment. Per 11/26 los 

## 2018-02-24 ENCOUNTER — Other Ambulatory Visit: Payer: Self-pay | Admitting: *Deleted

## 2018-02-24 MED ORDER — ALBUTEROL SULFATE HFA 108 (90 BASE) MCG/ACT IN AERS: 2.0000 | INHALATION_SPRAY | RESPIRATORY_TRACT | 0 refills | Status: DC | PRN

## 2018-03-08 ENCOUNTER — Other Ambulatory Visit: Payer: Self-pay | Admitting: Pharmacist

## 2018-03-08 NOTE — Patient Outreach (Signed)
[name]) Care Management  03/08/2018  [name] 10/13/1940 [id number]   Called patient about medication assistance forms for Spiriva.  HIPAA identifiers were obtained.  Patient confirmed she is still using Spiriva and would like to pursue patient assistance programming for next year.   Patient said she has enough medication to last her until February and will complete the forms after Christmas.   Plan: Route letter to [place] to send an application to the patient.   [name], PharmD, [place] Clinical Pharmacist [phone]

## 2018-03-09 ENCOUNTER — Other Ambulatory Visit: Payer: Self-pay | Admitting: Pharmacy Technician

## 2018-03-09 NOTE — Patient Outreach (Signed)
[place] [facility]) Care Management  03/09/2018  MITSUYE SCHRODT 31-Jul-1940 [id number]   Received 2020 patient assistance referral from [place] for Spiriva Respimat through B-I.  Prepared patient portion to be mailed. Prepared provider portion to be faxed.  Will followup in 5-7 business days to inquire if patient has received the application.  Anjali Manzella P. Ellianah Cordy, [place] Management [phone]

## 2018-03-17 ENCOUNTER — Other Ambulatory Visit: Payer: Self-pay | Admitting: Pharmacy Technician

## 2018-03-17 NOTE — Patient Outreach (Signed)
[place] [facility]) Care Management  03/17/2018  [name] May 21, 1940 [id number]   Successful outreach call placed to patient in regards to her [employer]-I patient assistance application for Spiriva.  Spoke to [name], HIPPA identifiers verified. Inquired if she ha received her applications in the mail and she stated she had received them. She informed me that she has not had a chance to go over them yet as she has been sick fo a few days. She stated she hopes to have them completed and in the mail before the holiday.  Will followup with patient in 10-14 business days if applications have not been received.  Basim Bartnik P. Moksh Loomer, [place] Management [phone]

## 2018-03-27 DIAGNOSIS — Z96653 Presence of artificial knee joint, bilateral: Secondary | ICD-10-CM | POA: Diagnosis not present

## 2018-03-29 DIAGNOSIS — L84 Corns and callosities: Secondary | ICD-10-CM | POA: Diagnosis not present

## 2018-03-29 DIAGNOSIS — M2031 Hallux varus (acquired), right foot: Secondary | ICD-10-CM | POA: Diagnosis not present

## 2018-03-30 ENCOUNTER — Other Ambulatory Visit: Payer: Self-pay | Admitting: Pharmacy Technician

## 2018-03-30 NOTE — Patient Outreach (Signed)
[place] [facility]) Care Management  03/30/2018  Kristin Hood May 16, 1940 [id number]   Second successful outreach call placed to patient in regards to her BI patient assistance application for Spiriva.  Spoke to Kristin Hood, Kristin Hood verified. Patient states that she had received her ss benefits statement and that she and her daughter are going to fill the application out this evening. She states she will have it in the mail by the end of this week.  Will followup with 3rd outreach call in 10-14 business days if application has not been received.  Kristin Hood P. [name], [place] Management [phone]

## 2018-04-05 ENCOUNTER — Other Ambulatory Visit: Payer: Self-pay | Admitting: Pharmacy Technician

## 2018-04-05 NOTE — Patient Outreach (Signed)
[place] [facility]) Care Management  04/05/2018  Kristin Hood 1941/03/17 [id number]   Received all necessary documents and signatures from both patient and provider for BI patient assistance for Spiriva Respimat.  Submitted completed application to [facility] via fax.  Will followup in 7-10 business days with BI to inquire on status of application.  Joeleen Wortley P. Renn Dirocco, [place] Management [phone]

## 2018-04-15 ENCOUNTER — Other Ambulatory Visit: Payer: Self-pay | Admitting: Pharmacy Technician

## 2018-04-15 NOTE — Patient Outreach (Signed)
[place] [facility]) Care Management  04/15/2018  MAECIE SEVCIK 12/03/1940 [id number]   Care coordination call placed to BI cares in regards to patient's medication assistance application for Spiriva Respimat.   Spoke to [place] who said patient had been APPROVED from 04/09/2018-03/31/2019. Per Hilda Blades patient will be receiving 3 inhalers with in 7-10 business days of the 13th (date of shipment).  Will followup with patient in 10-14 business days to confirm receipt of medication.  Curt Oatis P. Nyiesha Beever, [place] Management [phone]

## 2018-04-20 ENCOUNTER — Other Ambulatory Visit: Payer: Self-pay

## 2018-04-20 MED ORDER — APIXABAN 5 MG PO TABS: 5.0000 mg | ORAL_TABLET | Freq: Two times a day (BID) | ORAL | 0 refills | Status: DC

## 2018-04-28 ENCOUNTER — Other Ambulatory Visit: Payer: Self-pay | Admitting: Pharmacy Technician

## 2018-04-28 NOTE — Patient Outreach (Signed)
[facility] [facility] Care Management  04/28/2018  Kristin Hood 1941/03/02 [id number]   Successful outreach call placed to patient in regards to [facility] application for Spiriva Respimat.  Spoke to patient but she said this was not a good time and asked if she could call me back. Informed patient that either she could call me back or I would be glad to return her call. She said she would call me back after 12. Informed patient I would be in a meeting from 12-2 but I would be available after 2pm. Confirmed patient had my number.  Will followup with patient in 1-2 business days if we are not able to connect later today.  Amish Mintzer P. Jarron Curley, [place] Management [phone]

## 2018-04-28 NOTE — Patient Outreach (Signed)
[place] [facility]) Care Management  04/28/2018  Kristin Hood Mar 08, 1941 [id number]   ADDENDUM  Incoming call received from patient in regards to [facility] patient assistance for Sprivia Respimat.  Patient was returning my call, HIPAA identifiers verified. Inquired of patient if she had received her medication from the [facility] patient assistance foundation. Patient says she received 3 inhalers. Discussed with patient how to obtain her refills by calling the number to [facility] that should be on the label of the medication she received. Patient verbalized understanding.  Patient stated she wished [employer] program (the makers of Eliquis) did not require an OOP spend. Informed patient that unfortunately they do require a 3% OOP from the household. Informed patient to keep my name and number and to call me when she got close to meeting that requirement and then we could proceed with the application process. Patient verbalized understanding.  Inquired if patient had any other questions or concerns at this time and she stated that she did not. She confirmed that she had put my information in her phone in case she has any other questions,problems or concerns.  Will remove myself from care team at this time as the patient assistance has been completed for now.  Jevaughn Degollado P. Eriberto Felch, [place] Management [phone]

## 2018-05-09 ENCOUNTER — Encounter (HOSPITAL_COMMUNITY): Payer: Self-pay

## 2018-05-09 ENCOUNTER — Inpatient Hospital Stay (HOSPITAL_COMMUNITY)
Admission: EM | Admit: 2018-05-09 | Discharge: 2018-05-14 | DRG: 291 | Disposition: A | Discharge status: Home / Self Care | Payer: PPO | Attending: Internal Medicine | Admitting: Internal Medicine

## 2018-05-09 ENCOUNTER — Emergency Department (HOSPITAL_COMMUNITY): Payer: PPO

## 2018-05-09 ENCOUNTER — Other Ambulatory Visit: Payer: Self-pay

## 2018-05-09 DIAGNOSIS — E876 Hypokalemia: Secondary | ICD-10-CM | POA: Diagnosis not present

## 2018-05-09 DIAGNOSIS — I5033 Acute on chronic diastolic (congestive) heart failure: Secondary | ICD-10-CM | POA: Diagnosis not present

## 2018-05-09 DIAGNOSIS — Z86010 Personal history of colonic polyps: Secondary | ICD-10-CM | POA: Diagnosis not present

## 2018-05-09 DIAGNOSIS — Z8672 Personal history of thrombophlebitis: Secondary | ICD-10-CM

## 2018-05-09 DIAGNOSIS — I509 Heart failure, unspecified: Secondary | ICD-10-CM

## 2018-05-09 DIAGNOSIS — I10 Essential (primary) hypertension: Secondary | ICD-10-CM | POA: Diagnosis not present

## 2018-05-09 DIAGNOSIS — Z8249 Family history of ischemic heart disease and other diseases of the circulatory system: Secondary | ICD-10-CM | POA: Diagnosis not present

## 2018-05-09 DIAGNOSIS — I482 Chronic atrial fibrillation, unspecified: Secondary | ICD-10-CM | POA: Diagnosis present

## 2018-05-09 DIAGNOSIS — K219 Gastro-esophageal reflux disease without esophagitis: Secondary | ICD-10-CM | POA: Diagnosis present

## 2018-05-09 DIAGNOSIS — R0602 Shortness of breath: Secondary | ICD-10-CM | POA: Diagnosis not present

## 2018-05-09 DIAGNOSIS — Z79899 Other long term (current) drug therapy: Secondary | ICD-10-CM

## 2018-05-09 DIAGNOSIS — E890 Postprocedural hypothyroidism: Secondary | ICD-10-CM | POA: Diagnosis not present

## 2018-05-09 DIAGNOSIS — E782 Mixed hyperlipidemia: Secondary | ICD-10-CM | POA: Diagnosis present

## 2018-05-09 DIAGNOSIS — I5031 Acute diastolic (congestive) heart failure: Secondary | ICD-10-CM

## 2018-05-09 DIAGNOSIS — Z87891 Personal history of nicotine dependence: Secondary | ICD-10-CM

## 2018-05-09 DIAGNOSIS — R05 Cough: Secondary | ICD-10-CM | POA: Diagnosis not present

## 2018-05-09 DIAGNOSIS — N179 Acute kidney failure, unspecified: Secondary | ICD-10-CM | POA: Diagnosis not present

## 2018-05-09 DIAGNOSIS — J9601 Acute respiratory failure with hypoxia: Secondary | ICD-10-CM

## 2018-05-09 DIAGNOSIS — I4891 Unspecified atrial fibrillation: Secondary | ICD-10-CM | POA: Diagnosis present

## 2018-05-09 DIAGNOSIS — J309 Allergic rhinitis, unspecified: Secondary | ICD-10-CM | POA: Diagnosis not present

## 2018-05-09 DIAGNOSIS — M1990 Unspecified osteoarthritis, unspecified site: Secondary | ICD-10-CM | POA: Diagnosis present

## 2018-05-09 DIAGNOSIS — Z6841 Body Mass Index (BMI) 40.0 and over, adult: Secondary | ICD-10-CM

## 2018-05-09 DIAGNOSIS — N182 Chronic kidney disease, stage 2 (mild): Secondary | ICD-10-CM | POA: Diagnosis not present

## 2018-05-09 DIAGNOSIS — C8590 Non-Hodgkin lymphoma, unspecified, unspecified site: Secondary | ICD-10-CM | POA: Diagnosis present

## 2018-05-09 DIAGNOSIS — G250 Essential tremor: Secondary | ICD-10-CM | POA: Diagnosis present

## 2018-05-09 DIAGNOSIS — Z8673 Personal history of transient ischemic attack (TIA), and cerebral infarction without residual deficits: Secondary | ICD-10-CM

## 2018-05-09 DIAGNOSIS — Z9221 Personal history of antineoplastic chemotherapy: Secondary | ICD-10-CM

## 2018-05-09 DIAGNOSIS — Z96642 Presence of left artificial hip joint: Secondary | ICD-10-CM | POA: Diagnosis present

## 2018-05-09 DIAGNOSIS — Z79890 Hormone replacement therapy (postmenopausal): Secondary | ICD-10-CM

## 2018-05-09 DIAGNOSIS — Z7901 Long term (current) use of anticoagulants: Secondary | ICD-10-CM | POA: Diagnosis not present

## 2018-05-09 DIAGNOSIS — J449 Chronic obstructive pulmonary disease, unspecified: Secondary | ICD-10-CM | POA: Diagnosis present

## 2018-05-09 DIAGNOSIS — R079 Chest pain, unspecified: Secondary | ICD-10-CM | POA: Diagnosis not present

## 2018-05-09 DIAGNOSIS — Z96653 Presence of artificial knee joint, bilateral: Secondary | ICD-10-CM | POA: Diagnosis present

## 2018-05-09 DIAGNOSIS — Z888 Allergy status to other drugs, medicaments and biological substances status: Secondary | ICD-10-CM

## 2018-05-09 DIAGNOSIS — Z853 Personal history of malignant neoplasm of breast: Secondary | ICD-10-CM | POA: Diagnosis not present

## 2018-05-09 DIAGNOSIS — D509 Iron deficiency anemia, unspecified: Secondary | ICD-10-CM | POA: Diagnosis present

## 2018-05-09 DIAGNOSIS — I130 Hypertensive heart and chronic kidney disease with heart failure and stage 1 through stage 4 chronic kidney disease, or unspecified chronic kidney disease: Secondary | ICD-10-CM | POA: Diagnosis not present

## 2018-05-09 DIAGNOSIS — I110 Hypertensive heart disease with heart failure: Secondary | ICD-10-CM | POA: Diagnosis not present

## 2018-05-09 DIAGNOSIS — T501X5A Adverse effect of loop [high-ceiling] diuretics, initial encounter: Secondary | ICD-10-CM | POA: Diagnosis not present

## 2018-05-09 DIAGNOSIS — Z9012 Acquired absence of left breast and nipple: Secondary | ICD-10-CM

## 2018-05-09 LAB — LIPASE, BLOOD: Lipase: 46 U/L (ref 11–51)

## 2018-05-09 LAB — COMPREHENSIVE METABOLIC PANEL
ALT: 12 U/L (ref 0–44)
ANION GAP: 10 (ref 5–15)
AST: 26 U/L (ref 15–41)
Albumin: 3.5 g/dL (ref 3.5–5.0)
Alkaline Phosphatase: 25 U/L — ABNORMAL LOW (ref 38–126)
BUN: 25 mg/dL — ABNORMAL HIGH (ref 8–23)
CHLORIDE: 102 mmol/L (ref 98–111)
CO2: 27 mmol/L (ref 22–32)
Calcium: 9.5 mg/dL (ref 8.9–10.3)
Creatinine, Ser: 1 mg/dL (ref 0.44–1.00)
GFR calc non Af Amer: 54 mL/min — ABNORMAL LOW (ref >60)
Glucose, Bld: 115 mg/dL — ABNORMAL HIGH (ref 70–99)
POTASSIUM: 4.2 mmol/L (ref 3.5–5.1)
SODIUM: 139 mmol/L (ref 135–145)
Total Bilirubin: 1.8 mg/dL — ABNORMAL HIGH (ref 0.3–1.2)
Total Protein: 8.5 g/dL — ABNORMAL HIGH (ref 6.5–8.1)

## 2018-05-09 LAB — CBC WITH DIFFERENTIAL/PLATELET
ABS IMMATURE GRANULOCYTES: 0.03 10*3/uL (ref 0.00–0.07)
Basophils Absolute: 0 10*3/uL (ref 0.0–0.1)
Basophils Relative: 0 %
Eosinophils Absolute: 0 10*3/uL (ref 0.0–0.5)
Eosinophils Relative: 1 %
HEMATOCRIT: 37 % (ref 36.0–46.0)
HEMOGLOBIN: 10.9 g/dL — AB (ref 12.0–15.0)
Immature Granulocytes: 1 %
LYMPHS PCT: 9 %
Lymphs Abs: 0.6 10*3/uL — ABNORMAL LOW (ref 0.7–4.0)
MCH: 27.3 pg (ref 26.0–34.0)
MCHC: 29.5 g/dL — AB (ref 30.0–36.0)
MCV: 92.5 fL (ref 80.0–100.0)
MONO ABS: 0.4 10*3/uL (ref 0.1–1.0)
MONOS PCT: 7 %
NEUTROS ABS: 5.2 10*3/uL (ref 1.7–7.7)
Neutrophils Relative %: 82 %
PLATELETS: 277 10*3/uL (ref 150–400)
RBC: 4 MIL/uL (ref 3.87–5.11)
RDW: 15.1 % (ref 11.5–15.5)
WBC: 6.3 10*3/uL (ref 4.0–10.5)
nRBC: 0.3 % — ABNORMAL HIGH (ref 0.0–0.2)

## 2018-05-09 LAB — BRAIN NATRIURETIC PEPTIDE: B NATRIURETIC PEPTIDE 5: 548.5 pg/mL — AB (ref 0.0–100.0)

## 2018-05-09 LAB — I-STAT TROPONIN, ED: TROPONIN I, POC: 0.02 ng/mL (ref 0.00–0.08)

## 2018-05-09 MED ORDER — FENOFIBRATE 160 MG PO TABS
160.0000 mg | ORAL_TABLET | Freq: Every day | ORAL | Status: DC
  Administered 2018-05-09 – 2018-05-13 (×5): 160 mg via ORAL
  Filled 2018-05-09 (×5): qty 1

## 2018-05-09 MED ORDER — LORATADINE 10 MG PO TABS
10.0000 mg | ORAL_TABLET | Freq: Every day | ORAL | Status: DC | PRN
  Administered 2018-05-10: 10 mg via ORAL
  Filled 2018-05-09: qty 1

## 2018-05-09 MED ORDER — FLUTICASONE PROPIONATE 50 MCG/ACT NA SUSP
2.0000 | Freq: Every day | NASAL | Status: DC | PRN
  Filled 2018-05-09: qty 16

## 2018-05-09 MED ORDER — ADULT MULTIVITAMIN W/MINERALS CH
1.0000 | ORAL_TABLET | Freq: Every day | ORAL | Status: DC
  Administered 2018-05-10 – 2018-05-14 (×5): 1 via ORAL
  Filled 2018-05-09 (×5): qty 1

## 2018-05-09 MED ORDER — LOSARTAN POTASSIUM 50 MG PO TABS
100.0000 mg | ORAL_TABLET | Freq: Every day | ORAL | Status: DC
  Filled 2018-05-09: qty 2

## 2018-05-09 MED ORDER — ASPIRIN 81 MG PO CHEW
324.0000 mg | CHEWABLE_TABLET | Freq: Once | ORAL | Status: AC
  Administered 2018-05-09: 324 mg via ORAL
  Filled 2018-05-09: qty 4

## 2018-05-09 MED ORDER — SODIUM CHLORIDE 0.9% FLUSH
3.0000 mL | Freq: Two times a day (BID) | INTRAVENOUS | Status: DC
  Administered 2018-05-09 – 2018-05-14 (×9): 3 mL via INTRAVENOUS

## 2018-05-09 MED ORDER — ATORVASTATIN CALCIUM 10 MG PO TABS
20.0000 mg | ORAL_TABLET | Freq: Every day | ORAL | Status: DC
  Administered 2018-05-09 – 2018-05-13 (×5): 20 mg via ORAL
  Filled 2018-05-09 (×5): qty 2

## 2018-05-09 MED ORDER — GUAIFENESIN ER 600 MG PO TB12
600.0000 mg | ORAL_TABLET | Freq: Every day | ORAL | Status: DC
  Administered 2018-05-10 – 2018-05-14 (×5): 600 mg via ORAL
  Filled 2018-05-09 (×5): qty 1

## 2018-05-09 MED ORDER — SODIUM CHLORIDE 0.9% FLUSH: 3.0000 mL | INTRAVENOUS | Status: DC | PRN

## 2018-05-09 MED ORDER — TIOTROPIUM BROMIDE MONOHYDRATE 2.5 MCG/ACT IN AERS: 2.0000 | INHALATION_SPRAY | Freq: Every day | RESPIRATORY_TRACT | Status: DC

## 2018-05-09 MED ORDER — ACETAMINOPHEN 650 MG RE SUPP: 650.0000 mg | Freq: Four times a day (QID) | RECTAL | Status: DC | PRN

## 2018-05-09 MED ORDER — FUROSEMIDE 10 MG/ML IJ SOLN: 20.0000 mg | Freq: Once | INTRAMUSCULAR | Status: DC

## 2018-05-09 MED ORDER — SODIUM CHLORIDE 0.9 % IV SOLN: 250.0000 mL | INTRAVENOUS | Status: DC | PRN

## 2018-05-09 MED ORDER — FERROUS SULFATE 325 (65 FE) MG PO TABS
325.0000 mg | ORAL_TABLET | Freq: Every day | ORAL | Status: DC
  Administered 2018-05-09 – 2018-05-13 (×5): 325 mg via ORAL
  Filled 2018-05-09 (×5): qty 1

## 2018-05-09 MED ORDER — IPRATROPIUM-ALBUTEROL 0.5-2.5 (3) MG/3ML IN SOLN
3.0000 mL | Freq: Once | RESPIRATORY_TRACT | Status: AC
  Administered 2018-05-09: 3 mL via RESPIRATORY_TRACT
  Filled 2018-05-09 (×2): qty 3

## 2018-05-09 MED ORDER — APIXABAN 5 MG PO TABS
5.0000 mg | ORAL_TABLET | Freq: Two times a day (BID) | ORAL | Status: DC
  Administered 2018-05-09 – 2018-05-14 (×10): 5 mg via ORAL
  Filled 2018-05-09 (×11): qty 1

## 2018-05-09 MED ORDER — ACETAMINOPHEN 325 MG PO TABS
650.0000 mg | ORAL_TABLET | Freq: Four times a day (QID) | ORAL | Status: DC | PRN
  Administered 2018-05-10 – 2018-05-13 (×4): 650 mg via ORAL
  Filled 2018-05-09 (×4): qty 2

## 2018-05-09 MED ORDER — UMECLIDINIUM BROMIDE 62.5 MCG/INH IN AEPB
1.0000 | INHALATION_SPRAY | Freq: Every day | RESPIRATORY_TRACT | Status: DC
  Administered 2018-05-11 – 2018-05-14 (×3): 1 via RESPIRATORY_TRACT
  Filled 2018-05-09: qty 7

## 2018-05-09 MED ORDER — ALBUTEROL SULFATE (2.5 MG/3ML) 0.083% IN NEBU
5.0000 mg | INHALATION_SOLUTION | Freq: Once | RESPIRATORY_TRACT | Status: DC
  Filled 2018-05-09: qty 6

## 2018-05-09 MED ORDER — FUROSEMIDE 10 MG/ML IJ SOLN
40.0000 mg | Freq: Two times a day (BID) | INTRAMUSCULAR | Status: DC
  Administered 2018-05-09 – 2018-05-11 (×5): 40 mg via INTRAVENOUS
  Filled 2018-05-09 (×5): qty 4

## 2018-05-09 MED ORDER — PANTOPRAZOLE SODIUM 40 MG PO TBEC
40.0000 mg | DELAYED_RELEASE_TABLET | Freq: Every day | ORAL | Status: DC
  Administered 2018-05-10 – 2018-05-14 (×5): 40 mg via ORAL
  Filled 2018-05-09 (×5): qty 1

## 2018-05-09 MED ORDER — LEVOTHYROXINE SODIUM 75 MCG PO TABS
175.0000 ug | ORAL_TABLET | Freq: Every day | ORAL | Status: DC
  Administered 2018-05-10 – 2018-05-14 (×5): 175 ug via ORAL
  Filled 2018-05-09 (×5): qty 1

## 2018-05-09 MED ORDER — PROPRANOLOL HCL ER 60 MG PO CP24
120.0000 mg | ORAL_CAPSULE | Freq: Two times a day (BID) | ORAL | Status: DC
  Administered 2018-05-10 – 2018-05-14 (×7): 120 mg via ORAL
  Filled 2018-05-09 (×6): qty 2
  Filled 2018-05-09: qty 1
  Filled 2018-05-09: qty 2
  Filled 2018-05-09: qty 1
  Filled 2018-05-09 (×2): qty 2

## 2018-05-09 NOTE — ED Provider Notes (Signed)
I saw and evaluated the patient, reviewed the resident's note and I agree with the findings and plan.  EKG: EKG Interpretation  Date/Time:  Sunday May 09 2018 14:30:29 EST Ventricular Rate:  84 PR Interval:    QRS Duration: 84 QT Interval:  362 QTC Calculation: 427 R Axis:   -36 Text Interpretation:  Atrial fibrillation Left axis deviation Nonspecific ST and T wave abnormality Abnormal ECG No significant change since last tracing Confirmed by Lacretia Leigh ([id number]) on 05/09/2018 3:49:33 PM  78 year old female presents with cough and shortness of breath times several days.  Does have history of CHF and this will similar.  On exam she has rales in her bases.  Also has pedal edema.  Awaiting chest x-ray and blood work.   Lacretia Leigh, MD 05/09/18 [phone]

## 2018-05-09 NOTE — ED Notes (Signed)
Report attempted 

## 2018-05-09 NOTE — ED Notes (Signed)
Pt placed on 2L [place]

## 2018-05-09 NOTE — ED Triage Notes (Signed)
Pt reports shortness of breath, cough, and low oxygen saturations in the 80s on room air, using inhalers at home without relief. Respirations labored.

## 2018-05-09 NOTE — H&P (Signed)
History and Physical    Kristin Hood  MHD:[id number]  DOB: [birth year]  DOA: 05/09/2018 PCP: [name], MD   Patient coming from: home  Chief Complaint: shortness of breath  HPI: Kristin Hood is a 78 y.o. female with medical history of diastolic CHF, A-fib, HTN, tremors who presents for shortness of breath and is found to be hypoxic and have pulmonary edema. She has been taking her Lasix regularly and actually took 2 today but was still short of breath. She has had more than usual sputum as well but this is clear. No fevers. She has no other complaints.   ED Course: 91% on 2 L  CXR : Cardiomegaly pulmonary vascular congestion  Review of Systems:  All other systems reviewed and apart from HPI, are negative.  Past Medical History:  Diagnosis Date  . Acute congestive heart failure ([place])   . Acute diastolic CHF (congestive heart failure) ([place]) 12/30/2016  . Acute respiratory failure with hypoxemia ([place]) 06/09/2016  . Acute respiratory failure with hypoxia ([place]) 12/30/2016  . Anginal pain ([place])   . ARF (acute renal failure) ([place]) 06/11/2016  . Arthritis   . Atrial fibrillation ([place]) 07/2012   Eliquis, rate controlled  . Benign hypertensive heart disease without heart failure 12/25/2012  . Breast cancer ([place]) 2001   left mastectomy  . Chest pain 12/11/2012  . CHF (congestive heart failure) ([place]) 06/10/2016  . Chronic atrial fibrillation 11/16/2014  . Chronic diastolic heart failure ([place]) 02/13/2014  . Coarse tremors    , Essential  . Complication of anesthesia    SMALLER TUBE FOR INTUBATION AS GAGS ON TUBE  . COPD (chronic obstructive pulmonary disease) ([place])   . COPD exacerbation ([place]) 12/30/2016  . CVA (cerebral vascular accident) ([place]) 2013  . Dysrhythmia 08/20/2012   Atrial Fibrillation  . Essential and other specified forms of tremor 06/18/2012  . Essential hypertension 06/18/2012  . Family history of adverse reaction to anesthesia    mother experienced pain with  intubation   . GERD (gastroesophageal reflux disease)    history of   . Hard of hearing   . Heart murmur   . History of chemotherapy 11/13/1998 to 2010   Adriamycin/Docotaxol, Dexorubicin/Taxotere, Femara, Tamoxifen  . History of CVA (cerebrovascular accident) 12/11/2012  . History of hiatal hernia    history of   . Hx of adenomatous colonic polyps 2006   , Benign  . Hypercholesteremia   . Hyperlipidemia 02/13/2014  . Hypertension    , With mild concentric left ventricular hypertrophy  . Long term current use of anticoagulant therapy 12/25/2012  . Mixed dyslipidemia   . Mixed hyperlipidemia 12/25/2012  . Morbid obesity ([place]) 06/18/2012  . Phlebitis and thrombophlebitis of superficial vessels of lower extremities 12/25/2012  . 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 Pure hypercholesterolemia 12/25/2012  . Shingles   . Shortness of breath dyspnea    on exertion   . SOB (shortness of breath) 11/16/2014  . Spider veins   . Stroke ([place]) 11/17/2011   left side brain-speech-TPA  . Thyroid nodule   . Tremor, essential   . Unspecified cerebral artery occlusion with cerebral infarction 11/18/2011  . Valvular sclerosis 09/23/2003   without stenosis  . Varicose veins     Past Surgical History:  Procedure Laterality Date  . APPENDECTOMY  07/10/1957  . BIOPSY THYROID  12/01/2013   ultrasound  . BREAST SURGERY    . BUNIONECTOMY  11/29/1988   bilateral with hammer toes  . CARDIAC CATHETERIZATION  07/2010   , Without significant CAD  . CARDIAC CATHETERIZATION  07/2011   [name]  . CESAREAN SECTION    . CESAREAN SECTION  05/1968, 07/1965  . EYE SURGERY  01/31/2013   eye lid; cataract surgery bilat   . JOINT REPLACEMENT  05/2001   left knee  . JOINT REPLACEMENT  11/2001   right knee  . JOINT REPLACEMENT  01/2008   redo right knee  . KNEE SURGERY  2009   ,Total knee replacement  . LEFT HEART CATHETERIZATION WITH CORONARY ANGIOGRAM N/A 08/14/2011   Procedure: LEFT HEART  CATHETERIZATION WITH CORONARY ANGIOGRAM;  Surgeon: [name], MD;  Location: [facility] CATH LAB;  Service: Cardiovascular;  Laterality: N/A;  . Left Rotator Cuff    . MASTECTOMY  10/19/1998   Radical, left -with lymph nodes (8 total)  . Reverse bunionectomy  08/29/1993   removal of Tibial Sesamoid-left  . TEE WITHOUT CARDIOVERSION  11/19/2011   Procedure: TRANSESOPHAGEAL ECHOCARDIOGRAM (TEE);  Surgeon: [name], MD;  Location: [facility] ENDOSCOPY;  Service: Cardiovascular;  Laterality: N/A;  . THYROIDECTOMY N/A 03/02/2015   Procedure: TOTAL THYROIDECTOMY;  Surgeon: Armandina Gemma, MD;  Location: [facility] ORS;  Service: General;  Laterality: N/A;  . TONSILLECTOMY    . TOTAL HIP ARTHROPLASTY Left 10/11/2014   Procedure: LEFT TOTAL HIP ARTHROPLASTY ANTERIOR APPROACH;  Surgeon: [name], MD;  Location: [facility] ORS;  Service: Orthopedics;  Laterality: Left;    Social History:   reports that she quit smoking about 26 years ago. Her smoking use included cigarettes. She has a 60.00 pack-year smoking history. She has never used smokeless tobacco. She reports current alcohol use of about 1.0 standard drinks of alcohol per week. She reports that she does not use drugs.  Allergies  Allergen Reactions  . Floxin [Ofloxacin] Nausea Only and Other (See Comments)    Dizziness, Vertigo     Family History  Problem Relation Age of Onset  . Heart disease Mother   . Aortic stenosis Mother   . Heart disease Father   . Heart failure Father   . Heart attack Father      Prior to Admission medications   Medication Sig Start Date End Date Taking? Authorizing Provider  albuterol (PROVENTIL HFA;VENTOLIN HFA) 108 (90 Base) MCG/ACT inhaler Inhale 2 puffs into the lungs every 4 (four) hours as needed for wheezing or shortness of breath. 02/24/18  Yes Collene Gobble, MD  amLODipine (NORVASC) 5 MG tablet Take 5 mg by mouth daily.   Yes [provider]  amoxicillin (AMOXIL) 500 MG capsule Take 2,000 mg by mouth See admin  instructions. Take four capsules (2000 mg) by mouth one hour prior to dental appointment.   Yes [provider]  apixaban (ELIQUIS) 5 MG TABS tablet Take 1 tablet (5 mg total) by mouth 2 (two) times daily. 04/20/18  Yes Jerline Pain, MD  atorvastatin (LIPITOR) 20 MG tablet Take 20 mg by mouth at bedtime.    Yes [provider]  fenofibrate 160 MG tablet Take 160 mg by mouth at bedtime.    Yes [provider]  ferrous sulfate 325 (65 FE) MG EC tablet Take 325 mg by mouth at bedtime.    Yes [provider]  fluticasone (FLONASE) 50 MCG/ACT nasal spray Place 2 sprays into both nostrils daily as needed for allergies or rhinitis.    Yes [provider]  furosemide (LASIX) 20 MG tablet Take 1 tablet (20 mg total) by mouth daily. May take  extra tablet daily as needed for weight gain Patient taking differently: Take 20 mg by mouth See admin instructions. Take one tablet (20 mg) by mouth daily at 2pm, may take an extra tablet (20 mg) if needed for weight gain 02/22/18 05/23/18 Yes Jerline Pain, MD  guaiFENesin (MUCINEX) 600 MG 12 hr tablet Take 600 mg by mouth daily.   Yes [provider]  ketoconazole (NIZORAL) 2 % cream Apply 1 application topically daily as needed for irritation (rash).  01/24/15  Yes [provider]  levothyroxine (SYNTHROID, LEVOTHROID) 175 MCG tablet TAKE 1 TABLET BY MOUTH EVERY DAY BEFORE BREAKFAST Patient taking differently: Take 175 mcg by mouth daily before breakfast.  02/15/18  Yes Philemon Kingdom, MD  loratadine (CLARITIN) 10 MG tablet Take 10 mg by mouth daily as needed for allergies (rash).    Yes [provider]  losartan (COZAAR) 100 MG tablet Take 100 mg by mouth daily.   Yes [provider]  Multiple Vitamin (MULTIVITAMIN WITH MINERALS) TABS tablet Take 1 tablet by mouth daily at 2 PM.    Yes [provider]  omeprazole (PRILOSEC) 20 MG capsule Take 20 mg by mouth daily at 2 PM.     Yes [provider]  polyvinyl alcohol (ARTIFICIAL TEARS) 1.4 % ophthalmic solution Place 1 drop into both eyes daily as needed for dry eyes.   Yes [provider]  potassium chloride (K-DUR) 10 MEQ tablet Take 1 tablet (10 mEq total) by mouth daily. Take extra tablet by mouth when taking extra furosemide Patient taking differently: Take 10 mEq by mouth See admin instructions. Take one tablet (10 meq) by mouth daily at 2pm, may take an extra tablet (10 meq) if taking an extra furosemide that day 01/06/17 07/08/18 Yes Isaiah Serge, NP  propranolol ER (INDERAL LA) 60 MG 24 hr capsule TAKE 2 CAPS IN THE MORNING TAKE 1 CAP IN THE AFTERNOON, 1 CAP AT NIGHT Patient taking differently: Take 120 mg by mouth 2 (two) times daily.  10/06/17  Yes Isaiah Serge, NP  Tiotropium Bromide Monohydrate (SPIRIVA RESPIMAT) 2.5 MCG/ACT AERS Inhale 2 puffs into the lungs daily. Patient taking differently: Inhale 2 puffs into the lungs at bedtime.  08/10/17  Yes Collene Gobble, MD    Physical Exam: Wt Readings from Last 3 Encounters:  02/23/18 104.4 kg  01/25/18 106 kg  12/03/17 108 kg   Vitals:   05/09/18 1442 05/09/18 1530  BP: 122/70 (!) 122/92  Pulse: 83 85  Resp: 18 (!) 22  Temp: 98 F (36.7 C)   TempSrc: Oral   SpO2: 91% 92%      Constitutional:  Calm & comfortable Eyes: PERRLA, lids and conjunctivae normal ENT:  Mucous membranes are moist.  Pharynx clear of exudate   Normal dentition.  Neck: Supple, no masses  Respiratory:  Crackles in bilateral bases. On 2 L O2 with pulse ox of 91%.  Normal respiratory effort.  Cardiovascular:  S1 & S2 heard, IIRR No Murmurs Abdomen:  Non distended No tenderness, No masses Bowel sounds normal Extremities:  No clubbing / cyanosis No pedal edema No joint deformity    Skin:  No rashes, lesions or ulcers Neurologic:  AAO x 3 CN 2-12 grossly intact Sensation intact Strength 5/5 in all 4 extremities Psychiatric:  Normal Mood and  affect    Labs on Admission: I have personally reviewed following labs and imaging studies  CBC: Recent Labs  Lab 05/09/18 1543  WBC 6.3  NEUTROABS 5.2  HGB 10.9*  HCT 37.0  MCV 92.5  PLT 161   Basic Metabolic Panel: Recent Labs  Lab 05/09/18 1543  NA 139  K 4.2  CL 102  CO2 27  GLUCOSE 115*  BUN 25*  CREATININE 1.00  CALCIUM 9.5   GFR: CrCl cannot be calculated (Unknown ideal weight.). Liver Function Tests: Recent Labs  Lab 05/09/18 1543  AST 26  ALT 12  ALKPHOS 25*  BILITOT 1.8*  PROT 8.5*  ALBUMIN 3.5   Recent Labs  Lab 05/09/18 1543  LIPASE 46   No results for input(s): AMMONIA in the last 168 hours. Coagulation Profile: No results for input(s): INR, PROTIME in the last 168 hours. Cardiac Enzymes: No results for input(s): CKTOTAL, CKMB, CKMBINDEX, TROPONINI in the last 168 hours. BNP (last 3 results) No results for input(s): PROBNP in the last 8760 hours. HbA1C: No results for input(s): HGBA1C in the last 72 hours. CBG: No results for input(s): GLUCAP in the last 168 hours. Lipid Profile: No results for input(s): CHOL, HDL, LDLCALC, TRIG, CHOLHDL, LDLDIRECT in the last 72 hours. Thyroid Function Tests: No results for input(s): TSH, T4TOTAL, FREET4, T3FREE, THYROIDAB in the last 72 hours. Anemia Panel: No results for input(s): VITAMINB12, FOLATE, FERRITIN, TIBC, IRON, RETICCTPCT in the last 72 hours. Urine analysis:    Component Value Date/Time   COLORURINE YELLOW 10/04/2014 1457   APPEARANCEUR CLOUDY (A) 10/04/2014 1457   LABSPEC 1.025 10/04/2014 1457   PHURINE 6.5 10/04/2014 1457   GLUCOSEU NEGATIVE 10/04/2014 1457   HGBUR NEGATIVE 10/04/2014 1457   [place] 10/04/2014 [place] 10/04/2014 1457   PROTEINUR NEGATIVE 10/04/2014 1457   UROBILINOGEN 1.0 10/04/2014 1457   NITRITE NEGATIVE 10/04/2014 1457   LEUKOCYTESUR MODERATE (A) 10/04/2014 1457   Sepsis Labs: @LABRCNTIP (procalcitonin:4,lacticidven:4) )No  results found for this or any previous visit (from the past 240 hour(s)).   Radiological Exams on Admission: Dg Chest 2 View  Result Date: 05/09/2018 CLINICAL DATA:  Shortness of breath, cough, hypoxia EXAM: CHEST - 2 VIEW COMPARISON:  CT chest dated 01/20/2018 FINDINGS: Cardiomegaly with pulmonary vascular congestion. No frank interstitial edema. Small right pleural effusion. Associated small bilateral lower lobe opacities, likely atelectasis. Right lower lobe pneumonia is not excluded. No pneumothorax. IMPRESSION: Cardiomegaly pulmonary vascular congestion. No frank interstitial edema. Small right pleural effusion. Mild bilateral lower lobe opacities, likely atelectasis. Right lower lobe pneumonia is not excluded. Electronically Signed   By: Julian Hy M.D.   On: 05/09/2018 17:04    EKG: Independently reviewed. A- fib at 84 bpm  Assessment/Plan Principal Problem:   Acute diastolic CHF (congestive heart failure)    Acute respiratory failure with hypoxemia  - she has a h/o diastolic dysfunction and despite taking her Lasix appropriately, has put on fluid- no c/o recent chest pain- ? If her daily dose of Lasix needs to be increased - will start IV Lasix, check I and O and daily weights - cont Losartan  Active Problems:     Essential hypertension - cont Propranolol which she uses for A-fib and essential tremor - cont Losartan- hold Amlodipine ( BP slightly low)    Atrial fibrillation - cont Elliquis and Propranolol- rate controlled currently  Hypothyroidism  -cont synthriod   Non-Hodgkin's lymphoma - currently under surveillance only by Dr [name] - per patient, it has not "grown" in the past 2 yrs  H/o Breast cancer - in remision   DVT prophylaxis: Eliquis  Code Status: Do not intubate  Family Communication: son at bedside  Disposition Plan: home when diuresed Consults called: none  Admission status: inpateint    Debbe Odea MD [facility] Pager: [email or web address] Password [email or web address] [phone], please contact night-coverage   05/09/2018, 5:27 PM

## 2018-05-09 NOTE — ED Provider Notes (Signed)
[facility] DEPARTMENT Provider Note   CSN: [id number] Arrival date & time: 05/09/18  1421     History   Chief Complaint Chief Complaint  Patient presents with  . Shortness of Breath  . Cough    HPI Kristin Hood is a 78 y.o. female.  HPI   Kristin Hood is a 78 y.o. female with PMH of CHF, hypoxic respiratory failure, angina, acute renal failure, arthritis, chest pain, COPD, CVA, A. fib on Eliquis and propranolol, non-Hodgkin's lymphoma not currently on therapy, history of breast cancer status post left mastectomy, history of thyroidectomy on thyroid replacement who presents with acute on chronic dyspnea.  She states this is been going on for many months or years.  Slowly worsening over the last few days.  Slight cough but is nonproductive.  Does feel like she might have mucus in her chest but no fevers or chills.  No chest pain at this time but reports intermittent and unpredictable achy sensation in her middle, left, and sometimes on the right chest.  Not associated with exertion.  Has had worsening conversational dyspnea for the past 2 to 3 days.  Her weight has remained largely stable and she is within 1 to 2 pounds of her baseline weight.  Has not required increased doses of Lasix at home.  Reports intermittent burning sensation in her bilateral upper epigastric region.  Eating and drinking normally though.  Normal stool and urine output.  No dysuria or frequency.  Does not feel her legs are edematous.  No erythema or swelling that is unilateral.  Compliant with her Eliquis.  Past Medical History:  Diagnosis Date  . Acute congestive heart failure ([place])   . Acute diastolic CHF (congestive heart failure) ([place]) 12/30/2016  . Acute respiratory failure with hypoxemia ([place]) 06/09/2016  . Acute respiratory failure with hypoxia ([place]) 12/30/2016  . Anginal pain ([place])   . ARF (acute renal failure) ([place]) 06/11/2016  . Arthritis   . Atrial fibrillation ([place])  07/2012   Eliquis, rate controlled  . Benign hypertensive heart disease without heart failure 12/25/2012  . Breast cancer ([place]) 2001   left mastectomy  . Chest pain 12/11/2012  . CHF (congestive heart failure) ([name]) 06/10/2016  . Chronic atrial fibrillation 11/16/2014  . Chronic diastolic heart failure ([place]) 02/13/2014  . Coarse tremors    , Essential  . Complication of anesthesia    SMALLER TUBE FOR INTUBATION AS GAGS ON TUBE  . COPD (chronic obstructive pulmonary disease) ([place])   . COPD exacerbation ([place]) 12/30/2016  . CVA (cerebral vascular accident) ([place]) 2013  . Dysrhythmia 08/20/2012   Atrial Fibrillation  . Essential and other specified forms of tremor 06/18/2012  . Essential hypertension 06/18/2012  . Family history of adverse reaction to anesthesia    mother experienced pain with intubation   . GERD (gastroesophageal reflux disease)    history of   . Hard of hearing   . Heart murmur   . History of chemotherapy 11/13/1998 to 2010   Adriamycin/Docotaxol, Dexorubicin/Taxotere, Femara, Tamoxifen  . History of CVA (cerebrovascular accident) 12/11/2012  . History of hiatal hernia    history of   . Hx of adenomatous colonic polyps 2006   , Benign  . Hypercholesteremia   . Hyperlipidemia 02/13/2014  . Hypertension    , With mild concentric left ventricular hypertrophy  . Long term current use of anticoagulant therapy 12/25/2012  . Mixed dyslipidemia   . Mixed hyperlipidemia 12/25/2012  . Morbid  obesity ([place]) 06/18/2012  . Phlebitis and thrombophlebitis of superficial vessels of lower extremities 12/25/2012  . 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 Pure hypercholesterolemia 12/25/2012  . Shingles   . Shortness of breath dyspnea    on exertion   . SOB (shortness of breath) 11/16/2014  . Spider veins   . Stroke ([place]) 11/17/2011   left side brain-speech-TPA  . Thyroid nodule   . Tremor, essential   . Unspecified cerebral artery occlusion with cerebral infarction 11/18/2011  .  Valvular sclerosis 09/23/2003   without stenosis  . Varicose veins     Patient Active Problem List   Diagnosis Date Noted  . Acute CHF (congestive heart failure) ([place]) 05/09/2018  . 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 Trochanteric bursitis of right hip 07/06/2017  . Spinal stenosis of lumbar region 06/15/2017  . Acute respiratory failure with hypoxia ([place]) 12/30/2016  . Acute diastolic CHF (congestive heart failure) ([place]) 12/30/2016  . COPD exacerbation ([place]) 12/30/2016  . Allergic rhinitis 12/09/2016  . ARF (acute renal failure) ([place]) 06/11/2016  . CHF (congestive heart failure) ([place]) 06/10/2016  . Acute respiratory failure with hypoxemia ([place]) 06/09/2016  . Pulmonary nodules   . Acute congestive heart failure ([place])   . Hoarseness 06/04/2015  . 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 COPD (chronic obstructive pulmonary disease) ([place]) 12/28/2014  . Abnormal chest CT 12/07/2014  . Chronic atrial fibrillation ([place]) 11/16/2014  . SOB (shortness of breath) 11/16/2014  . Hypertension 11/16/2014  . OA (osteoarthritis) of hip 10/11/2014  . Lymphadenopathy 07/17/2014  . Hyperlipidemia 02/13/2014  . Chronic diastolic heart failure ([place]) 02/13/2014  . Impaired fasting glucose 12/25/2012  . Mixed hyperlipidemia 12/25/2012  . Pure hypercholesterolemia 12/25/2012  . Benign hypertensive heart disease without heart failure 12/25/2012  . Phlebitis and thrombophlebitis of superficial vessels of lower extremities 12/25/2012  . Abnormal involuntary movements(781.0) 12/25/2012  . Long term current use of anticoagulant therapy 12/25/2012  . Chest pain 12/11/2012  . Atrial fibrillation ([place]) 12/11/2012  . History of CVA (cerebrovascular accident) 12/11/2012  . Morbid obesity ([place]) 06/18/2012  . Other and unspecified hyperlipidemia 06/18/2012  . Essential hypertension 06/18/2012  . Essential and other specified forms of  tremor 06/18/2012  . Stroke ([place]) 11/19/2011  . Unspecified cerebral artery occlusion with cerebral infarction 11/18/2011  . Morbid obesity with BMI of 45.0-49.9, adult ([place]) 11/18/2011    Past Surgical History:  Procedure Laterality Date  . APPENDECTOMY  07/10/1957  . BIOPSY THYROID  12/01/2013   ultrasound  . BREAST SURGERY    . BUNIONECTOMY  11/29/1988   bilateral with hammer toes  . CARDIAC CATHETERIZATION  07/2010   , Without significant CAD  . CARDIAC CATHETERIZATION  07/2011   [name]  . CESAREAN SECTION    . CESAREAN SECTION  05/1968, 07/1965  . EYE SURGERY  01/31/2013   eye lid; cataract surgery bilat   . JOINT REPLACEMENT  05/2001   left knee  . JOINT REPLACEMENT  11/2001   right knee  . JOINT REPLACEMENT  01/2008   redo right knee  . KNEE SURGERY  2009   ,Total knee replacement  . LEFT HEART CATHETERIZATION WITH CORONARY ANGIOGRAM N/A 08/14/2011   Procedure: LEFT HEART CATHETERIZATION WITH CORONARY ANGIOGRAM;  Surgeon: [name], MD;  Location[facility] CATH LAB;  Service: Cardiovascular;  Laterality: N/A;  . Left Rotator Cuff    . MASTECTOMY  10/19/1998   Radical, left -with lymph nodes (8  total)  . Reverse bunionectomy  08/29/1993   removal of Tibial Sesamoid-left  . TEE WITHOUT CARDIOVERSION  11/19/2011   Procedure: TRANSESOPHAGEAL ECHOCARDIOGRAM (TEE);  Surgeon: [name], MD;  Location: [facility] ENDOSCOPY;  Service: Cardiovascular;  Laterality: N/A;  . THYROIDECTOMY N/A 03/02/2015   Procedure: TOTAL THYROIDECTOMY;  Surgeon: Armandina Gemma, MD;  Location: [facility] ORS;  Service: General;  Laterality: N/A;  . TONSILLECTOMY    . TOTAL HIP ARTHROPLASTY Left 10/11/2014   Procedure: LEFT TOTAL HIP ARTHROPLASTY ANTERIOR APPROACH;  Surgeon: [name], MD;  Location: [facility] ORS;  Service: Orthopedics;  Laterality: Left;     OB History   No obstetric history on file.      Home Medications    Prior to Admission medications   Medication Sig Start Date End Date Taking?  Authorizing Provider  albuterol (PROVENTIL HFA;VENTOLIN HFA) 108 (90 Base) MCG/ACT inhaler Inhale 2 puffs into the lungs every 4 (four) hours as needed for wheezing or shortness of breath. 02/24/18  Yes Collene Gobble, MD  amLODipine (NORVASC) 5 MG tablet Take 5 mg by mouth daily.   Yes [provider]  amoxicillin (AMOXIL) 500 MG capsule Take 2,000 mg by mouth See admin instructions. Take four capsules (2000 mg) by mouth one hour prior to dental appointment.   Yes [provider]  apixaban (ELIQUIS) 5 MG TABS tablet Take 1 tablet (5 mg total) by mouth 2 (two) times daily. 04/20/18  Yes Jerline Pain, MD  atorvastatin (LIPITOR) 20 MG tablet Take 20 mg by mouth at bedtime.    Yes [provider]  fenofibrate 160 MG tablet Take 160 mg by mouth at bedtime.    Yes [provider]  ferrous sulfate 325 (65 FE) MG EC tablet Take 325 mg by mouth at bedtime.    Yes [provider]  fluticasone (FLONASE) 50 MCG/ACT nasal spray Place 2 sprays into both nostrils daily as needed for allergies or rhinitis.    Yes [provider]  furosemide (LASIX) 20 MG tablet Take 1 tablet (20 mg total) by mouth daily. May take extra tablet daily as needed for weight gain Patient taking differently: Take 20 mg by mouth See admin instructions. Take one tablet (20 mg) by mouth daily at 2pm, may take an extra tablet (20 mg) if needed for weight gain 02/22/18 05/23/18 Yes Jerline Pain, MD  guaiFENesin (MUCINEX) 600 MG 12 hr tablet Take 600 mg by mouth daily.   Yes [provider]  ketoconazole (NIZORAL) 2 % cream Apply 1 application topically daily as needed for irritation (rash).  01/24/15  Yes [provider]  levothyroxine (SYNTHROID, LEVOTHROID) 175 MCG tablet TAKE 1 TABLET BY MOUTH EVERY DAY BEFORE BREAKFAST Patient taking differently: Take 175 mcg by mouth daily before breakfast.  02/15/18  Yes Philemon Kingdom, MD  loratadine (CLARITIN) 10 MG tablet  Take 10 mg by mouth daily as needed for allergies (rash).    Yes [provider]  losartan (COZAAR) 100 MG tablet Take 100 mg by mouth daily.   Yes [provider]  Multiple Vitamin (MULTIVITAMIN WITH MINERALS) TABS tablet Take 1 tablet by mouth daily at 2 PM.    Yes [provider]  omeprazole (PRILOSEC) 20 MG capsule Take 20 mg by mouth daily at 2 PM.    Yes [provider]  polyvinyl alcohol (ARTIFICIAL TEARS) 1.4 % ophthalmic solution Place 1 drop into both eyes daily as needed for dry eyes.   Yes [provider]  potassium chloride (K-DUR) 10 MEQ tablet Take 1 tablet (10 mEq total) by mouth daily. Take extra tablet by mouth when taking extra furosemide Patient taking differently: Take 10 mEq by mouth See admin instructions. Take one tablet (10 meq) by mouth daily at 2pm, may take an extra tablet (10 meq) if taking an extra furosemide that day 01/06/17 07/08/18 Yes Isaiah Serge, NP  propranolol ER (INDERAL LA) 60 MG 24 hr capsule TAKE 2 CAPS IN THE MORNING TAKE 1 CAP IN THE AFTERNOON, 1 CAP AT NIGHT Patient taking differently: Take 120 mg by mouth 2 (two) times daily.  10/06/17  Yes Isaiah Serge, NP  Tiotropium Bromide Monohydrate (SPIRIVA RESPIMAT) 2.5 MCG/ACT AERS Inhale 2 puffs into the lungs daily. Patient taking differently: Inhale 2 puffs into the lungs at bedtime.  08/10/17  Yes Collene Gobble, MD    Family History Family History  Problem Relation Age of Onset  . Heart disease Mother   . Aortic stenosis Mother   . Heart disease Father   . Heart failure Father   . Heart attack Father     Social History Social History   Tobacco Use  . Smoking status: Former Smoker    Packs/day: 2.00    Years: 30.00    Pack years: 60.00    Types: Cigarettes    Last attempt to quit: 01/30/1992    Years since quitting: 26.2  . Smokeless tobacco: Never Used  Substance Use Topics  . Alcohol use: Yes    Alcohol/week: 1.0 standard drinks    Types: 1  Standard drinks or equivalent per week    Comment: wine occassionally  . Drug use: No     Allergies   Floxin [ofloxacin]   Review of Systems Review of Systems  Constitutional: Positive for activity change and fatigue. Negative for appetite change, chills and fever.  HENT: Negative for ear pain and sore throat.   Eyes: Negative for pain and visual disturbance.  Respiratory: Positive for cough, shortness of breath and wheezing.   Cardiovascular: Positive for chest pain. Negative for palpitations.  Gastrointestinal: Positive for abdominal pain. Negative for constipation, nausea and vomiting.  Genitourinary: Negative for dysuria and hematuria.  Musculoskeletal: Negative for arthralgias and back pain.  Skin: Negative for color change and rash.  Neurological: Negative for seizures and syncope.  All other systems reviewed and are negative.    Physical Exam Updated Vital Signs BP (!) 112/58   Pulse 80   Temp 98 F (36.7 C) (Oral)   Resp (!) 21   SpO2 93%   Physical Exam Vitals signs and nursing note reviewed.  Constitutional:      General: She is not in acute distress.    Appearance: She is well-developed. She is ill-appearing.  HENT:     Head: Normocephalic and atraumatic.  Eyes:     Conjunctiva/sclera: Conjunctivae normal.  Neck:     Musculoskeletal: Neck supple.  Cardiovascular:     Rate and Rhythm: Normal rate and regular rhythm.     Heart sounds: No murmur.  Pulmonary:     Effort: Tachypnea present.     Breath sounds: Examination of the right-middle field reveals rhonchi. Examination of the left-middle field reveals rhonchi. Examination of the right-lower field reveals decreased breath sounds. Examination of the left-lower field reveals decreased breath sounds. Decreased breath sounds and rhonchi present.  Abdominal:     Palpations: Abdomen is soft.     Tenderness: There is no abdominal tenderness.  Musculoskeletal:  Right lower leg: No edema.     Left lower  leg: No edema.  Skin:    General: Skin is warm and dry.     Capillary Refill: Capillary refill takes less than 2 seconds.  Neurological:     General: No focal deficit present.     Mental Status: She is alert and oriented to person, place, and time.     GCS: GCS eye subscore is 4. GCS verbal subscore is 5. GCS motor subscore is 6.  Psychiatric:        Behavior: Behavior is cooperative.      ED Treatments / Results  Labs (all labs ordered are listed, but only abnormal results are displayed) Labs Reviewed  CBC WITH DIFFERENTIAL/PLATELET - Abnormal; Notable for the following components:      Result Value   Hemoglobin 10.9 (*)    MCHC 29.5 (*)    nRBC 0.3 (*)    Lymphs Abs 0.6 (*)    All other components within normal limits  COMPREHENSIVE METABOLIC PANEL - Abnormal; Notable for the following components:   Glucose, Bld 115 (*)    BUN 25 (*)    Total Protein 8.5 (*)    Alkaline Phosphatase 25 (*)    Total Bilirubin 1.8 (*)    GFR calc non Af Amer 54 (*)    All other components within normal limits  BRAIN NATRIURETIC PEPTIDE - Abnormal; Notable for the following components:   B Natriuretic Peptide 548.5 (*)    All other components within normal limits  LIPASE, BLOOD  BASIC METABOLIC PANEL  CBC  I-STAT TROPONIN, ED    EKG EKG Interpretation  Date/Time:  Sunday May 09 2018 14:30:29 EST Ventricular Rate:  84 PR Interval:    QRS Duration: 84 QT Interval:  362 QTC Calculation: 427 R Axis:   -36 Text Interpretation:  Atrial fibrillation Left axis deviation Nonspecific ST and T wave abnormality Abnormal ECG No significant change since last tracing Confirmed by Lacretia Leigh ([id number]) on 05/09/2018 3:32:41 PM   Radiology Dg Chest 2 View  Result Date: 05/09/2018 CLINICAL DATA:  Shortness of breath, cough, hypoxia EXAM: CHEST - 2 VIEW COMPARISON:  CT chest dated 01/20/2018 FINDINGS: Cardiomegaly with pulmonary vascular congestion. No frank interstitial edema. Small right  pleural effusion. Associated small bilateral lower lobe opacities, likely atelectasis. Right lower lobe pneumonia is not excluded. No pneumothorax. IMPRESSION: Cardiomegaly pulmonary vascular congestion. No frank interstitial edema. Small right pleural effusion. Mild bilateral lower lobe opacities, likely atelectasis. Right lower lobe pneumonia is not excluded. Electronically Signed   By: Julian Hy M.D.   On: 05/09/2018 17:04    Procedures Procedures (including critical care time)  Medications Ordered in ED Medications  apixaban (ELIQUIS) tablet 5 mg (has no administration in time range)  atorvastatin (LIPITOR) tablet 20 mg (has no administration in time range)  fenofibrate tablet 160 mg (has no administration in time range)  ferrous sulfate tablet 325 mg (has no administration in time range)  fluticasone (FLONASE) 50 MCG/ACT nasal spray 2 spray (has no administration in time range)  guaiFENesin (MUCINEX) 12 hr tablet 600 mg (has no administration in time range)  levothyroxine (SYNTHROID, LEVOTHROID) tablet 175 mcg (has no administration in time range)  loratadine (CLARITIN) tablet 10 mg (has no administration in time range)  losartan (COZAAR) tablet 100 mg (has no administration in time range)  multivitamin with minerals tablet 1 tablet (has no administration in time range)  pantoprazole (PROTONIX) EC tablet 40 mg (has no  administration in time range)  Tiotropium Bromide Monohydrate AERS 2 puff (has no administration in time range)  propranolol ER (INDERAL LA) 24 hr capsule 120 mg (has no administration in time range)  furosemide (LASIX) injection 40 mg (has no administration in time range)  sodium chloride flush (NS) 0.9 % injection 3 mL (has no administration in time range)  sodium chloride flush (NS) 0.9 % injection 3 mL (has no administration in time range)  0.9 %  sodium chloride infusion (has no administration in time range)  acetaminophen (TYLENOL) tablet 650 mg (has no  administration in time range)    Or  acetaminophen (TYLENOL) suppository 650 mg (has no administration in time range)  ipratropium-albuterol (DUONEB) 0.5-2.5 (3) MG/3ML nebulizer solution 3 mL (3 mLs Nebulization Given 05/09/18 1619)  aspirin chewable tablet 324 mg (324 mg Oral Given 05/09/18 1619)     Initial Impression / Assessment and Plan / ED Course  I have reviewed the triage vital signs and the nursing notes.  Pertinent labs & imaging results that were available during my care of the patient were reviewed by me and considered in my medical decision making (see chart for details).     MDM:  Imaging: CXR shows cardiomegaly with pulmonary vascular congestion.  No frank interstitial edema.  Small right pleural effusion.  Mild bilateral lower lobe opacities likely atelectasis.   ED Provider Interpretation of EKG: A. fib with a rate of 84 bpm, left axis deviation, no ST segment elevation.  Subtle ST segment depression (less than 1 mm) and V2 through V6 as well as inverted T wave in lead V2 which appear to be new compared to prior EKGs.  Labs: BNP 548 (prior 298-471), lipase 46, CMP unremarkable CBC normal, trop 0.02  On initial evaluation, patient appears ill. Afebrile and hemodynamically stable and satting in the low to mid 90s on 2 L nasal cannula. Alert and oriented x4, pleasant, and cooperative.  Presents with acute on chronic dyspnea as detailed above.  On exam, patient has no significant evidence for systemic volume overload with no lower extremity edema or abdominal distention.  No JVD or JVP noted.  Decreased breath sounds, however, the bases bilaterally with faint rhonchi in the middle lung fields bilaterally.  Feels better on 2 L nasal cannula but has never been on oxygen in the past.  No change in her weight at home.  Chest x-ray today shows findings of CHF as above.  BNP noted above prior values as above.  EKG shows what appeared to be new subtle ST segment depression and T wave  changes although has no chest pain or pressure in the ED.  Given full dose aspirin.  Troponin negative.  Low suspicion for ACS.  No evidence for pericarditis or myocarditis.  No evidence for DVT on exam and compliant with her Eliquis for A. fib.  Low suspicion for PE. Given single duoneb with minimal change.   Patient given 20 mg of IV Lasix in the hospitalist in improved condition.  The plan for this patient was discussed with Dr. Zenia Resides who voiced agreement and who oversaw evaluation and treatment of this patient.   The patient was fully informed and involved with the history taking, evaluation, workup including labs/images, and plan. The patient's concerns and questions were addressed to the patient's satisfaction and she expressed agreement with the plan to admit.    Final Clinical Impressions(s) / ED Diagnoses   Final diagnoses:  Shortness of breath  Acute respiratory failure with  hypoxia ([place])  Acute on chronic diastolic congestive heart failure [facility])    ED Discharge Orders    None       Aubriel Khanna, Rodena Goldmann, MD 05/09/18 2059    Lacretia Leigh, MD 05/10/18 1330

## 2018-05-10 DIAGNOSIS — J9601 Acute respiratory failure with hypoxia: Secondary | ICD-10-CM

## 2018-05-10 DIAGNOSIS — E890 Postprocedural hypothyroidism: Secondary | ICD-10-CM

## 2018-05-10 DIAGNOSIS — Z6841 Body Mass Index (BMI) 40.0 and over, adult: Secondary | ICD-10-CM

## 2018-05-10 DIAGNOSIS — I4891 Unspecified atrial fibrillation: Secondary | ICD-10-CM

## 2018-05-10 LAB — BASIC METABOLIC PANEL
Anion gap: 9 (ref 5–15)
BUN: 25 mg/dL — ABNORMAL HIGH (ref 8–23)
CO2: 27 mmol/L (ref 22–32)
Calcium: 8.7 mg/dL — ABNORMAL LOW (ref 8.9–10.3)
Chloride: 103 mmol/L (ref 98–111)
Creatinine, Ser: 1.05 mg/dL — ABNORMAL HIGH (ref 0.44–1.00)
GFR calc Af Amer: 59 mL/min — ABNORMAL LOW (ref >60)
GFR calc non Af Amer: 51 mL/min — ABNORMAL LOW (ref >60)
Glucose, Bld: 95 mg/dL (ref 70–99)
Potassium: 3.4 mmol/L — ABNORMAL LOW (ref 3.5–5.1)
Sodium: 139 mmol/L (ref 135–145)

## 2018-05-10 LAB — CBC
HCT: 31.3 % — ABNORMAL LOW (ref 36.0–46.0)
Hemoglobin: 9.6 g/dL — ABNORMAL LOW (ref 12.0–15.0)
MCH: 28 pg (ref 26.0–34.0)
MCHC: 30.7 g/dL (ref 30.0–36.0)
MCV: 91.3 fL (ref 80.0–100.0)
Platelets: 245 10*3/uL (ref 150–400)
RBC: 3.43 MIL/uL — ABNORMAL LOW (ref 3.87–5.11)
RDW: 15.1 % (ref 11.5–15.5)
WBC: 3.9 10*3/uL — ABNORMAL LOW (ref 4.0–10.5)
nRBC: 0 % (ref 0.0–0.2)

## 2018-05-10 NOTE — Progress Notes (Signed)
Patient is alert up to [facility] with SOB 02 sat 92% 2L

## 2018-05-10 NOTE — Progress Notes (Signed)
Patient is alert and oreinted of hearing awake for report with slight resolving headache. On 02 plan for pt/ot walking stats.

## 2018-05-10 NOTE — Progress Notes (Signed)
PROGRESS NOTE    Kristin Hood   AYT:[id number]  DOB: [birth year]  DOA: 05/09/2018 PCP: [name], MD   Brief Narrative:  Kristin Hood   is a 78 y.o. female with medical history of diastolic CHF, A-fib, HTN, tremors who presents for shortness of breath and is found to be hypoxic and have pulmonary edema. She has been taking her Lasix regularly and actually took 2 today but was still short of breath. She has had more than usual sputum as well but this is clear. No fevers. She has no other complaints.    Subjective: Dyspnea has improved but not resolved.    Assessment & Plan:   Principal Problem:   Acute diastolic CHF (congestive heart failure) - continue to diurese with IV Lasix- follow I and O and daily weight- wean O2 as able  Active Problems:   Morbid obesity Body mass index is 36.16 kg/m.     Essential hypertension - cont Propranolol which she uses for A-fib and essential tremor -BP is low today- hold Losartan- holding Amlodipine already    Atrial fibrillation - cont Elliquis and Propranolol- rate controlled currently  Hypothyroidism  -cont synthriod   Non-Hodgkin's lymphoma - currently under surveillance only by Dr [name] - per patient, it has not "grown" in the past 2 yrs  H/o Breast cancer - in remision   Time spent in minutes: 35 DVT prophylaxis: Eliquis Code Status: do not intubate Family Communication:  Disposition Plan: home in 1- 2 days Consultants:   none Procedures:   none Antimicrobials:  Anti-infectives (From admission, onward)   None       Objective: Vitals:   05/10/18 0706 05/10/18 0727 05/10/18 1029 05/10/18 1229  BP: (!) 115/59  (!) 91/29 (!) 112/56  Pulse: 79  70 80  Resp: 20 (!) 22  19  Temp: 97.6 F (36.4 C)   98.9 F (37.2 C)  TempSrc: Oral   Oral  SpO2: 94%  94% 94%  Weight:      Height:        Intake/Output Summary (Last 24 hours) at 05/10/2018 1340 Last data filed at 05/10/2018 1000 Gross per 24 hour    Intake 360 ml  Output 2175 ml  Net -1815 ml   Filed Weights   05/09/18 2125 05/10/18 0500  Weight: 99.8 kg 98.6 kg    Examination: General exam: Appears comfortable  HEENT: PERRLA, oral mucosa moist, no sclera icterus or thrush Respiratory system: crackles in b/l Lower lung fields- on 2 L O2 Cardiovascular system: S1 & S2 heard, RRR.   Gastrointestinal system: Abdomen soft, non-tender, nondistended. Normal bowel sounds. Central nervous system: Alert and oriented. No focal neurological deficits. Extremities: No cyanosis, clubbing or edema Skin: No rashes or ulcers Psychiatry:  Mood & affect appropriate.     Data Reviewed: I have personally reviewed following labs and imaging studies  CBC: Recent Labs  Lab 05/09/18 1543 05/10/18 0523  WBC 6.3 3.9*  NEUTROABS 5.2  --   HGB 10.9* 9.6*  HCT 37.0 31.3*  MCV 92.5 91.3  PLT 277 355   Basic Metabolic Panel: Recent Labs  Lab 05/09/18 1543 05/10/18 0523  NA 139 139  K 4.2 3.4*  CL 102 103  CO2 27 27  GLUCOSE 115* 95  BUN 25* 25*  CREATININE 1.00 1.05*  CALCIUM 9.5 8.7*   GFR: Estimated Creatinine Clearance: 51.3 mL/min (A) (by C-G formula based on SCr of 1.05 mg/dL (H)). Liver Function Tests: Recent Labs  Lab  05/09/18 1543  AST 26  ALT 12  ALKPHOS 25*  BILITOT 1.8*  PROT 8.5*  ALBUMIN 3.5   Recent Labs  Lab 05/09/18 1543  LIPASE 46   No results for input(s): AMMONIA in the last 168 hours. Coagulation Profile: No results for input(s): INR, PROTIME in the last 168 hours. Cardiac Enzymes: No results for input(s): CKTOTAL, CKMB, CKMBINDEX, TROPONINI in the last 168 hours. BNP (last 3 results) No results for input(s): PROBNP in the last 8760 hours. HbA1C: No results for input(s): HGBA1C in the last 72 hours. CBG: No results for input(s): GLUCAP in the last 168 hours. Lipid Profile: No results for input(s): CHOL, HDL, LDLCALC, TRIG, CHOLHDL, LDLDIRECT in the last 72 hours. Thyroid Function Tests: No  results for input(s): TSH, T4TOTAL, FREET4, T3FREE, THYROIDAB in the last 72 hours. Anemia Panel: No results for input(s): VITAMINB12, FOLATE, FERRITIN, TIBC, IRON, RETICCTPCT in the last 72 hours. Urine analysis:    Component Value Date/Time   COLORURINE YELLOW 10/04/2014 1457   APPEARANCEUR CLOUDY (A) 10/04/2014 1457   LABSPEC 1.025 10/04/2014 1457   PHURINE 6.5 10/04/2014 1457   GLUCOSEU NEGATIVE 10/04/2014 1457   HGBUR NEGATIVE 10/04/2014 1457   [place] 10/04/2014 [place] 10/04/2014 1457   PROTEINUR NEGATIVE 10/04/2014 1457   UROBILINOGEN 1.0 10/04/2014 1457   NITRITE NEGATIVE 10/04/2014 1457   LEUKOCYTESUR MODERATE (A) 10/04/2014 1457   Sepsis Labs: @LABRCNTIP (procalcitonin:4,lacticidven:4) )No results found for this or any previous visit (from the past 240 hour(s)).       Radiology Studies: Dg Chest 2 View  Result Date: 05/09/2018 CLINICAL DATA:  Shortness of breath, cough, hypoxia EXAM: CHEST - 2 VIEW COMPARISON:  CT chest dated 01/20/2018 FINDINGS: Cardiomegaly with pulmonary vascular congestion. No frank interstitial edema. Small right pleural effusion. Associated small bilateral lower lobe opacities, likely atelectasis. Right lower lobe pneumonia is not excluded. No pneumothorax. IMPRESSION: Cardiomegaly pulmonary vascular congestion. No frank interstitial edema. Small right pleural effusion. Mild bilateral lower lobe opacities, likely atelectasis. Right lower lobe pneumonia is not excluded. Electronically Signed   By: Julian Hy M.D.   On: 05/09/2018 17:04      Scheduled Meds: . apixaban  5 mg Oral BID  . atorvastatin  20 mg Oral QHS  . fenofibrate  160 mg Oral QHS  . ferrous sulfate  325 mg Oral QHS  . furosemide  40 mg Intravenous BID  . guaiFENesin  600 mg Oral Daily  . levothyroxine  175 mcg Oral QAC breakfast  . losartan  100 mg Oral Daily  . multivitamin with minerals  1 tablet Oral Q1400  . pantoprazole  40 mg Oral  Daily  . propranolol ER  120 mg Oral BID  . sodium chloride flush  3 mL Intravenous Q12H  . umeclidinium bromide  1 puff Inhalation Daily   Continuous Infusions: . sodium chloride       LOS: 1 day      Debbe Odea, MD [facility] Pager: [email or web address] Password [email or web address] 05/10/2018, 1:40 PM

## 2018-05-11 LAB — BASIC METABOLIC PANEL
Anion gap: 12 (ref 5–15)
BUN: 29 mg/dL — AB (ref 8–23)
CO2: 30 mmol/L (ref 22–32)
CREATININE: 1.2 mg/dL — AB (ref 0.44–1.00)
Calcium: 9.1 mg/dL (ref 8.9–10.3)
Chloride: 96 mmol/L — ABNORMAL LOW (ref 98–111)
GFR calc Af Amer: 50 mL/min — ABNORMAL LOW (ref >60)
GFR calc non Af Amer: 43 mL/min — ABNORMAL LOW (ref >60)
GLUCOSE: 94 mg/dL (ref 70–99)
Potassium: 3.3 mmol/L — ABNORMAL LOW (ref 3.5–5.1)
Sodium: 138 mmol/L (ref 135–145)

## 2018-05-11 MED ORDER — MINERAL OIL PO OIL
TOPICAL_OIL | Freq: Every day | ORAL | Status: DC | PRN
  Administered 2018-05-11: 30 mL via ORAL
  Filled 2018-05-11 (×2): qty 30

## 2018-05-11 MED ORDER — POTASSIUM CHLORIDE CRYS ER 20 MEQ PO TBCR
40.0000 meq | EXTENDED_RELEASE_TABLET | ORAL | Status: AC
  Administered 2018-05-11 (×2): 40 meq via ORAL
  Filled 2018-05-11 (×2): qty 2

## 2018-05-11 NOTE — Progress Notes (Signed)
Physical Therapy Evaluation Patient Details Name: Kristin Hood MRN: [id number] DOB: 1940-06-01 Today's Date: 05/11/2018   History of Present Illness  Patient is 78 y/o female presenting to hospital with SOB and found to be hypoxic and have pulmonary edema. Patient also has acute dCHF. PHM includes HTN, afib, acute respiratory failure, COPD, tremors, non hodgkins lymphoma, hx of breast cancer, and hx of CVA.  Clinical Impression  Patient admitted to hospital secondary to problems above and with deficits below. Patient ambulated with supervision to min guard without AD. Patient with mild unsteadiness but no LOB noted. Oxygen saturations ranged from 90-92% on RA during functional mobility. Given functional mobility deficits and limited activity tolerance, recommending HHPT to regain baseline mobility. Educated patient about energy conservation techniques following discharge. Patient will benefit from acute physical therapy to maximize independence and safety with functional mobility.     Follow Up Recommendations Home health PT;Supervision for mobility/OOB    Equipment Recommendations  None recommended by PT    Recommendations for Other Services       Precautions / Restrictions Precautions Precautions: None Restrictions Weight Bearing Restrictions: No      Mobility  Bed Mobility Overal bed mobility: Needs Assistance Bed Mobility: Supine to Sit     Supine to sit: Supervision     General bed mobility comments: Patient required supervision for all bed mobility for safety.   Transfers Overall transfer level: Needs assistance Equipment used: None Transfers: Sit to/from Stand Sit to Stand: Supervision         General transfer comment: Patient required supervision to stand for safety without use of AD.   Ambulation/Gait Ambulation/Gait assistance: Supervision;Min guard Gait Distance (Feet): 150 Feet Assistive device: None Gait Pattern/deviations: Step-through  pattern;Decreased step length - right;Decreased step length - left;Decreased stride length;Drifts right/left Gait velocity: decreased Gait velocity interpretation: <1.8 ft/sec, indicate of risk for recurrent falls General Gait Details: Patient ambulated with supervision to min guard for safety without AD. Ambulated with slower waddle type gait with mild unsteadiness however no LOB noted. Patient with SOB requiring standing rest x1. Oxygen saturations ranging from 90-92% on RA during ambulation. Educated patient about energy conservation techniques at home.   Stairs            Wheelchair Mobility    Modified Rankin (Stroke Patients Only)       Balance Overall balance assessment: Needs assistance Sitting-balance support: Feet supported;No upper extremity supported Sitting balance-Leahy Scale: Good     Standing balance support: No upper extremity supported Standing balance-Leahy Scale: Fair Standing balance comment: Patient able to maintain static standing without LOB. Mild unsteadiness during gait but no LOB                              Pertinent Vitals/Pain Pain Assessment: Faces Faces Pain Scale: Hurts a little bit Pain Location: BLE Pain Descriptors / Indicators: Discomfort;Grimacing Pain Intervention(s): Limited activity within patient's tolerance;Monitored during session    Home Living Family/patient expects to be discharged to:: Private residence Living Arrangements: Children Available Help at Discharge: Family;Friend(s);Available 24 hours/day Type of Home: House Home Access: Stairs to enter;Ramped entrance Entrance Stairs-Rails: Right Entrance Stairs-Number of Steps: 5 Home Layout: One level Home Equipment: Walker - 2 wheels;Walker - 4 wheels;Cane - single point;Bedside commode;Shower seat Additional Comments: Patient reports she forces herself to complete stairs at home but also has a ramped enterance she can use. Ramp enterance is a further walk to  get into house    Prior Function Level of Independence: Independent               Hand Dominance        Extremity/Trunk Assessment   Upper Extremity Assessment Upper Extremity Assessment: Overall WFL for tasks assessed    Lower Extremity Assessment Lower Extremity Assessment: Generalized weakness;RLE deficits/detail;LLE deficits/detail RLE Deficits / Details: MMT grossly 4/5. Reports BLE pain following mobility  RLE Sensation: WNL LLE Deficits / Details: MMT grossly 4/5. Reports BLE pain following mobility LLE Sensation: WNL    Cervical / Trunk Assessment Cervical / Trunk Assessment: Normal  Communication   Communication: No difficulties  Cognition Arousal/Alertness: Awake/alert Behavior During Therapy: WFL for tasks assessed/performed Overall Cognitive Status: Within Functional Limits for tasks assessed                                        General Comments General comments (skin integrity, edema, etc.): Patient daughter in law in room during session. Oxygen saturations ranging from 90-92% on RA during functional mobility.    Exercises     Assessment/Plan    PT Assessment Patient needs continued PT services  PT Problem List Decreased strength;Decreased activity tolerance;Decreased balance;Decreased mobility;Decreased knowledge of use of DME;Cardiopulmonary status limiting activity       PT Treatment Interventions DME instruction;Gait training;Stair training;Functional mobility training;Therapeutic activities;Therapeutic exercise;Balance training;Patient/family education    PT Goals (Current goals can be found in the Care Plan section)  Acute Rehab PT Goals Patient Stated Goal: breathe better PT Goal Formulation: With patient Time For Goal Achievement: 05/25/18 Potential to Achieve Goals: Good    Frequency Min 3X/week   Barriers to discharge        Co-evaluation               AM-PAC PT "6 Clicks" Mobility  Outcome Measure  Help needed turning from your back to your side while in a flat bed without using bedrails?: None Help needed moving from lying on your back to sitting on the side of a flat bed without using bedrails?: None Help needed moving to and from a bed to a chair (including a wheelchair)?: None Help needed standing up from a chair using your arms (e.g., wheelchair or bedside chair)?: None Help needed to walk in hospital room?: A Little Help needed climbing 3-5 steps with a railing? : A Lot 6 Click Score: 21    End of Session Equipment Utilized During Treatment: Gait belt Activity Tolerance: Patient tolerated treatment well Patient left: in bed;with call bell/phone within reach;with family/visitor present(sitting EOB) Nurse Communication: Mobility status;Other (comment)(oxygen saturations) PT Visit Diagnosis: Unsteadiness on feet (R26.81);Other abnormalities of gait and mobility (R26.89)    Time: 5188-4166 PT Time Calculation (min) (ACUTE ONLY): 20 min   Charges:   PT Evaluation $PT Eval Low Complexity: 1 Low         Erick Blinks, SPT  Erick Blinks 05/11/2018, 1:21 PM

## 2018-05-11 NOTE — Progress Notes (Signed)
SATURATION QUALIFICATIONS: (This note is used to comply with regulatory documentation for home oxygen)  Patient Saturations on Room Air at Rest = 90%  Patient Saturations on Room Air while Ambulating = 90%   Please briefly explain why patient needs home oxygen: Patient oxygen saturations ranging from 90-92% on RA during ambulation. Patient does not need supplemental oxygen to maintain appropriate oxygen saturations at this time.  Erick Blinks, SPT

## 2018-05-11 NOTE — Progress Notes (Addendum)
Pt says she has intermittent pain in left chest, she says it last mintues to 30 min to hour, she reports Dr [name] is aware of it, usually occurs in afternoon when she is alone, and has been occurring over the past year 02 sat 89%, reapplied oxygen at 2l/, pt says she feels anxious when she gets this pain, " while I am here I wanted someone to know about it"

## 2018-05-11 NOTE — Consult Note (Signed)
   [facility] Inpatient Consult   05/11/2018  Kristin Hood 03/22/1941 [id number]  Patient was recently active with [place] team for medication assistance and management needs  for chronic disease management services. Patient with [employer] and her primary care provider is Dr. [name].  This office provides the transition of care follow up calls and appointments.  Chart review reveals on the day of admission per MD notes: Kristin Hood  is a 78 y.o.femalewith medical history ofdiastolic CHF, A-fib, HTN, tremors who presents for shortness of breath and is found to be hypoxic and have pulmonary edema. She has been taking her Lasix regularly and actually took 2 today but was still short of breath.   Met with the patient and visitor at the bedside. Patient expressed approval to speak about [facility] Care management and services. Patient states she is still in touch with the [facility] pharmacy staff.  Consent form signed.  Folder with [place] Management information given with contact information.  Encouraged to place the 24 hour nurse advise line on the refrigerator.    Patient will receive a post hospital call with EMMI and will be evaluated for assessments and disease process education.  Updated Inpatient Case Manager and made aware that [place] Management following in progression meeting.   Of note, [facility] Care Management services does not replace or interfere with any services that are needed or arranged by inpatient case management or social work.  For additional questions or referrals please contact:   Natividad Brood, RN BSN [facility] Liaison  [phone] business mobile phone Toll free office [phone]

## 2018-05-11 NOTE — Progress Notes (Addendum)
PROGRESS NOTE    Kristin Hood   XIH:[id number]  DOB: June 24, 1940  DOA: 05/09/2018 PCP: [name], MD   Brief Narrative:  Kristin Hood   is a 78 y.o. female with medical history of diastolic CHF, A-fib, HTN, tremors who presents for shortness of breath and is found to be hypoxic and have pulmonary edema. She has been taking her Lasix regularly and actually took 2 today but was still short of breath. She has had more than usual sputum as well but this is clear. No fevers. She has no other complaints.    Subjective: Dyspnea continues to improve. No new symptoms.     Assessment & Plan:   Principal Problem:   Acute diastolic CHF (congestive heart failure) - continue to diurese with IV Lasix- following I and O and daily weight- she is now below her dry weight but continues to have crackles in her lungs and an O2 requirements continue to diuresed with lasix- wean O2 as able- she notes that her baseline O2 requirement is about 95%  - assess room air O2 tomorrow  Active Problems: Hypokalemia - due to Lasix- continue to replace and recheck in AM    Morbid obesity Body mass index is 35.49 kg/m.     Essential hypertension - cont Propranolol which she uses for A-fib and essential tremor -BP has been low- holding Losartan & Amlodipine     Atrial fibrillation - cont Elliquis and Propranolol- rate controlled currently  Hypothyroidism  -cont synthriod   Non-Hodgkin's lymphoma - currently under surveillance only by Dr [name] - per patient, it has not "grown" in the past 2 yrs  H/o Breast cancer - in remision   Time spent in minutes: 35 DVT prophylaxis: Eliquis Code Status: do not intubate Family Communication:  Disposition Plan: home in 1- 2 days Consultants:   none Procedures:   none Antimicrobials:  Anti-infectives (From admission, onward)   None       Objective: Vitals:   05/11/18 0408 05/11/18 0800 05/11/18 1146 05/11/18 1251  BP: 125/68 117/66  107/75 126/66  Pulse: 75 77 64 79  Resp: 16 17 19 18   Temp: (!) 97.5 F (36.4 C) 98.2 F (36.8 C) 97.8 F (36.6 C) 98.3 F (36.8 C)  TempSrc: Oral Oral Oral Oral  SpO2: 94% 95% 92% 93%  Weight: 96.8 kg     Height:        Intake/Output Summary (Last 24 hours) at 05/11/2018 1340 Last data filed at 05/11/2018 1256 Gross per 24 hour  Intake 1083 ml  Output 4050 ml  Net -2967 ml   Filed Weights   05/09/18 2125 05/10/18 0500 05/11/18 0408  Weight: 99.8 kg 98.6 kg 96.8 kg    Examination: General exam: Appears comfortable  HEENT: PERRLA, oral mucosa moist, no sclera icterus or thrush Respiratory system:   Respiratory effort normal. Crackles in b/l lower lobes Cardiovascular system: S1 & S2 heard,  No murmurs  Gastrointestinal system: Abdomen soft, non-tender, nondistended. Normal bowel sound. No organomegaly Central nervous system: Alert and oriented. No focal neurological deficits. Extremities: No cyanosis, clubbing or edema Skin: No rashes or ulcers Psychiatry:  Mood & affect appropriate.     Data Reviewed: I have personally reviewed following labs and imaging studies  CBC: Recent Labs  Lab 05/09/18 1543 05/10/18 0523  WBC 6.3 3.9*  NEUTROABS 5.2  --   HGB 10.9* 9.6*  HCT 37.0 31.3*  MCV 92.5 91.3  PLT 277 349   Basic Metabolic Panel:  Recent Labs  Lab 05/09/18 1543 05/10/18 0523 05/11/18 0508  NA 139 139 138  K 4.2 3.4* 3.3*  CL 102 103 96*  CO2 27 27 30   GLUCOSE 115* 95 94  BUN 25* 25* 29*  CREATININE 1.00 1.05* 1.20*  CALCIUM 9.5 8.7* 9.1   GFR: Estimated Creatinine Clearance: 44.5 mL/min (A) (by C-G formula based on SCr of 1.2 mg/dL (H)). Liver Function Tests: Recent Labs  Lab 05/09/18 1543  AST 26  ALT 12  ALKPHOS 25*  BILITOT 1.8*  PROT 8.5*  ALBUMIN 3.5   Recent Labs  Lab 05/09/18 1543  LIPASE 46   No results for input(s): AMMONIA in the last 168 hours. Coagulation Profile: No results for input(s): INR, PROTIME in the last 168  hours. Cardiac Enzymes: No results for input(s): CKTOTAL, CKMB, CKMBINDEX, TROPONINI in the last 168 hours. BNP (last 3 results) No results for input(s): PROBNP in the last 8760 hours. HbA1C: No results for input(s): HGBA1C in the last 72 hours. CBG: No results for input(s): GLUCAP in the last 168 hours. Lipid Profile: No results for input(s): CHOL, HDL, LDLCALC, TRIG, CHOLHDL, LDLDIRECT in the last 72 hours. Thyroid Function Tests: No results for input(s): TSH, T4TOTAL, FREET4, T3FREE, THYROIDAB in the last 72 hours. Anemia Panel: No results for input(s): VITAMINB12, FOLATE, FERRITIN, TIBC, IRON, RETICCTPCT in the last 72 hours. Urine analysis:    Component Value Date/Time   COLORURINE YELLOW 10/04/2014 1457   APPEARANCEUR CLOUDY (A) 10/04/2014 1457   LABSPEC 1.025 10/04/2014 1457   PHURINE 6.5 10/04/2014 1457   GLUCOSEU NEGATIVE 10/04/2014 1457   HGBUR NEGATIVE 10/04/2014 1457   [place] 10/04/2014 [place] 10/04/2014 1457   PROTEINUR NEGATIVE 10/04/2014 1457   UROBILINOGEN 1.0 10/04/2014 1457   NITRITE NEGATIVE 10/04/2014 1457   LEUKOCYTESUR MODERATE (A) 10/04/2014 1457   Sepsis Labs: @LABRCNTIP (procalcitonin:4,lacticidven:4) )No results found for this or any previous visit (from the past 240 hour(s)).       Radiology Studies: Dg Chest 2 View  Result Date: 05/09/2018 CLINICAL DATA:  Shortness of breath, cough, hypoxia EXAM: CHEST - 2 VIEW COMPARISON:  CT chest dated 01/20/2018 FINDINGS: Cardiomegaly with pulmonary vascular congestion. No frank interstitial edema. Small right pleural effusion. Associated small bilateral lower lobe opacities, likely atelectasis. Right lower lobe pneumonia is not excluded. No pneumothorax. IMPRESSION: Cardiomegaly pulmonary vascular congestion. No frank interstitial edema. Small right pleural effusion. Mild bilateral lower lobe opacities, likely atelectasis. Right lower lobe pneumonia is not excluded.  Electronically Signed   By: Julian Hy M.D.   On: 05/09/2018 17:04      Scheduled Meds: . apixaban  5 mg Oral BID  . atorvastatin  20 mg Oral QHS  . fenofibrate  160 mg Oral QHS  . ferrous sulfate  325 mg Oral QHS  . furosemide  40 mg Intravenous BID  . guaiFENesin  600 mg Oral Daily  . levothyroxine  175 mcg Oral QAC breakfast  . multivitamin with minerals  1 tablet Oral Q1400  . pantoprazole  40 mg Oral Daily  . propranolol ER  120 mg Oral BID  . sodium chloride flush  3 mL Intravenous Q12H  . umeclidinium bromide  1 puff Inhalation Daily   Continuous Infusions: . sodium chloride       LOS: 2 days      Debbe Odea, MD [facility] Pager: [email or web address] Password [email or web address] 05/11/2018, 1:40 PM

## 2018-05-11 NOTE — Care Management Note (Signed)
Case Management Note  Patient Details  Name: Kristin Hood MRN: [id number] Date of Birth: 1940/05/08  Subjective/Objective: CHF                  Action/Plan: PCP: [name], MD; has private insurance with [employer]; CM following for progression of care.  Expected Discharge Date:   possibly 05/15/2018               Expected Discharge Plan:  Home/Self Care  Discharge planning Services  CM Consult  Status of Service:  In process, will continue to follow  Sherrilyn Rist [phone] 05/11/2018, 8:47 AM

## 2018-05-11 NOTE — Plan of Care (Signed)
?  Problem: Clinical Measurements: ?Goal: Ability to maintain clinical measurements within normal limits will improve ?Outcome: Progressing ?Goal: Will remain free from infection ?Outcome: Progressing ?Goal: Diagnostic test results will improve ?Outcome: Progressing ?  ?

## 2018-05-12 DIAGNOSIS — I10 Essential (primary) hypertension: Secondary | ICD-10-CM

## 2018-05-12 LAB — BASIC METABOLIC PANEL
Anion gap: 13 (ref 5–15)
BUN: 39 mg/dL — AB (ref 8–23)
CO2: 30 mmol/L (ref 22–32)
Calcium: 9.3 mg/dL (ref 8.9–10.3)
Chloride: 94 mmol/L — ABNORMAL LOW (ref 98–111)
Creatinine, Ser: 1.44 mg/dL — ABNORMAL HIGH (ref 0.44–1.00)
GFR calc Af Amer: 40 mL/min — ABNORMAL LOW (ref >60)
GFR calc non Af Amer: 35 mL/min — ABNORMAL LOW (ref >60)
Glucose, Bld: 98 mg/dL (ref 70–99)
Potassium: 3.6 mmol/L (ref 3.5–5.1)
Sodium: 137 mmol/L (ref 135–145)

## 2018-05-12 MED ORDER — POTASSIUM CHLORIDE CRYS ER 20 MEQ PO TBCR
40.0000 meq | EXTENDED_RELEASE_TABLET | Freq: Once | ORAL | Status: AC
  Administered 2018-05-12: 40 meq via ORAL
  Filled 2018-05-12: qty 2

## 2018-05-12 MED ORDER — FUROSEMIDE 10 MG/ML IJ SOLN
40.0000 mg | Freq: Every day | INTRAMUSCULAR | Status: DC
  Administered 2018-05-12: 40 mg via INTRAVENOUS

## 2018-05-12 MED ORDER — FUROSEMIDE 10 MG/ML IJ SOLN: 40.0000 mg | Freq: Every day | INTRAMUSCULAR | Status: DC

## 2018-05-12 NOTE — Care Management Important Message (Signed)
Important Message  Patient Details  Name: Kristin Hood MRN: [id number] Date of Birth: Oct 31, 1940   Medicare Important Message Given:  Yes    Barb Merino Jhovani Griswold 05/12/2018, 4:27 PM

## 2018-05-12 NOTE — Progress Notes (Signed)
PROGRESS NOTE    Kristin Hood  YKZ:[id number] DOB: 10-May-1940 DOA: 05/09/2018 PCP: [name], MD    Brief Narrative:  78 year old female who presented with dyspnea.  She does have significant past medical history for diastolic heart failure, atrial fibrillation, hypertension and tremors.  She reported progressively worsening dyspnea, refractive to increased dose of home diuretics.  On her initial physical examination blood pressure 122/70, heart rate 83, respiratory rate 18, oxygen saturation 91% on supplemental oxygen., temperature 98 F.  Heart with S1-S2 present, irregular irregular, no murmurs, lungs with rales bilaterally, abdomen soft nontender, no lower extremity edema.  Patient was admitted to the hospital working diagnosis of acute diastolic heart failure exacerbation complicated by acute hypoxic respiratory failure  Assessment & Plan:   Principal Problem:   Acute diastolic CHF (congestive heart failure) (HCC) Active Problems:   Morbid obesity with BMI of 45.0-49.9, adult ([place])   Morbid obesity ([place])   Essential hypertension   Atrial fibrillation (HCC)   Postsurgical hypothyroidism   Acute respiratory failure with hypoxemia (HCC)   Acute CHF (congestive heart failure) ([place])   1. Acute on chronic diastolic heart failure. Patient clinically improving, but not yet back to baseline, will continue diuresis with IV furosemide, urine output over last 24 H 3,250. Continue b blockade with propranolol.   2. Chronic atrial fibrillation. Continue rate control with propranolol per home regimen, continue anticoagulation with apixaban. Continue telemetry monitoring.   3. Hypothyroid. Continue levothyroxine.  4. Iron def anemia. Continue iron supplements.   5. AKI. Renal function with worsening cr, today up to 1,44 with K at 3,6 and serum bicarbonate at 30. Will decrease furosemide to daily and will follow on renal panel in am. Avoid hypotension and nephrotoxic agents. Systolic  blood pressure 939 to 106 mmHg.   DVT prophylaxis: apixaban   Code Status: full Family Communication: I spoke with patient's family at the bedside and all questions were addressed.  Disposition Plan/ discharge barriers: pending clinical improvement, possible dc in am.   Body mass index is 34.76 kg/m. Malnutrition Type:      Malnutrition Characteristics:      Nutrition Interventions:     RN Pressure Injury Documentation:     Consultants:     Procedures:     Antimicrobials:       Subjective: Patient is feeling better, but not yet back to baseline, dyspnea and lower extremity improving, no nausea or vomiting. No current chest pain, but had chest pain in the past.   Objective: Vitals:   05/11/18 2046 05/12/18 0543 05/12/18 0757 05/12/18 0858  BP: (!) 103/55 101/60  (!) 101/56  Pulse: 77 74  76  Resp:  20  18  Temp:  97.6 F (36.4 C)    TempSrc:  Oral    SpO2:  94% 91% 95%  Weight:  94.8 kg    Height:        Intake/Output Summary (Last 24 hours) at 05/12/2018 1056 Last data filed at 05/12/2018 0900 Gross per 24 hour  Intake 1123 ml  Output 2950 ml  Net -1827 ml   Filed Weights   05/10/18 0500 05/11/18 0408 05/12/18 0543  Weight: 98.6 kg 96.8 kg 94.8 kg    Examination:   General: Not in pain, positive dyspnea  Neurology: Awake and alert, non focal  E ENT: mild pallor, no icterus, oral mucosa moist Cardiovascular: No JVD. S1-S2 present, rhythmic, no gallops, rubs, or murmurs. +/++ bilatereal lower extremity edema. Pulmonary: positive breath sounds bilaterally, decreased  air movement, no wheezing, or rhonchi, scattered rales at bases. Gastrointestinal. Abdomen protuberant with no organomegaly, non tender, no rebound or guarding Skin. No rashes Musculoskeletal: no joint deformities     Data Reviewed: I have personally reviewed following labs and imaging studies  CBC: Recent Labs  Lab 05/09/18 1543 05/10/18 0523  WBC 6.3 3.9*  NEUTROABS  5.2  --   HGB 10.9* 9.6*  HCT 37.0 31.3*  MCV 92.5 91.3  PLT 277 633   Basic Metabolic Panel: Recent Labs  Lab 05/09/18 1543 05/10/18 0523 05/11/18 0508 05/12/18 0417  NA 139 139 138 137  K 4.2 3.4* 3.3* 3.6  CL 102 103 96* 94*  CO2 27 27 30 30   GLUCOSE 115* 95 94 98  BUN 25* 25* 29* 39*  CREATININE 1.00 1.05* 1.20* 1.44*  CALCIUM 9.5 8.7* 9.1 9.3   GFR: Estimated Creatinine Clearance: 36.6 mL/min (A) (by C-G formula based on SCr of 1.44 mg/dL (H)). Liver Function Tests: Recent Labs  Lab 05/09/18 1543  AST 26  ALT 12  ALKPHOS 25*  BILITOT 1.8*  PROT 8.5*  ALBUMIN 3.5   Recent Labs  Lab 05/09/18 1543  LIPASE 46   No results for input(s): AMMONIA in the last 168 hours. Coagulation Profile: No results for input(s): INR, PROTIME in the last 168 hours. Cardiac Enzymes: No results for input(s): CKTOTAL, CKMB, CKMBINDEX, TROPONINI in the last 168 hours. BNP (last 3 results) No results for input(s): PROBNP in the last 8760 hours. HbA1C: No results for input(s): HGBA1C in the last 72 hours. CBG: No results for input(s): GLUCAP in the last 168 hours. Lipid Profile: No results for input(s): CHOL, HDL, LDLCALC, TRIG, CHOLHDL, LDLDIRECT in the last 72 hours. Thyroid Function Tests: No results for input(s): TSH, T4TOTAL, FREET4, T3FREE, THYROIDAB in the last 72 hours. Anemia Panel: No results for input(s): VITAMINB12, FOLATE, FERRITIN, TIBC, IRON, RETICCTPCT in the last 72 hours.    Radiology Studies: I have reviewed all of the imaging during this hospital visit personally     Scheduled Meds: . apixaban  5 mg Oral BID  . atorvastatin  20 mg Oral QHS  . fenofibrate  160 mg Oral QHS  . ferrous sulfate  325 mg Oral QHS  . furosemide  40 mg Intravenous BID  . guaiFENesin  600 mg Oral Daily  . levothyroxine  175 mcg Oral QAC breakfast  . multivitamin with minerals  1 tablet Oral Q1400  . pantoprazole  40 mg Oral Daily  . propranolol ER  120 mg Oral BID  .  sodium chloride flush  3 mL Intravenous Q12H  . umeclidinium bromide  1 puff Inhalation Daily   Continuous Infusions: . sodium chloride       LOS: 3 days        Mauricio Gerome Apley, MD

## 2018-05-12 NOTE — Progress Notes (Signed)
SATURATION QUALIFICATIONS: (This note is used to comply with regulatory documentation for home oxygen)  Patient Saturations on Room Air at Rest = 99 %  Patient Saturations on Room Air while Ambulating = 94 %   Please briefly explain why patient needs home oxygen:pt walked 1/2 hallway, no sob, ra sat 93-94%, does not qualify for home o2

## 2018-05-12 NOTE — Care Management Note (Signed)
Case Management Note  Patient Details  Name: Kristin Hood MRN: [id number] Date of Birth: 22-Mar-1941  Subjective/Objective:     CHF              Action/Plan: Patient lives at home with her daughter; PCP: [name], MD; has private insurance with [employer] with prescription drug coverage; pharmacy of choice is [facility] on [address]; DME - walker, cane, lift chair; Walker choice offered, pt refused, she stated that she has 2 dogs and cats that keep her active; CM informed her that if she changed her mind after discharge and wanted [place] her PCP can make the arrangements from the office. CM will continue to follow for progression of care.  Expected Discharge Date:   possibly 2/114/2020               Expected Discharge Plan:  Home/Self Care  In-House Referral:   [facility]  Discharge planning Services  CM Consult  Choice offered to:  Patient  HH Arranged:  Patient Refused HH  Status of Service:  In process, will continue to follow  Sherrilyn Rist [phone] 05/12/2018, 10:47 AM

## 2018-05-12 NOTE — Progress Notes (Signed)
Pt + bm after mineral oil last night, denies dizziness, bp soft 101/56, paged Dr Cathlean Sauer re: bp and lasix and inderal as inderal was held last night

## 2018-05-13 LAB — BASIC METABOLIC PANEL
Anion gap: 10 (ref 5–15)
BUN: 44 mg/dL — ABNORMAL HIGH (ref 8–23)
CO2: 29 mmol/L (ref 22–32)
Calcium: 9.1 mg/dL (ref 8.9–10.3)
Chloride: 98 mmol/L (ref 98–111)
Creatinine, Ser: 1.47 mg/dL — ABNORMAL HIGH (ref 0.44–1.00)
GFR calc Af Amer: 39 mL/min — ABNORMAL LOW (ref >60)
GFR calc non Af Amer: 34 mL/min — ABNORMAL LOW (ref >60)
Glucose, Bld: 108 mg/dL — ABNORMAL HIGH (ref 70–99)
Potassium: 4 mmol/L (ref 3.5–5.1)
Sodium: 137 mmol/L (ref 135–145)

## 2018-05-13 NOTE — Progress Notes (Signed)
Physical Therapy Treatment & Discharge Patient Details Name: Kristin Hood MRN: [id number] DOB: [birth year] Today's Date: 05/13/2018    History of Present Illness Patient is 78 y/o female presenting 05/09/18 with SOB. Found to be hypoxic with pulmonary edema; worked up for acute on chronic CHF. PHM includes HTN, afib, COPD, tremors, non hodgkins lymphoma, breast cancer, CVA.   PT Comments    Pt progressing well with mobility. Ambulating without DME at supervision-level. DOE 2/4, SpO2 >90% on RA with ambulation. Pt will have 24/7 support available at home. Educ on fall risk reduction and energy conservations strategies. Pt has no further questions or concerns. Will d/c acute PT.   Follow Up Recommendations  No PT follow up;Supervision for mobility/OOB     Equipment Recommendations  None recommended by PT    Recommendations for Other Services       Precautions / Restrictions Precautions Precautions: None Restrictions Weight Bearing Restrictions: No    Mobility  Bed Mobility               General bed mobility comments: Received sitting in chair  Transfers Overall transfer level: Independent Equipment used: None Transfers: Sit to/from Stand              Ambulation/Gait Ambulation/Gait assistance: Supervision Gait Distance (Feet): 200 Feet Assistive device: None Gait Pattern/deviations: Step-through pattern;Decreased stride length;Drifts right/left Gait velocity: Decreased Gait velocity interpretation: 1.31 - 2.62 ft/sec, indicative of limited community ambulator General Gait Details: Slow, waddling gait, mostly steady with supervision for safety; no overt LOB. SpO2 >90% on RA. Pt reports slight instability due to bilateral foot OA and toe deformities   Stairs             Wheelchair Mobility    Modified Rankin (Stroke Patients Only)       Balance   Sitting-balance support: Feet supported;No upper extremity supported Sitting balance-Leahy Scale:  Good     Standing balance support: No upper extremity supported Standing balance-Leahy Scale: Good                              Cognition Arousal/Alertness: Awake/alert Behavior During Therapy: WFL for tasks assessed/performed Overall Cognitive Status: Within Functional Limits for tasks assessed                                        Exercises      General Comments General comments (skin integrity, edema, etc.): Pt considered life alert button when living alone, but not she lives with family who is around 24/7. Educ on fall risk reduction strategies, especially since pt caring for a new puppy at home      Pertinent Vitals/Pain Pain Assessment: No/denies pain    Home Living                      Prior Function            PT Goals (current goals can now be found in the care plan section) Acute Rehab PT Goals Patient Stated Goal: breathe better PT Goal Formulation: With patient Time For Goal Achievement: 05/25/18 Potential to Achieve Goals: Good Progress towards PT goals: Goals met/education completed, patient discharged from PT    Frequency    Min 3X/week      PT Plan Current plan remains appropriate    Co-evaluation  AM-PAC PT "6 Clicks" Mobility   Outcome Measure  Help needed turning from your back to your side while in a flat bed without using bedrails?: None Help needed moving from lying on your back to sitting on the side of a flat bed without using bedrails?: None Help needed moving to and from a bed to a chair (including a wheelchair)?: None Help needed standing up from a chair using your arms (e.g., wheelchair or bedside chair)?: None Help needed to walk in hospital room?: A Little Help needed climbing 3-5 steps with a railing? : A Little 6 Click Score: 22    End of Session Equipment Utilized During Treatment: Gait belt Activity Tolerance: Patient tolerated treatment well Patient left: in  chair;with call bell/phone within reach;with nursing/sitter in room Nurse Communication: Mobility status PT Visit Diagnosis: Unsteadiness on feet (R26.81);Other abnormalities of gait and mobility (R26.89)     Time: 4417-1278 PT Time Calculation (min) (ACUTE ONLY): 13 min  Charges:  $Gait Training: 8-22 mins                    Mabeline Caras, PT, DPT Acute Rehabilitation Services  Pager [phone] Office [place] 05/13/2018, 8:42 AM

## 2018-05-13 NOTE — Progress Notes (Addendum)
PROGRESS NOTE    Kristin Hood  IDP:[id number] DOB: [birth year] DOA: 05/09/2018 PCP: [name], MD    Brief Narrative:  78 year old female who presented with dyspnea.  She does have significant past medical history for diastolic heart failure, atrial fibrillation, hypertension and tremors.  She reported progressively worsening dyspnea, refractive to increased dose of home diuretics.  On her initial physical examination blood pressure 122/70, heart rate 83, respiratory rate 18, oxygen saturation 91% on supplemental oxygen., temperature 98 F.  Heart with S1-S2 present, irregular irregular, no murmurs, lungs with rales bilaterally, abdomen soft nontender, no lower extremity edema.  Patient was admitted to the hospital working diagnosis of acute diastolic heart failure exacerbation complicated by acute hypoxic respiratory failure   Assessment & Plan:   Principal Problem:   Acute diastolic CHF (congestive heart failure) (HCC) Active Problems:   Morbid obesity with BMI of 45.0-49.9, adult ([place])   Morbid obesity ([place])   Essential hypertension   Atrial fibrillation (HCC)   Postsurgical hypothyroidism   Acute respiratory failure with hypoxemia (HCC)   Acute CHF (congestive heart failure) ([place])   1. Acute on chronic diastolic heart failure. Urine output over last 24 H 1,325 ml, patient with improvement of her dyspnea and lower extremity edema, no current chest pain. Will hold on furosemide for today due to worsening renal function. Continue heart failure management with propranolol. Heart failure teaching/ nutrition.   2. Chronic atrial fibrillation. On propranolol for rate control, continue anticoagulation with apixaban. HR in  80 range.   3. Hypothyroid. On levothyroxine with good toleration.   4. Iron def anemia. On iron supplements.   5. AKI. Worsening renal function with serum cr at 1.47 from 1,44, serum K at 4,0 and serum bicarbonate at 29, possible over diuresis, will hold  on furosemide for now and will follow on renal panel in am, avoid hypotension and nephrotoxic medications.  6. Dyslipidemia. Continue atorvastatin and fenofibrate.   7. Morbid obesity. Calculated BMI 34,6, will need outpatient follow up, follow with nutrition recommendations.   DVT prophylaxis: apixaban   Code Status: full Family Communication: I spoke with patient's family at the bedside and all questions were addressed.   Disposition Plan/ discharge barriers: possible dc in am, if improves renal function.   Body mass index is 34.61 kg/m. Malnutrition Type:      Malnutrition Characteristics:      Nutrition Interventions:     RN Pressure Injury Documentation:     Consultants:     Procedures:     Antimicrobials:       Subjective: Patient is feeling better, her dyspnea and lower extremity edema have improved, but had worsening renal function, no nausea or vomiting, no chest pain.   Objective: Vitals:   05/12/18 1334 05/12/18 1929 05/13/18 0457 05/13/18 0800  BP: (!) 106/59 (!) 103/50 105/72 101/67  Pulse: 74 84 79 79  Resp:  18 18 20   Temp:  98.1 F (36.7 C) 98.2 F (36.8 C) 98.4 F (36.9 C)  TempSrc:  Oral Oral Oral  SpO2: 96% 92% 91% 96%  Weight:   94.3 kg   Height:        Intake/Output Summary (Last 24 hours) at 05/13/2018 0940 Last data filed at 05/13/2018 0847 Gross per 24 hour  Intake 483 ml  Output 1725 ml  Net -1242 ml   Filed Weights   05/11/18 0408 05/12/18 0543 05/13/18 0457  Weight: 96.8 kg 94.8 kg 94.3 kg    Examination:   General:  deconditioned  Neurology: Awake and alert, non focal  E ENT: mild pallor, no icterus, oral mucosa moist Cardiovascular: No JVD. S1-S2 present, rhythmic, no gallops, rubs, or murmurs. Trace non pitting lower extremity edema. Pulmonary: positive breath sounds bilaterally, adequate air movement, no wheezing, rhonchi or rales. Gastrointestinal. Abdomen with no organomegaly, non tender, no rebound or  guarding Skin. No rashes Musculoskeletal: no joint deformities     Data Reviewed: I have personally reviewed following labs and imaging studies  CBC: Recent Labs  Lab 05/09/18 1543 05/10/18 0523  WBC 6.3 3.9*  NEUTROABS 5.2  --   HGB 10.9* 9.6*  HCT 37.0 31.3*  MCV 92.5 91.3  PLT 277 867   Basic Metabolic Panel: Recent Labs  Lab 05/09/18 1543 05/10/18 0523 05/11/18 0508 05/12/18 0417 05/13/18 0545  NA 139 139 138 137 137  K 4.2 3.4* 3.3* 3.6 4.0  CL 102 103 96* 94* 98  CO2 27 27 30 30 29   GLUCOSE 115* 95 94 98 108*  BUN 25* 25* 29* 39* 44*  CREATININE 1.00 1.05* 1.20* 1.44* 1.47*  CALCIUM 9.5 8.7* 9.1 9.3 9.1   GFR: Estimated Creatinine Clearance: 35.8 mL/min (A) (by C-G formula based on SCr of 1.47 mg/dL (H)). Liver Function Tests: Recent Labs  Lab 05/09/18 1543  AST 26  ALT 12  ALKPHOS 25*  BILITOT 1.8*  PROT 8.5*  ALBUMIN 3.5   Recent Labs  Lab 05/09/18 1543  LIPASE 46   No results for input(s): AMMONIA in the last 168 hours. Coagulation Profile: No results for input(s): INR, PROTIME in the last 168 hours. Cardiac Enzymes: No results for input(s): CKTOTAL, CKMB, CKMBINDEX, TROPONINI in the last 168 hours. BNP (last 3 results) No results for input(s): PROBNP in the last 8760 hours. HbA1C: No results for input(s): HGBA1C in the last 72 hours. CBG: No results for input(s): GLUCAP in the last 168 hours. Lipid Profile: No results for input(s): CHOL, HDL, LDLCALC, TRIG, CHOLHDL, LDLDIRECT in the last 72 hours. Thyroid Function Tests: No results for input(s): TSH, T4TOTAL, FREET4, T3FREE, THYROIDAB in the last 72 hours. Anemia Panel: No results for input(s): VITAMINB12, FOLATE, FERRITIN, TIBC, IRON, RETICCTPCT in the last 72 hours.    Radiology Studies: I have reviewed all of the imaging during this hospital visit personally     Scheduled Meds: . apixaban  5 mg Oral BID  . atorvastatin  20 mg Oral QHS  . fenofibrate  160 mg Oral QHS  .  ferrous sulfate  325 mg Oral QHS  . guaiFENesin  600 mg Oral Daily  . levothyroxine  175 mcg Oral QAC breakfast  . multivitamin with minerals  1 tablet Oral Q1400  . pantoprazole  40 mg Oral Daily  . propranolol ER  120 mg Oral BID  . sodium chloride flush  3 mL Intravenous Q12H  . umeclidinium bromide  1 puff Inhalation Daily   Continuous Infusions: . sodium chloride       LOS: 4 days        Destry Dauber Gerome Apley, MD

## 2018-05-14 LAB — CBC
HCT: 34.4 % — ABNORMAL LOW (ref 36.0–46.0)
Hemoglobin: 10.4 g/dL — ABNORMAL LOW (ref 12.0–15.0)
MCH: 27.2 pg (ref 26.0–34.0)
MCHC: 30.2 g/dL (ref 30.0–36.0)
MCV: 89.8 fL (ref 80.0–100.0)
PLATELETS: 267 10*3/uL (ref 150–400)
RBC: 3.83 MIL/uL — ABNORMAL LOW (ref 3.87–5.11)
RDW: 14.9 % (ref 11.5–15.5)
WBC: 3.8 10*3/uL — ABNORMAL LOW (ref 4.0–10.5)
nRBC: 0 % (ref 0.0–0.2)

## 2018-05-14 LAB — BASIC METABOLIC PANEL
Anion gap: 7 (ref 5–15)
BUN: 45 mg/dL — ABNORMAL HIGH (ref 8–23)
CO2: 27 mmol/L (ref 22–32)
CREATININE: 1.34 mg/dL — AB (ref 0.44–1.00)
Calcium: 9 mg/dL (ref 8.9–10.3)
Chloride: 101 mmol/L (ref 98–111)
GFR calc Af Amer: 44 mL/min — ABNORMAL LOW (ref >60)
GFR calc non Af Amer: 38 mL/min — ABNORMAL LOW (ref >60)
Glucose, Bld: 99 mg/dL (ref 70–99)
Potassium: 3.7 mmol/L (ref 3.5–5.1)
Sodium: 135 mmol/L (ref 135–145)

## 2018-05-14 MED ORDER — FUROSEMIDE 40 MG PO TABS: 40.0000 mg | ORAL_TABLET | Freq: Every day | ORAL | 0 refills | Status: DC

## 2018-05-14 MED ORDER — FUROSEMIDE 40 MG PO TABS
40.0000 mg | ORAL_TABLET | Freq: Every day | ORAL | Status: DC
  Administered 2018-05-14: 40 mg via ORAL
  Filled 2018-05-14: qty 1

## 2018-05-14 NOTE — Plan of Care (Signed)
Nutrition Education Note  RD consulted for nutrition education regarding CHF.  RD provided "Heart Failure Nutrition Therapy" handout from the [employer]. Reviewed patient's dietary recall. Pt reports her daughter's significant other cooks for her and he uses spices/herbs instead of salt during cooking. Pt does not use salt shaker. Pt is very familiar with the food label and knows what to look for. Biggest source of sodium in patient's diet appears to be eating out. Provided examples on ways to decrease sodium intake in diet. Discouraged intake of processed foods and use of salt shaker (pt does not use). Encouraged fresh fruits and vegetables as well as whole grain sources of carbohydrates to maximize fiber intake. Pt reports she is/was a participant in the [place] since the 90s and has been educated many times on heart healthy, low sodium diet.   RD discussed why it is important for patient to adhere to diet recommendations, and emphasized the role of fluids, foods to avoid, and importance of weighing self daily. Pt already weighs herself daily at the same time each day (in AM after waking). Teach back method used.  Expect good compliance.  Body mass index is 34.61 kg/m. Pt meets criteria for obesity unspecified based on current BMI.  Pt reports good appetite currently and PTA. Pt being discharged home today. No further interventions at this time.   Kerman Passey MS, RD, [place] , [place] [phone] Pager  [phone] Weekend/On-Call Pager

## 2018-05-14 NOTE — Discharge Summary (Signed)
Physician Discharge Summary  Kristin Hood SWF:[id number] DOB: Jan 07, 1941 DOA: 05/09/2018  PCP: [name], MD  Admit date: 05/09/2018 Discharge date: 05/14/2018  Admitted From: Home  Disposition:  Home   Recommendations for Outpatient Follow-up and new medication changes:  1. Follow up with Dr. Rex Kras in 7 days.  2. Patient has been placed on 40 mg of furosemide daily.  3. Continue to weight daily and take an extra furosemide tablet in case of weight gain 3 lb in 24 hours or 5 lbs in 7 days.  4. Please follow renal function in 7 days.   Home Health: no   Equipment/Devices: no    Discharge Condition: stable  CODE STATUS: partial code   Diet recommendation: Heart healthy   Brief/Interim Summary: 78 year old female who presented with dyspnea. She does have significant past medical history for diastolic heart failure, atrial fibrillation, hypertension and morbid obesity. She reported progressively worsening dyspnea, refractive to increased dose of home diuretics. On her initial physical examination blood pressure 122/70, heart rate 83, respiratory rate 18, oxygen saturation 91% on supplemental oxygen., temperature 98 F.Heart with S1-S2present, irregular irregular, no murmurs, rubs or gallops, lungs with rales bilaterally, abdomen soft nontender, no lower extremity edema.  Sodium 139, potassium 4.2, chloride 102, bicarb 27, glucose 115, BUN 25, creatinine 1.0, BNP 548.5, white count 6.3, hemoglobin 10.9, hematocrit 37.0, platelets 277.  Her chest x-ray showed cardiomegaly with increased interstitial markings bilaterally.  Her EKG had 84 bpm, atrial fibrillation rhythm, left axis deviation.  Patient was admitted to the hospital with the working diagnosis of acute diastolic heart failure exacerbation complicated by acute hypoxic respiratory failure.   1.  Acute on chronic diastolic heart failure exacerbation, complicated by acute hypoxic respiratory failure (left ventricle ejection  fraction 55 to 60%, 2018).  Patient was admitted to the telemetry unit, she received aggressive diuresis with IV furosemide and  negative fluid balance was achieved (-8,354 ml and 5 kg weight loss since admission), with significant improvement of her symptoms.  Patient will continue diuresis with oral furosemide 40 mg daily, instructed to take extra tablet of furosemide in case of 3 lbs weight gain in 24 hours for 5 lbs weight gain in 7 days.  Continue heart failure management with propanolol and losartan.  Patient responded well to diuresis with improvement of her oxygenation, discharge oximetry is 92% on room air.   2.  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  Patient developed worsening kidney function, peak creatinine 1.47, likely due to overdiuresis.  Diuretic regimen was modified, discharge creatinine is down to 1.34, with potassium of 3.7 and serum bicarbonate of 27. It is recommended to follow renal function as outpatient in 7 days.   3.  Chronic atrial fibrillation.  Patient remained on a telemetry monitoring, her rate remained well controlled with propanolol, anticoagulation with apixaban.  4.  Hypothyroidism.  Continue levothyroxine.  5.  Dyslipidemia.  Patient will continue atorvastatin and fenofibrate.  6.  Iron deficiency anemia.  Hemoglobin and hematocrit remained stable, follow-up as an outpatient.  Continue iron supplements.  7.  Morbid obesity.  Calculated BMI 34.6, will need close follow-up as an outpatient.  8.  Hypertension.  Losartan was held during her hospitalization, to prevent hypotension, it will be resumed at discharge.  Discharge Diagnoses:  Principal Problem:   Acute diastolic CHF (congestive heart failure) (HCC) Active Problems:   Morbid obesity with BMI of 45.0-49.9, adult ([place])   Morbid obesity ([place])   Essential hypertension   Atrial  fibrillation ([place])   Postsurgical hypothyroidism   Acute respiratory failure with hypoxemia (HCC)   Acute CHF (congestive heart  failure) ([place])    Discharge Instructions   Allergies as of 05/14/2018      Reactions   Floxin [ofloxacin] Nausea Only, Other (See Comments)   Dizziness, Vertigo      Medication List    STOP taking these medications   amoxicillin 500 MG capsule Commonly known as:  AMOXIL     TAKE these medications   albuterol 108 (90 Base) MCG/ACT inhaler Commonly known as:  PROVENTIL HFA;VENTOLIN HFA Inhale 2 puffs into the lungs every 4 (four) hours as needed for wheezing or shortness of breath.   amLODipine 5 MG tablet Commonly known as:  NORVASC Take 5 mg by mouth daily.   apixaban 5 MG Tabs tablet Commonly known as:  ELIQUIS Take 1 tablet (5 mg total) by mouth 2 (two) times daily.   ARTIFICIAL TEARS 1.4 % ophthalmic solution Generic drug:  polyvinyl alcohol Place 1 drop into both eyes daily as needed for dry eyes.   atorvastatin 20 MG tablet Commonly known as:  LIPITOR Take 20 mg by mouth at bedtime.   fenofibrate 160 MG tablet Take 160 mg by mouth at bedtime.   ferrous sulfate 325 (65 FE) MG EC tablet Take 325 mg by mouth at bedtime.   fluticasone 50 MCG/ACT nasal spray Commonly known as:  FLONASE Place 2 sprays into both nostrils daily as needed for allergies or rhinitis.   furosemide 40 MG tablet Commonly known as:  LASIX Take 1 tablet (40 mg total) by mouth daily for 30 days. Start taking on:  May 15, 2018 What changed:    medication strength  how much to take  additional instructions   guaiFENesin 600 MG 12 hr tablet Commonly known as:  MUCINEX Take 600 mg by mouth daily.   ketoconazole 2 % cream Commonly known as:  NIZORAL Apply 1 application topically daily as needed for irritation (rash).   levothyroxine 175 MCG tablet Commonly known as:  SYNTHROID, LEVOTHROID TAKE 1 TABLET BY MOUTH EVERY DAY BEFORE BREAKFAST What changed:    how much to take  how to take this  when to take this  additional instructions   loratadine 10 MG  tablet Commonly known as:  CLARITIN Take 10 mg by mouth daily as needed for allergies (rash).   losartan 100 MG tablet Commonly known as:  COZAAR Take 100 mg by mouth daily.   multivitamin with minerals Tabs tablet Take 1 tablet by mouth daily at 2 PM.   omeprazole 20 MG capsule Commonly known as:  PRILOSEC Take 20 mg by mouth daily at 2 PM.   potassium chloride 10 MEQ tablet Commonly known as:  K-DUR Take 1 tablet (10 mEq total) by mouth daily. Take extra tablet by mouth when taking extra furosemide What changed:    when to take this  additional instructions   propranolol ER 60 MG 24 hr capsule Commonly known as:  INDERAL LA TAKE 2 CAPS IN THE MORNING TAKE 1 CAP IN THE AFTERNOON, 1 CAP AT NIGHT What changed:  See the new instructions.   Tiotropium Bromide Monohydrate 2.5 MCG/ACT Aers Commonly known as:  SPIRIVA RESPIMAT Inhale 2 puffs into the lungs daily. What changed:  when to take this       Allergies  Allergen Reactions  . Floxin [Ofloxacin] Nausea Only and Other (See Comments)    Dizziness, Vertigo     Consultations:  Procedures/Studies: Dg Chest 2 View  Result Date: 05/09/2018 CLINICAL DATA:  Shortness of breath, cough, hypoxia EXAM: CHEST - 2 VIEW COMPARISON:  CT chest dated 01/20/2018 FINDINGS: Cardiomegaly with pulmonary vascular congestion. No frank interstitial edema. Small right pleural effusion. Associated small bilateral lower lobe opacities, likely atelectasis. Right lower lobe pneumonia is not excluded. No pneumothorax. IMPRESSION: Cardiomegaly pulmonary vascular congestion. No frank interstitial edema. Small right pleural effusion. Mild bilateral lower lobe opacities, likely atelectasis. Right lower lobe pneumonia is not excluded. Electronically Signed   By: Julian Hy M.D.   On: 05/09/2018 17:04       Subjective: Patient is feeling better, back to her baseline, no dyspnea or chest pain, no nausea or vomiting, no chest pain. Edema  has improved.   Discharge Exam: Vitals:   05/14/18 0624 05/14/18 0830  BP: (!) 103/59   Pulse: 71   Resp: 18   Temp: (!) 97.4 F (36.3 C)   SpO2: 91% 92%   Vitals:   05/13/18 1112 05/13/18 1952 05/14/18 0624 05/14/18 0830  BP: 114/68 (!) 110/52 (!) 103/59   Pulse: 81 79 71   Resp: 18 18 18    Temp:  98.3 F (36.8 C) (!) 97.4 F (36.3 C)   TempSrc:  Oral Oral   SpO2: 98% 91% 91% 92%  Weight:   94.3 kg   Height:        General: Not in pain or dyspnea Neurology: Awake and alert, non focal  E ENT: no pallor, no icterus, oral mucosa moist Cardiovascular: No JVD. S1-S2 present, rhythmic, no gallops, or rubs, positive 2/3 systolic murmur at the alex. No lower extremity edema. Pulmonary: positive breath sounds bilaterally, adequate air movement, no wheezing, rhonchi or rales. Gastrointestinal. Abdomen protuberant with no organomegaly, non tender, no rebound or guarding Skin. No rashes Musculoskeletal: no joint deformities   The results of significant diagnostics from this hospitalization (including imaging, microbiology, ancillary and laboratory) are listed below for reference.     Microbiology: No results found for this or any previous visit (from the past 240 hour(s)).   Labs: BNP (last 3 results) Recent Labs    05/09/18 1543  BNP 244.0*   Basic Metabolic Panel: Recent Labs  Lab 05/10/18 0523 05/11/18 0508 05/12/18 0417 05/13/18 0545 05/14/18 0238  NA 139 138 137 137 135  K 3.4* 3.3* 3.6 4.0 3.7  CL 103 96* 94* 98 101  CO2 27 30 30 29 27   GLUCOSE 95 94 98 108* 99  BUN 25* 29* 39* 44* 45*  CREATININE 1.05* 1.20* 1.44* 1.47* 1.34*  CALCIUM 8.7* 9.1 9.3 9.1 9.0   Liver Function Tests: Recent Labs  Lab 05/09/18 1543  AST 26  ALT 12  ALKPHOS 25*  BILITOT 1.8*  PROT 8.5*  ALBUMIN 3.5   Recent Labs  Lab 05/09/18 1543  LIPASE 46   No results for input(s): AMMONIA in the last 168 hours. CBC: Recent Labs  Lab 05/09/18 1543 05/10/18 0523  05/14/18 0238  WBC 6.3 3.9* 3.8*  NEUTROABS 5.2  --   --   HGB 10.9* 9.6* 10.4*  HCT 37.0 31.3* 34.4*  MCV 92.5 91.3 89.8  PLT 277 245 267   Cardiac Enzymes: No results for input(s): CKTOTAL, CKMB, CKMBINDEX, TROPONINI in the last 168 hours. BNP: Invalid input(s): POCBNP CBG: No results for input(s): GLUCAP in the last 168 hours. D-Dimer No results for input(s): DDIMER in the last 72 hours. Hgb A1c No results for input(s): HGBA1C in the last 72  hours. Lipid Profile No results for input(s): CHOL, HDL, LDLCALC, TRIG, CHOLHDL, LDLDIRECT in the last 72 hours. Thyroid function studies No results for input(s): TSH, T4TOTAL, T3FREE, THYROIDAB in the last 72 hours.  Invalid input(s): FREET3 Anemia work up No results for input(s): VITAMINB12, FOLATE, FERRITIN, TIBC, IRON, RETICCTPCT in the last 72 hours. Urinalysis    Component Value Date/Time   COLORURINE YELLOW 10/04/2014 1457   APPEARANCEUR CLOUDY (A) 10/04/2014 1457   LABSPEC 1.025 10/04/2014 1457   PHURINE 6.5 10/04/2014 1457   GLUCOSEU NEGATIVE 10/04/2014 1457   HGBUR NEGATIVE 10/04/2014 1457   [facility] 10/04/2014 1457   [place] 10/04/2014 1457   PROTEINUR NEGATIVE 10/04/2014 1457   UROBILINOGEN 1.0 10/04/2014 1457   NITRITE NEGATIVE 10/04/2014 1457   LEUKOCYTESUR MODERATE (A) 10/04/2014 1457   Sepsis Labs Invalid input(s): PROCALCITONIN,  WBC,  LACTICIDVEN Microbiology No results found for this or any previous visit (from the past 240 hour(s)).   Time coordinating discharge: 45 minutes  SIGNED:   Tawni Millers, MD  [facility] 05/14/2018, 8:46 AM

## 2018-05-17 ENCOUNTER — Other Ambulatory Visit: Payer: Self-pay | Admitting: Pharmacist

## 2018-05-17 ENCOUNTER — Other Ambulatory Visit: Payer: Self-pay

## 2018-05-17 NOTE — Patient Outreach (Signed)
[facility]) Care Management  [place]   05/17/2018  Kristin Hood Mar 28, 1941 [id number]  Reason for referral: Medication Management  Referral source: [facility] Inpatient Liaison Current insurance:[employer]  PMHx includes but not limited to:    COPD, allergic rhinitis, a fib, CHF, hypertension, history of CVA, morbid obesity, osteoarthritis of hip, hypothyroidism, spinal stenosis of lumbar region.  Outreach:  Successful telephone call with patient.  HIPAA identifiers verified.   Subjective:  Patient is a 78 year old female with the medical conditions listed above. She was recently hospitalized for COPD exacerbation.    Objective: Lab Results  Component Value Date   CREATININE 1.34 (H) 05/14/2018   CREATININE 1.47 (H) 05/13/2018   CREATININE 1.44 (H) 05/12/2018    Lab Results  Component Value Date   HGBA1C 5.0 11/18/2011    Lipid Panel     Component Value Date/Time   CHOL 148 11/18/2011 0420   TRIG 101 11/18/2011 0420   HDL 34 (L) 11/18/2011 0420   CHOLHDL 4.4 11/18/2011 0420   VLDL 20 11/18/2011 0420   LDLCALC 94 11/18/2011 0420    BP Readings from Last 3 Encounters:  05/14/18 106/63  02/23/18 123/65  02/05/18 127/73    Allergies  Allergen Reactions  . Floxin [Ofloxacin] Nausea Only and Other (See Comments)    Dizziness, Vertigo     Medications Reviewed Today    Reviewed by Payton Doughty, CPhT (Pharmacy Technician) on 05/09/18 at [place] List Status: Complete  Medication Order Taking? Sig Documenting Provider Last Dose Status Informant  albuterol (PROVENTIL HFA;VENTOLIN HFA) 108 (90 Base) MCG/ACT inhaler [id number] Yes Inhale 2 puffs into the lungs every 4 (four) hours as needed for wheezing or shortness of breath. Collene Gobble, MD 05/09/2018 am Active Self  amLODipine (NORVASC) 5 MG tablet [id number] Yes Take 5 mg by mouth daily. [provider] 05/09/2018 am Active Self  amoxicillin (AMOXIL) 500 MG  capsule [id number] Yes Take 2,000 mg by mouth See admin instructions. Take four capsules (2000 mg) by mouth one hour prior to dental appointment. [provider] 1st part of January 2020 Active Self  apixaban (ELIQUIS) 5 MG TABS tablet [id number] Yes Take 1 tablet (5 mg total) by mouth 2 (two) times daily. Jerline Pain, MD 05/09/2018 8am Active Self  atorvastatin (LIPITOR) 20 MG tablet [id number] Yes Take 20 mg by mouth at bedtime.  [provider] 05/08/2018 pm Active Self           Med Note Orvan Seen, HEATHER L   Sun May 09, 2018  3:37 PM)    fenofibrate 160 MG tablet [id number] Yes Take 160 mg by mouth at bedtime.  [provider] 05/08/2018 pm Active Self  ferrous sulfate 325 (65 FE) MG EC tablet [id number] Yes Take 325 mg by mouth at bedtime.  [provider] 05/08/2018 pm Active Self  fluticasone (FLONASE) 50 MCG/ACT nasal spray [id number] Yes Place 2 sprays into both nostrils daily as needed for allergies or rhinitis.  [provider] 2-3 months ago Active Self  furosemide (LASIX) 20 MG tablet [id number] Yes Take 1 tablet (20 mg total) by mouth daily. May take extra tablet daily as needed for weight gain  Patient taking differently:  Take 20 mg by mouth See admin instructions. Take one tablet (20 mg) by mouth daily at 2pm, may take an extra tablet (20 mg) if needed for weight gain   Jerline Pain, MD 05/08/2018 afternoon Active Self  guaiFENesin (MUCINEX) 600 MG 12 hr tablet [id number] Yes Take 600 mg by mouth daily. [provider] 05/09/2018 am Active Self  ketoconazole (NIZORAL) 2 % cream [id number] Yes Apply 1 application topically daily as needed for irritation (rash).  [provider] couple weeks ago Active Self           Med Note Warner Mccreedy, CHASITIE R   Mon Nov 12, 2015  3:50 PM)    levothyroxine (SYNTHROID, LEVOTHROID) 175 MCG tablet [id number] Yes TAKE 1 TABLET BY MOUTH EVERY DAY BEFORE BREAKFAST  Patient taking differently:  Take 175 mcg by mouth  daily before breakfast.    Philemon Kingdom, MD 05/09/2018 am Active Self  loratadine (CLARITIN) 10 MG tablet [id number] Yes Take 10 mg by mouth daily as needed for allergies (rash).  [provider] 1-2 weeks ago Active Self           Med Note Orvan Seen, Sharlette Dense   Sun May 09, 2018  3:51 PM)    losartan (COZAAR) 100 MG tablet [id number] Yes Take 100 mg by mouth daily. [provider] 05/09/2018 am Active Self  Multiple Vitamin (MULTIVITAMIN WITH MINERALS) TABS tablet [id number] Yes Take 1 tablet by mouth daily at 2 PM.  [provider] 05/08/2018 afternoon Active Self           Med Note [facility], CHASITIE R   Wed May 16, 2015  2:42 PM)    omeprazole (PRILOSEC) 20 MG capsule [id number] Yes Take 20 mg by mouth daily at 2 PM.  [provider] 05/08/2018 afternoon Active Self  polyvinyl alcohol (ARTIFICIAL TEARS) 1.4 % ophthalmic solution [id number] Yes Place 1 drop into both eyes daily as needed for dry eyes. [provider] 2 weeks ago Active Self  potassium chloride (K-DUR) 10 MEQ tablet [id number] Yes Take 1 tablet (10 mEq total) by mouth daily. Take extra tablet by mouth when taking extra furosemide  Patient taking differently:  Take 10 mEq by mouth See admin instructions. Take one tablet (10 meq) by mouth daily at 2pm, may take an extra tablet (10 meq) if taking an extra furosemide that day   Isaiah Serge, NP 05/08/2018 afternoon Active Self           Med Note Orvan Seen, HEATHER L   Sun May 09, 2018  3:41 PM) 1 tablet yesterday  propranolol ER (INDERAL LA) 60 MG 24 hr capsule [id number] Yes TAKE 2 CAPS IN THE MORNING TAKE 1 CAP IN THE AFTERNOON, 1 CAP AT NIGHT  Patient taking differently:  Take 120 mg by mouth 2 (two) times daily.    Isaiah Serge, NP 05/09/2018 8am Active Self        Discontinued 05/09/18 1551 (Change in therapy)   Tiotropium Bromide Monohydrate (SPIRIVA RESPIMAT) 2.5 MCG/ACT AERS [id number] Yes Inhale 2 puffs into the lungs daily.  Patient taking  differently:  Inhale 2 puffs into the lungs at bedtime.    Collene Gobble, MD 05/06/2018 pm Active Self          Assessment: ASSESSMENT: Date Discharged from Hospital:  Date Medication Reconciliation Performed: 05/17/2018  Medications:  New at Discharge: . 05/14/2018  Adjustments at Discharge: . Furosemide increased to 40mg  daily  Discontinued at Discharge:   Amoxicillin  Patient was recently discharged from hospital and all medications have been reviewed.   Drugs sorted by system:  Neurologic/Psychologic: Propranolol ER (for tremors)  Cardiovascular: Amlodipine, Atorvastatin, Fenofibrate, Furosemide, losartan  Pulmonary/Allergy: Albuterol HFA, Fluticasone, Guaifenesin,Loratadine,  Spiriva  Gastrointestinal:  Omeprazole,   Endocrine: Levothyroxine  Topical: Ketoconazole cream   Vitamins/Minerals/Supplements: Ferrous Sulfate, Multiple Vitamin, Potassium chloride,   Miscellaneous: Artificial Tears  Medication Review Findings:  . Atorvastatin/Fenofibrate-increased risk of myopathy   Medication Assistance Findings:  Medication assistance needs identified.   Patient was previously approved to receive Spiriva from [employer].  Patient meets the financial requirements to receive Eliquis from [place] but she has not spent 3% of her income in out-of-pocket medication expenses.   Extra Help:   []  Already receiving Full Extra Help  []  Already receiving Partial Extra Help  []  Eligible based on reported income and assets  [x]  Not Eligible based on reported income and assets  Patient Assistance Programs: Additional medication assistance options reviewed with patient as warranted:  No other options identified   Patient wondered if she could take omeprazole prn versus daily.  Patient was encouraged to speak with her provider. PRN proton pump inhibitors is actually recommended over daily PPI therapy if the patient is not having daily  symptoms or another medical reason for daily dosing.  Plan:  Will route note to PCP. Marland Kitchen  Will close patient's pharmacy episode for now as she has not spent the required out -of-pocket spend. Patient will keep a watch out for her EOBs and will contact [facility] when she gets close to spending 3% of her income on medication expenses so we can complete her Eliquis application.   [name], PharmD, [place] Clinical Pharmacist [phone]

## 2018-05-18 ENCOUNTER — Ambulatory Visit: Payer: PPO | Admitting: Emergency Medicine

## 2018-05-18 ENCOUNTER — Encounter: Payer: Self-pay | Admitting: Emergency Medicine

## 2018-05-18 DIAGNOSIS — J449 Chronic obstructive pulmonary disease, unspecified: Secondary | ICD-10-CM | POA: Diagnosis not present

## 2018-05-18 NOTE — Patient Instructions (Signed)
Please continue Spiriva Respimat 2 puffs once daily as you have been taking it.  Depending on how your breathing is doing going forward we may decide to change to an alternative to see if you get more benefit. Please use albuterol 2 puffs if you need it for shortness of breath, chest tightness, wheezing.  Agree that you should use this with a spacer. Follow with Dr [name] in 6 months or sooner if you have any problems

## 2018-05-18 NOTE — Assessment & Plan Note (Signed)
She had a recent admission for dyspnea but it seemed that this was more related to volume overload and diastolic CHF and COPD.  She remains on Spiriva, is not entirely clear whether it helps her significantly.  She does benefit from her albuterol when she uses it.  I talked her today about possibly changing to LABA / LAMA, but she wants to stay on the Spiriva for now.  We may need to revisit going forward depending on her overall clinical status.  We will follow-up in 6 months

## 2018-05-18 NOTE — Progress Notes (Signed)
Subjective:    Patient ID: Kristin Hood, female    DOB: [birth year], 78 y.o.   MRN: [id number]  Shortness of Breath  Pertinent negatives include no ear pain, fever, headaches, leg swelling, rash, rhinorrhea, sore throat, vomiting or wheezing.   78 year old woman, former smoker (60 pack years), with history of hypertension, atrial fibrillation, cerebrovascular disease, and breast cancer treated in 2001 w mastectomy, no XRT. She has history of some mediastinal lymphadenopathy and splenomegaly that has been followed by oncology in the aftermath of her breast cancer. She was just admitted to the hospital 10/2014 for . A CT scan of the chest was performed on 11/16/14 personally reviewed today. This showed no evidence of PE but some evidence for air trapping, tracheobronchomalacia, bronchitis. An echocardiogram was performed 8/19 that showed concentric left ventricular hypertrophy with an EF of 60-65%, moderately dilated left atrium, moderately dilated right atrium, mildly dilated right ventricle and an estimated PASP of 46 mmHg. She was treated with albuterol, was sent home w a script - seems to help her.  She sleeps on her L side with head on 2 pillows or in a recliner, does not really snore, feels well rested in the am. No witness apneas.    ROV 12/09/16 -- This follow-up visit for patient with a history of breast cancer, mediastinal lymphadenopathy that we have followed with serial imaging. Also with a history of COPD, bronchial malacia, cough, pulmonary hypertension. Left CT scan of the chest was in 07/15/16. No change in lymphadenopathy, no change in posterior left upper lobe tiny pulmonary nodule. She tells me that her breathing is a bit more bothersome - she has needed to do more, more exertion, since the death of her husband. She is using albuterol about 2x a week. She is on Spiriva Respimat. Her cough is well controlled. Fluticasone nasal spray prn, uses mucinex regularly.   ROV 05/18/2018  --78 year old woman with history of breast cancer, lymphadenopathy that is been felt to likely be a low-grade lymphoma.  She is been followed by Dr. [name] and has been on observation.  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 They are planning to follow off therapy for now.  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 She also has A fib, dCHF. She was just admitted for acute dyspnea, diastolic CHF exacerbation. She is much better with diuresis. She remains on Spiriva - sometimes forgets it. She is unsure whether she benefits from it. She uses albuterol about once a week - usually for dyspnea. It does seem to help her.        Review of Systems  Constitutional: Negative for fever and unexpected weight change.  HENT: Positive for congestion and postnasal drip. Negative for dental problem, ear pain, nosebleeds, rhinorrhea, sinus pressure, sneezing, sore throat and trouble swallowing.   Eyes: Negative for redness and itching.  Respiratory: Positive for shortness of breath. Negative for cough, chest tightness and wheezing.   Cardiovascular: Negative for palpitations and leg swelling.  Gastrointestinal: Negative for nausea and vomiting.  Genitourinary: Negative for dysuria.  Musculoskeletal: Negative for joint swelling.  Skin: Negative for rash.  Neurological: Negative for headaches.  Hematological: Does not bruise/bleed easily.  Psychiatric/Behavioral: Negative for dysphoric mood. The patient is not nervous/anxious.        Objective:   Physical Exam Vitals:   05/18/18 1116  BP: (!) 100/54  Pulse: 96  SpO2: 95%  Weight: 213 lb  9.6 oz (96.9 kg)  Height: 5\' 6"  (1.676 m)   Gen: Pleasant, well-nourished, in no distress,  normal affect  ENT: No lesions,  mouth clear,  oropharynx clear, no postnasal drip  Neck: No JVD, no stridor  Lungs: No use of accessory muscles, no crackles or wheeze,  decreased at bases  Cardiovascular: RRR, heart sounds normal, no murmur or gallops, no peripheral edema  Musculoskeletal: No deformities, no cyanosis or clubbing  Neuro: alert, non focal, some mild tremor.   Skin: Warm, no lesions or rash      Assessment & Plan:  COPD (chronic obstructive pulmonary disease) ([place]) She had a recent admission for dyspnea but it seemed that this was more related to volume overload and diastolic CHF and COPD.  She remains on Spiriva, is not entirely clear whether it helps her significantly.  She does benefit from her albuterol when she uses it.  I talked her today about possibly changing to LABA / LAMA, but she wants to stay on the Spiriva for now.  We may need to revisit going forward depending on her overall clinical status.  We will follow-up in 6 months  Baltazar Apo, MD, PhD 05/18/2018, 11:51 AM [facility] [phone] or if no answer [phone]

## 2018-05-21 DIAGNOSIS — I509 Heart failure, unspecified: Secondary | ICD-10-CM | POA: Diagnosis not present

## 2018-05-21 DIAGNOSIS — I10 Essential (primary) hypertension: Secondary | ICD-10-CM | POA: Diagnosis not present

## 2018-05-21 DIAGNOSIS — Z6836 Body mass index (BMI) 36.0-36.9, adult: Secondary | ICD-10-CM | POA: Diagnosis not present

## 2018-05-21 DIAGNOSIS — E890 Postprocedural hypothyroidism: Secondary | ICD-10-CM | POA: Diagnosis not present

## 2018-06-04 ENCOUNTER — Encounter: Payer: Self-pay | Admitting: Cardiology

## 2018-06-04 ENCOUNTER — Ambulatory Visit: Payer: PPO | Admitting: Cardiology

## 2018-06-04 VITALS — BP 102/60 | HR 80 | Ht 66.0 in | Wt 216.8 lb

## 2018-06-04 DIAGNOSIS — I272 Pulmonary hypertension, unspecified: Secondary | ICD-10-CM

## 2018-06-04 DIAGNOSIS — I5032 Chronic diastolic (congestive) heart failure: Secondary | ICD-10-CM

## 2018-06-04 MED ORDER — FUROSEMIDE 40 MG PO TABS: 40.0000 mg | ORAL_TABLET | Freq: Every day | ORAL | 3 refills | Status: DC

## 2018-06-04 MED ORDER — POTASSIUM CHLORIDE ER 10 MEQ PO TBCR: 10.0000 meq | EXTENDED_RELEASE_TABLET | Freq: Every day | ORAL | 3 refills | Status: DC

## 2018-06-04 NOTE — Addendum Note (Signed)
Addended by: Drue Novel I on: 06/04/2018 10:09 AM   Modules accepted: Orders

## 2018-06-04 NOTE — Progress Notes (Signed)
Patient ID: Kristin Hood, female   DOB: [birth year], 78 y.o.   MRN: [id number]      [address] [place], [place]  [address] Phone: [phone] Fax:  [phone]  Date:  06/04/2018   ID:  Kristin Hood, DOB 05/29/1940, MRN [id number]  PCP:  [name], MD   History of Present Illness: Kristin Hood is a 78 y.o. female with atrial fibrillation discovered on 08/20/12, chronic anticoagulation, COPD, prior stroke, received TPA for cerebral artery occlusion (could not raise arm-8/13) here for followup. Lives next door to Kristin Hood ([place]).    Echocardiogram demonstrated mild to moderate left atrial enlargement, normal ejection fraction, mild aortic stenosis, MAC, mild mitral regurgitation.  Had 3 knee replacements.   She underwent catheterization on 08/14/11 which showed no evidence of coronary artery disease. LVEDP was 14 mm mercury.  Went to ER on 12/10/12 had a terrific pain in her chest. Lasted about hour at home BP 159/100, 3 times. Went on to ER. Was worried. Once she got to [facility] she quit hurting. Sent to ER. Wanted to keep her overnight. Did troponin did well. Did OK. Prob GERD.   Went again to [facility]. Afib increased rate. PET scan. 3 nodules one thyroid. Endocrine, no good sample.   Unfortunately, her son-in-law died 2 weeks after neck surgery from pulmonary embolism.  16-Aug-2014 - Hip surgery. Dr. Wynelle Link. She has had extensive workup including oncology workup for nodules in her lungs, lymph nodes, thyroid nodule. She will be followed up on a yearly basis and oncology. Reassurance. Overall, no chest pain other than mild GERD at night after dinner. No syncope, no change in shortness of breath.  05/16/15-baseline shortness of breath. She did go to the emergency room because of centralized chest discomfort. Troponin was normal, EKG unremarkable. Thankfully she started taking Zantac and this was improved.  11/12/15-reassuring thyroid tissue upon excision.  No cancer. Shortness of breath continues. Baseline. On Symbicort. COPD. Eliquis has been expensive. Trying to get assistance.  15-Aug-2016 - AFIB, Kristin Hood) died. Needs toe repositioned. Right varicose vein. No significant shortness of breath. Occasionally she will still feel palpitations in the evening hours, around 4 PM. She has taken an extra Inderal at that time which seems to help area she recounted a story where she felt it racing, her grandson asked her if she wanted to go to dinner and she forgot about it and did not have any problems. Once again Kristin Hood and Kristin Hood are her neighbor.  05/21/17 - 12/30/16 hospitalization. Combination from heart and lungs. Weight set at 240 when left the hospital.  Now her base weight is 235.  She understands to take an extra Lasix if necessary.  She has been doing a good job on maintaining this weight.  She still is quite deconditioned, debilitated.  Her back and hip pain is playing a major role in this.  She is not having any significant chest discomfort.  Occasionally she will feel heart palpitations from her atrial fibrillation which is permanent.  She has not had any bleeding episodes.  When discussing her orthopedic issues, she understands that she would be high risk for any surgical procedures.  Whether this pertains to her toe or her back she understands.  Creat 1.1. Kristin Hood. Back. Prior stroke   06/04/2018- here for the follow-up of recent hospitalization with acute on chronic diastolic heart failure and hypoxic respiratory failure.  She was diuresed several liters, felt  better.  Lasix was increased to 40 mg a day from 20.  She is maintaining her weight.  Atrial fibrillation stable.  Please see below for details.  Hospitalization records reviewed.  Wt Readings from Last 3 Encounters:  06/04/18 216 lb 12.8 oz (98.3 kg)  05/18/18 213 lb 9.6 oz (96.9 kg)  05/14/18 208 lb (94.3 kg)     Past Medical History:  Diagnosis Date  . Acute congestive heart failure  ([place])   . Acute diastolic CHF (congestive heart failure) ([place]) 12/30/2016  . Acute respiratory failure with hypoxemia ([place]) 06/09/2016  . Acute respiratory failure with hypoxia ([place]) 12/30/2016  . Anginal pain ([place])   . ARF (acute renal failure) ([place]) 06/11/2016  . Arthritis   . Atrial fibrillation ([place]) 07/2012   Eliquis, rate controlled  . Benign hypertensive heart disease without heart failure 12/25/2012  . Breast cancer ([place]) 2001   left mastectomy  . Chest pain 12/11/2012  . CHF (congestive heart failure) ([place]) 06/10/2016  . Chronic atrial fibrillation 11/16/2014  . Chronic diastolic heart failure ([place]) 02/13/2014  . Coarse tremors    , Essential  . Complication of anesthesia    SMALLER TUBE FOR INTUBATION AS GAGS ON TUBE  . COPD (chronic obstructive pulmonary disease) ([place])   . COPD exacerbation ([place]) 12/30/2016  . CVA (cerebral vascular accident) ([place]) 2013  . Dysrhythmia 08/20/2012   Atrial Fibrillation  . Essential and other specified forms of tremor 06/18/2012  . Essential hypertension 06/18/2012  . Family history of adverse reaction to anesthesia    mother experienced pain with intubation   . GERD (gastroesophageal reflux disease)    history of   . Hard of hearing   . Heart murmur   . History of chemotherapy 11/13/1998 to 2010   Adriamycin/Docotaxol, Dexorubicin/Taxotere, Femara, Tamoxifen  . History of CVA (cerebrovascular accident) 12/11/2012  . History of hiatal hernia    history of   . Hx of adenomatous colonic polyps 2006   , Benign  . Hypercholesteremia   . Hyperlipidemia 02/13/2014  . Hypertension    , With mild concentric left ventricular hypertrophy  . Long term current use of anticoagulant therapy 12/25/2012  . Mixed dyslipidemia   . Mixed hyperlipidemia 12/25/2012  . Morbid obesity ([place]) 06/18/2012  . Phlebitis and thrombophlebitis of superficial vessels of lower extremities 12/25/2012  . 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 Pure hypercholesterolemia  12/25/2012  . Shingles   . Shortness of breath dyspnea    on exertion   . SOB (shortness of breath) 11/16/2014  . Spider veins   . Stroke ([place]) 11/17/2011   left side brain-speech-TPA  . Thyroid nodule   . Tremor, essential   . Unspecified cerebral artery occlusion with cerebral infarction 11/18/2011  . Valvular sclerosis 09/23/2003   without stenosis  . Varicose veins     Past Surgical History:  Procedure Laterality Date  . APPENDECTOMY  07/10/1957  . BIOPSY THYROID  12/01/2013   ultrasound  . BREAST SURGERY    . BUNIONECTOMY  11/29/1988   bilateral with hammer toes  . CARDIAC CATHETERIZATION  07/2010   , Without significant CAD  . CARDIAC CATHETERIZATION  07/2011   Kristin Hood  . CESAREAN SECTION    . CESAREAN SECTION  05/1968, 07/1965  . EYE SURGERY  01/31/2013   eye lid; cataract surgery bilat   . JOINT REPLACEMENT  05/2001   left knee  . JOINT REPLACEMENT  11/2001   right knee  . JOINT REPLACEMENT  01/2008  redo right knee  . KNEE SURGERY  2009   ,Total knee replacement  . LEFT HEART CATHETERIZATION WITH CORONARY ANGIOGRAM N/A 08/14/2011   Procedure: LEFT HEART CATHETERIZATION WITH CORONARY ANGIOGRAM;  Surgeon: [name], MD;  Location[facility] CATH LAB;  Service: Cardiovascular;  Laterality: N/A;  . Left Rotator Cuff    . MASTECTOMY  10/19/1998   Radical, left -with lymph nodes (8 total)  . Reverse bunionectomy  08/29/1993   removal of Tibial Sesamoid-left  . TEE WITHOUT CARDIOVERSION  11/19/2011   Procedure: TRANSESOPHAGEAL ECHOCARDIOGRAM (TEE);  Surgeon: [name], MD;  Location: [facility] ENDOSCOPY;  Service: Cardiovascular;  Laterality: N/A;  . THYROIDECTOMY N/A 03/02/2015   Procedure: TOTAL THYROIDECTOMY;  Surgeon: Armandina Gemma, MD;  Location: [facility] ORS;  Service: General;  Laterality: N/A;  . TONSILLECTOMY    . TOTAL HIP ARTHROPLASTY Left 10/11/2014   Procedure: LEFT TOTAL HIP ARTHROPLASTY ANTERIOR APPROACH;  Surgeon: [name], MD;  Location: [facility] ORS;  Service:  Orthopedics;  Laterality: Left;    Current Outpatient Medications  Medication Sig Dispense Refill  . albuterol (PROVENTIL HFA;VENTOLIN HFA) 108 (90 Base) MCG/ACT inhaler Inhale 2 puffs into the lungs every 4 (four) hours as needed for wheezing or shortness of breath. 1 Inhaler 0  . apixaban (ELIQUIS) 5 MG TABS tablet Take 1 tablet (5 mg total) by mouth 2 (two) times daily. 180 tablet 0  . atorvastatin (LIPITOR) 20 MG tablet Take 20 mg by mouth at bedtime.     . fenofibrate 160 MG tablet Take 160 mg by mouth at bedtime.     . ferrous sulfate 325 (65 FE) MG EC tablet Take 325 mg by mouth at bedtime.     . fluticasone (FLONASE) 50 MCG/ACT nasal spray Place 2 sprays into both nostrils daily as needed for allergies or rhinitis.     . furosemide (LASIX) 40 MG tablet Take 1 tablet (40 mg total) by mouth daily for 30 days. 30 tablet 0  . guaiFENesin (MUCINEX) 600 MG 12 hr tablet Take 600 mg by mouth daily.    Marland Kitchen ketoconazole (NIZORAL) 2 % cream Apply 1 application topically daily as needed for irritation (rash).   0  . levothyroxine (SYNTHROID, LEVOTHROID) 175 MCG tablet TAKE 1 TABLET BY MOUTH EVERY DAY BEFORE BREAKFAST 90 tablet 3  . loratadine (CLARITIN) 10 MG tablet Take 10 mg by mouth daily as needed for allergies (rash).     Marland Kitchen losartan (COZAAR) 100 MG tablet Take 100 mg by mouth daily.    . Multiple Vitamin (MULTIVITAMIN WITH MINERALS) TABS tablet Take 1 tablet by mouth daily at 2 PM.     . omeprazole (PRILOSEC) 20 MG capsule Take 20 mg by mouth daily at 2 PM.     . polyvinyl alcohol (ARTIFICIAL TEARS) 1.4 % ophthalmic solution Place 1 drop into both eyes daily as needed for dry eyes.    . potassium chloride (K-DUR) 10 MEQ tablet Take 1 tablet (10 mEq total) by mouth daily. 90 tablet 3  . propranolol ER (INDERAL LA) 60 MG 24 hr capsule TAKE 2 CAPS IN THE MORNING TAKE 1 CAP IN THE AFTERNOON, 1 CAP AT NIGHT 360 capsule 2  . Tiotropium Bromide Monohydrate (SPIRIVA RESPIMAT) 2.5 MCG/ACT AERS Inhale 2  puffs into the lungs daily. 3 Inhaler 4   No current facility-administered medications for this visit.     Allergies:    Allergies  Allergen Reactions  . Floxin [Ofloxacin] Nausea Only and Other (See Comments)    Dizziness,  Vertigo     Social History:  The patient  reports that she quit smoking about 26 years ago. Her smoking use included cigarettes. She has a 60.00 pack-year smoking history. She has never used smokeless tobacco. She reports current alcohol use of about 1.0 standard drinks of alcohol per week. She reports that she does not use drugs.   ROS: All other review of systems negative.  Denies any fever chills nausea vomiting syncope  PHYSICAL EXAM: VS:  BP 102/60   Pulse 80   Ht 5' 6"  (1.676 m)   Wt 216 lb 12.8 oz (98.3 kg)   SpO2 95%   BMI 34.99 kg/m  GEN: Well nourished, well developed, in no acute distress  HEENT: normal  Neck: no JVD, carotid bruits, or masses Cardiac: RRR; 2/6 SM, no rubs, or gallops,no edema  Respiratory:  clear to auscultation bilaterally, normal work of breathing GI: soft, nontender, nondistended, + BS MS: no deformity or atrophy  Skin: warm and dry, no rash Neuro:  Alert and Oriented x 3, Strength and sensation are intact Psych: euthymic mood, full affect     EKG: No new ECG. ECG from 12/2016 personally reviewed and AFIB 90. 07/31/14-76, atrial fibrillation-no significant change from prior. AFIB 77 No ST changes. Personally viewed  ECHO 06/10/16:  - Left ventricle: The cavity size was normal. Wall thickness was   normal. Systolic function was normal. The estimated ejection   fraction was in the range of 55% to 60%. Wall motion was normal;   there were no regional wall motion abnormalities. - Aortic valve: There was mild stenosis. There was trivial   regurgitation. - Mitral valve: Moderately calcified annulus. There was mild   regurgitation. - Left atrium: The atrium was severely dilated. - Right atrium: The atrium was moderately  dilated. - Pulmonary arteries: Systolic pressure was mildly increased. PA   peak pressure: 46 mm Hg (S).  Impressions:  - Normal LV systolic function; biatrial enlargement; calcified   aortic valve with mild AS with mean gradient 10 mmHg and trace   AI; mild MR; mild TR with mildly elevated pulmonary pressure.  ASSESSMENT AND PLAN:  Permanent atrial fibrillation  - Currently well rate controlled. No change in Inderal.   - Occasionally will feel increased heart rate in the evening hours, late afternoon. She takes an extra Inderal if this happens.  No changes made.  History of stroke  - Increases her future stroke risk. Continue with chronic anticoagulation. Eliquis. No new symptoms.  Continue with current plan.  No bleeding.  Non-Hodgkin's lymphoma -Surveillance by Dr. [name].  Chronic anticoagulation  - She has not had any bleeding issues. Continue with Eliquis.  - Monitor basic metabolic profile and CBC. Checking today. No bruising.   GERD  - Zantac. Prior ER visit in January 2017. Reassurance. Prior cath reassuring.  Aortic atherosclerosis  - statin, HTN control.  No changes  Obesity, morbid (BMI >35 with 2 or more comorbidities)  - Continue to encourage weight loss.  Chronic diastolic heart failure  - She is watching her daily weights. They have been stable Since recent hospitalization 05/2018.  She is taking Lasix now 40 mg a day increased from 20 mg a day. Continue with medications as above.  I think she only needs 10 mEq of potassium since her last check was 4.7. Continue to encourage exercise, movement.  We will check an echocardiogram to ensure that there is been no change in the past 2 years since her recent hospitalization.  Secondary pulmonary hypertension  - from morbid obesity, Noted on ECHO. Tx HTN.  Repeating echo.  Varicose veins  - She has seen Dr. Oneida Alar in the past.  No changes made.  Essential hypertension  - Doing well.  Continues to be well  controlled.  At prior visit grieving the loss of her husband, Clair Gulling   - She is very appreciative of Melina Copa and Thurmond Butts her neighbor who helped significantly. Still difficult for her at times.   Six-month follow-up with APP, 12 months with me  Signed, [name], MD [facility]  06/04/2018 9:53 AM

## 2018-06-04 NOTE — Patient Instructions (Signed)
Medication Instructions:  Your physician recommends that you continue on your current medications as directed. Please refer to the Current Medication list given to you today.  Take furosemide (lasix) 40 mg daily  Take potassium (k-dur) 10 mEq daily  If you need a refill on your cardiac medications before your next appointment, please call your pharmacy.   Lab work: None ordered If you have labs (blood work) drawn today and your tests are completely normal, you will receive your results only by: Marland Kitchen MyChart Message (if you have MyChart) OR . A paper copy in the mail If you have any lab test that is abnormal or we need to change your treatment, we will call you to review the results.  Testing/Procedures: Your physician has requested that you have an echocardiogram. Echocardiography is a painless test that uses sound waves to create images of your heart. It provides your doctor with information about the size and shape of your heart and how well your heart's chambers and valves are working. This procedure takes approximately one hour. There are no restrictions for this procedure.  Follow-Up: At [facility], you and your health needs are our priority.  As part of our continuing mission to provide you with exceptional heart care, we have created designated Provider Care Teams.  These Care Teams include your primary Cardiologist (physician) and Advanced Practice Providers (APPs -  Physician Assistants and Nurse Practitioners) who all work together to provide you with the care you need, when you need it. You will need a follow up appointment in 12 months.  Please call our office 2 months in advance to schedule this appointment.  You may see Dr. Excell Seltzer one of the following Advanced Practice Providers on your designated Care Team:   Truitt Merle, NP Cecilie Kicks, NP . Kathyrn Drown, NP  Any Other Special Instructions Will Be Listed Below (If Applicable).

## 2018-06-07 ENCOUNTER — Telehealth: Payer: Self-pay

## 2018-06-07 NOTE — Telephone Encounter (Signed)
I spoke to the patient and clarified her prescription with her.  She verbalized understanding and was able to pick up medication.

## 2018-06-08 ENCOUNTER — Ambulatory Visit: Payer: PPO | Admitting: Emergency Medicine

## 2018-06-17 ENCOUNTER — Ambulatory Visit (HOSPITAL_COMMUNITY): Payer: PPO

## 2018-06-21 ENCOUNTER — Other Ambulatory Visit: Payer: Self-pay | Admitting: Cardiology

## 2018-07-01 ENCOUNTER — Telehealth: Payer: Self-pay | Admitting: Cardiovascular Disease

## 2018-07-01 NOTE — Telephone Encounter (Signed)
Patient called and discussed cancelling elective echocardiogram due to concerns for Covid19 and need for social distancing . Patient stable with no urgent symptoms.  TTE scheduled for 4/14 can be rescheduled for 3-6 months

## 2018-07-13 ENCOUNTER — Other Ambulatory Visit (HOSPITAL_COMMUNITY): Payer: PPO

## 2018-07-13 ENCOUNTER — Other Ambulatory Visit: Payer: Self-pay | Admitting: Cardiology

## 2018-07-13 NOTE — Telephone Encounter (Signed)
Pt last saw Dr [name] 06/04/18, last labs 05/14/18 Creat 1.34, age 78, weight 98.3kg, based on specified criteria pt is on appropriate dosage of Eliquis 5mg  BID.  Will refill rx.

## 2018-07-13 NOTE — Telephone Encounter (Deleted)
Eliquis 5mg  refill request received; pt is 78 yrs old, wt-98.3kg, Crea-1.34 on 05/14/2018, last seen by [name] on 06/04/2018; will send refill.

## 2018-07-29 DIAGNOSIS — E890 Postprocedural hypothyroidism: Secondary | ICD-10-CM | POA: Diagnosis not present

## 2018-07-29 DIAGNOSIS — Z853 Personal history of malignant neoplasm of breast: Secondary | ICD-10-CM | POA: Diagnosis not present

## 2018-07-29 DIAGNOSIS — E782 Mixed hyperlipidemia: Secondary | ICD-10-CM | POA: Diagnosis not present

## 2018-07-29 DIAGNOSIS — M19041 Primary osteoarthritis, right hand: Secondary | ICD-10-CM | POA: Diagnosis not present

## 2018-07-29 DIAGNOSIS — J449 Chronic obstructive pulmonary disease, unspecified: Secondary | ICD-10-CM | POA: Diagnosis not present

## 2018-07-29 DIAGNOSIS — I5032 Chronic diastolic (congestive) heart failure: Secondary | ICD-10-CM | POA: Diagnosis not present

## 2018-07-29 DIAGNOSIS — I10 Essential (primary) hypertension: Secondary | ICD-10-CM | POA: Diagnosis not present

## 2018-07-29 DIAGNOSIS — I509 Heart failure, unspecified: Secondary | ICD-10-CM | POA: Diagnosis not present

## 2018-07-29 DIAGNOSIS — I4891 Unspecified atrial fibrillation: Secondary | ICD-10-CM | POA: Diagnosis not present

## 2018-08-03 ENCOUNTER — Telehealth: Payer: Self-pay | Admitting: Cardiology

## 2018-08-03 NOTE — Telephone Encounter (Signed)
° ° ° °*  STAT* If patient is at the pharmacy, call can be transferred to refill team.   1. Which medications need to be refilled? (please list name of each medication and dose if known) furosemide (LASIX) 40 MG tablet 2. Which pharmacy/location (including street and city if local pharmacy) is medication to be sent to? [facility]  3. Do they need a 30 day or 90 day supply? [place]

## 2018-08-04 NOTE — Telephone Encounter (Signed)
Called pt and pt stated that she did not need an refill, that she just needed to change her pharmacy and I did change her pharmacy for her and I informed pt that if she has any other problems, questions or concerns to call the office. Pt verbalized understanding.

## 2018-08-19 ENCOUNTER — Other Ambulatory Visit: Payer: Self-pay

## 2018-08-19 ENCOUNTER — Ambulatory Visit (HOSPITAL_COMMUNITY): Payer: PPO

## 2018-08-19 ENCOUNTER — Inpatient Hospital Stay: Payer: PPO | Attending: Internal Medicine

## 2018-08-19 ENCOUNTER — Telehealth: Payer: Self-pay | Admitting: *Deleted

## 2018-08-19 ENCOUNTER — Ambulatory Visit (HOSPITAL_COMMUNITY)
Admission: RE | Admit: 2018-08-19 | Discharge: 2018-08-19 | Disposition: A | Discharge status: Home / Self Care | Payer: PPO | Source: Ambulatory Visit | Attending: Internal Medicine | Admitting: Internal Medicine

## 2018-08-19 ENCOUNTER — Other Ambulatory Visit: Payer: Self-pay | Admitting: *Deleted

## 2018-08-19 DIAGNOSIS — C8580 Other specified types of non-Hodgkin lymphoma, unspecified site: Secondary | ICD-10-CM | POA: Diagnosis not present

## 2018-08-19 DIAGNOSIS — K8020 Calculus of gallbladder without cholecystitis without obstruction: Secondary | ICD-10-CM | POA: Diagnosis not present

## 2018-08-19 DIAGNOSIS — Z7901 Long term (current) use of anticoagulants: Secondary | ICD-10-CM | POA: Insufficient documentation

## 2018-08-19 DIAGNOSIS — C8510 Unspecified B-cell lymphoma, unspecified site: Secondary | ICD-10-CM

## 2018-08-19 DIAGNOSIS — Z9012 Acquired absence of left breast and nipple: Secondary | ICD-10-CM | POA: Insufficient documentation

## 2018-08-19 DIAGNOSIS — Z853 Personal history of malignant neoplasm of breast: Secondary | ICD-10-CM | POA: Insufficient documentation

## 2018-08-19 DIAGNOSIS — I10 Essential (primary) hypertension: Secondary | ICD-10-CM | POA: Insufficient documentation

## 2018-08-19 DIAGNOSIS — Z79899 Other long term (current) drug therapy: Secondary | ICD-10-CM | POA: Insufficient documentation

## 2018-08-19 DIAGNOSIS — I482 Chronic atrial fibrillation, unspecified: Secondary | ICD-10-CM | POA: Insufficient documentation

## 2018-08-19 DIAGNOSIS — Z8673 Personal history of transient ischemic attack (TIA), and cerebral infarction without residual deficits: Secondary | ICD-10-CM | POA: Diagnosis not present

## 2018-08-19 DIAGNOSIS — R161 Splenomegaly, not elsewhere classified: Secondary | ICD-10-CM | POA: Diagnosis not present

## 2018-08-19 DIAGNOSIS — J90 Pleural effusion, not elsewhere classified: Secondary | ICD-10-CM | POA: Diagnosis not present

## 2018-08-19 LAB — CMP (CANCER CENTER ONLY)
ALT: 12 U/L (ref 0–44)
AST: 24 U/L (ref 15–41)
Albumin: 3.5 g/dL (ref 3.5–5.0)
Alkaline Phosphatase: 31 U/L — ABNORMAL LOW (ref 38–126)
Anion gap: 11 (ref 5–15)
BUN: 53 mg/dL — ABNORMAL HIGH (ref 8–23)
CO2: 27 mmol/L (ref 22–32)
Calcium: 9.5 mg/dL (ref 8.9–10.3)
Chloride: 98 mmol/L (ref 98–111)
Creatinine: 1.69 mg/dL — ABNORMAL HIGH (ref 0.44–1.00)
GFR, Est AFR Am: 33 mL/min — ABNORMAL LOW (ref >60)
GFR, Estimated: 28 mL/min — ABNORMAL LOW (ref >60)
Glucose, Bld: 98 mg/dL (ref 70–99)
Potassium: 4.1 mmol/L (ref 3.5–5.1)
Sodium: 136 mmol/L (ref 135–145)
Total Bilirubin: 0.9 mg/dL (ref 0.3–1.2)
Total Protein: 8.6 g/dL — ABNORMAL HIGH (ref 6.5–8.1)

## 2018-08-19 LAB — CBC WITH DIFFERENTIAL (CANCER CENTER ONLY)
Abs Immature Granulocytes: 0.01 10*3/uL (ref 0.00–0.07)
Basophils Absolute: 0 10*3/uL (ref 0.0–0.1)
Basophils Relative: 1 %
Eosinophils Absolute: 0.1 10*3/uL (ref 0.0–0.5)
Eosinophils Relative: 2 %
HCT: 35.9 % — ABNORMAL LOW (ref 36.0–46.0)
Hemoglobin: 10.8 g/dL — ABNORMAL LOW (ref 12.0–15.0)
Immature Granulocytes: 0 %
Lymphocytes Relative: 11 %
Lymphs Abs: 0.4 10*3/uL — ABNORMAL LOW (ref 0.7–4.0)
MCH: 28.1 pg (ref 26.0–34.0)
MCHC: 30.1 g/dL (ref 30.0–36.0)
MCV: 93.5 fL (ref 80.0–100.0)
Monocytes Absolute: 0.5 10*3/uL (ref 0.1–1.0)
Monocytes Relative: 14 %
Neutro Abs: 2.5 10*3/uL (ref 1.7–7.7)
Neutrophils Relative %: 72 %
Platelet Count: 266 10*3/uL (ref 150–400)
RBC: 3.84 MIL/uL — ABNORMAL LOW (ref 3.87–5.11)
RDW: 14.7 % (ref 11.5–15.5)
WBC Count: 3.5 10*3/uL — ABNORMAL LOW (ref 4.0–10.5)
nRBC: 0 % (ref 0.0–0.2)

## 2018-08-19 LAB — LACTATE DEHYDROGENASE: LDH: 192 U/L (ref 98–192)

## 2018-08-19 MED ORDER — SODIUM CHLORIDE (PF) 0.9 % IJ SOLN
INTRAMUSCULAR | Status: AC
  Filled 2018-08-19: qty 50

## 2018-08-19 NOTE — Telephone Encounter (Signed)
"  Gaspar Cola Long CT calling in reference to Kristin Hood CT Chest, Abd, Pelvis with contrast.  Her GFR = 28 is too low per protocol for IV contrast."  Verbal order received and read back from Dr. [name] for scans to be without contrast today.  Kristin Hood notified with this call okay to not use IV contrast per Dr. [name].  New orders entered at this time.

## 2018-08-24 ENCOUNTER — Inpatient Hospital Stay (HOSPITAL_BASED_OUTPATIENT_CLINIC_OR_DEPARTMENT_OTHER): Payer: PPO | Admitting: Internal Medicine

## 2018-08-24 ENCOUNTER — Encounter: Payer: Self-pay | Admitting: Internal Medicine

## 2018-08-24 ENCOUNTER — Other Ambulatory Visit: Payer: Self-pay

## 2018-08-24 VITALS — BP 120/70 | HR 79 | Temp 98.3°F | Resp 18 | Ht 66.0 in | Wt 217.5 lb

## 2018-08-24 DIAGNOSIS — Z853 Personal history of malignant neoplasm of breast: Secondary | ICD-10-CM

## 2018-08-24 DIAGNOSIS — Z79899 Other long term (current) drug therapy: Secondary | ICD-10-CM

## 2018-08-24 DIAGNOSIS — C8580 Other specified types of non-Hodgkin lymphoma, unspecified site: Secondary | ICD-10-CM | POA: Diagnosis not present

## 2018-08-24 DIAGNOSIS — Z9012 Acquired absence of left breast and nipple: Secondary | ICD-10-CM | POA: Diagnosis not present

## 2018-08-24 DIAGNOSIS — I5031 Acute diastolic (congestive) heart failure: Secondary | ICD-10-CM

## 2018-08-24 NOTE — Progress Notes (Signed)
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  PRIOR THERAPY: None.  CURRENT THERAPY: Observation.  INTERVAL HISTORY: Kristin Hood 78 y.o. female returns to the clinic today for 6 months follow-up visit.  Her daughter [name] was available by phone during the visit.  The patient is feeling fine today with no concerning complaints except for the shortness of breath secondary to congestive heart failure.  She denied having any chest pain, cough or hemoptysis.  She denied having any recent weight loss or night sweats.  She has no nausea, vomiting, diarrhea or constipation.  She is currently on Lasix 80 mg p.o. daily and her dose was adjusted for the congestive heart failure.  The patient had been CT scan of the chest, abdomen pelvis performed recently and she is here for evaluation and discussion of her scan results.  MEDICAL HISTORY: Past Medical History:  Diagnosis Date  . Acute congestive heart failure ([place])   . Acute diastolic CHF (congestive heart failure) ([place]) 12/30/2016  . Acute respiratory failure with hypoxemia ([place]) 06/09/2016  . Acute respiratory failure with hypoxia ([place]) 12/30/2016  . Anginal pain ([place])   . ARF (acute renal failure) ([place]) 06/11/2016  . Arthritis   . Atrial fibrillation ([place]) 07/2012   Eliquis, rate controlled  . Benign hypertensive heart disease without heart failure 12/25/2012  . Breast cancer ([place]) 2001   left mastectomy  . Chest pain 12/11/2012  . CHF (congestive heart failure) ([place])  06/10/2016  . Chronic atrial fibrillation 11/16/2014  . Chronic diastolic heart failure ([place]) 02/13/2014  . Coarse tremors    , Essential  . Complication of anesthesia    SMALLER TUBE FOR INTUBATION AS GAGS ON TUBE  . COPD (chronic obstructive pulmonary disease) ([place])   . COPD exacerbation ([place]) 12/30/2016  . CVA (cerebral vascular accident) ([place]) 2013  . Dysrhythmia 08/20/2012   Atrial Fibrillation  . Essential and other specified forms of tremor 06/18/2012  . Essential hypertension 06/18/2012  . Family history of adverse reaction to anesthesia    mother experienced pain with intubation   . GERD (gastroesophageal reflux disease)    history of   . Hard of hearing   . Heart murmur   . History of chemotherapy 11/13/1998 to 2010   Adriamycin/Docotaxol, Dexorubicin/Taxotere, Femara, Tamoxifen  . History of CVA (cerebrovascular accident) 12/11/2012  . History of hiatal hernia    history of   . Hx of adenomatous colonic polyps 2006   , Benign  . Hypercholesteremia   . Hyperlipidemia 02/13/2014  . Hypertension    , With mild concentric left ventricular hypertrophy  . Long term current use of anticoagulant therapy 12/25/2012  . Mixed dyslipidemia   . Mixed hyperlipidemia 12/25/2012  . Morbid obesity ([place]) 06/18/2012  . Phlebitis and thrombophlebitis of superficial vessels of lower extremities 12/25/2012  . 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 Pure hypercholesterolemia 12/25/2012  . Shingles   . Shortness of breath dyspnea    on exertion   . SOB (shortness of breath) 11/16/2014  . Spider veins   . Stroke ([place]) 11/17/2011   left side  brain-speech-TPA  . Thyroid nodule   . Tremor, essential   . Unspecified cerebral artery occlusion with cerebral infarction 11/18/2011  . Valvular sclerosis 09/23/2003   without stenosis  . Varicose veins     ALLERGIES:  is allergic to floxin [ofloxacin].  MEDICATIONS:  Current Outpatient Medications  Medication Sig Dispense Refill  . albuterol  (PROVENTIL HFA;VENTOLIN HFA) 108 (90 Base) MCG/ACT inhaler Inhale 2 puffs into the lungs every 4 (four) hours as needed for wheezing or shortness of breath. 1 Inhaler 0  . atorvastatin (LIPITOR) 20 MG tablet Take 20 mg by mouth at bedtime.     Marland Kitchen ELIQUIS 5 MG TABS tablet TAKE 1 TABLET BY MOUTH TWICE A DAY 180 tablet 2  . fenofibrate 160 MG tablet Take 160 mg by mouth at bedtime.     . ferrous sulfate 325 (65 FE) MG EC tablet Take 325 mg by mouth at bedtime.     . fluticasone (FLONASE) 50 MCG/ACT nasal spray Place 2 sprays into both nostrils daily as needed for allergies or rhinitis.     . furosemide (LASIX) 40 MG tablet Take 1 tablet (40 mg total) by mouth daily. 90 tablet 3  . guaiFENesin (MUCINEX) 600 MG 12 hr tablet Take 600 mg by mouth daily.    Marland Kitchen ketoconazole (NIZORAL) 2 % cream Apply 1 application topically daily as needed for irritation (rash).   0  . levothyroxine (SYNTHROID, LEVOTHROID) 175 MCG tablet TAKE 1 TABLET BY MOUTH EVERY DAY BEFORE BREAKFAST 90 tablet 3  . loratadine (CLARITIN) 10 MG tablet Take 10 mg by mouth daily as needed for allergies (rash).     Marland Kitchen losartan (COZAAR) 100 MG tablet Take 100 mg by mouth daily.    . Multiple Vitamin (MULTIVITAMIN WITH MINERALS) TABS tablet Take 1 tablet by mouth daily at 2 PM.     . omeprazole (PRILOSEC) 20 MG capsule Take 20 mg by mouth daily at 2 PM.     . polyvinyl alcohol (ARTIFICIAL TEARS) 1.4 % ophthalmic solution Place 1 drop into both eyes daily as needed for dry eyes.    . potassium chloride (K-DUR) 10 MEQ tablet Take 1 tablet (10 mEq total) by mouth daily. 90 tablet 3  . propranolol ER (INDERAL LA) 60 MG 24 hr capsule TAKE 2 CAPS IN THE MORNING TAKE 1 CAP IN THE AFTERNOON, 1 CAP AT NIGHT 360 capsule 3  . Tiotropium Bromide Monohydrate (SPIRIVA RESPIMAT) 2.5 MCG/ACT AERS Inhale 2 puffs into the lungs daily. 3 Inhaler 4   No current facility-administered medications for this visit.     SURGICAL HISTORY:  Past Surgical History:   Procedure Laterality Date  . APPENDECTOMY  07/10/1957  . BIOPSY THYROID  12/01/2013   ultrasound  . BREAST SURGERY    . BUNIONECTOMY  11/29/1988   bilateral with hammer toes  . CARDIAC CATHETERIZATION  07/2010   , Without significant CAD  . CARDIAC CATHETERIZATION  07/2011   [name]  . CESAREAN SECTION    . CESAREAN SECTION  05/1968, 07/1965  . EYE SURGERY  01/31/2013   eye lid; cataract surgery bilat   . JOINT REPLACEMENT  05/2001   left knee  . JOINT REPLACEMENT  11/2001   right knee  . JOINT REPLACEMENT  01/2008   redo right knee  . KNEE SURGERY  2009   ,Total knee replacement  . LEFT HEART CATHETERIZATION WITH CORONARY ANGIOGRAM N/A 08/14/2011   Procedure: LEFT HEART CATHETERIZATION WITH CORONARY ANGIOGRAM;  Surgeon: [name], MD;  Location: [facility] CATH LAB;  Service: Cardiovascular;  Laterality: N/A;  . Left Rotator Cuff    . MASTECTOMY  10/19/1998   Radical, left -with lymph nodes (8 total)  . Reverse bunionectomy  08/29/1993   removal of Tibial Sesamoid-left  . TEE WITHOUT CARDIOVERSION  11/19/2011   Procedure: TRANSESOPHAGEAL ECHOCARDIOGRAM (TEE);  Surgeon: [name], MD;  Location: [facility] ENDOSCOPY;  Service: Cardiovascular;  Laterality: N/A;  . THYROIDECTOMY N/A 03/02/2015   Procedure: TOTAL THYROIDECTOMY;  Surgeon: Armandina Gemma, MD;  Location: [facility] ORS;  Service: General;  Laterality: N/A;  . TONSILLECTOMY    . TOTAL HIP ARTHROPLASTY Left 10/11/2014   Procedure: LEFT TOTAL HIP ARTHROPLASTY ANTERIOR APPROACH;  Surgeon: [name], MD;  Location: [facility] ORS;  Service: Orthopedics;  Laterality: Left;    REVIEW OF SYSTEMS:  A comprehensive review of systems was negative except for: Constitutional: positive for fatigue Respiratory: positive for dyspnea on exertion   PHYSICAL EXAMINATION: General appearance: alert, cooperative, fatigued and no distress Head: Normocephalic, without obvious abnormality, atraumatic Neck: no adenopathy, no JVD, supple, symmetrical, trachea  midline and thyroid not enlarged, symmetric, no tenderness/mass/nodules Lymph nodes: Cervical, supraclavicular, and axillary nodes normal. Resp: clear to auscultation bilaterally Back: symmetric, no curvature. ROM normal. No CVA tenderness. Cardio: regular rate and rhythm, S1, S2 normal, no murmur, click, rub or gallop GI: soft, non-tender; bowel sounds normal; no masses,  no organomegaly Extremities: extremities normal, atraumatic, no cyanosis or edema  ECOG PERFORMANCE STATUS: 1 - Symptomatic but completely ambulatory  Blood pressure 120/70, pulse 79, temperature 98.3 F (36.8 C), temperature source Oral, resp. rate 18, height '5\' 6"'  (1.676 m), weight 217 lb 8 oz (98.7 kg), SpO2 96 %.  LABORATORY DATA: Lab Results  Component Value Date   WBC 3.5 (L) 08/19/2018   HGB 10.8 (L) 08/19/2018   HCT 35.9 (L) 08/19/2018   MCV 93.5 08/19/2018   PLT 266 08/19/2018      Chemistry      Component Value Date/Time   NA 136 08/19/2018 0806   NA 142 05/21/2017 1027   NA 141 07/15/2016 0906   K 4.1 08/19/2018 0806   K 4.0 07/15/2016 0906   CL 98 08/19/2018 0806   CO2 27 08/19/2018 0806   CO2 25 07/15/2016 0906   BUN 53 (H) 08/19/2018 0806   BUN 33 (H) 05/21/2017 1027   BUN 20.7 07/15/2016 0906   CREATININE 1.69 (H) 08/19/2018 0806   CREATININE 1.1 07/15/2016 0906      Component Value Date/Time   CALCIUM 9.5 08/19/2018 0806   CALCIUM 9.6 07/15/2016 0906   ALKPHOS 31 (L) 08/19/2018 0806   ALKPHOS 29 (L) 07/15/2016 0906   AST 24 08/19/2018 0806   AST 19 07/15/2016 0906   ALT 12 08/19/2018 0806   ALT 13 07/15/2016 0906   BILITOT 0.9 08/19/2018 0806   BILITOT 1.13 07/15/2016 0906       RADIOGRAPHIC STUDIES: Ct Abdomen Pelvis Wo Contrast  Result Date: 08/19/2018 CLINICAL DATA:  Probable low-grade follicular lymphoma. Currently on observation. Remote history of left breast cancer with mastectomy. EXAM: CT CHEST, ABDOMEN AND PELVIS WITHOUT CONTRAST TECHNIQUE: Multidetector CT imaging  of the chest, abdomen and pelvis was performed following the standard protocol without IV contrast. COMPARISON:  01/20/2018 FINDINGS: CT CHEST FINDINGS Cardiovascular: Aortic and branch vessel atherosclerosis. Moderate cardiomegaly, without pericardial effusion. Multivessel coronary artery atherosclerosis. Mitral annular calcifications. Pulmonary artery enlargement, outflow tract 3.1 cm. Mediastinum/Nodes: Left supraclavicular adenopathy at 1.1 cm on image 5/2. Similar to on  the prior (when remeasured). Small bilateral subpectoral nodes.  No axillary adenopathy. Low right paratracheal node measures 1.4 cm on image 21/2 and is similar to on the prior exam (when remeasured). A precarinal node measures 1.9 cm on image 23/2 versus 1.6 cm on the prior. Subcarinal node measures 3.0 cm on image 28/2 versus 2.6 cm on the prior (when remeasured). low periesophageal nodal mass is similar at 2.9 cm on image 46/2. Lungs/Pleura: Small right pleural effusion is increased. Mild centrilobular emphysema. Mosaic attenuation throughout the lungs is mild and similar. Likely related to mosaic perfusion. Subpleural 3 mm left upper lobe pulmonary nodule on image 59/6 is not significantly changed. Bibasilar scarring. Musculoskeletal: Left mastectomy.  No acute osseous abnormality. CT ABDOMEN PELVIS FINDINGS Hepatobiliary: Hepatomegaly at 19.1 cm craniocaudal. Gallstones up to 9 mm without acute cholecystitis or biliary duct dilatation. Pancreas: Fatty replacement throughout the pancreas, without duct dilatation or acute inflammation. Spleen: Splenomegaly, 16.9 cm craniocaudal. Compare 16.0 cm on the prior exam (when remeasured). Greatest transverse dimension, 16.9 cm today versus 17.6 cm on the prior exam (when remeasured). Adrenals/Urinary Tract: Normal adrenal glands. No renal calculi or hydronephrosis. Degraded evaluation of the pelvis, secondary to beam hardening artifact from left hip arthroplasty. No gross bladder abnormality.  Stomach/Bowel: Normal stomach, without wall thickening. Extensive colonic diverticulosis. Normal terminal ileum. Normal small bowel. Vascular/Lymphatic: Aortic and branch vessel atherosclerosis. Diffuse retroperitoneal adenopathy, with a conglomerate of nodes measuring 3.0 cm short axis in the left periaortic space on image 75/2. Compare 3.3 cm on the prior exam (when remeasured). Contiguous aortocaval component measures 2.0 cm on image 77/2 versus 2.2 cm on the prior (when remeasured). No pelvic sidewall adenopathy. Reproductive: Dystrophic calcifications in the uterus are likely due to small fibroids. No adnexal mass. Other: No significant free fluid. No evidence of omental or peritoneal disease. Musculoskeletal: No acute osseous abnormality. Degenerate disc disease is most significant at L3-4. IMPRESSION: 1. Compared to the 01/20/2018, the tumor burden of presumed lymphoma is similar. Thoracic adenopathy is mildly increased, while abdominal adenopathy is improved. 2. Similar splenomegaly. 3. Increase in small right-sided pleural effusion. Resolution of pelvic fluid. 4. Coronary artery atherosclerosis. Aortic Atherosclerosis (ICD10-I70.0). Emphysema (ICD10-J43.9). 5. Pulmonary artery enlargement suggests pulmonary arterial hypertension. 6. Cholelithiasis 7. Similar probable mosaic perfusion, possibly related to air trapping. Electronically Signed   By: Abigail Miyamoto M.D.   On: 08/19/2018 14:07   Ct Chest Wo Contrast  Result Date: 08/19/2018 CLINICAL DATA:  Probable low-grade follicular lymphoma. Currently on observation. Remote history of left breast cancer with mastectomy. EXAM: CT CHEST, ABDOMEN AND PELVIS WITHOUT CONTRAST TECHNIQUE: Multidetector CT imaging of the chest, abdomen and pelvis was performed following the standard protocol without IV contrast. COMPARISON:  01/20/2018 FINDINGS: CT CHEST FINDINGS Cardiovascular: Aortic and branch vessel atherosclerosis. Moderate cardiomegaly, without pericardial  effusion. Multivessel coronary artery atherosclerosis. Mitral annular calcifications. Pulmonary artery enlargement, outflow tract 3.1 cm. Mediastinum/Nodes: Left supraclavicular adenopathy at 1.1 cm on image 5/2. Similar to on the prior (when remeasured). Small bilateral subpectoral nodes.  No axillary adenopathy. Low right paratracheal node measures 1.4 cm on image 21/2 and is similar to on the prior exam (when remeasured). A precarinal node measures 1.9 cm on image 23/2 versus 1.6 cm on the prior. Subcarinal node measures 3.0 cm on image 28/2 versus 2.6 cm on the prior (when remeasured). low periesophageal nodal mass is similar at 2.9 cm on image 46/2. Lungs/Pleura: Small right pleural effusion is increased. Mild centrilobular emphysema. Mosaic attenuation throughout the lungs  is mild and similar. Likely related to mosaic perfusion. Subpleural 3 mm left upper lobe pulmonary nodule on image 59/6 is not significantly changed. Bibasilar scarring. Musculoskeletal: Left mastectomy.  No acute osseous abnormality. CT ABDOMEN PELVIS FINDINGS Hepatobiliary: Hepatomegaly at 19.1 cm craniocaudal. Gallstones up to 9 mm without acute cholecystitis or biliary duct dilatation. Pancreas: Fatty replacement throughout the pancreas, without duct dilatation or acute inflammation. Spleen: Splenomegaly, 16.9 cm craniocaudal. Compare 16.0 cm on the prior exam (when remeasured). Greatest transverse dimension, 16.9 cm today versus 17.6 cm on the prior exam (when remeasured). Adrenals/Urinary Tract: Normal adrenal glands. No renal calculi or hydronephrosis. Degraded evaluation of the pelvis, secondary to beam hardening artifact from left hip arthroplasty. No gross bladder abnormality. Stomach/Bowel: Normal stomach, without wall thickening. Extensive colonic diverticulosis. Normal terminal ileum. Normal small bowel. Vascular/Lymphatic: Aortic and branch vessel atherosclerosis. Diffuse retroperitoneal adenopathy, with a conglomerate of nodes  measuring 3.0 cm short axis in the left periaortic space on image 75/2. Compare 3.3 cm on the prior exam (when remeasured). Contiguous aortocaval component measures 2.0 cm on image 77/2 versus 2.2 cm on the prior (when remeasured). No pelvic sidewall adenopathy. Reproductive: Dystrophic calcifications in the uterus are likely due to small fibroids. No adnexal mass. Other: No significant free fluid. No evidence of omental or peritoneal disease. Musculoskeletal: No acute osseous abnormality. Degenerate disc disease is most significant at L3-4. IMPRESSION: 1. Compared to the 01/20/2018, the tumor burden of presumed lymphoma is similar. Thoracic adenopathy is mildly increased, while abdominal adenopathy is improved. 2. Similar splenomegaly. 3. Increase in small right-sided pleural effusion. Resolution of pelvic fluid. 4. Coronary artery atherosclerosis. Aortic Atherosclerosis (ICD10-I70.0). Emphysema (ICD10-J43.9). 5. Pulmonary artery enlargement suggests pulmonary arterial hypertension. 6. Cholelithiasis 7. Similar probable mosaic perfusion, possibly related to air trapping. Electronically Signed   By: Abigail Miyamoto M.D.   On: 08/19/2018 14:07    ASSESSMENT AND PLAN:  This is a very pleasant 78 years old white female with persistent lymphadenopathy as well as mild hepatosplenomegaly suspicious for low-grade lymphoma. The patient is currently on observation. She had a repeat CT scan of the chest, abdomen and pelvis that showed further increase in the lymphadenopathy in the chest and retroperitoneum. The patient recently underwent CT-guided core biopsy of retroperitoneal lymph node in addition to bone marrow biopsy and aspirate.  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 The patient is currently on observation and she is feeling fine. She had repeat CT scan of the chest, abdomen pelvis performed recently.  I personally and independently reviewed the scan and  discussed the results with the patient and her daughter. Her scan showed no concerning findings for disease progression. I recommended for the patient to continue on observation with repeat blood work and follow-up visit in 6 months. For the renal insufficiency, this is likely secondary to diuretics.  I recommended for the patient to discuss with her cardiologist and primary care physician adjustment of her medications. She was advised to call immediately if she has any concerning symptoms in the interval. The patient voices understanding of current disease status and treatment options and is in agreement with the current care plan. All questions were answered. The patient knows to call the clinic with any problems, questions or concerns. We can certainly see the patient much sooner if necessary.  Disclaimer: This note was dictated with voice recognition software. Similar sounding words can inadvertently be transcribed and may not be corrected upon review.

## 2018-08-25 ENCOUNTER — Telehealth: Payer: Self-pay | Admitting: Internal Medicine

## 2018-08-25 DIAGNOSIS — J449 Chronic obstructive pulmonary disease, unspecified: Secondary | ICD-10-CM | POA: Diagnosis not present

## 2018-08-25 DIAGNOSIS — I509 Heart failure, unspecified: Secondary | ICD-10-CM | POA: Diagnosis not present

## 2018-08-25 DIAGNOSIS — I10 Essential (primary) hypertension: Secondary | ICD-10-CM | POA: Diagnosis not present

## 2018-08-25 DIAGNOSIS — M19041 Primary osteoarthritis, right hand: Secondary | ICD-10-CM | POA: Diagnosis not present

## 2018-08-25 DIAGNOSIS — E890 Postprocedural hypothyroidism: Secondary | ICD-10-CM | POA: Diagnosis not present

## 2018-08-25 DIAGNOSIS — Z853 Personal history of malignant neoplasm of breast: Secondary | ICD-10-CM | POA: Diagnosis not present

## 2018-08-25 DIAGNOSIS — E782 Mixed hyperlipidemia: Secondary | ICD-10-CM | POA: Diagnosis not present

## 2018-08-25 DIAGNOSIS — I4891 Unspecified atrial fibrillation: Secondary | ICD-10-CM | POA: Diagnosis not present

## 2018-08-25 DIAGNOSIS — I5032 Chronic diastolic (congestive) heart failure: Secondary | ICD-10-CM | POA: Diagnosis not present

## 2018-08-25 NOTE — Telephone Encounter (Signed)
Lab and f/u in 6 months - reminder letter mailed with appt date and time - 5/26 LOS

## 2018-09-23 ENCOUNTER — Other Ambulatory Visit: Payer: Self-pay | Admitting: Cardiology

## 2018-09-23 DIAGNOSIS — I4891 Unspecified atrial fibrillation: Secondary | ICD-10-CM | POA: Diagnosis not present

## 2018-09-23 DIAGNOSIS — J449 Chronic obstructive pulmonary disease, unspecified: Secondary | ICD-10-CM | POA: Diagnosis not present

## 2018-09-23 DIAGNOSIS — Z853 Personal history of malignant neoplasm of breast: Secondary | ICD-10-CM | POA: Diagnosis not present

## 2018-09-23 DIAGNOSIS — I5032 Chronic diastolic (congestive) heart failure: Secondary | ICD-10-CM | POA: Diagnosis not present

## 2018-09-23 DIAGNOSIS — E890 Postprocedural hypothyroidism: Secondary | ICD-10-CM | POA: Diagnosis not present

## 2018-09-23 DIAGNOSIS — I10 Essential (primary) hypertension: Secondary | ICD-10-CM | POA: Diagnosis not present

## 2018-09-23 DIAGNOSIS — I509 Heart failure, unspecified: Secondary | ICD-10-CM | POA: Diagnosis not present

## 2018-09-23 DIAGNOSIS — M19041 Primary osteoarthritis, right hand: Secondary | ICD-10-CM | POA: Diagnosis not present

## 2018-09-23 DIAGNOSIS — E782 Mixed hyperlipidemia: Secondary | ICD-10-CM | POA: Diagnosis not present

## 2018-09-23 MED ORDER — PROPRANOLOL HCL ER 60 MG PO CP24: ORAL_CAPSULE | ORAL | 3 refills | Status: DC

## 2018-09-23 NOTE — Telephone Encounter (Signed)
Pt's medication was resent to pt's requested pharmacy. Confirmation received.

## 2018-09-23 NOTE — Telephone Encounter (Signed)
New message    *STAT* If patient is at the pharmacy, call can be transferred to refill team.   1. Which medications need to be refilled? (please list name of each medication and dose if known)  propranolol ER (INDERAL LA) 60 MG 24 hr capsule     2. Which pharmacy/location (including street and city if local pharmacy) is medication to be sent to?[facility] - [place], [place] - [address]  3. Do they need a 30 day or 90 day supply? [place]

## 2018-10-05 ENCOUNTER — Other Ambulatory Visit (HOSPITAL_COMMUNITY): Payer: PPO

## 2018-10-18 DIAGNOSIS — Z853 Personal history of malignant neoplasm of breast: Secondary | ICD-10-CM | POA: Diagnosis not present

## 2018-10-18 DIAGNOSIS — I4891 Unspecified atrial fibrillation: Secondary | ICD-10-CM | POA: Diagnosis not present

## 2018-10-18 DIAGNOSIS — I10 Essential (primary) hypertension: Secondary | ICD-10-CM | POA: Diagnosis not present

## 2018-10-18 DIAGNOSIS — J449 Chronic obstructive pulmonary disease, unspecified: Secondary | ICD-10-CM | POA: Diagnosis not present

## 2018-10-18 DIAGNOSIS — E782 Mixed hyperlipidemia: Secondary | ICD-10-CM | POA: Diagnosis not present

## 2018-10-18 DIAGNOSIS — I5032 Chronic diastolic (congestive) heart failure: Secondary | ICD-10-CM | POA: Diagnosis not present

## 2018-10-18 DIAGNOSIS — M19041 Primary osteoarthritis, right hand: Secondary | ICD-10-CM | POA: Diagnosis not present

## 2018-10-23 ENCOUNTER — Encounter (HOSPITAL_COMMUNITY): Payer: Self-pay | Admitting: Emergency Medicine

## 2018-10-23 ENCOUNTER — Inpatient Hospital Stay (HOSPITAL_COMMUNITY)
Admission: EM | Admit: 2018-10-23 | Discharge: 2018-10-28 | DRG: 871 | Disposition: A | Discharge status: Home Health | Payer: PPO | Attending: Internal Medicine | Admitting: Internal Medicine

## 2018-10-23 ENCOUNTER — Other Ambulatory Visit: Payer: Self-pay

## 2018-10-23 DIAGNOSIS — K219 Gastro-esophageal reflux disease without esophagitis: Secondary | ICD-10-CM | POA: Diagnosis not present

## 2018-10-23 DIAGNOSIS — J441 Chronic obstructive pulmonary disease with (acute) exacerbation: Secondary | ICD-10-CM | POA: Diagnosis not present

## 2018-10-23 DIAGNOSIS — Z79899 Other long term (current) drug therapy: Secondary | ICD-10-CM

## 2018-10-23 DIAGNOSIS — C8580 Other specified types of non-Hodgkin lymphoma, unspecified site: Secondary | ICD-10-CM | POA: Diagnosis not present

## 2018-10-23 DIAGNOSIS — N12 Tubulo-interstitial nephritis, not specified as acute or chronic: Secondary | ICD-10-CM

## 2018-10-23 DIAGNOSIS — K5730 Diverticulosis of large intestine without perforation or abscess without bleeding: Secondary | ICD-10-CM | POA: Diagnosis not present

## 2018-10-23 DIAGNOSIS — I5032 Chronic diastolic (congestive) heart failure: Secondary | ICD-10-CM | POA: Diagnosis present

## 2018-10-23 DIAGNOSIS — Z853 Personal history of malignant neoplasm of breast: Secondary | ICD-10-CM

## 2018-10-23 DIAGNOSIS — N390 Urinary tract infection, site not specified: Secondary | ICD-10-CM | POA: Diagnosis not present

## 2018-10-23 DIAGNOSIS — I482 Chronic atrial fibrillation, unspecified: Secondary | ICD-10-CM | POA: Diagnosis present

## 2018-10-23 DIAGNOSIS — Z85850 Personal history of malignant neoplasm of thyroid: Secondary | ICD-10-CM

## 2018-10-23 DIAGNOSIS — Z8673 Personal history of transient ischemic attack (TIA), and cerebral infarction without residual deficits: Secondary | ICD-10-CM

## 2018-10-23 DIAGNOSIS — I472 Ventricular tachycardia: Secondary | ICD-10-CM | POA: Diagnosis present

## 2018-10-23 DIAGNOSIS — E782 Mixed hyperlipidemia: Secondary | ICD-10-CM | POA: Diagnosis present

## 2018-10-23 DIAGNOSIS — N183 Chronic kidney disease, stage 3 unspecified: Secondary | ICD-10-CM | POA: Diagnosis present

## 2018-10-23 DIAGNOSIS — A4151 Sepsis due to Escherichia coli [E. coli]: Principal | ICD-10-CM | POA: Diagnosis present

## 2018-10-23 DIAGNOSIS — K8020 Calculus of gallbladder without cholecystitis without obstruction: Secondary | ICD-10-CM | POA: Diagnosis not present

## 2018-10-23 DIAGNOSIS — Z87891 Personal history of nicotine dependence: Secondary | ICD-10-CM | POA: Diagnosis not present

## 2018-10-23 DIAGNOSIS — J449 Chronic obstructive pulmonary disease, unspecified: Secondary | ICD-10-CM | POA: Diagnosis present

## 2018-10-23 DIAGNOSIS — I5033 Acute on chronic diastolic (congestive) heart failure: Secondary | ICD-10-CM | POA: Diagnosis present

## 2018-10-23 DIAGNOSIS — Z950 Presence of cardiac pacemaker: Secondary | ICD-10-CM | POA: Diagnosis not present

## 2018-10-23 DIAGNOSIS — Z7951 Long term (current) use of inhaled steroids: Secondary | ICD-10-CM | POA: Diagnosis not present

## 2018-10-23 DIAGNOSIS — Z79890 Hormone replacement therapy (postmenopausal): Secondary | ICD-10-CM

## 2018-10-23 DIAGNOSIS — Z7901 Long term (current) use of anticoagulants: Secondary | ICD-10-CM | POA: Diagnosis not present

## 2018-10-23 DIAGNOSIS — I10 Essential (primary) hypertension: Secondary | ICD-10-CM | POA: Diagnosis present

## 2018-10-23 DIAGNOSIS — A419 Sepsis, unspecified organism: Secondary | ICD-10-CM

## 2018-10-23 DIAGNOSIS — Z9012 Acquired absence of left breast and nipple: Secondary | ICD-10-CM | POA: Diagnosis not present

## 2018-10-23 DIAGNOSIS — Z96642 Presence of left artificial hip joint: Secondary | ICD-10-CM | POA: Diagnosis not present

## 2018-10-23 DIAGNOSIS — N10 Acute tubulo-interstitial nephritis: Secondary | ICD-10-CM | POA: Diagnosis present

## 2018-10-23 DIAGNOSIS — Z9221 Personal history of antineoplastic chemotherapy: Secondary | ICD-10-CM | POA: Diagnosis not present

## 2018-10-23 DIAGNOSIS — R319 Hematuria, unspecified: Secondary | ICD-10-CM | POA: Diagnosis not present

## 2018-10-23 DIAGNOSIS — I4891 Unspecified atrial fibrillation: Secondary | ICD-10-CM | POA: Diagnosis not present

## 2018-10-23 DIAGNOSIS — E890 Postprocedural hypothyroidism: Secondary | ICD-10-CM | POA: Diagnosis present

## 2018-10-23 DIAGNOSIS — E039 Hypothyroidism, unspecified: Secondary | ICD-10-CM | POA: Diagnosis present

## 2018-10-23 DIAGNOSIS — Z96653 Presence of artificial knee joint, bilateral: Secondary | ICD-10-CM | POA: Diagnosis present

## 2018-10-23 DIAGNOSIS — I350 Nonrheumatic aortic (valve) stenosis: Secondary | ICD-10-CM | POA: Diagnosis not present

## 2018-10-23 DIAGNOSIS — G250 Essential tremor: Secondary | ICD-10-CM | POA: Diagnosis present

## 2018-10-23 DIAGNOSIS — E785 Hyperlipidemia, unspecified: Secondary | ICD-10-CM | POA: Diagnosis not present

## 2018-10-23 DIAGNOSIS — C8290 Follicular lymphoma, unspecified, unspecified site: Secondary | ICD-10-CM | POA: Diagnosis not present

## 2018-10-23 DIAGNOSIS — I130 Hypertensive heart and chronic kidney disease with heart failure and stage 1 through stage 4 chronic kidney disease, or unspecified chronic kidney disease: Secondary | ICD-10-CM | POA: Diagnosis not present

## 2018-10-23 DIAGNOSIS — Z20828 Contact with and (suspected) exposure to other viral communicable diseases: Secondary | ICD-10-CM | POA: Diagnosis present

## 2018-10-23 DIAGNOSIS — R509 Fever, unspecified: Secondary | ICD-10-CM | POA: Diagnosis not present

## 2018-10-23 DIAGNOSIS — R0602 Shortness of breath: Secondary | ICD-10-CM | POA: Diagnosis not present

## 2018-10-23 LAB — URINALYSIS, ROUTINE W REFLEX MICROSCOPIC
Bilirubin Urine: NEGATIVE
Glucose, UA: NEGATIVE mg/dL
Ketones, ur: NEGATIVE mg/dL
Nitrite: NEGATIVE
Protein, ur: 100 mg/dL — AB
RBC / HPF: >50 RBC/hpf — ABNORMAL HIGH (ref 0–5)
Specific Gravity, Urine: 1.016 (ref 1.005–1.030)
WBC, UA: >50 WBC/hpf — ABNORMAL HIGH (ref 0–5)
pH: 5 (ref 5.0–8.0)

## 2018-10-23 LAB — COMPREHENSIVE METABOLIC PANEL
ALT: 17 U/L (ref 0–44)
AST: 40 U/L (ref 15–41)
Albumin: 3.3 g/dL — ABNORMAL LOW (ref 3.5–5.0)
Alkaline Phosphatase: 37 U/L — ABNORMAL LOW (ref 38–126)
Anion gap: 11 (ref 5–15)
BUN: 41 mg/dL — ABNORMAL HIGH (ref 8–23)
CO2: 24 mmol/L (ref 22–32)
Calcium: 9.2 mg/dL (ref 8.9–10.3)
Chloride: 98 mmol/L (ref 98–111)
Creatinine, Ser: 1.57 mg/dL — ABNORMAL HIGH (ref 0.44–1.00)
GFR calc Af Amer: 36 mL/min — ABNORMAL LOW (ref >60)
GFR calc non Af Amer: 31 mL/min — ABNORMAL LOW (ref >60)
Glucose, Bld: 142 mg/dL — ABNORMAL HIGH (ref 70–99)
Potassium: 4.3 mmol/L (ref 3.5–5.1)
Sodium: 133 mmol/L — ABNORMAL LOW (ref 135–145)
Total Bilirubin: 1.6 mg/dL — ABNORMAL HIGH (ref 0.3–1.2)
Total Protein: 8.6 g/dL — ABNORMAL HIGH (ref 6.5–8.1)

## 2018-10-23 LAB — CBC WITH DIFFERENTIAL/PLATELET
Abs Immature Granulocytes: 0.04 10*3/uL (ref 0.00–0.07)
Basophils Absolute: 0 10*3/uL (ref 0.0–0.1)
Basophils Relative: 0 %
Eosinophils Absolute: 0 10*3/uL (ref 0.0–0.5)
Eosinophils Relative: 0 %
HCT: 35.8 % — ABNORMAL LOW (ref 36.0–46.0)
Hemoglobin: 11.5 g/dL — ABNORMAL LOW (ref 12.0–15.0)
Immature Granulocytes: 1 %
Lymphocytes Relative: 3 %
Lymphs Abs: 0.2 10*3/uL — ABNORMAL LOW (ref 0.7–4.0)
MCH: 29.3 pg (ref 26.0–34.0)
MCHC: 32.1 g/dL (ref 30.0–36.0)
MCV: 91.1 fL (ref 80.0–100.0)
Monocytes Absolute: 0.7 10*3/uL (ref 0.1–1.0)
Monocytes Relative: 9 %
Neutro Abs: 6.5 10*3/uL (ref 1.7–7.7)
Neutrophils Relative %: 87 %
Platelets: 279 10*3/uL (ref 150–400)
RBC: 3.93 MIL/uL (ref 3.87–5.11)
RDW: 14.7 % (ref 11.5–15.5)
WBC: 7.4 10*3/uL (ref 4.0–10.5)
nRBC: 0 % (ref 0.0–0.2)

## 2018-10-23 LAB — PROTIME-INR
INR: 1.6 — ABNORMAL HIGH (ref 0.8–1.2)
Prothrombin Time: 18.6 seconds — ABNORMAL HIGH (ref 11.4–15.2)

## 2018-10-23 MED ORDER — SODIUM CHLORIDE 0.9 % IV SOLN
1.0000 g | Freq: Once | INTRAVENOUS | Status: AC
  Administered 2018-10-24: 1 g via INTRAVENOUS
  Filled 2018-10-23: qty 10

## 2018-10-23 MED ORDER — ACETAMINOPHEN 325 MG PO TABS
650.0000 mg | ORAL_TABLET | Freq: Once | ORAL | Status: AC
  Administered 2018-10-23: 650 mg via ORAL
  Filled 2018-10-23: qty 2

## 2018-10-23 NOTE — ED Triage Notes (Signed)
Pt here for eval of fever chills shortness of breath since yesterday. Pt tachycardic and febrile, recent trip to [place]. Pt states "It is not infection it is my heart failure." Sepsis order set initiated.

## 2018-10-23 NOTE — ED Notes (Signed)
[place] pts son wants to be called by RN for update

## 2018-10-23 NOTE — ED Provider Notes (Signed)
[facility] EMERGENCY DEPARTMENT Provider Note   CSN: [id number] Arrival date & time: 10/23/18  2138     History   Chief Complaint Chief Complaint  Patient presents with  . Fever    HPI Kristin Hood is a 78 y.o. female with a history of chronic diastolic congestive heart failure, A. fib on Eliquis, COPD, and a stage IV low-grade nodular lymphoma, and left breast cancer s/p mastectomy who presents to the emergency department with a chief complaint of fever.  The patient endorses fever and chills that have been intermittent since onset yesterday.  T-max at home was 102 yesterday afternoon.  The patient reports that she took 2 tablets of Tylenol around 1700.  She reports associated bilateral flank pain, onset today, a right-sided posterior headache, shortness of breath, and a productive cough with clear sputum.  She denies chest pain, palpitations, neck pain or stiffness, abdominal pain, nausea, vomiting, diarrhea, dysuria, hematuria, urinary frequency or hesitancy, or leg swelling.  She reports that she has been compliant with her home Lasix and her heart failure has been well controlled.  She lives at home with family, but reports her family is out of town for a weekend.  She was supposed to accompany family, but stayed home due to not feeling well.  EF of 55 to 60% on echo in March 2018.     The history is provided by the patient. No language interpreter was used.    Past Medical History:  Diagnosis Date  . Acute congestive heart failure ([place])   . Acute diastolic CHF (congestive heart failure) ([place]) 12/30/2016  . Acute respiratory failure with hypoxemia ([place]) 06/09/2016  . Acute respiratory failure with hypoxia ([place]) 12/30/2016  . Anginal pain ([place])   . ARF (acute renal failure) ([place]) 06/11/2016  . Arthritis   . Atrial fibrillation ([place]) 07/2012   Eliquis, rate controlled  . Benign hypertensive heart disease without heart failure 12/25/2012  . Breast cancer  ([place]) 2001   left mastectomy  . Chest pain 12/11/2012  . CHF (congestive heart failure) ([place]) 06/10/2016  . Chronic atrial fibrillation 11/16/2014  . Chronic diastolic heart failure ([place]) 02/13/2014  . Coarse tremors    , Essential  . Complication of anesthesia    SMALLER TUBE FOR INTUBATION AS GAGS ON TUBE  . COPD (chronic obstructive pulmonary disease) ([place])   . COPD exacerbation ([place]) 12/30/2016  . CVA (cerebral vascular accident) ([place]) 2013  . Dysrhythmia 08/20/2012   Atrial Fibrillation  . Essential and other specified forms of tremor 06/18/2012  . Essential hypertension 06/18/2012  . Family history of adverse reaction to anesthesia    mother experienced pain with intubation   . GERD (gastroesophageal reflux disease)    history of   . Hard of hearing   . Heart murmur   . History of chemotherapy 11/13/1998 to 2010   Adriamycin/Docotaxol, Dexorubicin/Taxotere, Femara, Tamoxifen  . History of CVA (cerebrovascular accident) 12/11/2012  . History of hiatal hernia    history of   . Hx of adenomatous colonic polyps 2006   , Benign  . Hypercholesteremia   . Hyperlipidemia 02/13/2014  . Hypertension    , With mild concentric left ventricular hypertrophy  . Long term current use of anticoagulant therapy 12/25/2012  . Mixed dyslipidemia   . Mixed hyperlipidemia 12/25/2012  . Morbid obesity ([place]) 06/18/2012  . Phlebitis and thrombophlebitis of superficial vessels of lower extremities 12/25/2012  . 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 Pure hypercholesterolemia 12/25/2012  .  Shingles   . Shortness of breath dyspnea    on exertion   . SOB (shortness of breath) 11/16/2014  . Spider veins   . Stroke ([place]) 11/17/2011   left side brain-speech-TPA  . Thyroid nodule   . Tremor, essential   . Unspecified cerebral artery occlusion with cerebral infarction 11/18/2011  . Valvular sclerosis 09/23/2003   without stenosis  . Varicose veins     Patient Active Problem List   Diagnosis Date Noted   . Acute CHF (congestive heart failure) ([place]) 05/09/2018  . 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 Trochanteric bursitis of right hip 07/06/2017  . Spinal stenosis of lumbar region 06/15/2017  . Acute respiratory failure with hypoxia ([place]) 12/30/2016  . Acute diastolic CHF (congestive heart failure) ([place]) 12/30/2016  . COPD exacerbation ([place]) 12/30/2016  . Allergic rhinitis 12/09/2016  . ARF (acute renal failure) ([place]) 06/11/2016  . CHF (congestive heart failure) ([place]) 06/10/2016  . Acute respiratory failure with hypoxemia ([place]) 06/09/2016  . Pulmonary nodules   . Acute congestive heart failure ([place])   . Hoarseness 06/04/2015  . 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 COPD (chronic obstructive pulmonary disease) ([place]) 12/28/2014  . Abnormal chest CT 12/07/2014  . Chronic atrial fibrillation ([place]) 11/16/2014  . SOB (shortness of breath) 11/16/2014  . Hypertension 11/16/2014  . OA (osteoarthritis) of hip 10/11/2014  . Lymphadenopathy 07/17/2014  . Hyperlipidemia 02/13/2014  . Chronic diastolic heart failure ([place]) 02/13/2014  . Impaired fasting glucose 12/25/2012  . Mixed hyperlipidemia 12/25/2012  . Pure hypercholesterolemia 12/25/2012  . Benign hypertensive heart disease without heart failure 12/25/2012  . Phlebitis and thrombophlebitis of superficial vessels of lower extremities 12/25/2012  . Abnormal involuntary movements(781.0) 12/25/2012  . Long term current use of anticoagulant therapy 12/25/2012  . Chest pain 12/11/2012  . Atrial fibrillation ([place]) 12/11/2012  . History of CVA (cerebrovascular accident) 12/11/2012  . Morbid obesity ([place]) 06/18/2012  . Other and unspecified hyperlipidemia 06/18/2012  . Essential hypertension 06/18/2012  . Essential and other specified forms of tremor 06/18/2012  . Stroke ([place]) 11/19/2011  . Unspecified cerebral artery occlusion with cerebral infarction 11/18/2011  .  Morbid obesity with BMI of 45.0-49.9, adult ([place]) 11/18/2011    Past Surgical History:  Procedure Laterality Date  . APPENDECTOMY  07/10/1957  . BIOPSY THYROID  12/01/2013   ultrasound  . BREAST SURGERY    . BUNIONECTOMY  11/29/1988   bilateral with hammer toes  . CARDIAC CATHETERIZATION  07/2010   , Without significant CAD  . CARDIAC CATHETERIZATION  07/2011   [name]  . CESAREAN SECTION    . CESAREAN SECTION  05/1968, 07/1965  . EYE SURGERY  01/31/2013   eye lid; cataract surgery bilat   . JOINT REPLACEMENT  05/2001   left knee  . JOINT REPLACEMENT  11/2001   right knee  . JOINT REPLACEMENT  01/2008   redo right knee  . KNEE SURGERY  2009   ,Total knee replacement  . LEFT HEART CATHETERIZATION WITH CORONARY ANGIOGRAM N/A 08/14/2011   Procedure: LEFT HEART CATHETERIZATION WITH CORONARY ANGIOGRAM;  Surgeon: [name], MD;  Location: [facility] CATH LAB;  Service: Cardiovascular;  Laterality: N/A;  . Left Rotator Cuff    . MASTECTOMY  10/19/1998   Radical, left -with lymph nodes (8 total)  . Reverse bunionectomy  08/29/1993   removal of Tibial Sesamoid-left  . TEE WITHOUT CARDIOVERSION  11/19/2011   Procedure: TRANSESOPHAGEAL ECHOCARDIOGRAM (TEE);  Surgeon: [name], MD;  Location: [facility] ENDOSCOPY;  Service: Cardiovascular;  Laterality: N/A;  . THYROIDECTOMY N/A 03/02/2015   Procedure: TOTAL THYROIDECTOMY;  Surgeon: Armandina Gemma, MD;  Location: [facility] ORS;  Service: General;  Laterality: N/A;  . TONSILLECTOMY    . TOTAL HIP ARTHROPLASTY Left 10/11/2014   Procedure: LEFT TOTAL HIP ARTHROPLASTY ANTERIOR APPROACH;  Surgeon: [name], MD;  Location: [facility] ORS;  Service: Orthopedics;  Laterality: Left;     OB History   No obstetric history on file.      Home Medications    Prior to Admission medications   Medication Sig Start Date End Date Taking? Authorizing Provider  albuterol (PROVENTIL HFA;VENTOLIN HFA) 108 (90 Base) MCG/ACT inhaler Inhale 2 puffs into the lungs every 4  (four) hours as needed for wheezing or shortness of breath. 02/24/18   Collene Gobble, MD  atorvastatin (LIPITOR) 20 MG tablet Take 20 mg by mouth at bedtime.     [provider]  ELIQUIS 5 MG TABS tablet TAKE 1 TABLET BY MOUTH TWICE A DAY 07/13/18   Jerline Pain, MD  fenofibrate 160 MG tablet Take 160 mg by mouth at bedtime.     [provider]  ferrous sulfate 325 (65 FE) MG EC tablet Take 325 mg by mouth at bedtime.     [provider]  fluticasone (FLONASE) 50 MCG/ACT nasal spray Place 2 sprays into both nostrils daily as needed for allergies or rhinitis.     [provider]  furosemide (LASIX) 40 MG tablet Take 1 tablet (40 mg total) by mouth daily. 06/04/18   Jerline Pain, MD  guaiFENesin (MUCINEX) 600 MG 12 hr tablet Take 600 mg by mouth daily.    [provider]  ketoconazole (NIZORAL) 2 % cream Apply 1 application topically daily as needed for irritation (rash).  01/24/15   [provider]  levothyroxine (SYNTHROID, LEVOTHROID) 175 MCG tablet TAKE 1 TABLET BY MOUTH EVERY DAY BEFORE BREAKFAST 02/15/18   Philemon Kingdom, MD  loratadine (CLARITIN) 10 MG tablet Take 10 mg by mouth daily as needed for allergies (rash).     [provider]  losartan (COZAAR) 100 MG tablet Take 100 mg by mouth daily.    [provider]  Multiple Vitamin (MULTIVITAMIN WITH MINERALS) TABS tablet Take 1 tablet by mouth daily at 2 PM.     [provider]  omeprazole (PRILOSEC) 20 MG capsule Take 20 mg by mouth daily at 2 PM.     [provider]  polyvinyl alcohol (ARTIFICIAL TEARS) 1.4 % ophthalmic solution Place 1 drop into both eyes daily as needed for dry eyes.    [provider]  potassium chloride (K-DUR) 10 MEQ tablet Take 1 tablet (10 mEq total) by mouth daily. 06/04/18   Jerline Pain, MD  propranolol ER (INDERAL LA) 60 MG 24 hr capsule TAKE 2 CAPS IN THE MORNING TAKE 1 CAP IN THE AFTERNOON, 1 CAP AT NIGHT  09/23/18   Jerline Pain, MD  Tiotropium Bromide Monohydrate (SPIRIVA RESPIMAT) 2.5 MCG/ACT AERS Inhale 2 puffs into the lungs daily. 08/10/17   Collene Gobble, MD    Family History Family History  Problem Relation Age of Onset  . Heart disease Mother   . Aortic stenosis Mother   . Heart disease Father   . Heart failure Father   . Heart attack Father     Social History Social History   Tobacco Use  . Smoking status: Former  Smoker    Packs/day: 2.00    Years: 30.00    Pack years: 60.00    Types: Cigarettes    Quit date: 01/30/1992    Years since quitting: 26.7  . Smokeless tobacco: Never Used  Substance Use Topics  . Alcohol use: Yes    Alcohol/week: 1.0 standard drinks    Types: 1 Standard drinks or equivalent per week    Comment: wine occassionally  . Drug use: No     Allergies   Floxin [ofloxacin]   Review of Systems Review of Systems  Constitutional: Positive for chills and fever. Negative for activity change.       Malaise  HENT: Negative for congestion and sore throat.   Eyes: Negative for visual disturbance.  Respiratory: Positive for cough and shortness of breath. Negative for wheezing.   Cardiovascular: Negative for chest pain, palpitations and leg swelling.  Gastrointestinal: Negative for abdominal pain, blood in stool, diarrhea, nausea and vomiting.  Genitourinary: Positive for flank pain. Negative for dysuria, hematuria and urgency.  Musculoskeletal: Negative for back pain, myalgias, neck pain and neck stiffness.  Skin: Negative for rash.  Allergic/Immunologic: Negative for immunocompromised state.  Neurological: Positive for headaches. Negative for dizziness, syncope, weakness, light-headedness and numbness.  Psychiatric/Behavioral: Negative for confusion.     Physical Exam Updated Vital Signs BP 139/74 (BP Location: Right Arm)   Pulse 61   Temp (!) 101.7 F (38.7 C) (Oral)   Resp 16   SpO2 95%   Physical Exam Vitals signs and nursing note  reviewed.  Constitutional:      General: She is not in acute distress.    Appearance: She is not ill-appearing, toxic-appearing or diaphoretic.  HENT:     Head: Normocephalic.  Eyes:     Conjunctiva/sclera: Conjunctivae normal.  Neck:     Musculoskeletal: Normal range of motion and neck supple.  Cardiovascular:     Rate and Rhythm: Tachycardia present. Rhythm irregularly irregular.     Heart sounds: No murmur. No friction rub. No gallop.   Pulmonary:     Effort: Pulmonary effort is normal. No respiratory distress.  Abdominal:     General: There is no distension.     Palpations: Abdomen is soft. There is no mass.     Tenderness: There is no abdominal tenderness. There is right CVA tenderness and left CVA tenderness. There is no guarding or rebound.     Hernia: No hernia is present.     Comments: Tender to palpation over the bilateral CVA.  Abdomen is soft, nontender, nondistended.  Musculoskeletal:     Right lower leg: No edema.     Left lower leg: No edema.     Comments: Varicosities noted in the bilateral lower extremities.  No lower extremity edema.  Skin:    General: Skin is warm.     Findings: No rash.  Neurological:     Mental Status: She is alert.  Psychiatric:        Behavior: Behavior normal.      ED Treatments / Results  Labs (all labs ordered are listed, but only abnormal results are displayed) Labs Reviewed  COMPREHENSIVE METABOLIC PANEL - Abnormal; Notable for the following components:      Result Value   Sodium 133 (*)    Glucose, Bld 142 (*)    BUN 41 (*)    Creatinine, Ser 1.57 (*)    Total Protein 8.6 (*)    Albumin 3.3 (*)    Alkaline Phosphatase 37 (*)  Total Bilirubin 1.6 (*)    GFR calc non Af Amer 31 (*)    GFR calc Af Amer 36 (*)    All other components within normal limits  CBC WITH DIFFERENTIAL/PLATELET - Abnormal; Notable for the following components:   Hemoglobin 11.5 (*)    HCT 35.8 (*)    Lymphs Abs 0.2 (*)    All other components  within normal limits  PROTIME-INR - Abnormal; Notable for the following components:   Prothrombin Time 18.6 (*)    INR 1.6 (*)    All other components within normal limits  URINALYSIS, ROUTINE W REFLEX MICROSCOPIC - Abnormal; Notable for the following components:   Color, Urine AMBER (*)    APPearance CLOUDY (*)    Hgb urine dipstick LARGE (*)    Protein, ur 100 (*)    Leukocytes,Ua MODERATE (*)    RBC / HPF >50 (*)    WBC, UA >50 (*)    Bacteria, UA MANY (*)    All other components within normal limits  CULTURE, BLOOD (ROUTINE X 2)  CULTURE, BLOOD (ROUTINE X 2)  SARS CORONAVIRUS 2 (HOSPITAL ORDER, [place] LAB)  LACTIC ACID, PLASMA  LACTIC ACID, PLASMA    EKG None  Radiology No results found.  Procedures .Critical Care Performed by: Joanne Gavel, PA-C Authorized by: Joanne Gavel, PA-C   Critical care provider statement:    Critical care time (minutes):  35   Critical care time was exclusive of:  Separately billable procedures and treating other patients and teaching time   Critical care was necessary to treat or prevent imminent or life-threatening deterioration of the following conditions:  Sepsis   Critical care was time spent personally by me on the following activities:  Ordering and performing treatments and interventions, ordering and review of laboratory studies, ordering and review of radiographic studies, re-evaluation of patient's condition, pulse oximetry, evaluation of patient's response to treatment, examination of patient, development of treatment plan with patient or surrogate and obtaining history from patient or surrogate   (including critical care time)  Medications Ordered in ED Medications - No data to display   Initial Impression / Assessment and Plan / ED Course  I have reviewed the triage vital signs and the nursing notes.  Pertinent labs & imaging results that were available during my care of the patient were  reviewed by me and considered in my medical decision making (see chart for details).        78 year old female with a history of chronic diastolic congestive heart failure, A. fib on Eliquis, COPD, and a stage IV low-grade nodular lymphoma, and left breast cancer s/p mastectomy presenting with intermittent fever and chills, onset yesterday.  She has been having bilateral flank pain, productive cough with clear sputum, and shortness of breath.  Febrile to 101.7 on arrival and tachycardic.  She has no tachypnea or hypoxia. WBC is increased from previous, but no leukocytosis.  Lactate is normal.  She meets sepsis criteria.  However, since lactate is not elevated greater than 4 and given her history of heart failure, she does not require 30 cc/kg bolus of IV fluids.  Urine is concerning for infection.  No previous urine culture on file.  Will prophylactically start the patient on Rocephin.  The patient has no history of kidney stones. CXR with basilar airspace opacities, unchanged from previous, concerning for scarring versus atelectasis.  COVID-19 test is negative.  Patient seen and independently evaluated by Dr.  Cardama, attending physician who is in agreement with the work-up and plan.  I spoke with the patient's daughter [name] after I was unable to contact her son Rush Landmark to update family on the patient's care while in the ER.  Consult to the hospitalist team and spoke with Dr. Blaine Hamper to admit the patient for sepsis secondary to pyelonephritis.  Dr. Blaine Hamper has accepted the patient for admission. The patient appears reasonably stabilized for admission considering the current resources, flow, and capabilities available in the ED at this time, and I doubt any other [facility] requiring further screening and/or treatment in the ED prior to admission.   Final Clinical Impressions(s) / ED Diagnoses   Final diagnoses:  None    ED Discharge Orders    None       McDonald, Mia A, PA-C 10/24/18 0100    Fatima Blank, MD 10/24/18 [phone]

## 2018-10-23 NOTE — ED Notes (Signed)
Son Kristin Hood would like update [phone]

## 2018-10-24 ENCOUNTER — Encounter (HOSPITAL_COMMUNITY): Payer: Self-pay

## 2018-10-24 ENCOUNTER — Inpatient Hospital Stay (HOSPITAL_COMMUNITY): Payer: PPO

## 2018-10-24 ENCOUNTER — Emergency Department (HOSPITAL_COMMUNITY): Payer: PPO

## 2018-10-24 DIAGNOSIS — J449 Chronic obstructive pulmonary disease, unspecified: Secondary | ICD-10-CM | POA: Diagnosis not present

## 2018-10-24 DIAGNOSIS — E782 Mixed hyperlipidemia: Secondary | ICD-10-CM | POA: Diagnosis present

## 2018-10-24 DIAGNOSIS — C8580 Other specified types of non-Hodgkin lymphoma, unspecified site: Secondary | ICD-10-CM

## 2018-10-24 DIAGNOSIS — Z950 Presence of cardiac pacemaker: Secondary | ICD-10-CM | POA: Diagnosis not present

## 2018-10-24 DIAGNOSIS — A4151 Sepsis due to Escherichia coli [E. coli]: Secondary | ICD-10-CM | POA: Diagnosis present

## 2018-10-24 DIAGNOSIS — N10 Acute tubulo-interstitial nephritis: Secondary | ICD-10-CM | POA: Diagnosis present

## 2018-10-24 DIAGNOSIS — I350 Nonrheumatic aortic (valve) stenosis: Secondary | ICD-10-CM | POA: Diagnosis not present

## 2018-10-24 DIAGNOSIS — I4891 Unspecified atrial fibrillation: Secondary | ICD-10-CM

## 2018-10-24 DIAGNOSIS — K219 Gastro-esophageal reflux disease without esophagitis: Secondary | ICD-10-CM | POA: Diagnosis present

## 2018-10-24 DIAGNOSIS — N183 Chronic kidney disease, stage 3 unspecified: Secondary | ICD-10-CM | POA: Diagnosis present

## 2018-10-24 DIAGNOSIS — I482 Chronic atrial fibrillation, unspecified: Secondary | ICD-10-CM | POA: Diagnosis present

## 2018-10-24 DIAGNOSIS — Z96653 Presence of artificial knee joint, bilateral: Secondary | ICD-10-CM | POA: Diagnosis present

## 2018-10-24 DIAGNOSIS — I5033 Acute on chronic diastolic (congestive) heart failure: Secondary | ICD-10-CM | POA: Diagnosis present

## 2018-10-24 DIAGNOSIS — R509 Fever, unspecified: Secondary | ICD-10-CM | POA: Diagnosis present

## 2018-10-24 DIAGNOSIS — Z853 Personal history of malignant neoplasm of breast: Secondary | ICD-10-CM | POA: Diagnosis not present

## 2018-10-24 DIAGNOSIS — Z7901 Long term (current) use of anticoagulants: Secondary | ICD-10-CM

## 2018-10-24 DIAGNOSIS — E890 Postprocedural hypothyroidism: Secondary | ICD-10-CM | POA: Diagnosis present

## 2018-10-24 DIAGNOSIS — I472 Ventricular tachycardia: Secondary | ICD-10-CM | POA: Diagnosis present

## 2018-10-24 DIAGNOSIS — Z7951 Long term (current) use of inhaled steroids: Secondary | ICD-10-CM | POA: Diagnosis not present

## 2018-10-24 DIAGNOSIS — Z96642 Presence of left artificial hip joint: Secondary | ICD-10-CM | POA: Diagnosis present

## 2018-10-24 DIAGNOSIS — I10 Essential (primary) hypertension: Secondary | ICD-10-CM

## 2018-10-24 DIAGNOSIS — Z79899 Other long term (current) drug therapy: Secondary | ICD-10-CM | POA: Diagnosis not present

## 2018-10-24 DIAGNOSIS — Z20828 Contact with and (suspected) exposure to other viral communicable diseases: Secondary | ICD-10-CM | POA: Diagnosis present

## 2018-10-24 DIAGNOSIS — I130 Hypertensive heart and chronic kidney disease with heart failure and stage 1 through stage 4 chronic kidney disease, or unspecified chronic kidney disease: Secondary | ICD-10-CM | POA: Diagnosis present

## 2018-10-24 DIAGNOSIS — Z87891 Personal history of nicotine dependence: Secondary | ICD-10-CM | POA: Diagnosis not present

## 2018-10-24 DIAGNOSIS — I5032 Chronic diastolic (congestive) heart failure: Secondary | ICD-10-CM | POA: Diagnosis not present

## 2018-10-24 DIAGNOSIS — A419 Sepsis, unspecified organism: Secondary | ICD-10-CM

## 2018-10-24 DIAGNOSIS — E039 Hypothyroidism, unspecified: Secondary | ICD-10-CM | POA: Diagnosis present

## 2018-10-24 DIAGNOSIS — Z79890 Hormone replacement therapy (postmenopausal): Secondary | ICD-10-CM | POA: Diagnosis not present

## 2018-10-24 DIAGNOSIS — Z8673 Personal history of transient ischemic attack (TIA), and cerebral infarction without residual deficits: Secondary | ICD-10-CM | POA: Diagnosis not present

## 2018-10-24 DIAGNOSIS — J441 Chronic obstructive pulmonary disease with (acute) exacerbation: Secondary | ICD-10-CM | POA: Diagnosis present

## 2018-10-24 DIAGNOSIS — E785 Hyperlipidemia, unspecified: Secondary | ICD-10-CM

## 2018-10-24 DIAGNOSIS — Z9012 Acquired absence of left breast and nipple: Secondary | ICD-10-CM | POA: Diagnosis not present

## 2018-10-24 DIAGNOSIS — Z9221 Personal history of antineoplastic chemotherapy: Secondary | ICD-10-CM | POA: Diagnosis not present

## 2018-10-24 LAB — BLOOD CULTURE ID PANEL (REFLEXED)

## 2018-10-24 LAB — BRAIN NATRIURETIC PEPTIDE: B Natriuretic Peptide: 498.3 pg/mL — ABNORMAL HIGH (ref 0.0–100.0)

## 2018-10-24 LAB — BASIC METABOLIC PANEL
Anion gap: 9 (ref 5–15)
BUN: 38 mg/dL — ABNORMAL HIGH (ref 8–23)
CO2: 24 mmol/L (ref 22–32)
Calcium: 8.7 mg/dL — ABNORMAL LOW (ref 8.9–10.3)
Chloride: 99 mmol/L (ref 98–111)
Creatinine, Ser: 1.41 mg/dL — ABNORMAL HIGH (ref 0.44–1.00)
GFR calc Af Amer: 41 mL/min — ABNORMAL LOW (ref >60)
GFR calc non Af Amer: 35 mL/min — ABNORMAL LOW (ref >60)
Glucose, Bld: 118 mg/dL — ABNORMAL HIGH (ref 70–99)
Potassium: 3.5 mmol/L (ref 3.5–5.1)
Sodium: 132 mmol/L — ABNORMAL LOW (ref 135–145)

## 2018-10-24 LAB — CBC
HCT: 34.7 % — ABNORMAL LOW (ref 36.0–46.0)
Hemoglobin: 10.7 g/dL — ABNORMAL LOW (ref 12.0–15.0)
MCH: 28.5 pg (ref 26.0–34.0)
MCHC: 30.8 g/dL (ref 30.0–36.0)
MCV: 92.5 fL (ref 80.0–100.0)
Platelets: 239 10*3/uL (ref 150–400)
RBC: 3.75 MIL/uL — ABNORMAL LOW (ref 3.87–5.11)
RDW: 14.8 % (ref 11.5–15.5)
WBC: 6.3 10*3/uL (ref 4.0–10.5)
nRBC: 0 % (ref 0.0–0.2)

## 2018-10-24 LAB — SARS CORONAVIRUS 2 BY RT PCR (HOSPITAL ORDER, PERFORMED IN ~~LOC~~ HOSPITAL LAB): SARS Coronavirus 2: NEGATIVE

## 2018-10-24 LAB — PROCALCITONIN: Procalcitonin: 3.48 ng/mL

## 2018-10-24 LAB — LACTIC ACID, PLASMA
Lactic Acid, Venous: 0.9 mmol/L (ref 0.5–1.9)
Lactic Acid, Venous: 1.1 mmol/L (ref 0.5–1.9)
Lactic Acid, Venous: 1.3 mmol/L (ref 0.5–1.9)

## 2018-10-24 MED ORDER — HYDRALAZINE HCL 20 MG/ML IJ SOLN: 5.0000 mg | INTRAMUSCULAR | Status: DC | PRN

## 2018-10-24 MED ORDER — LEVOTHYROXINE SODIUM 75 MCG PO TABS
175.0000 ug | ORAL_TABLET | Freq: Every day | ORAL | Status: DC
  Administered 2018-10-24 – 2018-10-28 (×5): 175 ug via ORAL
  Filled 2018-10-24 (×5): qty 1

## 2018-10-24 MED ORDER — SODIUM CHLORIDE 0.9 % IV BOLUS
500.0000 mL | Freq: Once | INTRAVENOUS | Status: AC
  Administered 2018-10-24: 500 mL via INTRAVENOUS

## 2018-10-24 MED ORDER — PROPRANOLOL HCL ER 60 MG PO CP24
60.0000 mg | ORAL_CAPSULE | ORAL | Status: DC
  Administered 2018-10-24: 120 mg via ORAL
  Filled 2018-10-24: qty 2

## 2018-10-24 MED ORDER — ACETAMINOPHEN 325 MG PO TABS
650.0000 mg | ORAL_TABLET | Freq: Four times a day (QID) | ORAL | Status: DC | PRN
  Administered 2018-10-24 – 2018-10-25 (×2): 650 mg via ORAL
  Filled 2018-10-24 (×2): qty 2

## 2018-10-24 MED ORDER — ATORVASTATIN CALCIUM 10 MG PO TABS
20.0000 mg | ORAL_TABLET | Freq: Every day | ORAL | Status: DC
  Administered 2018-10-24 – 2018-10-27 (×4): 20 mg via ORAL
  Filled 2018-10-24 (×4): qty 2

## 2018-10-24 MED ORDER — LORATADINE 10 MG PO TABS: 10.0000 mg | ORAL_TABLET | Freq: Every day | ORAL | Status: DC | PRN

## 2018-10-24 MED ORDER — POLYVINYL ALCOHOL 1.4 % OP SOLN
1.0000 [drp] | Freq: Every day | OPHTHALMIC | Status: DC | PRN
  Administered 2018-10-27: 23:00:00 1 [drp] via OPHTHALMIC
  Filled 2018-10-24: qty 15

## 2018-10-24 MED ORDER — ADULT MULTIVITAMIN W/MINERALS CH
1.0000 | ORAL_TABLET | Freq: Every day | ORAL | Status: DC
  Administered 2018-10-24 – 2018-10-28 (×5): 1 via ORAL
  Filled 2018-10-24 (×5): qty 1

## 2018-10-24 MED ORDER — FLUTICASONE PROPIONATE 50 MCG/ACT NA SUSP
2.0000 | Freq: Every day | NASAL | Status: DC | PRN
  Filled 2018-10-24: qty 16

## 2018-10-24 MED ORDER — FENOFIBRATE 160 MG PO TABS
160.0000 mg | ORAL_TABLET | Freq: Every day | ORAL | Status: DC
  Administered 2018-10-24 – 2018-10-27 (×4): 160 mg via ORAL
  Filled 2018-10-24 (×4): qty 1

## 2018-10-24 MED ORDER — IPRATROPIUM BROMIDE 0.02 % IN SOLN
0.5000 mg | RESPIRATORY_TRACT | Status: DC
  Administered 2018-10-24: 04:00:00 0.5 mg via RESPIRATORY_TRACT
  Filled 2018-10-24: qty 2.5

## 2018-10-24 MED ORDER — DM-GUAIFENESIN ER 30-600 MG PO TB12
1.0000 | ORAL_TABLET | Freq: Two times a day (BID) | ORAL | Status: DC
  Administered 2018-10-24 – 2018-10-28 (×9): 1 via ORAL
  Filled 2018-10-24 (×10): qty 1

## 2018-10-24 MED ORDER — SODIUM CHLORIDE 0.9 % IV SOLN
INTRAVENOUS | Status: DC
  Administered 2018-10-24 (×2): via INTRAVENOUS

## 2018-10-24 MED ORDER — SODIUM CHLORIDE 0.9 % IV SOLN
2.0000 g | INTRAVENOUS | Status: DC
  Administered 2018-10-24 – 2018-10-27 (×4): 2 g via INTRAVENOUS
  Filled 2018-10-24: qty 2
  Filled 2018-10-24 (×4): qty 20

## 2018-10-24 MED ORDER — TIOTROPIUM BROMIDE MONOHYDRATE 2.5 MCG/ACT IN AERS: 2.0000 | INHALATION_SPRAY | Freq: Every day | RESPIRATORY_TRACT | Status: DC

## 2018-10-24 MED ORDER — ONDANSETRON HCL 4 MG/2ML IJ SOLN: 4.0000 mg | Freq: Three times a day (TID) | INTRAMUSCULAR | Status: DC | PRN

## 2018-10-24 MED ORDER — LEVALBUTEROL HCL 0.63 MG/3ML IN NEBU: 0.6300 mg | INHALATION_SOLUTION | Freq: Four times a day (QID) | RESPIRATORY_TRACT | Status: DC | PRN

## 2018-10-24 MED ORDER — IPRATROPIUM BROMIDE 0.02 % IN SOLN
0.5000 mg | Freq: Three times a day (TID) | RESPIRATORY_TRACT | Status: DC
  Administered 2018-10-24 – 2018-10-25 (×4): 0.5 mg via RESPIRATORY_TRACT
  Filled 2018-10-24 (×4): qty 2.5

## 2018-10-24 MED ORDER — APIXABAN 5 MG PO TABS
5.0000 mg | ORAL_TABLET | Freq: Two times a day (BID) | ORAL | Status: DC
  Administered 2018-10-24 – 2018-10-28 (×9): 5 mg via ORAL
  Filled 2018-10-24 (×10): qty 1

## 2018-10-24 MED ORDER — DM-GUAIFENESIN ER 30-600 MG PO TB12
1.0000 | ORAL_TABLET | Freq: Two times a day (BID) | ORAL | Status: DC | PRN
  Filled 2018-10-24: qty 1

## 2018-10-24 MED ORDER — SODIUM CHLORIDE 0.9 % IV SOLN
1.0000 g | INTRAVENOUS | Status: DC
  Filled 2018-10-24: qty 10

## 2018-10-24 NOTE — ED Notes (Signed)
Patient transported to X-ray 

## 2018-10-24 NOTE — H&P (Addendum)
History and Physical    Kristin Hood:[id number] DOB: 28-Nov-1940 DOA: 10/23/2018  Referring MD/NP/PA:   PCP: [name], MD   Patient coming from:  The patient is coming from home.  At baseline, pt is independent for most of ADL.        Chief Complaint: Fever, chills, bilateral flank pain  HPI: Kristin Hood is a 78 y.o. female with medical history significant of hypertension, hyperlipidemia, COPD, stroke, GERD, hypothyroidism, atrial fibrillation on Eliquis, dCHF, breast cancer (left mastectomy), marginal zone lymphoma under observation, CKD stage III, obesity, who presents with fever, chills, bilateral flank pain.  Patient states that she has been having intermittent fever and chills since yesterday. T-max at home was 102 yesterday afternoon. She complains of bilateral flank pain, onset today,  She has mild cough with clear mucus production and some shortness of breath which she attributed to COPD.  No chest pain. She states that she does not have dysuria or burning on urination, but has increased urinary frequency which she attributes to Lasix use.  Denies nausea vomiting, diarrhea, abdominal pain.  ED Course: pt was found to have positive urinalysis (cloudy appearance, many bacteria, moderate amount of leukocyte, WBC >50), negative COVID-19 test, WBC 7.4, lactic acid 1.6, stable renal function, temperature one 1.7, blood pressure 106/78, tachycardia, tachypnea, oxygen saturation 94% on room air.  Patient is admitted to telemetry bed as inpatient.  CXR showed: 1. Cardiomegaly. 2. Bibasilar airspace opacities similar to prior study, favored to represent atelectasis or scarring. 3. Persistent trace right-sided pleural effusion.  Review of Systems:   General: has fevers, chills, no body weight gain, has fatigue HEENT: no blurry vision, hearing changes or sore throat Respiratory: has dyspnea, coughing, no wheezing CV: no chest pain, no palpitations GI: no nausea, vomiting,  abdominal pain, diarrhea, constipation GU: no dysuria, burning on urination, increased urinary frequency, hematuria  Ext: has mild leg edema Neuro: no unilateral weakness, numbness, or tingling, no vision change or hearing loss Skin: no rash, no skin tear. MSK: No muscle spasm, no deformity, no limitation of range of movement in spin Heme: No easy bruising.  Travel history: No recent long distant travel.  Allergy:  Allergies  Allergen Reactions  . Floxin [Ofloxacin] Nausea Only and Other (See Comments)    Dizziness, Vertigo     Past Medical History:  Diagnosis Date  . Acute congestive heart failure ([place])   . Acute diastolic CHF (congestive heart failure) ([place]) 12/30/2016  . Acute respiratory failure with hypoxemia ([place]) 06/09/2016  . Acute respiratory failure with hypoxia ([place]) 12/30/2016  . Anginal pain ([place])   . ARF (acute renal failure) ([place]) 06/11/2016  . Arthritis   . Atrial fibrillation ([place]) 07/2012   Eliquis, rate controlled  . Benign hypertensive heart disease without heart failure 12/25/2012  . Breast cancer ([place]) 2001   left mastectomy  . Chest pain 12/11/2012  . CHF (congestive heart failure) ([place]) 06/10/2016  . Chronic atrial fibrillation 11/16/2014  . Chronic diastolic heart failure ([place]) 02/13/2014  . Coarse tremors    , Essential  . Complication of anesthesia    SMALLER TUBE FOR INTUBATION AS GAGS ON TUBE  . COPD (chronic obstructive pulmonary disease) ([place])   . COPD exacerbation ([place]) 12/30/2016  . CVA (cerebral vascular accident) ([place]) 2013  . Dysrhythmia 08/20/2012   Atrial Fibrillation  . Essential and other specified forms of tremor 06/18/2012  . Essential hypertension 06/18/2012  . Family history of adverse reaction to anesthesia  mother experienced pain with intubation   . GERD (gastroesophageal reflux disease)    history of   . Hard of hearing   . Heart murmur   . History of chemotherapy 11/13/1998 to 2010   Adriamycin/Docotaxol,  Dexorubicin/Taxotere, Femara, Tamoxifen  . History of CVA (cerebrovascular accident) 12/11/2012  . History of hiatal hernia    history of   . Hx of adenomatous colonic polyps 2006   , Benign  . Hypercholesteremia   . Hyperlipidemia 02/13/2014  . Hypertension    , With mild concentric left ventricular hypertrophy  . Long term current use of anticoagulant therapy 12/25/2012  . Mixed dyslipidemia   . Mixed hyperlipidemia 12/25/2012  . Morbid obesity ([place]) 06/18/2012  . Phlebitis and thrombophlebitis of superficial vessels of lower extremities 12/25/2012  . 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 Pure hypercholesterolemia 12/25/2012  . Shingles   . Shortness of breath dyspnea    on exertion   . SOB (shortness of breath) 11/16/2014  . Spider veins   . Stroke ([place]) 11/17/2011   left side brain-speech-TPA  . Thyroid nodule   . Tremor, essential   . Unspecified cerebral artery occlusion with cerebral infarction 11/18/2011  . Valvular sclerosis 09/23/2003   without stenosis  . Varicose veins     Past Surgical History:  Procedure Laterality Date  . APPENDECTOMY  07/10/1957  . BIOPSY THYROID  12/01/2013   ultrasound  . BREAST SURGERY    . BUNIONECTOMY  11/29/1988   bilateral with hammer toes  . CARDIAC CATHETERIZATION  07/2010   , Without significant CAD  . CARDIAC CATHETERIZATION  07/2011   [name]  . CESAREAN SECTION    . CESAREAN SECTION  05/1968, 07/1965  . EYE SURGERY  01/31/2013   eye lid; cataract surgery bilat   . JOINT REPLACEMENT  05/2001   left knee  . JOINT REPLACEMENT  11/2001   right knee  . JOINT REPLACEMENT  01/2008   redo right knee  . KNEE SURGERY  2009   ,Total knee replacement  . LEFT HEART CATHETERIZATION WITH CORONARY ANGIOGRAM N/A 08/14/2011   Procedure: LEFT HEART CATHETERIZATION WITH CORONARY ANGIOGRAM;  Surgeon: [name], MD;  Location: [facility] CATH LAB;  Service: Cardiovascular;  Laterality: N/A;  . Left Rotator Cuff    . MASTECTOMY  10/19/1998    Radical, left -with lymph nodes (8 total)  . Reverse bunionectomy  08/29/1993   removal of Tibial Sesamoid-left  . TEE WITHOUT CARDIOVERSION  11/19/2011   Procedure: TRANSESOPHAGEAL ECHOCARDIOGRAM (TEE);  Surgeon: [name], MD;  Location: [facility] ENDOSCOPY;  Service: Cardiovascular;  Laterality: N/A;  . THYROIDECTOMY N/A 03/02/2015   Procedure: TOTAL THYROIDECTOMY;  Surgeon: Armandina Gemma, MD;  Location: [facility] ORS;  Service: General;  Laterality: N/A;  . TONSILLECTOMY    . TOTAL HIP ARTHROPLASTY Left 10/11/2014   Procedure: LEFT TOTAL HIP ARTHROPLASTY ANTERIOR APPROACH;  Surgeon: [name], MD;  Location: [facility] ORS;  Service: Orthopedics;  Laterality: Left;    Social History:  reports that she quit smoking about 26 years ago. Her smoking use included cigarettes. She has a 60.00 pack-year smoking history. She has never used smokeless tobacco. She reports current alcohol use of about 1.0 standard drinks of alcohol per week. She reports that she does not use drugs.  Family History:  Family History  Problem Relation Age of Onset  . Heart disease Mother   . Aortic stenosis Mother   . Heart disease Father   . Heart failure Father   .  Heart attack Father      Prior to Admission medications   Medication Sig Start Date End Date Taking? Authorizing Provider  albuterol (PROVENTIL HFA;VENTOLIN HFA) 108 (90 Base) MCG/ACT inhaler Inhale 2 puffs into the lungs every 4 (four) hours as needed for wheezing or shortness of breath. 02/24/18   Collene Gobble, MD  atorvastatin (LIPITOR) 20 MG tablet Take 20 mg by mouth at bedtime.     [provider]  ELIQUIS 5 MG TABS tablet TAKE 1 TABLET BY MOUTH TWICE A DAY 07/13/18   Jerline Pain, MD  fenofibrate 160 MG tablet Take 160 mg by mouth at bedtime.     [provider]  ferrous sulfate 325 (65 FE) MG EC tablet Take 325 mg by mouth at bedtime.     [provider]  fluticasone (FLONASE) 50 MCG/ACT nasal spray Place 2 sprays into both nostrils  daily as needed for allergies or rhinitis.     [provider]  furosemide (LASIX) 40 MG tablet Take 1 tablet (40 mg total) by mouth daily. 06/04/18   Jerline Pain, MD  guaiFENesin (MUCINEX) 600 MG 12 hr tablet Take 600 mg by mouth daily.    [provider]  ketoconazole (NIZORAL) 2 % cream Apply 1 application topically daily as needed for irritation (rash).  01/24/15   [provider]  levothyroxine (SYNTHROID, LEVOTHROID) 175 MCG tablet TAKE 1 TABLET BY MOUTH EVERY DAY BEFORE BREAKFAST 02/15/18   Philemon Kingdom, MD  loratadine (CLARITIN) 10 MG tablet Take 10 mg by mouth daily as needed for allergies (rash).     [provider]  losartan (COZAAR) 100 MG tablet Take 100 mg by mouth daily.    [provider]  Multiple Vitamin (MULTIVITAMIN WITH MINERALS) TABS tablet Take 1 tablet by mouth daily at 2 PM.     [provider]  omeprazole (PRILOSEC) 20 MG capsule Take 20 mg by mouth daily at 2 PM.     [provider]  polyvinyl alcohol (ARTIFICIAL TEARS) 1.4 % ophthalmic solution Place 1 drop into both eyes daily as needed for dry eyes.    [provider]  potassium chloride (K-DUR) 10 MEQ tablet Take 1 tablet (10 mEq total) by mouth daily. 06/04/18   Jerline Pain, MD  propranolol ER (INDERAL LA) 60 MG 24 hr capsule TAKE 2 CAPS IN THE MORNING TAKE 1 CAP IN THE AFTERNOON, 1 CAP AT NIGHT 09/23/18   Jerline Pain, MD  Tiotropium Bromide Monohydrate (SPIRIVA RESPIMAT) 2.5 MCG/ACT AERS Inhale 2 puffs into the lungs daily. 08/10/17   Collene Gobble, MD    Physical Exam: Vitals:   10/23/18 2311 10/23/18 2330 10/24/18 0015 10/24/18 0116  BP: 121/71 106/78 112/65   Pulse: (!) 105 (!) 106 100   Resp: (!) 24 19 (!) 22   Temp:      TempSrc:      SpO2: 94% 95% 96%   Weight:    95.3 kg  Height:    5\' 6"  (1.676 m)   General: Not in acute distress HEENT:       Eyes: PERRL, EOMI, no scleral icterus.       ENT: No discharge from the  ears and nose, no pharynx injection, no tonsillar enlargement.        Neck: No JVD, no bruit, no mass felt. Heme: No neck lymph node enlargement. Cardiac: S1/S2, RRR, No murmurs, No gallops or rubs. Respiratory: No rales, wheezing, rhonchi or rubs.  GI: Soft, nondistended, nontender, no rebound pain, no organomegaly, BS present. GU: No hematuria. Has bilateral CVA tenderness. Ext: has trace leg edema bilaterally. 2+DP/PT pulse bilaterally. Musculoskeletal: No joint deformities, No joint redness or warmth, no limitation of ROM in spin. Skin: No rashes.  Neuro: Alert, oriented X3, cranial nerves II-XII grossly intact, moves all extremities normally.  Psych: Patient is not psychotic, no suicidal or hemocidal ideation.  Labs on Admission: I have personally reviewed following labs and imaging studies  CBC: Recent Labs  Lab 10/23/18 2200  WBC 7.4  NEUTROABS 6.5  HGB 11.5*  HCT 35.8*  MCV 91.1  PLT 169   Basic Metabolic Panel: Recent Labs  Lab 10/23/18 2200  NA 133*  K 4.3  CL 98  CO2 24  GLUCOSE 142*  BUN 41*  CREATININE 1.57*  CALCIUM 9.2   GFR: Estimated Creatinine Clearance: 33.8 mL/min (A) (by C-G formula based on SCr of 1.57 mg/dL (H)). Liver Function Tests: Recent Labs  Lab 10/23/18 2200  AST 40  ALT 17  ALKPHOS 37*  BILITOT 1.6*  PROT 8.6*  ALBUMIN 3.3*   No results for input(s): LIPASE, AMYLASE in the last 168 hours. No results for input(s): AMMONIA in the last 168 hours. Coagulation Profile: Recent Labs  Lab 10/23/18 2200  INR 1.6*   Cardiac Enzymes: No results for input(s): CKTOTAL, CKMB, CKMBINDEX, TROPONINI in the last 168 hours. BNP (last 3 results) No results for input(s): PROBNP in the last 8760 hours. HbA1C: No results for input(s): HGBA1C in the last 72 hours. CBG: No results for input(s): GLUCAP in the last 168 hours. Lipid Profile: No results for input(s): CHOL, HDL, LDLCALC, TRIG, CHOLHDL, LDLDIRECT in the last 72 hours. Thyroid  Function Tests: No results for input(s): TSH, T4TOTAL, FREET4, T3FREE, THYROIDAB in the last 72 hours. Anemia Panel: No results for input(s): VITAMINB12, FOLATE, FERRITIN, TIBC, IRON, RETICCTPCT in the last 72 hours. Urine analysis:    Component Value Date/Time   COLORURINE AMBER (A) 10/23/2018 2218   APPEARANCEUR CLOUDY (A) 10/23/2018 2218   LABSPEC 1.016 10/23/2018 2218   PHURINE 5.0 10/23/2018 2218   GLUCOSEU NEGATIVE 10/23/2018 2218   HGBUR LARGE (A) 10/23/2018 2218   BILIRUBINUR NEGATIVE 10/23/2018 [place] 10/23/2018 2218   PROTEINUR 100 (A) 10/23/2018 2218   UROBILINOGEN 1.0 10/04/2014 1457   NITRITE NEGATIVE 10/23/2018 2218   LEUKOCYTESUR MODERATE (A) 10/23/2018 2218   Sepsis Labs: @LABRCNTIP (procalcitonin:4,lacticidven:4) ) Recent Results (from the past 240 hour(s))  SARS Coronavirus 2 (CEPHEID - Performed in [place] hospital lab), Hosp Order     Status: None   Collection Time: 10/23/18 11:45 PM   Specimen: Nasopharyngeal Swab  Result Value Ref Range Status   SARS Coronavirus 2 NEGATIVE NEGATIVE Final    Comment: (NOTE) If result is NEGATIVE SARS-CoV-2 target nucleic acids are NOT DETECTED. The SARS-CoV-2 RNA is generally detectable in upper and lower  respiratory specimens during the acute phase of infection. The lowest  concentration of SARS-CoV-2 viral copies this assay can detect is 250  copies / mL. A negative result does not preclude SARS-CoV-2 infection  and should not be used as the sole basis for treatment or other  patient management decisions.  A negative result may occur with  improper specimen collection / handling, submission of specimen other  than nasopharyngeal swab, presence of viral mutation(s) within the  areas targeted by this assay, and inadequate number of viral copies  (<250 copies / mL). A negative result must  be combined with clinical  observations, patient history, and epidemiological information. If result is  POSITIVE SARS-CoV-2 target nucleic acids are DETECTED. The SARS-CoV-2 RNA is generally detectable in upper and lower  respiratory specimens dur ing the acute phase of infection.  Positive  results are indicative of active infection with SARS-CoV-2.  Clinical  correlation with patient history and other diagnostic information is  necessary to determine patient infection status.  Positive results do  not rule out bacterial infection or co-infection with other viruses. If result is PRESUMPTIVE POSTIVE SARS-CoV-2 nucleic acids MAY BE PRESENT.   A presumptive positive result was obtained on the submitted specimen  and confirmed on repeat testing.  While 2019 novel coronavirus  (SARS-CoV-2) nucleic acids may be present in the submitted sample  additional confirmatory testing may be necessary for epidemiological  and / or clinical management purposes  to differentiate between  SARS-CoV-2 and other Sarbecovirus currently known to infect humans.  If clinically indicated additional testing with an alternate test  methodology [phone]) is advised. The SARS-CoV-2 RNA is generally  detectable in upper and lower respiratory sp ecimens during the acute  phase of infection. The expected result is Negative. Fact Sheet for Patients:  StrictlyIdeas.no Fact Sheet for Healthcare Providers: BankingDealers.co.za This test is not yet approved or cleared by the [place] FDA and has been authorized for detection and/or diagnosis of SARS-CoV-2 by FDA under an Emergency Use Authorization (EUA).  This EUA will remain in effect (meaning this test can be used) for the duration of the COVID-19 declaration under Section 564(b)(1) of the Act, 21 U.S.C. section 360bbb-3(b)(1), unless the authorization is terminated or revoked sooner. Performed at [facility], [place] [address] [place], [place] [address]      Radiological Exams on Admission: Dg Chest 2 View   Result Date: 10/24/2018 CLINICAL DATA:  Shortness of breath fever EXAM: CHEST - 2 VIEW COMPARISON:  May 09, 2018 FINDINGS: Heart size is enlarged. Mitral valve calcifications are again noted. There are patchy bibasilar airspace opacities favored to represent atelectasis or scarring. There is likely a trace right-sided pleural effusion. There is no acute osseous abnormality. IMPRESSION: 1. Cardiomegaly. 2. Bibasilar airspace opacities similar to prior study, favored to represent atelectasis or scarring. 3. Persistent trace right-sided pleural effusion. Electronically Signed   By: Constance Holster M.D.   On: 10/24/2018 00:56     EKG: Independently reviewed.  Atrial fibrillation, QTc 426, poor R wave progression, LAD, low voltage.   Assessment/Plan Principal Problem:   Acute pyelonephritis Active Problems:   Essential hypertension   Atrial fibrillation (HCC)   History of CVA (cerebrovascular accident)   Long term current use of anticoagulant therapy   Hyperlipidemia   Chronic diastolic heart failure (HCC)   COPD (chronic obstructive pulmonary disease) (HCC)   Marginal zone lymphoma (HCC)   GERD (gastroesophageal reflux disease)   Hypothyroidism   Sepsis (HCC)   CKD (chronic kidney disease), stage III ([place])   Sepsis due to acute pyelonephritis: Patient meets critical for sepsis with fever, tachycardia and tachypnea.  Blood pressure 160/78.  Currently hemodynamically stable.  Lactic acid is normal.  -  Admit to telemetry bed as inpt -  Ceftriaxone by IV - Follow up results of urine and blood cx and amend antibiotic regimen if needed per sensitivity results - prn Zofran for nausea - will get Procalcitonin and trend lactic acid levels per sepsis protocol. - IVF: 500 x 2 cc of NS bolus in ED, then 100 cc/h (  patient has a congestive heart failure, limiting aggressive IV fluids treatment).  Essential hypertension: -Hold blood pressure medications since patient is at risk of developing  hypotension due to sepsis - IV hydralazine as needed  Atrial fibrillation ([place]): CHA2DS2-VASc Score is 5, needs oral anticoagulation. Patient is Eliquis at home. Heart rate is 100s -continue Eliquis and propranolol  History of CVA (cerebrovascular accident): -on Eliquis, Lipitor and fenofibrate  Hyperlipidemia: -Lipitor and fenofibrate  Chronic diastolic heart failure ([place]): 2D echo on 06/10/2016 showed EF of 55-60%. Has trace leg edema, but no JVD.  No pulmonary edema chest x-ray.  CHF symptoms compensated. 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 [name]. -Follow-up with hematologist  GERD (gastroesophageal reflux disease); -Protonix  Hypothyroidism: -Continue Synthroid  CKD-III: stable.  Baseline creatinine 1.3-1.5.  Her creatinine is 1.57, BUN 41. -f/u by [facility]    Inpatient status:  # Patient requires inpatient status due to high intensity of service, high risk for further deterioration and high frequency of surveillance required.  I certify that at the point of admission it is my clinical judgment that the patient will require inpatient hospital care spanning beyond 2 midnights from the point of admission.  . This patient has multiple chronic comorbidities including hypertension, hyperlipidemia, COPD, stroke, GERD, hypothyroidism, atrial fibrillation on Eliquis, dCHF, breast cancer (left mastectomy), marginal zone lymphoma under observation, CKD stage III, obesity. . Now patient has presenting with sepsis due to acute pyelonephritis . The worrisome physical exam findings include bilateral CVA tenderness . The initial radiographic and laboratory data are worrisome because of positive urinalysis for UTI . Current medical needs: please see my assessment and  plan . Predictability of an adverse outcome (risk): Patient has multiple comorbidities, now presents with sepsis due to acute pyelonephritis.  Given his old age, and multiple comorbidities, patient is high risk for deteriorating.  Will need to be treated in hospital for at least 2 days.    DVT ppx: on eliquis Code Status: Partial code (I discussed with the patient, and explained the meaning of CODE STATUS, patient wants to be partial code, OK for CPR, but no intubation). Family Communication: None at bed side.     Disposition Plan:  Anticipate discharge back to previous home environment Consults called:  none Admission status: Inpatient/tele       Date of Service 10/24/2018    [facility]   If 7PM-7AM, please contact night-coverage [email or web address] Password [email or web address] 10/24/2018, 2:04 AM

## 2018-10-24 NOTE — Progress Notes (Signed)
[facility] PLAN OF CARE NOTE Patient: Kristin Hood NTI:[id number]   PCP: [name], MD DOB: Jan 02, 1941   DOA: 10/23/2018   DOS: 10/24/2018    Patient was admitted by my colleague Dr. Blaine Hamper earlier on 10/24/2018. I have reviewed the H&P as well as assessment and plan and agree with the same. Important changes in the plan are listed below.  Plan of care: Principal Problem:   Acute pyelonephritis Active Problems:   Essential hypertension   Atrial fibrillation (HCC)   History of CVA (cerebrovascular accident)   Long term current use of anticoagulant therapy   Hyperlipidemia   Chronic diastolic heart failure (HCC)   COPD (chronic obstructive pulmonary disease) (HCC)   Marginal zone lymphoma (HCC)   GERD (gastroesophageal reflux disease)   Hypothyroidism   Sepsis ([place])   CKD (chronic kidney disease), stage III (HCC) Acute left pyelonephritis secondary to E. coli bacteremia. Currently on IV ceftriaxone will continue the same. CT abdomen pelvis was performed due to patient's complaint of hematuria as well as prior history of non-Hodgkin's lymphoma negative for any obstruction. Continue IV hydration. Patient does have history of CHF therefore will need to monitor closely for volume overload. Continue to monitor on telemetry.  Author: Berle Mull, MD [facility] 10/24/2018 5:03 PM   If 7PM-7AM, please contact night-coverage at [email or web address]

## 2018-10-24 NOTE — Progress Notes (Signed)
PHARMACY - PHYSICIAN COMMUNICATION CRITICAL VALUE ALERT - BLOOD CULTURE IDENTIFICATION (BCID)  Kristin Hood is an 78 y.o. female who presented to [facility] on 10/23/2018 with a chief complaint of fever, chills, B/L flank pain  Assessment: 54 YOF with concern for UTI/pyelo now with BCx growing GNR with BCID detecting E.coli.   Name of physician (or Provider) Contacted: Posey Pronto  Current antibiotics: Rocephin 2g IV every 24 hours  Changes to prescribed antibiotics recommended:  Patient is on recommended antibiotics - No changes needed  Results for orders placed or performed during the hospital encounter of 10/23/18  Blood Culture ID Panel (Reflexed) (Collected: 10/24/2018 12:05 AM)  Result Value Ref Range   Enterococcus species NOT DETECTED NOT DETECTED   Listeria monocytogenes NOT DETECTED NOT DETECTED   Staphylococcus species NOT DETECTED NOT DETECTED   Staphylococcus aureus (BCID) NOT DETECTED NOT DETECTED   Streptococcus species NOT DETECTED NOT DETECTED   Streptococcus agalactiae NOT DETECTED NOT DETECTED   Streptococcus pneumoniae NOT DETECTED NOT DETECTED   Streptococcus pyogenes NOT DETECTED NOT DETECTED   Acinetobacter baumannii NOT DETECTED NOT DETECTED   Enterobacteriaceae species DETECTED (A) NOT DETECTED   Enterobacter cloacae complex NOT DETECTED NOT DETECTED   Escherichia coli DETECTED (A) NOT DETECTED   Klebsiella oxytoca NOT DETECTED NOT DETECTED   Klebsiella pneumoniae NOT DETECTED NOT DETECTED   Proteus species NOT DETECTED NOT DETECTED   Serratia marcescens NOT DETECTED NOT DETECTED   Carbapenem resistance NOT DETECTED NOT DETECTED   Haemophilus influenzae NOT DETECTED NOT DETECTED   Neisseria meningitidis NOT DETECTED NOT DETECTED   Pseudomonas aeruginosa NOT DETECTED NOT DETECTED   Candida albicans NOT DETECTED NOT DETECTED   Candida glabrata NOT DETECTED NOT DETECTED   Candida krusei NOT DETECTED NOT DETECTED   Candida parapsilosis NOT DETECTED NOT  DETECTED   Candida tropicalis NOT DETECTED NOT DETECTED    Thank you for allowing pharmacy to be a part of this patient's care.  Alycia Rossetti, PharmD, BCPS Clinical Pharmacist Clinical phone for 10/24/2018: [phone] 10/24/2018 3:39 PM   **Pharmacist phone directory can now be found on [email or web address] (PW [email or web address]).  Listed under [place].

## 2018-10-24 NOTE — Progress Notes (Signed)
Pt arrived from ED. VSS, CHG bath completed, tele monitor applied and verified x2. No pain or SOB. Pt declined for me to call daughter or son for status update. Oriented to room and call light, will continue to monitor.  Jaymes Graff, RN

## 2018-10-25 ENCOUNTER — Encounter: Payer: Self-pay | Admitting: Internal Medicine

## 2018-10-25 DIAGNOSIS — Z8673 Personal history of transient ischemic attack (TIA), and cerebral infarction without residual deficits: Secondary | ICD-10-CM

## 2018-10-25 LAB — CBC WITH DIFFERENTIAL/PLATELET
Abs Immature Granulocytes: 0.01 10*3/uL (ref 0.00–0.07)
Basophils Absolute: 0 10*3/uL (ref 0.0–0.1)
Basophils Relative: 0 %
Eosinophils Absolute: 0 10*3/uL (ref 0.0–0.5)
Eosinophils Relative: 0 %
HCT: 30.7 % — ABNORMAL LOW (ref 36.0–46.0)
Hemoglobin: 9.8 g/dL — ABNORMAL LOW (ref 12.0–15.0)
Immature Granulocytes: 0 %
Lymphocytes Relative: 7 %
Lymphs Abs: 0.4 10*3/uL — ABNORMAL LOW (ref 0.7–4.0)
MCH: 29.1 pg (ref 26.0–34.0)
MCHC: 31.9 g/dL (ref 30.0–36.0)
MCV: 91.1 fL (ref 80.0–100.0)
Monocytes Absolute: 0.7 10*3/uL (ref 0.1–1.0)
Monocytes Relative: 13 %
Neutro Abs: 4.3 10*3/uL (ref 1.7–7.7)
Neutrophils Relative %: 80 %
Platelets: 256 10*3/uL (ref 150–400)
RBC: 3.37 MIL/uL — ABNORMAL LOW (ref 3.87–5.11)
RDW: 14.7 % (ref 11.5–15.5)
WBC: 5.4 10*3/uL (ref 4.0–10.5)
nRBC: 0 % (ref 0.0–0.2)

## 2018-10-25 LAB — COMPREHENSIVE METABOLIC PANEL
ALT: 21 U/L (ref 0–44)
AST: 41 U/L (ref 15–41)
Albumin: 2.7 g/dL — ABNORMAL LOW (ref 3.5–5.0)
Alkaline Phosphatase: 32 U/L — ABNORMAL LOW (ref 38–126)
Anion gap: 12 (ref 5–15)
BUN: 29 mg/dL — ABNORMAL HIGH (ref 8–23)
CO2: 21 mmol/L — ABNORMAL LOW (ref 22–32)
Calcium: 8.6 mg/dL — ABNORMAL LOW (ref 8.9–10.3)
Chloride: 101 mmol/L (ref 98–111)
Creatinine, Ser: 1.16 mg/dL — ABNORMAL HIGH (ref 0.44–1.00)
GFR calc Af Amer: 52 mL/min — ABNORMAL LOW (ref >60)
GFR calc non Af Amer: 45 mL/min — ABNORMAL LOW (ref >60)
Glucose, Bld: 98 mg/dL (ref 70–99)
Potassium: 3.7 mmol/L (ref 3.5–5.1)
Sodium: 134 mmol/L — ABNORMAL LOW (ref 135–145)
Total Bilirubin: 1.5 mg/dL — ABNORMAL HIGH (ref 0.3–1.2)
Total Protein: 7.3 g/dL (ref 6.5–8.1)

## 2018-10-25 MED ORDER — PROPRANOLOL HCL ER 120 MG PO CP24
120.0000 mg | ORAL_CAPSULE | Freq: Every day | ORAL | Status: DC
  Filled 2018-10-25: qty 1

## 2018-10-25 MED ORDER — PROPRANOLOL HCL ER 60 MG PO CP24
60.0000 mg | ORAL_CAPSULE | Freq: Every day | ORAL | Status: DC
  Administered 2018-10-25 – 2018-10-27 (×3): 60 mg via ORAL
  Filled 2018-10-25 (×4): qty 1

## 2018-10-25 MED ORDER — PROPRANOLOL HCL ER 60 MG PO CP24
120.0000 mg | ORAL_CAPSULE | Freq: Every day | ORAL | Status: DC
  Administered 2018-10-25 – 2018-10-28 (×4): 120 mg via ORAL
  Filled 2018-10-25 (×4): qty 2

## 2018-10-25 NOTE — Progress Notes (Signed)
[facility] PROGRESS NOTE  Kristin Hood ZOX:[id number] DOB: 1940-09-01 DOA: 10/23/2018 PCP: [name], MD  Brief summary   78 y.o. female with medical history significant of hypertension, hyperlipidemia, COPD, stroke, GERD, hypothyroidism, atrial fibrillation on Eliquis, dCHF, breast cancer (left mastectomy), marginal zone lymphoma under observation, CKD stage III, obesity, who presents with fever, chills, bilateral flank pain.  ED Course: pt was found to have positive urinalysis (cloudy appearance, many bacteria, moderate amount of leukocyte, WBC >50), negative COVID-19 test, WBC 7.4, lactic acid 1.6, stable renal function, temperature one 1.7, blood pressure 106/78, tachycardia, tachypnea, oxygen saturation 94% on room air.  Patient is admitted to telemetry bed as inpatient.  Assessment/Plan:  Sepsis due to acute pyelonephritis: bacteremia. Patient meets critical for sepsis with fever, tachycardia and tachypnea.  Blood pressure 160/78.  remains hemodynamically stable. Lactic acid is normal. Blood culture (7/26): perlim e coli. Urine culture+e coli.   - cont Ceftriaxone by IV, pend culture/sensitivity. Monitor   Essential hypertension: on propranolol. monitor. Prn hydralazine   Atrial fibrillation ([place]): CHA2DS2-VASc Score is 5, needs oral anticoagulation. Patient is Eliquis at home. Heart rate is 100 s. continue Eliquis and propranolol  History of CVA (cerebrovascular accident): no new neuro symptoms. on Eliquis, Lipitor and fenofibrate  Hyperlipidemia: cont Lipitor and fenofibrate  Chronic diastolic heart failure ([place]): 2D echo on 06/10/2016 showed EF of 55-60%. Has trace leg edema, but no JVD.  No pulmonary edema chest x-ray.  CHF symptoms compensated. Resume lasix in 24 hrs   COPD (chronic obstructive pulmonary disease) ([place]): no wheezing on exam. 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 [name]. Follow-up with hematologist  GERD (gastroesophageal reflux disease); cont Protonix  Hypothyroidism: Continue Synthroid  CKD-III: stable.  Baseline creatinine 1.3-1.5.  Her creatinine is 1.57, BUN 41. Monitor   Code Status: partial  Family Communication: d/w patient, Therapist, sports. Updated her family (indicate person spoken with, relationship, and if by phone, the number) Disposition Plan: remains inpatient for 24-48 hrs. obtain pt.   Consultants:  none  Procedures:  none  Antibiotics: Anti-infectives (From admission, onward)   Start     Dose/Rate Route Frequency Ordered Stop   10/24/18 2200  cefTRIAXone (ROCEPHIN) 1 g in sodium chloride 0.9 % 100 mL IVPB  Status:  Discontinued     1 g 200 mL/hr over 30 Minutes Intravenous Every 24 hours 10/24/18 0258 10/24/18 1424   10/24/18 2200  cefTRIAXone (ROCEPHIN) 2 g in sodium chloride 0.9 % 100 mL IVPB     2 g 200 mL/hr over 30 Minutes Intravenous Every 24 hours 10/24/18 1424     10/23/18 2315  cefTRIAXone (ROCEPHIN) 1 g in sodium chloride 0.9 % 100 mL IVPB     1 g 200 mL/hr over 30 Minutes Intravenous  Once 10/23/18 2305 10/24/18 0100        (indicate start date, and stop date if known)  HPI/Subjective: No distress. Reports feeling well.   Objective: Vitals:   10/25/18 0631 10/25/18 0846  BP: (!) 107/59   Pulse:    Resp: 16   Temp: 98.4 F (36.9 C)   SpO2: 94% 94%    Intake/Output Summary (Last 24 hours) at 10/25/2018 0930 Last data filed at 10/25/2018 0900 Gross per 24 hour  Intake 2138.57 ml  Output -  Net 2138.57 ml   Filed Weights   10/24/18 0116 10/24/18 0259 10/25/18 0328  Weight: 95.3 kg 94.2 kg 96.1  kg    Exam:   General:  No distress   Cardiovascular: s1,s2 rrr  Respiratory: diminished at bases   Abdomen: soft, nt   Musculoskeletal: no leg edema    Data Reviewed: Basic Metabolic Panel: Recent Labs  Lab 10/23/18 2200 10/24/18 0354 10/25/18 0335  NA 133* 132* 134*   K 4.3 3.5 3.7  CL 98 99 101  CO2 24 24 21*  GLUCOSE 142* 118* 98  BUN 41* 38* 29*  CREATININE 1.57* 1.41* 1.16*  CALCIUM 9.2 8.7* 8.6*   Liver Function Tests: Recent Labs  Lab 10/23/18 2200 10/25/18 0335  AST 40 41  ALT 17 21  ALKPHOS 37* 32*  BILITOT 1.6* 1.5*  PROT 8.6* 7.3  ALBUMIN 3.3* 2.7*   No results for input(s): LIPASE, AMYLASE in the last 168 hours. No results for input(s): AMMONIA in the last 168 hours. CBC: Recent Labs  Lab 10/23/18 2200 10/24/18 0354 10/25/18 0335  WBC 7.4 6.3 5.4  NEUTROABS 6.5  --  4.3  HGB 11.5* 10.7* 9.8*  HCT 35.8* 34.7* 30.7*  MCV 91.1 92.5 91.1  PLT 279 239 256   Cardiac Enzymes: No results for input(s): CKTOTAL, CKMB, CKMBINDEX, TROPONINI in the last 168 hours. BNP (last 3 results) Recent Labs    05/09/18 1543 10/24/18 0354  BNP 548.5* 498.3*    ProBNP (last 3 results) No results for input(s): PROBNP in the last 8760 hours.  CBG: No results for input(s): GLUCAP in the last 168 hours.  Recent Results (from the past 240 hour(s))  Urine culture     Status: Abnormal (Preliminary result)   Collection Time: 10/23/18  9:38 PM   Specimen: Urine, Random  Result Value Ref Range Status   Specimen Description URINE, RANDOM  Final   Special Requests NONE  Final   Culture (A)  Final    >=100,000 COLONIES/mL ESCHERICHIA COLI SUSCEPTIBILITIES TO FOLLOW Performed at [facility], [address] [place], [place] [address]    Report Status PENDING  Incomplete  Culture, blood (Routine x 2)     Status: None (Preliminary result)   Collection Time: 10/23/18 11:40 PM   Specimen: BLOOD RIGHT HAND  Result Value Ref Range Status   Specimen Description BLOOD RIGHT HAND  Final   Special Requests   Final    BOTTLES DRAWN AEROBIC AND ANAEROBIC Blood Culture adequate volume   Culture  Setup Time   Final    GRAM NEGATIVE RODS ANAEROBIC BOTTLE ONLY Performed at [facility], [place] [address], [place], [place] [address]     Culture GRAM NEGATIVE RODS  Final   Report Status PENDING  Incomplete  SARS Coronavirus 2 (CEPHEID - Performed in [place] [facility] lab), Hosp Order     Status: None   Collection Time: 10/23/18 11:45 PM   Specimen: Nasopharyngeal Swab  Result Value Ref Range Status   SARS Coronavirus 2 NEGATIVE NEGATIVE Final    Comment: (NOTE) If result is NEGATIVE SARS-CoV-2 target nucleic acids are NOT DETECTED. The SARS-CoV-2 RNA is generally detectable in upper and lower  respiratory specimens during the acute phase of infection. The lowest  concentration of SARS-CoV-2 viral copies this assay can detect is 250  copies / mL. A negative result does not preclude SARS-CoV-2 infection  and should not be used as the sole basis for treatment or other  patient management decisions.  A negative result may occur with  improper specimen collection / handling, submission of specimen other  than  nasopharyngeal swab, presence of viral mutation(s) within the  areas targeted by this assay, and inadequate number of viral copies  (<250 copies / mL). A negative result must be combined with clinical  observations, patient history, and epidemiological information. If result is POSITIVE SARS-CoV-2 target nucleic acids are DETECTED. The SARS-CoV-2 RNA is generally detectable in upper and lower  respiratory specimens dur ing the acute phase of infection.  Positive  results are indicative of active infection with SARS-CoV-2.  Clinical  correlation with patient history and other diagnostic information is  necessary to determine patient infection status.  Positive results do  not rule out bacterial infection or co-infection with other viruses. If result is PRESUMPTIVE POSTIVE SARS-CoV-2 nucleic acids MAY BE PRESENT.   A presumptive positive result was obtained on the submitted specimen  and confirmed on repeat testing.  While 2019 novel coronavirus  (SARS-CoV-2) nucleic acids may be present in the submitted sample   additional confirmatory testing may be necessary for epidemiological  and / or clinical management purposes  to differentiate between  SARS-CoV-2 and other Sarbecovirus currently known to infect humans.  If clinically indicated additional testing with an alternate test  methodology [phone]) is advised. The SARS-CoV-2 RNA is generally  detectable in upper and lower respiratory sp ecimens during the acute  phase of infection. The expected result is Negative. Fact Sheet for Patients:  StrictlyIdeas.no Fact Sheet for Healthcare Providers: BankingDealers.co.za This test is not yet approved or cleared by the [place] FDA and has been authorized for detection and/or diagnosis of SARS-CoV-2 by FDA under an Emergency Use Authorization (EUA).  This EUA will remain in effect (meaning this test can be used) for the duration of the COVID-19 declaration under Section 564(b)(1) of the Act, 21 U.S.C. section 360bbb-3(b)(1), unless the authorization is terminated or revoked sooner. Performed at [facility], [place] [address] [place], [place] [address]   Culture, blood (Routine x 2)     Status: Abnormal (Preliminary result)   Collection Time: 10/24/18 12:05 AM   Specimen: BLOOD LEFT HAND  Result Value Ref Range Status   Specimen Description BLOOD LEFT HAND  Final   Special Requests   Final    BOTTLES DRAWN AEROBIC AND ANAEROBIC Blood Culture adequate volume   Culture  Setup Time   Final    GRAM NEGATIVE RODS IN BOTH AEROBIC AND ANAEROBIC BOTTLES CRITICAL RESULT CALLED TO, READ BACK BY AND VERIFIED WITH: PHARMD MARTIN, E. [id number] FCP    Culture (A)  Final    ESCHERICHIA COLI SUSCEPTIBILITIES TO FOLLOW Performed at [facility], [place] [address], [place], [place] [address]    Report Status PENDING  Incomplete  Blood Culture ID Panel (Reflexed)     Status: Abnormal   Collection Time: 10/24/18 12:05 AM  Result Value Ref Range  Status   Enterococcus species NOT DETECTED NOT DETECTED Final   Listeria monocytogenes NOT DETECTED NOT DETECTED Final   Staphylococcus species NOT DETECTED NOT DETECTED Final   Staphylococcus aureus (BCID) NOT DETECTED NOT DETECTED Final   Streptococcus species NOT DETECTED NOT DETECTED Final   Streptococcus agalactiae NOT DETECTED NOT DETECTED Final   Streptococcus pneumoniae NOT DETECTED NOT DETECTED Final   Streptococcus pyogenes NOT DETECTED NOT DETECTED Final   Acinetobacter baumannii NOT DETECTED NOT DETECTED Final   Enterobacteriaceae species DETECTED (A) NOT DETECTED Final    Comment: Enterobacteriaceae represent a large family of gram-negative bacteria, not a single organism. CRITICAL RESULT CALLED TO, READ BACK  BY AND VERIFIED WITH: [place], E. [phone] FCP    Enterobacter cloacae complex NOT DETECTED NOT DETECTED Final   Escherichia coli DETECTED (A) NOT DETECTED Final    Comment: CRITICAL RESULT CALLED TO, READ BACK BY AND VERIFIED WITH: PHARMD MARTIN, E. [phone] FCP    Klebsiella oxytoca NOT DETECTED NOT DETECTED Final   Klebsiella pneumoniae NOT DETECTED NOT DETECTED Final   Proteus species NOT DETECTED NOT DETECTED Final   Serratia marcescens NOT DETECTED NOT DETECTED Final   Carbapenem resistance NOT DETECTED NOT DETECTED Final   Haemophilus influenzae NOT DETECTED NOT DETECTED Final   Neisseria meningitidis NOT DETECTED NOT DETECTED Final   Pseudomonas aeruginosa NOT DETECTED NOT DETECTED Final   Candida albicans NOT DETECTED NOT DETECTED Final   Candida glabrata NOT DETECTED NOT DETECTED Final   Candida krusei NOT DETECTED NOT DETECTED Final   Candida parapsilosis NOT DETECTED NOT DETECTED Final   Candida tropicalis NOT DETECTED NOT DETECTED Final    Comment: Performed at [facility], [place] [address], [place], [place] [address]     Studies: Ct Abdomen Pelvis Wo Contrast  Result Date: 10/24/2018 CLINICAL DATA:  Bilateral flank pain and  hematuria. Recurrent urinary tract infections. Low-grade follicular lymphoma. EXAM: CT ABDOMEN AND PELVIS WITHOUT CONTRAST TECHNIQUE: Multidetector CT imaging of the abdomen and pelvis was performed following the standard protocol without IV contrast. COMPARISON:  08/19/2018 FINDINGS: Lower chest: Stable small right pleural effusion. Stable lymphadenopathy in inferior mediastinum in the right paracentral region measuring 2.8 cm on image 7/3. Hepatobiliary: No mass visualized on this unenhanced exam. Calcified gallstones are again seen, however there is no evidence of cholecystitis or biliary ductal dilatation. Pancreas: No mass or inflammatory process visualized on this unenhanced exam. Spleen: Stable splenomegaly measuring approximately 17 cm in length. Adrenals/Urinary tract: No evidence of urolithiasis or hydronephrosis. New asymmetric left perinephric soft tissue stranding is seen, suspicious for pyelonephritis. Bladder not well visualized due to beam hardening artifact from left hip prosthesis. Stomach/Bowel: No evidence of obstruction, inflammatory process, or abnormal fluid collections. Diverticulosis is seen mainly involving the descending and sigmoid colon, however there is no evidence of diverticulitis. Vascular/Lymphatic: Stable sub-cm lymph nodes in gastrohepatic ligament. Abdominal retroperitoneal lymphadenopathy throughout the left paraaortic and aortocaval spaces also shows no significant change. Index area in the left paraaortic region measures 2.9 cm in short axis on image 33/3, compared to 3.0 cm previously. No new or increased sites of lymphadenopathy are identified. Aortic atherosclerosis. No evidence of abdominal aortic aneurysm. Reproductive: Inferior pelvis is obscured by significant beam hardening artifact from left hip prosthesis. Other:  None. Musculoskeletal:  No suspicious bone lesions identified. IMPRESSION: 1. New asymmetric left perinephric soft tissue stranding, suspicious for  pyelonephritis. No evidence of urolithiasis or hydronephrosis. 2. Stable abdominal and inferior mediastinal lymphadenopathy. No evidence of new or progressive disease. 3. Stable splenomegaly. 4. Cholelithiasis. No radiographic evidence of cholecystitis. 5. Colonic diverticulosis. No radiographic evidence of diverticulitis. 6. Stable small right pleural effusion. Aortic Atherosclerosis (ICD10-I70.0). Electronically Signed   By: Marlaine Hind M.D.   On: 10/24/2018 13:00   Dg Chest 2 View  Result Date: 10/24/2018 CLINICAL DATA:  Shortness of breath fever EXAM: CHEST - 2 VIEW COMPARISON:  May 09, 2018 FINDINGS: Heart size is enlarged. Mitral valve calcifications are again noted. There are patchy bibasilar airspace opacities favored to represent atelectasis or scarring. There is likely a trace right-sided pleural effusion. There is no acute osseous abnormality. IMPRESSION: 1. Cardiomegaly. 2. Bibasilar  airspace opacities similar to prior study, favored to represent atelectasis or scarring. 3. Persistent trace right-sided pleural effusion. Electronically Signed   By: Constance Holster M.D.   On: 10/24/2018 00:56    Scheduled Meds: . apixaban  5 mg Oral BID  . atorvastatin  20 mg Oral QHS  . dextromethorphan-guaiFENesin  1 tablet Oral BID  . fenofibrate  160 mg Oral QHS  . ipratropium  0.5 mg Inhalation TID  . levothyroxine  175 mcg Oral QAC breakfast  . multivitamin with minerals  1 tablet Oral Daily  . propranolol ER  60-120 mg Oral See admin instructions   Continuous Infusions: . sodium chloride 75 mL/hr at 10/24/18 1246  . cefTRIAXone (ROCEPHIN)  IV Stopped (10/24/18 2141)    Principal Problem:   Acute pyelonephritis Active Problems:   Essential hypertension   Atrial fibrillation (HCC)   History of CVA (cerebrovascular accident)   Long term current use of anticoagulant therapy   Hyperlipidemia   Chronic diastolic heart failure (HCC)   COPD (chronic obstructive pulmonary disease)  (HCC)   Marginal zone lymphoma (HCC)   GERD (gastroesophageal reflux disease)   Hypothyroidism   Sepsis ([place])   CKD (chronic kidney disease), stage III ([place])    Time spent: >35 minutes     Kinnie Feil  [facility] Pager [phone]. If 7PM-7AM, please contact night-coverage at [email or web address], password [facility] 10/25/2018, 9:30 AM  LOS: 1 day

## 2018-10-25 NOTE — Evaluation (Signed)
Physical Therapy Evaluation Patient Details Name: Kristin Hood MRN: [id number] DOB: 10/12/1940 Today's Date: 10/25/2018   History of Present Illness  78 yo female with onset of UTI and atelectasis with pleural effusion was admitted to hosp, referred to PT for mobility ck.  Pt has PMHx:  acute respiratory failure, COPD, stroke with TPA, obesity, HOH, essential tremor, hypoxemia, angina, chemotherapy, CKD 3, breast CA, CHF, a-fib,     Clinical Impression  Pt was seen for mobility and strength with a focus on her ability to walk alone.  Her plan is to return home with no equipment needs determined, and will expect her to progress well with rehab.  Monitor O2 sats, as was 95% at baseline, 83% after walking and 100% after short rest.  See acutely and monitor for sats, determine if supplemental O2 will need to go home as well.    Follow Up Recommendations Home health PT;Supervision - Intermittent    Equipment Recommendations   None needed, has all AD's at home    Recommendations for Other Services  NA     Precautions / Restrictions Precautions Precautions: Fall Precaution Comments: HOH Restrictions Weight Bearing Restrictions: No  Monitor O2 sats    Mobility  Bed Mobility Overal bed mobility: Needs Assistance Bed Mobility: Supine to Sit;Sit to Supine     Supine to sit: Min assist Sit to supine: Min guard      Transfers Overall transfer level: Needs assistance Equipment used: 1 person hand held assist Transfers: Sit to/from Stand Sit to Stand: Min guard            Ambulation/Gait Ambulation/Gait assistance: Min guard Gait Distance (Feet): 130 Feet Assistive device: 1 person hand held assist Gait Pattern/deviations: Step-through pattern;Decreased stride length;Wide base of support Gait velocity: reduced   General Gait Details: pt has altered configuration of toes on her feet,   Stairs            Wheelchair Mobility    Modified Rankin (Stroke Patients  Only)       Balance Overall balance assessment: History of Falls                                           Pertinent Vitals/Pain      Home Living Family/patient expects to be discharged to:: Private residence Living Arrangements: Children Available Help at Discharge: Family;Friend(s);Available 24 hours/day Type of Home: House Home Access: Stairs to enter;Ramped entrance Entrance Stairs-Rails: Right Entrance Stairs-Number of Steps: 5 Home Layout: One level Home Equipment: Walker - 2 wheels;Walker - 4 wheels;Cane - single point;Bedside commode;Shower seat      Prior Function Level of Independence: Independent(driving and out to do errands)               Hand Dominance   Dominant Hand: Right    Extremity/Trunk Assessment   Upper Extremity Assessment Upper Extremity Assessment: Overall WFL for tasks assessed    Lower Extremity Assessment Lower Extremity Assessment: Generalized weakness    Cervical / Trunk Assessment Cervical / Trunk Assessment: Kyphotic  Communication   Communication: No difficulties  Cognition Arousal/Alertness: Lethargic;Awake/alert Behavior During Therapy: WFL for tasks assessed/performed Overall Cognitive Status: Within Functional Limits for tasks assessed  General Comments General comments (skin integrity, edema, etc.): has several recent falls that are a result of minor listing over    Exercises     Assessment/Plan    PT Assessment Patient needs continued PT services  PT Problem List Decreased strength;Decreased activity tolerance;Decreased range of motion;Decreased balance;Decreased mobility;Decreased coordination;Decreased cognition;Decreased knowledge of use of DME;Decreased safety awareness;Decreased knowledge of precautions;Obesity       PT Treatment Interventions DME instruction;Gait training;Stair training;Functional mobility training;Therapeutic  activities;Therapeutic exercise;Balance training;Neuromuscular re-education;Patient/family education    PT Goals (Current goals can be found in the Care Plan section)  Acute Rehab PT Goals PT Goal Formulation: With patient Time For Goal Achievement: 11/08/18 Potential to Achieve Goals: Good    Frequency Min 3X/week   Barriers to discharge Inaccessible home environment mult stairs to enter house    Co-evaluation               AM-PAC PT "6 Clicks" Mobility  Outcome Measure Help needed turning from your back to your side while in a flat bed without using bedrails?: A Little Help needed moving from lying on your back to sitting on the side of a flat bed without using bedrails?: A Little Help needed moving to and from a bed to a chair (including a wheelchair)?: A Little Help needed standing up from a chair using your arms (e.g., wheelchair or bedside chair)?: A Little Help needed to walk in hospital room?: A Little Help needed climbing 3-5 steps with a railing? : A Lot 6 Click Score: 17    End of Session Equipment Utilized During Treatment: Gait belt Activity Tolerance: Patient tolerated treatment well;Patient limited by fatigue Patient left: in bed;with call bell/phone within reach;with bed alarm set Nurse Communication: Mobility status PT Visit Diagnosis: Unsteadiness on feet (R26.81);Muscle weakness (generalized) (M62.81)    Time: 0383-3383 PT Time Calculation (min) (ACUTE ONLY): 15 min   Charges:   PT Evaluation $PT Eval Moderate Complexity: 1 Mod         Ramond Dial 10/25/2018, 3:31 PM   Mee Hives, PT MS Acute Rehab Dept. Number: [place] and [place]

## 2018-10-25 NOTE — Discharge Instructions (Signed)

## 2018-10-26 LAB — URINE CULTURE: Culture: >=100000 — AB

## 2018-10-26 LAB — CULTURE, BLOOD (ROUTINE X 2)
Special Requests: ADEQUATE
Special Requests: ADEQUATE

## 2018-10-26 MED ORDER — FUROSEMIDE 40 MG PO TABS
40.0000 mg | ORAL_TABLET | Freq: Every day | ORAL | Status: DC
  Administered 2018-10-26 – 2018-10-27 (×2): 40 mg via ORAL
  Filled 2018-10-26 (×2): qty 1

## 2018-10-26 NOTE — Progress Notes (Signed)
[facility] PROGRESS NOTE  SHAKENA CALLARI ZOX:[id number] DOB: [birth year] DOA: 10/23/2018 PCP: [name], MD  Brief summary   78 y.o. female with medical history significant of hypertension, hyperlipidemia, COPD, stroke, GERD, hypothyroidism, atrial fibrillation on Eliquis, dCHF, breast cancer (left mastectomy), marginal zone lymphoma under observation, CKD stage III, obesity, who presents with fever, chills, bilateral flank pain.  ED Course: pt was found to have positive urinalysis (cloudy appearance, many bacteria, moderate amount of leukocyte, WBC >50), negative COVID-19 test, WBC 7.4, lactic acid 1.6, stable renal function, temperature one 1.7, blood pressure 106/78, tachycardia, tachypnea, oxygen saturation 94% on room air.  Patient is admitted to telemetry bed as inpatient.  Assessment/Plan:  Sepsis due to acute pyelonephritis: bacteremia. Patient meets critical for sepsis with fever, tachycardia and tachypnea. remains hemodynamically stable. Lactic acid is normal. Blood culture (7/26): perlim e coli. Urine culture+e coli.   -improving.  cont Ceftriaxone IV. possible transition to oral regimen in 24-48 hrs. F/u culture. Monitor   Essential hypertension: on propranolol. monitor. Prn hydralazine   Atrial fibrillation ([place]): CHA2DS2-VASc Score is 5, needs oral anticoagulation. Patient is Eliquis at home. Heart rate is 100 s. continue Eliquis and propranolol  History of CVA (cerebrovascular accident): no new neuro symptoms. on Eliquis, Lipitor and fenofibrate  Hyperlipidemia: cont Lipitor and fenofibrate  Chronic diastolic heart failure ([place]): 2D echo on 06/10/2016 showed EF of 55-60%. Has trace leg edema, but no JVD.  No pulmonary edema chest x-ray. CHF symptoms compensated. Resumed lasix on 7/28  COPD (chronic obstructive pulmonary disease) ([place]): no wheezing on exam. 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 [name]. Follow-up with hematologist  GERD (gastroesophageal reflux disease); cont Protonix  Hypothyroidism: Continue Synthroid  CKD-III: stable.  Baseline creatinine 1.3-1.5.  Her creatinine is 1.57, BUN 41. Monitor   Code Status: partial  Family Communication: d/w patient, Therapist, sports. Recently Updated her family (indicate person spoken with, relationship, and if by phone, the number) Disposition Plan: remains inpatient for 24-48 hrs.   Consultants:  none  Procedures:  none  Antibiotics: Anti-infectives (From admission, onward)   Start     Dose/Rate Route Frequency Ordered Stop   10/24/18 2200  cefTRIAXone (ROCEPHIN) 1 g in sodium chloride 0.9 % 100 mL IVPB  Status:  Discontinued     1 g 200 mL/hr over 30 Minutes Intravenous Every 24 hours 10/24/18 0258 10/24/18 1424   10/24/18 2200  cefTRIAXone (ROCEPHIN) 2 g in sodium chloride 0.9 % 100 mL IVPB     2 g 200 mL/hr over 30 Minutes Intravenous Every 24 hours 10/24/18 1424     10/23/18 2315  cefTRIAXone (ROCEPHIN) 1 g in sodium chloride 0.9 % 100 mL IVPB     1 g 200 mL/hr over 30 Minutes Intravenous  Once 10/23/18 2305 10/24/18 0100       (indicate start date, and stop date if known)  HPI/Subjective: No distress. No acute issues. Reports feeling well. Afebrile. Improving,.   Objective: Vitals:   10/26/18 0842 10/26/18 0931  BP: 121/68   Pulse: 83 77  Resp: 20   Temp: 97.8 F (36.6 C)   SpO2: 93%     Intake/Output Summary (Last 24 hours) at 10/26/2018 1011 Last data filed at 10/26/2018 0948 Gross per 24 hour  Intake 930 ml  Output -  Net 930 ml   Filed Weights   10/24/18 0259 10/25/18 0328 10/26/18 0447  Weight: 94.2 kg 96.1  kg 97.5 kg    Exam:   General:  No distress   Cardiovascular: s1,s2 rrr  Respiratory: diminished at bases   Abdomen: soft, nt   Musculoskeletal: no leg edema    Data Reviewed: Basic Metabolic Panel: Recent Labs  Lab 10/23/18 2200  10/24/18 0354 10/25/18 0335  NA 133* 132* 134*  K 4.3 3.5 3.7  CL 98 99 101  CO2 24 24 21*  GLUCOSE 142* 118* 98  BUN 41* 38* 29*  CREATININE 1.57* 1.41* 1.16*  CALCIUM 9.2 8.7* 8.6*   Liver Function Tests: Recent Labs  Lab 10/23/18 2200 10/25/18 0335  AST 40 41  ALT 17 21  ALKPHOS 37* 32*  BILITOT 1.6* 1.5*  PROT 8.6* 7.3  ALBUMIN 3.3* 2.7*   No results for input(s): LIPASE, AMYLASE in the last 168 hours. No results for input(s): AMMONIA in the last 168 hours. CBC: Recent Labs  Lab 10/23/18 2200 10/24/18 0354 10/25/18 0335  WBC 7.4 6.3 5.4  NEUTROABS 6.5  --  4.3  HGB 11.5* 10.7* 9.8*  HCT 35.8* 34.7* 30.7*  MCV 91.1 92.5 91.1  PLT 279 239 256   Cardiac Enzymes: No results for input(s): CKTOTAL, CKMB, CKMBINDEX, TROPONINI in the last 168 hours. BNP (last 3 results) Recent Labs    05/09/18 1543 10/24/18 0354  BNP 548.5* 498.3*    ProBNP (last 3 results) No results for input(s): PROBNP in the last 8760 hours.  CBG: No results for input(s): GLUCAP in the last 168 hours.  Recent Results (from the past 240 hour(s))  Urine culture     Status: Abnormal   Collection Time: 10/23/18  9:38 PM   Specimen: Urine, Random  Result Value Ref Range Status   Specimen Description URINE, RANDOM  Final   Special Requests   Final    NONE Performed at [facility], [address], [place], [place] [address]    Culture >=100,000 COLONIES/mL ESCHERICHIA COLI (A)  Final   Report Status 10/26/2018 FINAL  Final   Organism ID, Bacteria ESCHERICHIA COLI (A)  Final      Susceptibility   Escherichia coli - MIC*    AMPICILLIN 4 SENSITIVE Sensitive     CEFAZOLIN <=4 SENSITIVE Sensitive     CEFTRIAXONE <=1 SENSITIVE Sensitive     CIPROFLOXACIN <=0.25 SENSITIVE Sensitive     GENTAMICIN <=1 SENSITIVE Sensitive     IMIPENEM <=0.25 SENSITIVE Sensitive     NITROFURANTOIN <=16 SENSITIVE Sensitive     TRIMETH/SULFA <=20 SENSITIVE Sensitive     AMPICILLIN/SULBACTAM <=2  SENSITIVE Sensitive     PIP/TAZO <=4 SENSITIVE Sensitive     Extended ESBL NEGATIVE Sensitive     * >=100,000 COLONIES/mL ESCHERICHIA COLI  Culture, blood (Routine x 2)     Status: Abnormal   Collection Time: 10/23/18 11:40 PM   Specimen: BLOOD RIGHT HAND  Result Value Ref Range Status   Specimen Description BLOOD RIGHT HAND  Final   Special Requests   Final    BOTTLES DRAWN AEROBIC AND ANAEROBIC Blood Culture adequate volume   Culture  Setup Time GRAM NEGATIVE RODS ANAEROBIC BOTTLE ONLY   Final   Culture (A)  Final    ESCHERICHIA COLI SUSCEPTIBILITIES PERFORMED ON PREVIOUS CULTURE WITHIN THE LAST 5 DAYS. Performed at [facility], [place] [address], [place], [place] [address]    Report Status 10/26/2018 FINAL  Final  SARS Coronavirus 2 (CEPHEID - Performed in [facility] hospital lab), [facility]  Status: None   Collection Time: 10/23/18 11:45 PM   Specimen: Nasopharyngeal Swab  Result Value Ref Range Status   SARS Coronavirus 2 NEGATIVE NEGATIVE Final    Comment: (NOTE) If result is NEGATIVE SARS-CoV-2 target nucleic acids are NOT DETECTED. The SARS-CoV-2 RNA is generally detectable in upper and lower  respiratory specimens during the acute phase of infection. The lowest  concentration of SARS-CoV-2 viral copies this assay can detect is 250  copies / mL. A negative result does not preclude SARS-CoV-2 infection  and should not be used as the sole basis for treatment or other  patient management decisions.  A negative result may occur with  improper specimen collection / handling, submission of specimen other  than nasopharyngeal swab, presence of viral mutation(s) within the  areas targeted by this assay, and inadequate number of viral copies  (<250 copies / mL). A negative result must be combined with clinical  observations, patient history, and epidemiological information. If result is POSITIVE SARS-CoV-2 target nucleic acids are DETECTED. The SARS-CoV-2 RNA is  generally detectable in upper and lower  respiratory specimens dur ing the acute phase of infection.  Positive  results are indicative of active infection with SARS-CoV-2.  Clinical  correlation with patient history and other diagnostic information is  necessary to determine patient infection status.  Positive results do  not rule out bacterial infection or co-infection with other viruses. If result is PRESUMPTIVE POSTIVE SARS-CoV-2 nucleic acids MAY BE PRESENT.   A presumptive positive result was obtained on the submitted specimen  and confirmed on repeat testing.  While 2019 novel coronavirus  (SARS-CoV-2) nucleic acids may be present in the submitted sample  additional confirmatory testing may be necessary for epidemiological  and / or clinical management purposes  to differentiate between  SARS-CoV-2 and other Sarbecovirus currently known to infect humans.  If clinically indicated additional testing with an alternate test  methodology [phone]) is advised. The SARS-CoV-2 RNA is generally  detectable in upper and lower respiratory sp ecimens during the acute  phase of infection. The expected result is Negative. Fact Sheet for Patients:  StrictlyIdeas.no Fact Sheet for Healthcare Providers: BankingDealers.co.za This test is not yet approved or cleared by the [place] FDA and has been authorized for detection and/or diagnosis of SARS-CoV-2 by FDA under an Emergency Use Authorization (EUA).  This EUA will remain in effect (meaning this test can be used) for the duration of the COVID-19 declaration under Section 564(b)(1) of the Act, 21 U.S.C. section 360bbb-3(b)(1), unless the authorization is terminated or revoked sooner. Performed at [facility], [place] [address], [place], [place] [address]   Culture, blood (Routine x 2)     Status: Abnormal   Collection Time: 10/24/18 12:05 AM   Specimen: BLOOD LEFT HAND  Result Value Ref  Range Status   Specimen Description BLOOD LEFT HAND  Final   Special Requests   Final    BOTTLES DRAWN AEROBIC AND ANAEROBIC Blood Culture adequate volume   Culture  Setup Time   Final    GRAM NEGATIVE RODS IN BOTH AEROBIC AND ANAEROBIC BOTTLES CRITICAL RESULT CALLED TO, READ BACK BY AND VERIFIED WITH: Cathlyn Parsons [id number] FCP Performed at [facility], [place] [address] [place],  [address]    Culture ESCHERICHIA COLI (A)  Final   Report Status 10/26/2018 FINAL  Final   Organism ID, Bacteria ESCHERICHIA COLI  Final      Susceptibility   Escherichia coli -  MIC*    AMPICILLIN 8 SENSITIVE Sensitive     CEFAZOLIN <=4 SENSITIVE Sensitive     CEFEPIME <=1 SENSITIVE Sensitive     CEFTAZIDIME <=1 SENSITIVE Sensitive     CEFTRIAXONE <=1 SENSITIVE Sensitive     CIPROFLOXACIN <=0.25 SENSITIVE Sensitive     GENTAMICIN <=1 SENSITIVE Sensitive     IMIPENEM <=0.25 SENSITIVE Sensitive     TRIMETH/SULFA <=20 SENSITIVE Sensitive     AMPICILLIN/SULBACTAM 4 SENSITIVE Sensitive     PIP/TAZO <=4 SENSITIVE Sensitive     Extended ESBL NEGATIVE Sensitive     * ESCHERICHIA COLI  Blood Culture ID Panel (Reflexed)     Status: Abnormal   Collection Time: 10/24/18 12:05 AM  Result Value Ref Range Status   Enterococcus species NOT DETECTED NOT DETECTED Final   Listeria monocytogenes NOT DETECTED NOT DETECTED Final   Staphylococcus species NOT DETECTED NOT DETECTED Final   Staphylococcus aureus (BCID) NOT DETECTED NOT DETECTED Final   Streptococcus species NOT DETECTED NOT DETECTED Final   Streptococcus agalactiae NOT DETECTED NOT DETECTED Final   Streptococcus pneumoniae NOT DETECTED NOT DETECTED Final   Streptococcus pyogenes NOT DETECTED NOT DETECTED Final   Acinetobacter baumannii NOT DETECTED NOT DETECTED Final   Enterobacteriaceae species DETECTED (A) NOT DETECTED Final    Comment: Enterobacteriaceae represent a large family of gram-negative bacteria, not a single  organism. CRITICAL RESULT CALLED TO, READ BACK BY AND VERIFIED WITH: [place], E. [id number] FCP    Enterobacter cloacae complex NOT DETECTED NOT DETECTED Final   Escherichia coli DETECTED (A) NOT DETECTED Final    Comment: CRITICAL RESULT CALLED TO, READ BACK BY AND VERIFIED WITH: PHARMD MARTIN, E. [id number] FCP    Klebsiella oxytoca NOT DETECTED NOT DETECTED Final   Klebsiella pneumoniae NOT DETECTED NOT DETECTED Final   Proteus species NOT DETECTED NOT DETECTED Final   Serratia marcescens NOT DETECTED NOT DETECTED Final   Carbapenem resistance NOT DETECTED NOT DETECTED Final   Haemophilus influenzae NOT DETECTED NOT DETECTED Final   Neisseria meningitidis NOT DETECTED NOT DETECTED Final   Pseudomonas aeruginosa NOT DETECTED NOT DETECTED Final   Candida albicans NOT DETECTED NOT DETECTED Final   Candida glabrata NOT DETECTED NOT DETECTED Final   Candida krusei NOT DETECTED NOT DETECTED Final   Candida parapsilosis NOT DETECTED NOT DETECTED Final   Candida tropicalis NOT DETECTED NOT DETECTED Final    Comment: Performed at [facility], [place]. [address], [place], [place] [address]     Studies: Ct Abdomen Pelvis Wo Contrast  Result Date: 10/24/2018 CLINICAL DATA:  Bilateral flank pain and hematuria. Recurrent urinary tract infections. Low-grade follicular lymphoma. EXAM: CT ABDOMEN AND PELVIS WITHOUT CONTRAST TECHNIQUE: Multidetector CT imaging of the abdomen and pelvis was performed following the standard protocol without IV contrast. COMPARISON:  08/19/2018 FINDINGS: Lower chest: Stable small right pleural effusion. Stable lymphadenopathy in inferior mediastinum in the right paracentral region measuring 2.8 cm on image 7/3. Hepatobiliary: No mass visualized on this unenhanced exam. Calcified gallstones are again seen, however there is no evidence of cholecystitis or biliary ductal dilatation. Pancreas: No mass or inflammatory process visualized on this unenhanced exam.  Spleen: Stable splenomegaly measuring approximately 17 cm in length. Adrenals/Urinary tract: No evidence of urolithiasis or hydronephrosis. New asymmetric left perinephric soft tissue stranding is seen, suspicious for pyelonephritis. Bladder not well visualized due to beam hardening artifact from left hip prosthesis. Stomach/Bowel: No evidence of obstruction, inflammatory process, or abnormal fluid collections. Diverticulosis is seen  mainly involving the descending and sigmoid colon, however there is no evidence of diverticulitis. Vascular/Lymphatic: Stable sub-cm lymph nodes in gastrohepatic ligament. Abdominal retroperitoneal lymphadenopathy throughout the left paraaortic and aortocaval spaces also shows no significant change. Index area in the left paraaortic region measures 2.9 cm in short axis on image 33/3, compared to 3.0 cm previously. No new or increased sites of lymphadenopathy are identified. Aortic atherosclerosis. No evidence of abdominal aortic aneurysm. Reproductive: Inferior pelvis is obscured by significant beam hardening artifact from left hip prosthesis. Other:  None. Musculoskeletal:  No suspicious bone lesions identified. IMPRESSION: 1. New asymmetric left perinephric soft tissue stranding, suspicious for pyelonephritis. No evidence of urolithiasis or hydronephrosis. 2. Stable abdominal and inferior mediastinal lymphadenopathy. No evidence of new or progressive disease. 3. Stable splenomegaly. 4. Cholelithiasis. No radiographic evidence of cholecystitis. 5. Colonic diverticulosis. No radiographic evidence of diverticulitis. 6. Stable small right pleural effusion. Aortic Atherosclerosis (ICD10-I70.0). Electronically Signed   By: Marlaine Hind M.D.   On: 10/24/2018 13:00    Scheduled Meds: . apixaban  5 mg Oral BID  . atorvastatin  20 mg Oral QHS  . dextromethorphan-guaiFENesin  1 tablet Oral BID  . fenofibrate  160 mg Oral QHS  . levothyroxine  175 mcg Oral QAC breakfast  . multivitamin  with minerals  1 tablet Oral Daily  . propranolol ER  120 mg Oral Daily  . propranolol ER  60 mg Oral QHS   Continuous Infusions: . cefTRIAXone (ROCEPHIN)  IV Stopped (10/25/18 2123)    Principal Problem:   Acute pyelonephritis Active Problems:   Essential hypertension   Atrial fibrillation (HCC)   History of CVA (cerebrovascular accident)   Long term current use of anticoagulant therapy   Hyperlipidemia   Chronic diastolic heart failure (HCC)   COPD (chronic obstructive pulmonary disease) (HCC)   Marginal zone lymphoma (HCC)   GERD (gastroesophageal reflux disease)   Hypothyroidism   Sepsis ([place])   CKD (chronic kidney disease), stage III ([place])    Time spent: >35 minutes     Kinnie Feil  [facility] Pager [phone]. If 7PM-7AM, please contact night-coverage at [email or web address], password [facility] 10/26/2018, 10:11 AM  LOS: 2 days

## 2018-10-27 ENCOUNTER — Other Ambulatory Visit: Payer: Self-pay | Admitting: Pharmacist

## 2018-10-27 ENCOUNTER — Inpatient Hospital Stay (HOSPITAL_COMMUNITY): Payer: PPO

## 2018-10-27 DIAGNOSIS — I350 Nonrheumatic aortic (valve) stenosis: Secondary | ICD-10-CM

## 2018-10-27 LAB — ECHOCARDIOGRAM COMPLETE
Height: 66 in
Weight: 3428.59 oz

## 2018-10-27 MED ORDER — FUROSEMIDE 10 MG/ML IJ SOLN
40.0000 mg | Freq: Two times a day (BID) | INTRAMUSCULAR | Status: DC
  Administered 2018-10-27 – 2018-10-28 (×2): 40 mg via INTRAVENOUS
  Filled 2018-10-27 (×2): qty 4

## 2018-10-27 MED ORDER — IPRATROPIUM-ALBUTEROL 0.5-2.5 (3) MG/3ML IN SOLN
3.0000 mL | Freq: Four times a day (QID) | RESPIRATORY_TRACT | Status: DC
  Administered 2018-10-27: 3 mL via RESPIRATORY_TRACT
  Filled 2018-10-27 (×2): qty 3

## 2018-10-27 MED ORDER — PREDNISONE 20 MG PO TABS
60.0000 mg | ORAL_TABLET | Freq: Every day | ORAL | Status: DC
  Administered 2018-10-27 – 2018-10-28 (×2): 60 mg via ORAL
  Filled 2018-10-27 (×2): qty 3

## 2018-10-27 MED ORDER — IPRATROPIUM-ALBUTEROL 0.5-2.5 (3) MG/3ML IN SOLN: 3.0000 mL | Freq: Four times a day (QID) | RESPIRATORY_TRACT | Status: DC

## 2018-10-27 MED ORDER — BUDESONIDE 0.25 MG/2ML IN SUSP
0.2500 mg | Freq: Two times a day (BID) | RESPIRATORY_TRACT | Status: DC
  Administered 2018-10-27 – 2018-10-28 (×2): 0.25 mg via RESPIRATORY_TRACT
  Filled 2018-10-27 (×3): qty 2

## 2018-10-27 NOTE — Progress Notes (Signed)
[facility] PROGRESS NOTE  OLUWADARASIMI FAVOR DTO:[id number] DOB: [birth year] DOA: 10/23/2018 PCP: [name], MD  Brief summary   78 y.o. female with medical history significant of hypertension, hyperlipidemia, COPD, stroke, GERD, hypothyroidism, atrial fibrillation on Eliquis, dCHF, breast cancer (left mastectomy), marginal zone lymphoma under observation, CKD stage III, obesity, who presents with fever, chills, bilateral flank pain.  ED Course: pt was found to have positive urinalysis (cloudy appearance, many bacteria, moderate amount of leukocyte, WBC >50), negative COVID-19 test, WBC 7.4, lactic acid 1.6, stable renal function, temperature one 1.7, blood pressure 106/78, tachycardia, tachypnea, oxygen saturation 94% on room air.  Patient is admitted to telemetry bed as inpatient.  10/27/2018: Patient seen.  Patient is dyspneic on minimal exertion.  Patient is volume overloaded.  Very decreased air entry.  Prior history of COPD.  Will not discharge patient today.  Will change Lasix to IV.  Will start patient on nebs Pulmicort and nebs DuoNeb.  We will also introduce oral prednisone.  Review respiratory symptoms/status tomorrow and decide on disposition.  Assessment/Plan:  Shortness of breath/dyspnea on minimal exertion:  -Last echocardiogram was in March 2018. -Echo revealed normal EF, and diastolic function could not be assessed, mild aortic stenosis, elevated RVSP (46 mmHg). -Repeat echocardiogram. -Change oral Lasix to IV Lasix and increase the dose to 40 Mg twice daily. -Start patient on nebs Pulmicort and DuoNeb. -Start patient on prednisone 60 mg p.o. once daily.  Patient has a history of COPD. -Further management will depend on hospital course.  Possible acute on chronic diastolic CHF: -See above.  Possible COPD with exacerbation: -Prednisone, and neb treatment as above.  Sepsis due to acute pyelonephritis:   Patient meets critical for sepsis with fever, tachycardia and  tachypnea. remains hemodynamically stable. Lactic acid is normal. Blood culture (7/26): perlim e coli. Urine culture+e coli.   -improving.  cont Ceftriaxone IV. possible transition to oral regimen in 24-48 hrs. F/u culture. Monitor 10/27/2018: Complete course of antibiotics.  Essential hypertension:  on propranolol. monitor. Prn hydralazine  10/27/2018: Controlled.  Last blood pressure was 128/80 mmHg.  Atrial fibrillation [facility]):  CHA2DS2-VASc Score is 5, needs oral anticoagulation. Patient is Eliquis at home. Heart rate is 100 s. continue Eliquis and propranolol  History of CVA (cerebrovascular accident): No new neuro symptoms. 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 [name]. Follow-up with hematologist  GERD (gastroesophageal reflux disease); cont Protonix  Hypothyroidism: Continue Synthroid  CKD-III: stable.  Baseline creatinine 1.3-1.5.  Her creatinine is 1.57, BUN 41. Monitor   Code Status: partial  Family Communication:  Disposition Plan: remains inpatient for 24-48 hrs.   Consultants:  none  Procedures:  none  Antibiotics: Anti-infectives (From admission, onward)   Start     Dose/Rate Route Frequency Ordered Stop   10/24/18 2200  cefTRIAXone (ROCEPHIN) 1 g in sodium chloride 0.9 % 100 mL IVPB  Status:  Discontinued     1 g 200 mL/hr over 30 Minutes Intravenous Every 24 hours 10/24/18 0258 10/24/18 1424   10/24/18 2200  cefTRIAXone (ROCEPHIN) 2 g in sodium chloride 0.9 % 100 mL IVPB     2 g 200 mL/hr over 30 Minutes Intravenous Every 24 hours 10/24/18 1424     10/23/18 2315  cefTRIAXone (ROCEPHIN) 1 g in sodium chloride 0.9 % 100 mL IVPB     1 g 200 mL/hr over 30 Minutes Intravenous  Once 10/23/18 2305  10/24/18 0100       (indicate start date, and stop date if known)  HPI/Subjective: No distress. No acute issues. Reports feeling  well. Afebrile. Improving,.   Objective: Vitals:   10/27/18 0537 10/27/18 0848  BP: 128/80 128/80  Pulse: 77 81  Resp: 17 17  Temp: 98.2 F (36.8 C) 98.1 F (36.7 C)  SpO2: 95% 98%   No intake or output data in the 24 hours ending 10/27/18 1507 Filed Weights   10/25/18 0328 10/26/18 0447 10/27/18 0537  Weight: 96.1 kg 97.5 kg 97.2 kg    Exam:   General: Dyspneic on minimal exertion.  Obese.     Cardiovascular: S1-S2.  Respiratory: Very decreased air entry globally.  Abdomen: Obese, soft and nontender.  Organs are difficult to assess.    Musculoskeletal: Bilateral leg edema.    Neuro: Patient is awake and alert.  Patient moves all extremities.  Data Reviewed: Basic Metabolic Panel: Recent Labs  Lab 10/23/18 2200 10/24/18 0354 10/25/18 0335  NA 133* 132* 134*  K 4.3 3.5 3.7  CL 98 99 101  CO2 24 24 21*  GLUCOSE 142* 118* 98  BUN 41* 38* 29*  CREATININE 1.57* 1.41* 1.16*  CALCIUM 9.2 8.7* 8.6*   Liver Function Tests: Recent Labs  Lab 10/23/18 2200 10/25/18 0335  AST 40 41  ALT 17 21  ALKPHOS 37* 32*  BILITOT 1.6* 1.5*  PROT 8.6* 7.3  ALBUMIN 3.3* 2.7*   No results for input(s): LIPASE, AMYLASE in the last 168 hours. No results for input(s): AMMONIA in the last 168 hours. CBC: Recent Labs  Lab 10/23/18 2200 10/24/18 0354 10/25/18 0335  WBC 7.4 6.3 5.4  NEUTROABS 6.5  --  4.3  HGB 11.5* 10.7* 9.8*  HCT 35.8* 34.7* 30.7*  MCV 91.1 92.5 91.1  PLT 279 239 256   Cardiac Enzymes: No results for input(s): CKTOTAL, CKMB, CKMBINDEX, TROPONINI in the last 168 hours. BNP (last 3 results) Recent Labs    05/09/18 1543 10/24/18 0354  BNP 548.5* 498.3*    ProBNP (last 3 results) No results for input(s): PROBNP in the last 8760 hours.  CBG: No results for input(s): GLUCAP in the last 168 hours.  Recent Results (from the past 240 hour(s))  Urine culture     Status: Abnormal   Collection Time: 10/23/18  9:38 PM   Specimen: Urine, Random   Result Value Ref Range Status   Specimen Description URINE, RANDOM  Final   Special Requests   Final    NONE Performed at [facility], [address] [place], [place] [address]    Culture >=100,000 COLONIES/mL ESCHERICHIA COLI (A)  Final   Report Status 10/26/2018 FINAL  Final   Organism ID, Bacteria ESCHERICHIA COLI (A)  Final      Susceptibility   Escherichia coli - MIC*    AMPICILLIN 4 SENSITIVE Sensitive     CEFAZOLIN <=4 SENSITIVE Sensitive     CEFTRIAXONE <=1 SENSITIVE Sensitive     CIPROFLOXACIN <=0.25 SENSITIVE Sensitive     GENTAMICIN <=1 SENSITIVE Sensitive     IMIPENEM <=0.25 SENSITIVE Sensitive     NITROFURANTOIN <=16 SENSITIVE Sensitive     TRIMETH/SULFA <=20 SENSITIVE Sensitive     AMPICILLIN/SULBACTAM <=2 SENSITIVE Sensitive     PIP/TAZO <=4 SENSITIVE Sensitive     Extended ESBL NEGATIVE Sensitive     * >=100,000 COLONIES/mL ESCHERICHIA COLI  Culture, blood (Routine x 2)     Status: Abnormal   Collection Time:  10/23/18 11:40 PM   Specimen: BLOOD RIGHT HAND  Result Value Ref Range Status   Specimen Description BLOOD RIGHT HAND  Final   Special Requests   Final    BOTTLES DRAWN AEROBIC AND ANAEROBIC Blood Culture adequate volume   Culture  Setup Time GRAM NEGATIVE RODS ANAEROBIC BOTTLE ONLY   Final   Culture (A)  Final    ESCHERICHIA COLI SUSCEPTIBILITIES PERFORMED ON PREVIOUS CULTURE WITHIN THE LAST 5 DAYS. Performed at [facility], [place] [address] [place], [place] [address]    Report Status 10/26/2018 FINAL  Final  SARS Coronavirus 2 (CEPHEID - Performed in [place] hospital lab), Hosp Order     Status: None   Collection Time: 10/23/18 11:45 PM   Specimen: Nasopharyngeal Swab  Result Value Ref Range Status   SARS Coronavirus 2 NEGATIVE NEGATIVE Final    Comment: (NOTE) If result is NEGATIVE SARS-CoV-2 target nucleic acids are NOT DETECTED. The SARS-CoV-2 RNA is generally detectable in upper and lower  respiratory specimens during the  acute phase of infection. The lowest  concentration of SARS-CoV-2 viral copies this assay can detect is 250  copies / mL. A negative result does not preclude SARS-CoV-2 infection  and should not be used as the sole basis for treatment or other  patient management decisions.  A negative result may occur with  improper specimen collection / handling, submission of specimen other  than nasopharyngeal swab, presence of viral mutation(s) within the  areas targeted by this assay, and inadequate number of viral copies  (<250 copies / mL). A negative result must be combined with clinical  observations, patient history, and epidemiological information. If result is POSITIVE SARS-CoV-2 target nucleic acids are DETECTED. The SARS-CoV-2 RNA is generally detectable in upper and lower  respiratory specimens dur ing the acute phase of infection.  Positive  results are indicative of active infection with SARS-CoV-2.  Clinical  correlation with patient history and other diagnostic information is  necessary to determine patient infection status.  Positive results do  not rule out bacterial infection or co-infection with other viruses. If result is PRESUMPTIVE POSTIVE SARS-CoV-2 nucleic acids MAY BE PRESENT.   A presumptive positive result was obtained on the submitted specimen  and confirmed on repeat testing.  While 2019 novel coronavirus  (SARS-CoV-2) nucleic acids may be present in the submitted sample  additional confirmatory testing may be necessary for epidemiological  and / or clinical management purposes  to differentiate between  SARS-CoV-2 and other Sarbecovirus currently known to infect humans.  If clinically indicated additional testing with an alternate test  methodology [phone]) is advised. The SARS-CoV-2 RNA is generally  detectable in upper and lower respiratory sp ecimens during the acute  phase of infection. The expected result is Negative. Fact Sheet for Patients:   StrictlyIdeas.no Fact Sheet for Healthcare Providers: BankingDealers.co.za This test is not yet approved or cleared by the [place] FDA and has been authorized for detection and/or diagnosis of SARS-CoV-2 by FDA under an Emergency Use Authorization (EUA).  This EUA will remain in effect (meaning this test can be used) for the duration of the COVID-19 declaration under Section 564(b)(1) of the Act, 21 U.S.C. section 360bbb-3(b)(1), unless the authorization is terminated or revoked sooner. Performed at [facility], [place] [address], [place], [place] [address]   Culture, blood (Routine x 2)     Status: Abnormal   Collection Time: 10/24/18 12:05 AM   Specimen: BLOOD LEFT HAND  Result  Value Ref Range Status   Specimen Description BLOOD LEFT HAND  Final   Special Requests   Final    BOTTLES DRAWN AEROBIC AND ANAEROBIC Blood Culture adequate volume   Culture  Setup Time   Final    GRAM NEGATIVE RODS IN BOTH AEROBIC AND ANAEROBIC BOTTLES CRITICAL RESULT CALLED TO, READ BACK BY AND VERIFIED WITH: Cathlyn Parsons [id number] FCP Performed at [facility], [place] [address], [place], [place] [address]    Culture ESCHERICHIA COLI (A)  Final   Report Status 10/26/2018 FINAL  Final   Organism ID, Bacteria ESCHERICHIA COLI  Final      Susceptibility   Escherichia coli - MIC*    AMPICILLIN 8 SENSITIVE Sensitive     CEFAZOLIN <=4 SENSITIVE Sensitive     CEFEPIME <=1 SENSITIVE Sensitive     CEFTAZIDIME <=1 SENSITIVE Sensitive     CEFTRIAXONE <=1 SENSITIVE Sensitive     CIPROFLOXACIN <=0.25 SENSITIVE Sensitive     GENTAMICIN <=1 SENSITIVE Sensitive     IMIPENEM <=0.25 SENSITIVE Sensitive     TRIMETH/SULFA <=20 SENSITIVE Sensitive     AMPICILLIN/SULBACTAM 4 SENSITIVE Sensitive     PIP/TAZO <=4 SENSITIVE Sensitive     Extended ESBL NEGATIVE Sensitive     * ESCHERICHIA COLI  Blood Culture ID Panel (Reflexed)     Status: Abnormal    Collection Time: 10/24/18 12:05 AM  Result Value Ref Range Status   Enterococcus species NOT DETECTED NOT DETECTED Final   Listeria monocytogenes NOT DETECTED NOT DETECTED Final   Staphylococcus species NOT DETECTED NOT DETECTED Final   Staphylococcus aureus (BCID) NOT DETECTED NOT DETECTED Final   Streptococcus species NOT DETECTED NOT DETECTED Final   Streptococcus agalactiae NOT DETECTED NOT DETECTED Final   Streptococcus pneumoniae NOT DETECTED NOT DETECTED Final   Streptococcus pyogenes NOT DETECTED NOT DETECTED Final   Acinetobacter baumannii NOT DETECTED NOT DETECTED Final   Enterobacteriaceae species DETECTED (A) NOT DETECTED Final    Comment: Enterobacteriaceae represent a large family of gram-negative bacteria, not a single organism. CRITICAL RESULT CALLED TO, READ BACK BY AND VERIFIED WITH: [place], E. 1518 [id number] FCP    Enterobacter cloacae complex NOT DETECTED NOT DETECTED Final   Escherichia coli DETECTED (A) NOT DETECTED Final    Comment: CRITICAL RESULT CALLED TO, READ BACK BY AND VERIFIED WITH: PHARMD MARTIN, E. 1518 [id number] FCP    Klebsiella oxytoca NOT DETECTED NOT DETECTED Final   Klebsiella pneumoniae NOT DETECTED NOT DETECTED Final   Proteus species NOT DETECTED NOT DETECTED Final   Serratia marcescens NOT DETECTED NOT DETECTED Final   Carbapenem resistance NOT DETECTED NOT DETECTED Final   Haemophilus influenzae NOT DETECTED NOT DETECTED Final   Neisseria meningitidis NOT DETECTED NOT DETECTED Final   Pseudomonas aeruginosa NOT DETECTED NOT DETECTED Final   Candida albicans NOT DETECTED NOT DETECTED Final   Candida glabrata NOT DETECTED NOT DETECTED Final   Candida krusei NOT DETECTED NOT DETECTED Final   Candida parapsilosis NOT DETECTED NOT DETECTED Final   Candida tropicalis NOT DETECTED NOT DETECTED Final    Comment: Performed at [facility], [place]. [address], [place],  [address]  Culture, blood (routine x 2)     Status: None (Preliminary  result)   Collection Time: 10/26/18 10:41 AM   Specimen: BLOOD  Result Value Ref Range Status   Specimen Description BLOOD SITE NOT SPECIFIED  Final   Special Requests   Final    BOTTLES DRAWN  AEROBIC AND ANAEROBIC Blood Culture adequate volume   Culture   Final    NO GROWTH < 24 HOURS Performed at [facility], [place] [address] [place], [place] [address]    Report Status PENDING  Incomplete  Culture, blood (routine x 2)     Status: None (Preliminary result)   Collection Time: 10/26/18 10:50 AM   Specimen: BLOOD  Result Value Ref Range Status   Specimen Description BLOOD SITE NOT SPECIFIED  Final   Special Requests   Final    BOTTLES DRAWN AEROBIC AND ANAEROBIC Blood Culture adequate volume   Culture   Final    NO GROWTH < 24 HOURS Performed at [facility], [place] [address], [place], [place] [address]    Report Status PENDING  Incomplete     Studies: No results found.  Scheduled Meds: . apixaban  5 mg Oral BID  . atorvastatin  20 mg Oral QHS  . budesonide (PULMICORT) nebulizer solution  0.25 mg Nebulization BID  . dextromethorphan-guaiFENesin  1 tablet Oral BID  . fenofibrate  160 mg Oral QHS  . furosemide  40 mg Intravenous BID  . ipratropium-albuterol  3 mL Nebulization Q6H  . levothyroxine  175 mcg Oral QAC breakfast  . multivitamin with minerals  1 tablet Oral Daily  . propranolol ER  120 mg Oral Daily  . propranolol ER  60 mg Oral QHS   Continuous Infusions: . cefTRIAXone (ROCEPHIN)  IV 2 g (10/26/18 2142)    Principal Problem:   Acute pyelonephritis Active Problems:   Essential hypertension   Atrial fibrillation (HCC)   History of CVA (cerebrovascular accident)   Long term current use of anticoagulant therapy   Hyperlipidemia   Chronic diastolic heart failure (HCC)   COPD (chronic obstructive pulmonary disease) (HCC)   Marginal zone lymphoma (HCC)   GERD (gastroesophageal reflux disease)   Hypothyroidism   Sepsis ([place])   CKD (chronic kidney  disease), stage III ([place])    Time spent: 35 minutes     [place] [facility]  [facility] Pager [phone]. If 7PM-7AM, please contact night-coverage at [email or web address], password [facility] 10/27/2018, 3:07 PM  LOS: 3 days

## 2018-10-27 NOTE — Progress Notes (Signed)
Echocardiogram 2D Echocardiogram has been performed.  Kristin Hood 10/27/2018, 4:32 PM

## 2018-10-27 NOTE — Consult Note (Signed)
   [facility] Inpatient Consult   10/27/2018  Kristin Hood 1940-08-21 [id number]   Patient is no longer active with [facility] Care Management.  Hx with the [place] team. Notified of status change needed.  Patient will be followed by an external care management team for [employer], [place].  Will sign off.  Natividad Brood, RN BSN [facility] Liaison  [phone] business mobile phone Toll free office [phone]  Fax number: [phone] [email or web address]

## 2018-10-27 NOTE — Patient Outreach (Signed)
[facility]) Care Management  10/27/2018  Kristin Hood 1940-08-06 [id number]   Patient's case was not properly closed 05/17/2018 as she did not meet the out-of-pocket expense requirement for [place] at that time.    Patient's case will gladly be reopened if necessary.  [name], PharmD, [place] Clinical Pharmacist [phone]

## 2018-10-27 NOTE — Care Management Important Message (Signed)
Important Message  Patient Details  Name: Kristin Hood MRN: [id number] Date of Birth: November 22, 1940   Medicare Important Message Given:  Yes     Shelda Altes 10/27/2018, 2:14 PM

## 2018-10-27 NOTE — Progress Notes (Signed)
Physical Therapy Treatment Patient Details Name: RHAYA COALE MRN: [id number] DOB: 04/23/1940 Today's Date: 10/27/2018    History of Present Illness 78 yo female with onset of UTI and atelectasis with pleural effusion was admitted to hosp, referred to PT for mobility ck.  Pt has PMHx:  acute respiratory failure, COPD, stroke with TPA, obesity, HOH, essential tremor, hypoxemia, angina, chemotherapy, CKD 3, breast CA, CHF, a-fib,     PT Comments    Pt showed and reported improvement.  She still had SOB, but sats were solidly in the 90's on RA.  For shorter distance, pt was steady, but as she fatigued, she began to mildly stagger, needing to touch the rail and causing need for minor stability assist     Follow Up Recommendations  Home health PT;Supervision - Intermittent     Equipment Recommendations  None recommended by PT    Recommendations for Other Services       Precautions / Restrictions Precautions Precautions: Fall Precaution Comments: HOH    Mobility  Bed Mobility Overal bed mobility: Needs Assistance             General bed mobility comments: OOB on arrival  Transfers Overall transfer level: Needs assistance   Transfers: Sit to/from Stand Sit to Stand: Supervision         General transfer comment: no assist needed, use of armrests  Ambulation/Gait Ambulation/Gait assistance: Min guard;Min assist Gait Distance (Feet): 200 Feet Assistive device: None Gait Pattern/deviations: Step-through pattern Gait velocity: slower   General Gait Details: generally steady until fatigue and then with mild deviation needing minor stability assist.  No AD needed.   Stairs             Wheelchair Mobility    Modified Rankin (Stroke Patients Only)       Balance Overall balance assessment: History of Falls                                          Cognition Arousal/Alertness: Awake/alert Behavior During Therapy: WFL for tasks  assessed/performed Overall Cognitive Status: Within Functional Limits for tasks assessed                                        Exercises      General Comments General comments (skin integrity, edema, etc.): During gait 96% on RA and EHR 89 bpm      Pertinent Vitals/Pain Pain Assessment: No/denies pain    Home Living                      Prior Function            PT Goals (current goals can now be found in the care plan section) Acute Rehab PT Goals Patient Stated Goal: not mentioned PT Goal Formulation: With patient Time For Goal Achievement: 11/08/18 Potential to Achieve Goals: Good Progress towards PT goals: Progressing toward goals    Frequency    Min 3X/week      PT Plan Current plan remains appropriate    Co-evaluation              AM-PAC PT "6 Clicks" Mobility   Outcome Measure  Help needed turning from your back to your side while in a flat bed without using bedrails?:  A Little Help needed moving from lying on your back to sitting on the side of a flat bed without using bedrails?: A Little Help needed moving to and from a bed to a chair (including a wheelchair)?: A Little Help needed standing up from a chair using your arms (e.g., wheelchair or bedside chair)?: A Little Help needed to walk in hospital room?: A Little Help needed climbing 3-5 steps with a railing? : A Lot 6 Click Score: 17    End of Session   Activity Tolerance: Patient tolerated treatment well Patient left: in bed;with call bell/phone within reach;with bed alarm set Nurse Communication: Mobility status PT Visit Diagnosis: Unsteadiness on feet (R26.81);Muscle weakness (generalized) (M62.81)     Time: 3953-2023 PT Time Calculation (min) (ACUTE ONLY): 19 min  Charges:  $Gait Training: 8-22 mins                     10/27/2018  Donnella Sham, [place] [phone]  (pager) [phone]  (office)   Tessie Fass  Anatalia Kronk 10/27/2018, 4:44 PM

## 2018-10-28 ENCOUNTER — Other Ambulatory Visit (HOSPITAL_COMMUNITY): Payer: PPO

## 2018-10-28 DIAGNOSIS — E890 Postprocedural hypothyroidism: Secondary | ICD-10-CM

## 2018-10-28 DIAGNOSIS — I472 Ventricular tachycardia: Secondary | ICD-10-CM

## 2018-10-28 DIAGNOSIS — J441 Chronic obstructive pulmonary disease with (acute) exacerbation: Secondary | ICD-10-CM

## 2018-10-28 DIAGNOSIS — I5033 Acute on chronic diastolic (congestive) heart failure: Secondary | ICD-10-CM

## 2018-10-28 DIAGNOSIS — A4151 Sepsis due to Escherichia coli [E. coli]: Principal | ICD-10-CM

## 2018-10-28 LAB — BASIC METABOLIC PANEL
Anion gap: 10 (ref 5–15)
BUN: 30 mg/dL — ABNORMAL HIGH (ref 8–23)
CO2: 26 mmol/L (ref 22–32)
Calcium: 9 mg/dL (ref 8.9–10.3)
Chloride: 98 mmol/L (ref 98–111)
Creatinine, Ser: 1.05 mg/dL — ABNORMAL HIGH (ref 0.44–1.00)
GFR calc Af Amer: 58 mL/min — ABNORMAL LOW (ref >60)
GFR calc non Af Amer: 50 mL/min — ABNORMAL LOW (ref >60)
Glucose, Bld: 160 mg/dL — ABNORMAL HIGH (ref 70–99)
Potassium: 4.5 mmol/L (ref 3.5–5.1)
Sodium: 134 mmol/L — ABNORMAL LOW (ref 135–145)

## 2018-10-28 LAB — HEMOGLOBIN AND HEMATOCRIT, BLOOD
HCT: 34.4 % — ABNORMAL LOW (ref 36.0–46.0)
Hemoglobin: 10.8 g/dL — ABNORMAL LOW (ref 12.0–15.0)

## 2018-10-28 LAB — MAGNESIUM: Magnesium: 2.1 mg/dL (ref 1.7–2.4)

## 2018-10-28 LAB — PHOSPHORUS: Phosphorus: 2.6 mg/dL (ref 2.5–4.6)

## 2018-10-28 LAB — TSH: TSH: 1.827 u[IU]/mL (ref 0.350–4.500)

## 2018-10-28 MED ORDER — PREDNISONE 20 MG PO TABS: 40.0000 mg | ORAL_TABLET | Freq: Every day | ORAL | 0 refills | Status: AC

## 2018-10-28 MED ORDER — CEPHALEXIN 500 MG PO CAPS: 500.0000 mg | ORAL_CAPSULE | Freq: Three times a day (TID) | ORAL | 0 refills | Status: DC

## 2018-10-28 MED ORDER — CEPHALEXIN 500 MG PO CAPS: 500.0000 mg | ORAL_CAPSULE | Freq: Three times a day (TID) | ORAL | Status: DC

## 2018-10-28 MED ORDER — CEPHALEXIN 500 MG PO CAPS: 500.0000 mg | ORAL_CAPSULE | Freq: Three times a day (TID) | ORAL | 0 refills | Status: AC

## 2018-10-28 MED ORDER — BUDESONIDE 0.25 MG/2ML IN SUSP: 0.2500 mg | Freq: Two times a day (BID) | RESPIRATORY_TRACT | 12 refills | Status: DC

## 2018-10-28 MED ORDER — IPRATROPIUM-ALBUTEROL 0.5-2.5 (3) MG/3ML IN SOLN
3.0000 mL | Freq: Three times a day (TID) | RESPIRATORY_TRACT | Status: DC
  Administered 2018-10-28: 08:00:00 3 mL via RESPIRATORY_TRACT
  Filled 2018-10-28 (×3): qty 3

## 2018-10-28 NOTE — Progress Notes (Signed)
Pt ambulated 422ft with NA.  Lowest pulse ox reading during walk was 94. Pt tolerated it very well.  MD has been notified.

## 2018-10-28 NOTE — TOC Transition Note (Addendum)
Transition of Care [facility]) - CM/SW Discharge Note   Patient Details  Name: Kristin Hood MRN: [id number] Date of Birth: 08/07/1940  [facility] CM/SW Contact:  Carles Collet, RN Phone Number: 10/28/2018, 11:23 AM   Clinical Narrative:    Damaris Schooner w patient at bedside. She states she lives at home w her daughter and SIL. She has DME RW rollator cane and toilet seat rails. No DME needs. She would like [facility]. Discussed Medicare and left in room. She would like to start at the top with highest ratings and go down. [employer] declined. [facility] pending.    Alvis Lemmings accepted referral, requested MD to place [facility] PT order.     Final next level of care: [place] Barriers to Discharge: No Barriers Identified   Patient Goals and CMS Choice Patient states their goals for this hospitalization and ongoing recovery are:: to return home CMS Medicare.gov Compare Post Acute Care list provided to:: Patient Choice offered to / list presented to : Patient  Discharge Placement                       Discharge Plan and Services                DME Arranged: N/A           HH Agency: [place] Date [place]: 10/28/18 Time [place]: 8675 Representative spoke with at [place]: [place] ([place]) Interventions     Readmission Risk Interventions No flowsheet data found.

## 2018-10-28 NOTE — Progress Notes (Signed)
CCMD called to notify Pt had 10 beats short V-tach. She was walking to the bathroom asymptomatically. Her EKG was mostly Atrial fib with rate under controlled. BP remained stable. No SOB. No acute distress noted.  Will continue to monitor.  Andre Gallego, R.

## 2018-10-28 NOTE — Discharge Summary (Addendum)
Physician Discharge Summary  Kristin Hood WUG:[id number] DOB: [birth year] DOA: 10/23/2018  PCP: [name], MD  Admit date: 10/23/2018 Discharge date: 10/28/2018 Consultations: None Admitted From: home Disposition: home   Discharge Diagnoses:  Principal Problem:   Acute pyelonephritis Active Problems:   Essential hypertension   Atrial fibrillation ([name])   History of CVA (cerebrovascular accident)   Long term current use of anticoagulant therapy   Hyperlipidemia   Chronic diastolic heart failure (HCC)   COPD (chronic obstructive pulmonary disease) (HCC)   Marginal zone lymphoma (HCC)   GERD (gastroesophageal reflux disease)   Hypothyroidism   Sepsis ([name])   CKD (chronic kidney disease), stage III [name])   Hospital Course Summary: 78 y.o.femalewith medical history significant ofhypertension, hyperlipidemia, COPD, stroke, GERD, hypothyroidism, atrial fibrillation on Eliquis,dCHF, breast cancer (left mastectomy), marginal zone lymphoma under observation, CKD stage III, obesity presented to ED with fever, chills and bilateral flank pain. ED Course:pt was found to have positive urinalysis (cloudy appearance, many bacteria, moderate amount of leukocyte, WBC>50),negative COVID-19 test,WBC 7.4, lactic acid 1.6, stable renal function, temperature one 1.7, blood pressure 106/78, tachycardia, tachypnea, oxygen saturation 94% on room air.Patient admitted to telemetry bed with empiric IV antibiotics (Ceftriaxone) for pyelonephritis under Hospitalist's service.  CT abd/pelvis on 7/26 showed "new asymmetric left perinephric soft tissue stranding, suspicious for pyelonephritis without evidence of urolithiasis or hydronephrosis.Stable abdominal and inferior mediastinal lymphadenopathy. No evidence of new or progressive disease.Stable splenomegaly". Patient responded to antibiotics course well although her hospital course was complicated by dyspnea related to volume overload requiring IV  diuretics on 7/29 and brief episode of NSVT (10 beat) on 7/30 while walking to the bathroom.   1. E coli sepsis due to acute pyelonephritis:Patient met sepsis criterion given UTI/bacteremia with associated fever, tachycardia and tachypnea. Lactic acid is normal. Urine cultures and Blood culture (7/26) grew E. coli. Repeat Blood cx from 7/28 are so far negative. Patient is afebrile and her SBP has remained stable around 110-120 during the hospital course. Patient will be transitioned oral keflex to complete total 14 day course.  2. Dyspnea on exertion/acute diastolic CHF: Patient complained of dyspnea on 7/29 which was related to volume overload and patient responded to IV Lasix x2 doses.  She was also dyspneic with pulse ox at 90% walked with PT yesterday.  Today she feels much better and back to her baseline.  She states she has a pulse oximetry at home and occasionally does drop to 88% on exertion.  Today she was able to walk in the hallway without feeling tachypneic and her O2 saturation remained above 94%.  She feels good to go home.  Of note, chest x-ray/CT chest on admission only showed bibasilar atelectasis and small pleural effusion.She does have chronic leg edema at baseline and takes Lasix 40 mg p.o. which can be resumed upon discharge.  Echocardiogram in this admission showed preserved EF.  3.  NSVT: Patient states she has history of palpitations and intermittent chest pains that is known to her primary cardiologist.  She takes propranolol for essential tremors but also takes extra dose of propranolol whenever she has chest symptoms.  She did have a 10 beat NSVT this morning while walking to the bathroom but was asymptomatic.  Patient advised to follow-up with her primary cardiologist [name] for further advice and possibly a Holter monitor to assess frequency and need for antiarrhythmics.  BMP was obtained prior to discharge which shows potassium at 4, magnesium greater than 2 and phosphorus  within  normal limits.  4.  Hypertension: Patient's blood pressure has remained on the low side in the setting of problem #1 during the hospital course.  She is on losartan 100 mg at baseline which was not resumed during this hospitalization and she was only getting 180 mg propranolol here (as mentioned above she does take extra 60 mg at home as needed).  Patient also reports intentional weight loss of 40 pounds over the last 2 years due to which she believes her blood pressure has gotten better.  She is advised to hold losartan for now and follow-up with PCP/primary cardiology for further advice.  5.  Chronic atrial fibrillation: Resume beta-blockers and anticoagulation.  Status post pacemaker  6.  Hypothyroidism: Patient reports history of thyroid cancer for which she received radiation treatment and since then has had dysphonia and some exertional dyspnea.  Upon her request, TSH was obtained which appears to be elevated at 12.  Patient was advised regarding unreliable TSH results in the setting of acute illness but she stated she would rather know the result and have her primary endocrinology APP (with [name]) follow-up given limited office visits with COVID.  She will follow-up regarding Synthroid dose adjustment accordingly next week with endocrinology office.  She may resume 175 mcg that she takes at home for now.  7. 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 [name]. Splenomegaly on CT. Follow-up with hematologist  8.  COPD with possible exacerbation: Given complaints of exertional dyspnea yesterday, patient was also started on prednisone 60 mg.  Will reduce to 40 mg to complete course x3 days.  Resume home neb treatments/MDI.  9. H/O CVA: resume home meds, anticoagulation/statins  10. Essential tremors: Resume propranolol       Discharge Exam:  Vitals:   10/28/18 1100 10/28/18 1545  BP:    Pulse: 77 78  Resp: 19 17  Temp:    SpO2:  99% 94%   Vitals:   10/28/18 0807 10/28/18 0900 10/28/18 1100 10/28/18 1545  BP:      Pulse:  74 77 78  Resp:  (!) '26 19 17  ' Temp:      TempSrc:      SpO2: 100% 97% 99% 94%  Weight:      Height:        General: Pt is alert, awake, not in acute distress Cardiovascular: RRR, S1/S2 +, no rubs, no gallops Respiratory: CTA bilaterally, no wheezing, no rhonchi Abdominal: Soft, NT, ND, bowel sounds + Extremities: Mild chronic edema with stasis dermatitis Discharge Condition:Stable CODE STATUS: DO NOT INTUBATE Diet recommendation: Heart healthy diet Recommendations for Outpatient Follow-up:  1. Follow up with PCP: In 1 week 2. Follow up with consultants: [facility] endocrinology next week, primary cardiologist Dr. Gillian Shields in 10 days 3. Please obtain follow up labs including: TSH, T3/T4  Home Health services upon discharge: Home health PT has been set up Equipment/Devices upon discharge: Did not qualify for home O2   Discharge Instructions:  Discharge Instructions    (HEART FAILURE PATIENTS) Call MD:  Anytime you have any of the following symptoms: 1) 3 pound weight gain in 24 hours or 5 pounds in 1 week 2) shortness of breath, with or without a dry hacking cough 3) swelling in the hands, feet or stomach 4) if you have to sleep on extra pillows at night in order to breathe.   Complete by: As directed    Call MD for:  difficulty breathing, headache or visual disturbances  Complete by: As directed    Call MD for:  persistant dizziness or light-headedness   Complete by: As directed    Call MD for:  persistant nausea and vomiting   Complete by: As directed    Call MD for:  temperature >100.4   Complete by: As directed    Diet - low sodium heart healthy   Complete by: As directed    Increase activity slowly   Complete by: As directed      Allergies as of 10/28/2018      Reactions   Floxin [ofloxacin] Nausea Only, Other (See Comments)   Dizziness, Vertigo      Medication List     STOP taking these medications   losartan 100 MG tablet Commonly known as: COZAAR   telmisartan 80 MG tablet Commonly known as: MICARDIS     TAKE these medications   albuterol 108 (90 Base) MCG/ACT inhaler Commonly known as: VENTOLIN HFA Inhale 2 puffs into the lungs every 4 (four) hours as needed for wheezing or shortness of breath.   Artificial Tears 1.4 % ophthalmic solution Generic drug: polyvinyl alcohol Place 1 drop into both eyes daily as needed for dry eyes.   atorvastatin 20 MG tablet Commonly known as: LIPITOR Take 20 mg by mouth at bedtime.   budesonide 0.25 MG/2ML nebulizer solution Commonly known as: PULMICORT Take 2 mLs (0.25 mg total) by nebulization 2 (two) times daily.   cephALEXin 500 MG capsule Commonly known as: KEFLEX Take 1 capsule (500 mg total) by mouth every 8 (eight) hours for 7 days.   Eliquis 5 MG Tabs tablet Generic drug: apixaban TAKE 1 TABLET BY MOUTH TWICE A DAY What changed: how much to take   fenofibrate 160 MG tablet Take 160 mg by mouth at bedtime.   fluticasone 50 MCG/ACT nasal spray Commonly known as: FLONASE Place 2 sprays into both nostrils daily as needed for allergies or rhinitis.   furosemide 40 MG tablet Commonly known as: LASIX Take 1 tablet (40 mg total) by mouth daily.   guaiFENesin 600 MG 12 hr tablet Commonly known as: MUCINEX Take 600 mg by mouth daily.   levothyroxine 175 MCG tablet Commonly known as: SYNTHROID TAKE 1 TABLET BY MOUTH EVERY DAY BEFORE BREAKFAST What changed:   how much to take  how to take this  when to take this  additional instructions   loratadine 10 MG tablet Commonly known as: CLARITIN Take 10 mg by mouth daily as needed for allergies (rash).   multivitamin with minerals Tabs tablet Take 1 tablet by mouth daily.   potassium chloride 10 MEQ tablet Commonly known as: K-DUR Take 1 tablet (10 mEq total) by mouth daily. What changed:   when to take this  reasons to take  this   predniSONE 20 MG tablet Commonly known as: DELTASONE Take 2 tablets (40 mg total) by mouth daily for 3 days. Start taking on: October 29, 2018   propranolol ER 60 MG 24 hr capsule Commonly known as: INDERAL LA TAKE 2 CAPS IN THE MORNING TAKE 1 CAP IN THE AFTERNOON, 1 CAP AT NIGHT What changed:   how much to take  how to take this  when to take this  additional instructions   Tiotropium Bromide Monohydrate 2.5 MCG/ACT Aers Commonly known as: Spiriva Respimat Inhale 2 puffs into the lungs daily.      Follow-up Information    [facility] Follow up.   Specialty: Home Health Services Why: For home  health services. They will contact you in 1-2 days to set up home health services.  Contact information: [place] [address] [phone]          Allergies  Allergen Reactions  . Floxin [Ofloxacin] Nausea Only and Other (See Comments)    Dizziness, Vertigo       The results of significant diagnostics from this hospitalization (including imaging, microbiology, ancillary and laboratory) are listed below for reference.    Labs: BNP (last 3 results) Recent Labs    05/09/18 1543 10/24/18 0354  BNP 548.5* 517.6*   Basic Metabolic Panel: Recent Labs  Lab 10/23/18 2200 10/24/18 0354 10/25/18 0335 10/28/18 1407  NA 133* 132* 134* 134*  K 4.3 3.5 3.7 4.5  CL 98 99 101 98  CO2 24 24 21* 26  GLUCOSE 142* 118* 98 160*  BUN 41* 38* 29* 30*  CREATININE 1.57* 1.41* 1.16* 1.05*  CALCIUM 9.2 8.7* 8.6* 9.0  MG  --   --   --  2.1  PHOS  --   --   --  2.6   Liver Function Tests: Recent Labs  Lab 10/23/18 2200 10/25/18 0335  AST 40 41  ALT 17 21  ALKPHOS 37* 32*  BILITOT 1.6* 1.5*  PROT 8.6* 7.3  ALBUMIN 3.3* 2.7*   No results for input(s): LIPASE, AMYLASE in the last 168 hours. No results for input(s): AMMONIA in the last 168 hours. CBC: Recent Labs  Lab 10/23/18 2200 10/24/18 0354 10/25/18 0335 10/28/18 1407   WBC 7.4 6.3 5.4  --   NEUTROABS 6.5  --  4.3  --   HGB 11.5* 10.7* 9.8* 10.8*  HCT 35.8* 34.7* 30.7* 34.4*  MCV 91.1 92.5 91.1  --   PLT 279 239 256  --    Cardiac Enzymes: No results for input(s): CKTOTAL, CKMB, CKMBINDEX, TROPONINI in the last 168 hours. BNP: Invalid input(s): POCBNP CBG: No results for input(s): GLUCAP in the last 168 hours. D-Dimer No results for input(s): DDIMER in the last 72 hours. Hgb A1c No results for input(s): HGBA1C in the last 72 hours. Lipid Profile No results for input(s): CHOL, HDL, LDLCALC, TRIG, CHOLHDL, LDLDIRECT in the last 72 hours. Thyroid function studies No results for input(s): TSH, T4TOTAL, T3FREE, THYROIDAB in the last 72 hours.  Invalid input(s): FREET3 Anemia work up No results for input(s): VITAMINB12, FOLATE, FERRITIN, TIBC, IRON, RETICCTPCT in the last 72 hours. Urinalysis    Component Value Date/Time   COLORURINE AMBER (A) 10/23/2018 2218   APPEARANCEUR CLOUDY (A) 10/23/2018 2218   LABSPEC 1.016 10/23/2018 2218   PHURINE 5.0 10/23/2018 2218   GLUCOSEU NEGATIVE 10/23/2018 2218   HGBUR LARGE (A) 10/23/2018 2218   BILIRUBINUR NEGATIVE 10/23/2018 2218   [place] 10/23/2018 2218   PROTEINUR 100 (A) 10/23/2018 2218   UROBILINOGEN 1.0 10/04/2014 1457   NITRITE NEGATIVE 10/23/2018 2218   LEUKOCYTESUR MODERATE (A) 10/23/2018 2218   Sepsis Labs Invalid input(s): PROCALCITONIN,  WBC,  LACTICIDVEN Microbiology Recent Results (from the past 240 hour(s))  Urine culture     Status: Abnormal   Collection Time: 10/23/18  9:38 PM   Specimen: Urine, Random  Result Value Ref Range Status   Specimen Description URINE, RANDOM  Final   Special Requests   Final    NONE Performed at [facility], [place] [address] [place], [place] [address]    Culture >=100,000 COLONIES/mL ESCHERICHIA COLI (A)  Final   Report Status 10/26/2018 FINAL  Final   Organism ID, Bacteria ESCHERICHIA COLI (A)  Final      Susceptibility    Escherichia coli - MIC*    AMPICILLIN 4 SENSITIVE Sensitive     CEFAZOLIN <=4 SENSITIVE Sensitive     CEFTRIAXONE <=1 SENSITIVE Sensitive     CIPROFLOXACIN <=0.25 SENSITIVE Sensitive     GENTAMICIN <=1 SENSITIVE Sensitive     IMIPENEM <=0.25 SENSITIVE Sensitive     NITROFURANTOIN <=16 SENSITIVE Sensitive     TRIMETH/SULFA <=20 SENSITIVE Sensitive     AMPICILLIN/SULBACTAM <=2 SENSITIVE Sensitive     PIP/TAZO <=4 SENSITIVE Sensitive     Extended ESBL NEGATIVE Sensitive     * >=100,000 COLONIES/mL ESCHERICHIA COLI  Culture, blood (Routine x 2)     Status: Abnormal   Collection Time: 10/23/18 11:40 PM   Specimen: BLOOD RIGHT HAND  Result Value Ref Range Status   Specimen Description BLOOD RIGHT HAND  Final   Special Requests   Final    BOTTLES DRAWN AEROBIC AND ANAEROBIC Blood Culture adequate volume   Culture  Setup Time GRAM NEGATIVE RODS ANAEROBIC BOTTLE ONLY   Final   Culture (A)  Final    ESCHERICHIA COLI SUSCEPTIBILITIES PERFORMED ON PREVIOUS CULTURE WITHIN THE LAST 5 DAYS. Performed at [facility], [place] [address] [place], [place] [address]    Report Status 10/26/2018 FINAL  Final  SARS Coronavirus 2 (CEPHEID - Performed in [place] hospital lab), Hosp Order     Status: None   Collection Time: 10/23/18 11:45 PM   Specimen: Nasopharyngeal Swab  Result Value Ref Range Status   SARS Coronavirus 2 NEGATIVE NEGATIVE Final    Comment: (NOTE) If result is NEGATIVE SARS-CoV-2 target nucleic acids are NOT DETECTED. The SARS-CoV-2 RNA is generally detectable in upper and lower  respiratory specimens during the acute phase of infection. The lowest  concentration of SARS-CoV-2 viral copies this assay can detect is 250  copies / mL. A negative result does not preclude SARS-CoV-2 infection  and should not be used as the sole basis for treatment or other  patient management decisions.  A negative result may occur with  improper specimen collection / handling, submission of  specimen other  than nasopharyngeal swab, presence of viral mutation(s) within the  areas targeted by this assay, and inadequate number of viral copies  (<250 copies / mL). A negative result must be combined with clinical  observations, patient history, and epidemiological information. If result is POSITIVE SARS-CoV-2 target nucleic acids are DETECTED. The SARS-CoV-2 RNA is generally detectable in upper and lower  respiratory specimens dur ing the acute phase of infection.  Positive  results are indicative of active infection with SARS-CoV-2.  Clinical  correlation with patient history and other diagnostic information is  necessary to determine patient infection status.  Positive results do  not rule out bacterial infection or co-infection with other viruses. If result is PRESUMPTIVE POSTIVE SARS-CoV-2 nucleic acids MAY BE PRESENT.   A presumptive positive result was obtained on the submitted specimen  and confirmed on repeat testing.  While 2019 novel coronavirus  (SARS-CoV-2) nucleic acids may be present in the submitted sample  additional confirmatory testing may be necessary for epidemiological  and / or clinical management purposes  to differentiate between  SARS-CoV-2 and other Sarbecovirus currently known to infect humans.  If clinically indicated additional testing with an alternate test  methodology [phone]) is advised. The SARS-CoV-2 RNA is generally  detectable in upper and lower respiratory sp  ecimens during the acute  phase of infection. The expected result is Negative. Fact Sheet for Patients:  StrictlyIdeas.no Fact Sheet for Healthcare Providers: BankingDealers.co.za This test is not yet approved or cleared by the [place] FDA and has been authorized for detection and/or diagnosis of SARS-CoV-2 by FDA under an Emergency Use Authorization (EUA).  This EUA will remain in effect (meaning this test can be used) for the  duration of the COVID-19 declaration under Section 564(b)(1) of the Act, 21 U.S.C. section 360bbb-3(b)(1), unless the authorization is terminated or revoked sooner. Performed at [facility], [place] [address] [place],  [address]   Culture, blood (Routine x 2)     Status: Abnormal   Collection Time: 10/24/18 12:05 AM   Specimen: BLOOD LEFT HAND  Result Value Ref Range Status   Specimen Description BLOOD LEFT HAND  Final   Special Requests   Final    BOTTLES DRAWN AEROBIC AND ANAEROBIC Blood Culture adequate volume   Culture  Setup Time   Final    GRAM NEGATIVE RODS IN BOTH AEROBIC AND ANAEROBIC BOTTLES CRITICAL RESULT CALLED TO, READ BACK BY AND VERIFIED WITH: Cathlyn Parsons [id number] FCP Performed at [facility], [place] [address], [place], [place] [address]    Culture ESCHERICHIA COLI (A)  Final   Report Status 10/26/2018 FINAL  Final   Organism ID, Bacteria ESCHERICHIA COLI  Final      Susceptibility   Escherichia coli - MIC*    AMPICILLIN 8 SENSITIVE Sensitive     CEFAZOLIN <=4 SENSITIVE Sensitive     CEFEPIME <=1 SENSITIVE Sensitive     CEFTAZIDIME <=1 SENSITIVE Sensitive     CEFTRIAXONE <=1 SENSITIVE Sensitive     CIPROFLOXACIN <=0.25 SENSITIVE Sensitive     GENTAMICIN <=1 SENSITIVE Sensitive     IMIPENEM <=0.25 SENSITIVE Sensitive     TRIMETH/SULFA <=20 SENSITIVE Sensitive     AMPICILLIN/SULBACTAM 4 SENSITIVE Sensitive     PIP/TAZO <=4 SENSITIVE Sensitive     Extended ESBL NEGATIVE Sensitive     * ESCHERICHIA COLI  Blood Culture ID Panel (Reflexed)     Status: Abnormal   Collection Time: 10/24/18 12:05 AM  Result Value Ref Range Status   Enterococcus species NOT DETECTED NOT DETECTED Final   Listeria monocytogenes NOT DETECTED NOT DETECTED Final   Staphylococcus species NOT DETECTED NOT DETECTED Final   Staphylococcus aureus (BCID) NOT DETECTED NOT DETECTED Final   Streptococcus species NOT DETECTED NOT DETECTED Final   Streptococcus agalactiae  NOT DETECTED NOT DETECTED Final   Streptococcus pneumoniae NOT DETECTED NOT DETECTED Final   Streptococcus pyogenes NOT DETECTED NOT DETECTED Final   Acinetobacter baumannii NOT DETECTED NOT DETECTED Final   Enterobacteriaceae species DETECTED (A) NOT DETECTED Final    Comment: Enterobacteriaceae represent a large family of gram-negative bacteria, not a single organism. CRITICAL RESULT CALLED TO, READ BACK BY AND VERIFIED WITH: [place], E. [id number] FCP    Enterobacter cloacae complex NOT DETECTED NOT DETECTED Final   Escherichia coli DETECTED (A) NOT DETECTED Final    Comment: CRITICAL RESULT CALLED TO, READ BACK BY AND VERIFIED WITH: PHARMD MARTIN, E. [id number] FCP    Klebsiella oxytoca NOT DETECTED NOT DETECTED Final   Klebsiella pneumoniae NOT DETECTED NOT DETECTED Final   Proteus species NOT DETECTED NOT DETECTED Final   Serratia marcescens NOT DETECTED NOT DETECTED Final   Carbapenem resistance NOT DETECTED NOT DETECTED Final   Haemophilus influenzae NOT DETECTED NOT  DETECTED Final   Neisseria meningitidis NOT DETECTED NOT DETECTED Final   Pseudomonas aeruginosa NOT DETECTED NOT DETECTED Final   Candida albicans NOT DETECTED NOT DETECTED Final   Candida glabrata NOT DETECTED NOT DETECTED Final   Candida krusei NOT DETECTED NOT DETECTED Final   Candida parapsilosis NOT DETECTED NOT DETECTED Final   Candida tropicalis NOT DETECTED NOT DETECTED Final    Comment: Performed at [facility], [place] [address], [place], [place] [address]  Culture, blood (routine x 2)     Status: None (Preliminary result)   Collection Time: 10/26/18 10:41 AM   Specimen: BLOOD  Result Value Ref Range Status   Specimen Description BLOOD SITE NOT SPECIFIED  Final   Special Requests   Final    BOTTLES DRAWN AEROBIC AND ANAEROBIC Blood Culture adequate volume   Culture   Final    NO GROWTH 2 DAYS Performed at [facility], [place] [address], [place], [place] [address]    Report Status  PENDING  Incomplete  Culture, blood (routine x 2)     Status: None (Preliminary result)   Collection Time: 10/26/18 10:50 AM   Specimen: BLOOD  Result Value Ref Range Status   Specimen Description BLOOD SITE NOT SPECIFIED  Final   Special Requests   Final    BOTTLES DRAWN AEROBIC AND ANAEROBIC Blood Culture adequate volume   Culture   Final    NO GROWTH 2 DAYS Performed at [facility], [address] [place],  [address]    Report Status PENDING  Incomplete    Procedures/Studies: Ct Abdomen Pelvis Wo Contrast  Result Date: 10/24/2018 CLINICAL DATA:  Bilateral flank pain and hematuria. Recurrent urinary tract infections. Low-grade follicular lymphoma. EXAM: CT ABDOMEN AND PELVIS WITHOUT CONTRAST TECHNIQUE: Multidetector CT imaging of the abdomen and pelvis was performed following the standard protocol without IV contrast. COMPARISON:  08/19/2018 FINDINGS: Lower chest: Stable small right pleural effusion. Stable lymphadenopathy in inferior mediastinum in the right paracentral region measuring 2.8 cm on image 7/3. Hepatobiliary: No mass visualized on this unenhanced exam. Calcified gallstones are again seen, however there is no evidence of cholecystitis or biliary ductal dilatation. Pancreas: No mass or inflammatory process visualized on this unenhanced exam. Spleen: Stable splenomegaly measuring approximately 17 cm in length. Adrenals/Urinary tract: No evidence of urolithiasis or hydronephrosis. New asymmetric left perinephric soft tissue stranding is seen, suspicious for pyelonephritis. Bladder not well visualized due to beam hardening artifact from left hip prosthesis. Stomach/Bowel: No evidence of obstruction, inflammatory process, or abnormal fluid collections. Diverticulosis is seen mainly involving the descending and sigmoid colon, however there is no evidence of diverticulitis. Vascular/Lymphatic: Stable sub-cm lymph nodes in gastrohepatic ligament. Abdominal retroperitoneal  lymphadenopathy throughout the left paraaortic and aortocaval spaces also shows no significant change. Index area in the left paraaortic region measures 2.9 cm in short axis on image 33/3, compared to 3.0 cm previously. No new or increased sites of lymphadenopathy are identified. Aortic atherosclerosis. No evidence of abdominal aortic aneurysm. Reproductive: Inferior pelvis is obscured by significant beam hardening artifact from left hip prosthesis. Other:  None. Musculoskeletal:  No suspicious bone lesions identified. IMPRESSION: 1. New asymmetric left perinephric soft tissue stranding, suspicious for pyelonephritis. No evidence of urolithiasis or hydronephrosis. 2. Stable abdominal and inferior mediastinal lymphadenopathy. No evidence of new or progressive disease. 3. Stable splenomegaly. 4. Cholelithiasis. No radiographic evidence of cholecystitis. 5. Colonic diverticulosis. No radiographic evidence of diverticulitis. 6. Stable small right pleural effusion. Aortic Atherosclerosis (  ICD10-I70.0). Electronically Signed   By: Marlaine Hind M.D.   On: 10/24/2018 13:00   Dg Chest 2 View  Result Date: 10/24/2018 CLINICAL DATA:  Shortness of breath fever EXAM: CHEST - 2 VIEW COMPARISON:  May 09, 2018 FINDINGS: Heart size is enlarged. Mitral valve calcifications are again noted. There are patchy bibasilar airspace opacities favored to represent atelectasis or scarring. There is likely a trace right-sided pleural effusion. There is no acute osseous abnormality. IMPRESSION: 1. Cardiomegaly. 2. Bibasilar airspace opacities similar to prior study, favored to represent atelectasis or scarring. 3. Persistent trace right-sided pleural effusion. Electronically Signed   By: Constance Holster M.D.   On: 10/24/2018 00:56     Time coordinating discharge: Over 30 minutes  SIGNED:   Guilford Shi, MD  [facility] 10/28/2018, 4:14 PM Pager : [phone]

## 2018-10-31 LAB — CULTURE, BLOOD (ROUTINE X 2)
Culture: NO GROWTH
Culture: NO GROWTH
Special Requests: ADEQUATE
Special Requests: ADEQUATE

## 2018-11-02 DIAGNOSIS — I872 Venous insufficiency (chronic) (peripheral): Secondary | ICD-10-CM | POA: Diagnosis not present

## 2018-11-02 DIAGNOSIS — K5730 Diverticulosis of large intestine without perforation or abscess without bleeding: Secondary | ICD-10-CM | POA: Diagnosis not present

## 2018-11-02 DIAGNOSIS — M19032 Primary osteoarthritis, left wrist: Secondary | ICD-10-CM | POA: Diagnosis not present

## 2018-11-02 DIAGNOSIS — C8580 Other specified types of non-Hodgkin lymphoma, unspecified site: Secondary | ICD-10-CM | POA: Diagnosis not present

## 2018-11-02 DIAGNOSIS — I700 Atherosclerosis of aorta: Secondary | ICD-10-CM | POA: Diagnosis not present

## 2018-11-02 DIAGNOSIS — N10 Acute tubulo-interstitial nephritis: Secondary | ICD-10-CM | POA: Diagnosis not present

## 2018-11-02 DIAGNOSIS — I0981 Rheumatic heart failure: Secondary | ICD-10-CM | POA: Diagnosis not present

## 2018-11-02 DIAGNOSIS — M48061 Spinal stenosis, lumbar region without neurogenic claudication: Secondary | ICD-10-CM | POA: Diagnosis not present

## 2018-11-02 DIAGNOSIS — I5033 Acute on chronic diastolic (congestive) heart failure: Secondary | ICD-10-CM | POA: Diagnosis not present

## 2018-11-02 DIAGNOSIS — G252 Other specified forms of tremor: Secondary | ICD-10-CM | POA: Diagnosis not present

## 2018-11-02 DIAGNOSIS — I083 Combined rheumatic disorders of mitral, aortic and tricuspid valves: Secondary | ICD-10-CM | POA: Diagnosis not present

## 2018-11-02 DIAGNOSIS — I130 Hypertensive heart and chronic kidney disease with heart failure and stage 1 through stage 4 chronic kidney disease, or unspecified chronic kidney disease: Secondary | ICD-10-CM | POA: Diagnosis not present

## 2018-11-02 DIAGNOSIS — A4151 Sepsis due to Escherichia coli [E. coli]: Secondary | ICD-10-CM | POA: Diagnosis not present

## 2018-11-02 DIAGNOSIS — K8020 Calculus of gallbladder without cholecystitis without obstruction: Secondary | ICD-10-CM | POA: Diagnosis not present

## 2018-11-02 DIAGNOSIS — I482 Chronic atrial fibrillation, unspecified: Secondary | ICD-10-CM | POA: Diagnosis not present

## 2018-11-02 DIAGNOSIS — J9601 Acute respiratory failure with hypoxia: Secondary | ICD-10-CM | POA: Diagnosis not present

## 2018-11-02 DIAGNOSIS — E890 Postprocedural hypothyroidism: Secondary | ICD-10-CM | POA: Diagnosis not present

## 2018-11-02 DIAGNOSIS — H9190 Unspecified hearing loss, unspecified ear: Secondary | ICD-10-CM | POA: Diagnosis not present

## 2018-11-02 DIAGNOSIS — M7061 Trochanteric bursitis, right hip: Secondary | ICD-10-CM | POA: Diagnosis not present

## 2018-11-02 DIAGNOSIS — I8000 Phlebitis and thrombophlebitis of superficial vessels of unspecified lower extremity: Secondary | ICD-10-CM | POA: Diagnosis not present

## 2018-11-02 DIAGNOSIS — I472 Ventricular tachycardia: Secondary | ICD-10-CM | POA: Diagnosis not present

## 2018-11-02 DIAGNOSIS — J449 Chronic obstructive pulmonary disease, unspecified: Secondary | ICD-10-CM | POA: Diagnosis not present

## 2018-11-02 DIAGNOSIS — N183 Chronic kidney disease, stage 3 (moderate): Secondary | ICD-10-CM | POA: Diagnosis not present

## 2018-11-02 DIAGNOSIS — J9811 Atelectasis: Secondary | ICD-10-CM | POA: Diagnosis not present

## 2018-11-08 NOTE — Telephone Encounter (Signed)
Nothing more to do prior to appt.  [name], MD

## 2018-11-10 NOTE — Progress Notes (Signed)
Cardiology Office Note This visit type was conducted due to national recommendations for restrictions regarding the COVID-19 Pandemic (e.g. social distancing) in an effort to limit this patient's exposure and mitigate transmission in our community.  Due to her co-morbid illnesses, this patient is at least at moderate risk for complications without adequate follow up.  This format is felt to be most appropriate for this patient at this time.  All issues noted in this document were discussed and addressed.  A limited physical exam was performed with this format.  Please refer to the patient's chart for her consent to telehealth for [facility].   Date:  11/11/2018   ID:  Kristin Hood, DOB Apr 20, 1940, MRN [id number]  PCP:  [name], MD  Cardiologist:  [name]     Pt location in Office Provider location In home office.  CC: post hospitalization for sepsis and did develop HF.  Also NSVT     History of Present Illness: Kristin Hood is a 78 y.o. female who presents for post hospitalization.   She has a hx of atrial fibrillation discovered on 08/20/12, chronic anticoagulation, COPD, prior stroke, received TPA for cerebral artery occlusion (could not raise arm-8/13)    Previous echocardiogram demonstrated mild to moderate left atrial enlargement, normal ejection fraction, mild aortic stenosis, MAC, mild mitral regurgitation.  Had 3 knee replacements.   She underwent catheterization on 08/14/11 which showed no evidence of coronary artery disease. LVEDP was 14 mm mercury.  Went to ER on 12/10/12 had a terrific pain in her chest. Lasted about hour at home BP 159/100, 3 times. Went on to ER. Was worried. Once she got to [facility] [place] she quit hurting. Sent to ER. Wanted to keep her overnight. Did troponin did well. Did OK. Prob GERD.   Went again to [facility]. Afib increased rate. PET scan. 3 nodules one thyroid. Endocrine, no good sample.   Unfortunately, her son-in-law died 2 weeks  after neck surgery from pulmonary embolism.  Aug 02, 2014 - Hip surgery. Dr. Wynelle Link. She has had extensive workup including oncology workup for nodules in her lungs, lymph nodes, thyroid nodule. She will be followed up on a yearly basis and oncology. Reassurance. Overall, no chest pain other than mild GERD at night after dinner. No syncope, no change in shortness of breath.  05/16/15-baseline shortness of breath. She did go to the emergency room because of centralized chest discomfort. Troponin was normal, EKG unremarkable. Thankfully she started taking Zantac and this was improved.  11/12/15-reassuring thyroid tissue upon excision. No cancer. Shortness of breath continues. Baseline. On Symbicort. COPD. Eliquis has been expensive. Trying to get assistance.  08/01/16 - AFIB, Jonne Ply) died. Right varicose vein. No significant shortness of breath. Occasionally she will still feel palpitations in the evening hours, around 4 PM. She has taken an extra Inderal at that time which seems to help area she recounted a story where she felt it racing, her grandson asked her if she wanted to go to dinner and she forgot about it and did not have any problems.   05/21/17 - 12/30/16 hospitalization. Combination from heart and lungs. Weight set at 240 when left the hospital.  Now her base weight is 235.  She understands to take an extra Lasix if necessary.  She has been doing a good job on maintaining this weight.  She still is quite deconditioned, debilitated.  Her back and hip pain is playing a major role in this.  She is not having  any significant chest discomfort.  Occasionally she will feel heart palpitations from her atrial fibrillation which is permanent.  She has not had any bleeding episodes.  When discussing her orthopedic issues, she understands that she would be high risk for any surgical procedures.  Whether this pertains to her toe or her back she understands.  Creat 1.1. Dr. Emiliano Dyer. Back. Prior stroke    06/04/2018- here for the follow-up of recent hospitalization with acute on chronic diastolic heart failure and hypoxic respiratory failure.  She was diuresed several liters, felt better.  Lasix was increased to 40 mg a day from 20.  She is maintaining her weight.  Atrial fibrillation stable.  Please see below for details.  Hospitalization records reviewed.  Now post hospital 09/2018 for acute pylonephritis. E-coli sepsis, acute diastolic HF. 2 doses of IV lasix.  Stable at discharge and resumed lasix 40 mg daily.  NSVT noted during hospitalization.  Otherwise her chronic a fib.  Labs were stable.  Held losartan at discharge.   EF 60-65%, Lt and Rt atrium sevely dilated.  Mild As, mild MS.  BP lower today, her losartan has been held.  She is on propranolol.   She has adjustable bed so head slightly raised and 2 pillows.  No recent chest pain.  And her wt has decreased since discharge.     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   Breast cancer ([place]) 2001   left mastectomy   Chest pain 12/11/2012   CHF (congestive heart failure) ([place]) 06/10/2016   Chronic atrial fibrillation 11/16/2014   Chronic diastolic heart failure ([place]) 02/13/2014   Coarse tremors    , Essential   Complication of anesthesia    SMALLER TUBE FOR INTUBATION AS GAGS ON TUBE   COPD (chronic obstructive pulmonary disease) ([place])    COPD exacerbation ([place]) 12/30/2016   CVA (cerebral vascular accident) [facility]) 2013   Dysrhythmia 08/20/2012   Atrial Fibrillation   Essential and other  specified forms of tremor 06/18/2012   Essential hypertension 06/18/2012   Family history of adverse reaction to anesthesia    mother experienced pain with intubation    GERD (gastroesophageal reflux disease)    history of    Hard of hearing    Heart murmur    History of chemotherapy 11/13/1998 to 2010   Adriamycin/Docotaxol, Dexorubicin/Taxotere, Femara, Tamoxifen   History of CVA (cerebrovascular accident) 12/11/2012   History of hiatal hernia    history of    Hx of adenomatous colonic polyps 2006   , Benign   Hypercholesteremia    Hyperlipidemia 02/13/2014   Hypertension    , With mild concentric left ventricular hypertrophy   Long term current use of anticoagulant therapy 12/25/2012   Mixed dyslipidemia    Mixed hyperlipidemia 12/25/2012   Morbid obesity ([place]) 06/18/2012   Phlebitis and thrombophlebitis of superficial vessels of lower extremities 12/25/2012   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   Pure hypercholesterolemia 12/25/2012   Shingles    Shortness of breath dyspnea    on exertion    SOB (shortness of breath)  11/16/2014   Spider veins    Stroke ([place]) 11/17/2011   left side brain-speech-TPA   Thyroid nodule    Tremor, essential    Unspecified cerebral artery occlusion with cerebral infarction 11/18/2011   Valvular sclerosis 09/23/2003   without stenosis   Varicose veins     Past Surgical History:  Procedure Laterality Date   APPENDECTOMY  07/10/1957   BIOPSY THYROID  12/01/2013   ultrasound   BREAST SURGERY     BUNIONECTOMY  11/29/1988   bilateral with hammer toes   CARDIAC CATHETERIZATION  07/2010   , Without significant CAD   CARDIAC CATHETERIZATION  07/2011   [name]   CESAREAN SECTION     CESAREAN SECTION  05/1968, 07/1965   EYE SURGERY  01/31/2013   eye lid; cataract surgery bilat    JOINT REPLACEMENT  05/2001   left knee   JOINT REPLACEMENT  11/2001   right knee   JOINT REPLACEMENT  01/2008   redo right  knee   KNEE SURGERY  2009   ,Total knee replacement   LEFT HEART CATHETERIZATION WITH CORONARY ANGIOGRAM N/A 08/14/2011   Procedure: LEFT HEART CATHETERIZATION WITH CORONARY ANGIOGRAM;  Surgeon: [name], MD;  Location[facility] C[facility] LAB;  Service: Cardiovascular;  Laterality: N/A;   Left Rotator Cuff     MASTECTOMY  10/19/1998   Radical, left -with lymph nodes (8 total)   Reverse bunionectomy  08/29/1993   removal of Tibial Sesamoid-left   TEE WITHOUT CARDIOVERSION  11/19/2011   Procedure: TRANSESOPHAGEAL ECHOCARDIOGRAM (TEE);  Surgeon: [name], MD;  Location: [facility] ENDOSCOPY;  Service: Cardiovascular;  Laterality: N/A;   THYROIDECTOMY N/A 03/02/2015   Procedure: TOTAL THYROIDECTOMY;  Surgeon: Armandina Gemma, MD;  Location: [facility] ORS;  Service: General;  Laterality: N/A;   TONSILLECTOMY     TOTAL HIP ARTHROPLASTY Left 10/11/2014   Procedure: LEFT TOTAL HIP ARTHROPLASTY ANTERIOR APPROACH;  Surgeon: [name], MD;  Location: [facility] ORS;  Service: Orthopedics;  Laterality: Left;     Current Outpatient Medications  Medication Sig Dispense Refill   albuterol (PROVENTIL HFA;VENTOLIN HFA) 108 (90 Base) MCG/ACT inhaler Inhale 2 puffs into the lungs every 4 (four) hours as needed for wheezing or shortness of breath. 1 Inhaler 0   atorvastatin (LIPITOR) 20 MG tablet Take 20 mg by mouth at bedtime.      budesonide (PULMICORT) 0.25 MG/2ML nebulizer solution Take 2 mLs (0.25 mg total) by nebulization 2 (two) times daily. 60 mL 12   ELIQUIS 5 MG TABS tablet TAKE 1 TABLET BY MOUTH TWICE A DAY (Patient taking differently: Take 5 mg by mouth 2 (two) times daily. ) 180 tablet 2   fenofibrate 160 MG tablet Take 160 mg by mouth at bedtime.      fluticasone (FLONASE) 50 MCG/ACT nasal spray Place 2 sprays into both nostrils daily as needed for allergies or rhinitis.      furosemide (LASIX) 40 MG tablet Take 1 tablet (40 mg total) by mouth daily. 90 tablet 3   guaiFENesin (MUCINEX) 600 MG 12 hr tablet  Take 600 mg by mouth daily.     levothyroxine (SYNTHROID, LEVOTHROID) 175 MCG tablet TAKE 1 TABLET BY MOUTH EVERY DAY BEFORE BREAKFAST (Patient taking differently: Take 175 mcg by mouth daily before breakfast. ) 90 tablet 3   loratadine (CLARITIN) 10 MG tablet Take 10 mg by mouth daily as needed for allergies (rash).      Multiple Vitamin (MULTIVITAMIN WITH MINERALS) TABS tablet Take 1 tablet by mouth daily.  polyvinyl alcohol (ARTIFICIAL TEARS) 1.4 % ophthalmic solution Place 1 drop into both eyes daily as needed for dry eyes.     propranolol ER (INDERAL LA) 60 MG 24 hr capsule TAKE 2 CAPS IN THE MORNING TAKE 1 CAP IN THE AFTERNOON, 1 CAP AT NIGHT (Patient taking differently: Take 60-120 mg by mouth See admin instructions. Take 2 capsule in the morning then take 1 capsule at bedtime) 360 capsule 3   Tiotropium Bromide Monohydrate (SPIRIVA RESPIMAT) 2.5 MCG/ACT AERS Inhale 2 puffs into the lungs daily. 3 Inhaler 4   No current facility-administered medications for this visit.     Allergies:   Floxin [ofloxacin]    Social History:  The patient  reports that she quit smoking about 26 years ago. Her smoking use included cigarettes. She has a 60.00 pack-year smoking history. She has never used smokeless tobacco. She reports current alcohol use of about 1.0 standard drinks of alcohol per week. She reports that she does not use drugs.   Family History:  The patient's family history includes Aortic stenosis in her mother; Heart attack in her father; Heart disease in her father and mother; Heart failure in her father.    ROS:  General:no colds or fevers, no weight changes Skin:no rashes or ulcers HEENT:no blurred vision, no congestion CV:see HPI PUL:see HPI GI:no diarrhea constipation or melena, no indigestion GU:no hematuria, no dysuria MS:no joint pain, no claudication Neuro:no syncope, no lightheadedness Endo:no diabetes, + thyroid disease  Wt Readings from Last 3 Encounters:    11/11/18 210 lb 12.8 oz (95.6 kg)  10/28/18 212 lb 6.4 oz (96.3 kg)  08/24/18 217 lb 8 oz (98.7 kg)     PHYSICAL EXAM: VS:  BP 110/64    Pulse (!) 55    Ht _0  (1.676 m)    Wt 210 lb 12.8 oz (95.6 kg)    SpO2 97%    BMI 34.02 kg/m  , BMI Body mass index is 34.02 kg/m. General:Pleasant affect, NAD Lungs:can speak in complete sentences without SOB or wheezes. JYN:WGNF denies Lower ext edema Neuro:alert and oriented X 3, + facial symmetry    EKG:  EKG is NOT ordered today. The ekg from the hospital with A fib rate stable and no acute ST changes. Tele with 11 beats of wide complex tach, NSVT     Recent Labs: 10/24/2018: B Natriuretic Peptide 498.3 10/25/2018: ALT 21; Platelets 256 10/28/2018: BUN 30; Creatinine, Ser 1.05; Hemoglobin 10.8; Magnesium 2.1; Potassium 4.5; Sodium 134; TSH 1.827    Lipid Panel    Component Value Date/Time   CHOL 148 11/18/2011 0420   TRIG 101 11/18/2011 0420   HDL 34 (L) 11/18/2011 0420   CHOLHDL 4.4 11/18/2011 0420   VLDL 20 11/18/2011 0420   LDLCALC 94 11/18/2011 0420       Other studies Reviewed: Additional studies/ records that were reviewed today include: . Echo 10/27/18 IMPRESSIONS    1. The left ventricle has normal systolic function with an ejection fraction of 60-65%. The cavity size was normal. Left ventricular diastolic function could not be evaluated secondary to atrial fibrillation. Elevated left ventricular end-diastolic  pressure.  2. The right ventricle has normal systolic function. The cavity was normal. There is no increase in right ventricular wall thickness. Right ventricular systolic pressure is moderately elevated.  3. Left atrial size was severely dilated.  4. Right atrial size was severely dilated.  5. The aortic valve is tricuspid. Moderate thickening of the aortic valve. Moderate calcification  of the aortic valve. Aortic valve regurgitation is mild by color flow Doppler. Mild stenosis of the aortic valve. Moderate  aortic annular calcification  noted.  6. The mitral valve is degenerative. Mild thickening of the mitral valve leaflet. Mild calcification of the mitral valve leaflet. There is moderate to severe mitral annular calcification present. Mild mitral valve stenosis.  7. The aorta is normal in size and structure.  8. The inferior vena cava was dilated in size with <50% respiratory variability.  9. The interatrial septum appears to be lipomatous.  FINDINGS  Left Ventricle: The left ventricle has normal systolic function, with an ejection fraction of 60-65%. The cavity size was normal. There is no increase in left ventricular wall thickness. Left ventricular diastolic function could not be evaluated  secondary to atrial fibrillation. Elevated left ventricular end-diastolic pressure  Right Ventricle: The right ventricle has normal systolic function. The cavity was normal. There is no increase in right ventricular wall thickness. Right ventricular systolic pressure is moderately elevated.  Left Atrium: Left atrial size was severely dilated.  Right Atrium: Right atrial size was severely dilated.  Interatrial Septum: No atrial level shunt detected by color flow Doppler. Increased thickness of the atrial septum sparing the fossa ovalis consistent with The interatrial septum appears to be lipomatous.  Pericardium: There is no evidence of pericardial effusion.  Mitral Valve: The mitral valve is degenerative in appearance. Mild thickening of the mitral valve leaflet. Mild calcification of the mitral valve leaflet. There is moderate to severe mitral annular calcification present. Mitral valve regurgitation is  mild by color flow Doppler. Mild mitral valve stenosis.  Tricuspid Valve: The tricuspid valve is normal in structure. Tricuspid valve regurgitation is mild by color flow Doppler.  Aortic Valve: The aortic valve is tricuspid Moderate thickening of the aortic valve. Moderate calcification of  the aortic valve. Aortic valve regurgitation is mild by color flow Doppler. There is Mild stenosis of the aortic valve, with a calculated valve  area of 1.49 cm. Moderate aortic annular calcification noted.  Pulmonic Valve: The pulmonic valve was normal in structure. Pulmonic valve regurgitation is not visualized by color flow Doppler.  Aorta: The aorta is normal in size and structure.  Venous: The inferior vena cava measures 2.97 cm, is dilated in size with less than 50% respiratory variability.   ASSESSMENT AND PLAN:  1.  Recent acute on chronic diastolic HF secondary to sepsis.  Improved with diuretics.   Now back on Lasix 40 daily, her usual home dose.  Her BP is soft so losartan was stopped, will not add back just yet, and will check BMP.    2.  NSVT maybe aberrancy but will have her wear 48 hour monitor to eval for VT burden.  Her K+ and Mg+ were normal at time in hospital.   3.  Permanent atrial fib.  Rate controlled on Inderall without change.   4.  anticoagulation on  eliquis with no bleeding.   5.  Non hodgkin's lymphoma - followed by Dr. [name]  6.  Obesity with wt loss, now wt at 210.   7.  HTN will check labs but hold off on adding losartan for now with soft BP   Current medicines are reviewed with the patient today.  The patient Has no concerns regarding medicines.  The following changes have been made:  See above Labs/ tests ordered today include:see above  Visit of 15 min. Questions answered, pt aware of Masks, washing hands and social distancing for  COVID  Disposition:   FU:  see above  Signed, Cecilie Kicks, NP  11/11/2018 10:57 AM    [address] [place], [place] Phone: [phone]; Fax: [phone]

## 2018-11-11 ENCOUNTER — Telehealth: Payer: PPO | Admitting: Cardiology

## 2018-11-11 ENCOUNTER — Telehealth: Payer: Self-pay | Admitting: *Deleted

## 2018-11-11 ENCOUNTER — Encounter: Payer: Self-pay | Admitting: Cardiology

## 2018-11-11 ENCOUNTER — Other Ambulatory Visit: Payer: Self-pay

## 2018-11-11 VITALS — BP 110/64 | HR 55 | Ht 66.0 in | Wt 210.8 lb

## 2018-11-11 DIAGNOSIS — Z7901 Long term (current) use of anticoagulants: Secondary | ICD-10-CM

## 2018-11-11 DIAGNOSIS — J441 Chronic obstructive pulmonary disease with (acute) exacerbation: Secondary | ICD-10-CM | POA: Diagnosis not present

## 2018-11-11 DIAGNOSIS — I4821 Permanent atrial fibrillation: Secondary | ICD-10-CM

## 2018-11-11 DIAGNOSIS — R899 Unspecified abnormal finding in specimens from other organs, systems and tissues: Secondary | ICD-10-CM | POA: Diagnosis not present

## 2018-11-11 DIAGNOSIS — Z8679 Personal history of other diseases of the circulatory system: Secondary | ICD-10-CM | POA: Diagnosis not present

## 2018-11-11 DIAGNOSIS — E890 Postprocedural hypothyroidism: Secondary | ICD-10-CM | POA: Diagnosis not present

## 2018-11-11 DIAGNOSIS — I5032 Chronic diastolic (congestive) heart failure: Secondary | ICD-10-CM

## 2018-11-11 DIAGNOSIS — I472 Ventricular tachycardia, unspecified: Secondary | ICD-10-CM

## 2018-11-11 DIAGNOSIS — I10 Essential (primary) hypertension: Secondary | ICD-10-CM

## 2018-11-11 DIAGNOSIS — Z87448 Personal history of other diseases of urinary system: Secondary | ICD-10-CM | POA: Diagnosis not present

## 2018-11-11 DIAGNOSIS — Z6835 Body mass index (BMI) 35.0-35.9, adult: Secondary | ICD-10-CM | POA: Diagnosis not present

## 2018-11-11 DIAGNOSIS — D649 Anemia, unspecified: Secondary | ICD-10-CM | POA: Diagnosis not present

## 2018-11-11 LAB — BASIC METABOLIC PANEL
BUN/Creatinine Ratio: 28 (ref 12–28)
BUN: 35 mg/dL — ABNORMAL HIGH (ref 8–27)
CO2: 22 mmol/L (ref 20–29)
Calcium: 9.2 mg/dL (ref 8.7–10.3)
Chloride: 100 mmol/L (ref 96–106)
Creatinine, Ser: 1.27 mg/dL — ABNORMAL HIGH (ref 0.57–1.00)
GFR calc Af Amer: 46 mL/min/{1.73_m2} — ABNORMAL LOW (ref >59)
GFR calc non Af Amer: 40 mL/min/{1.73_m2} — ABNORMAL LOW (ref >59)
Glucose: 127 mg/dL — ABNORMAL HIGH (ref 65–99)
Potassium: 4.3 mmol/L (ref 3.5–5.2)
Sodium: 138 mmol/L (ref 134–144)

## 2018-11-11 NOTE — Telephone Encounter (Signed)
3 day Zio XT long term holter monitor to be mailed to patients home.  Instructions reviewed briefly as they are included in the monitor kit.   Patient aware she needs to wear patch at least 48 hours.

## 2018-11-11 NOTE — Patient Instructions (Addendum)
Medication Instructions:  Your physician recommends that you continue on your current medications as directed. Please refer to the Current Medication list given to you today.  If you need a refill on your cardiac medications before your next appointment, please call your pharmacy.   Lab work: TODAY:  BMET  If you have labs (blood work) drawn today and your tests are completely normal, you will receive your results only by: Marland Kitchen MyChart Message (if you have MyChart) OR . A paper copy in the mail If you have any lab test that is abnormal or we need to change your treatment, we will call you to review the results.  Testing/Procedures: Your physician has recommended that you wear an 48 HOUR event monitor. SOMEONE WILL CALL YOU TO ARRANGE THIS.  Event monitors are medical devices that record the heart's electrical activity. Doctors most often [place] these monitors to diagnose arrhythmias. Arrhythmias are problems with the speed or rhythm of the heartbeat. The monitor is a small, portable device. You can wear one while you do your normal daily activities. This is usually used to diagnose what is causing palpitations/syncope (passing out).    Follow-Up: At [facility], you and your health needs are our priority.  As part of our continuing mission to provide you with exceptional heart care, we have created designated Provider Care Teams.  These Care Teams include your primary Cardiologist (physician) and Advanced Practice Providers (APPs -  Physician Assistants and Nurse Practitioners) who all work together to provide you with the care you need, when you need it. You will need a follow up appointment in 3 months.  Please call our office 2 months in advance to schedule this appointment.  You may see [name] or one of the following Advanced Practice Providers on your designated Care Team:   Truitt Merle, NP Cecilie Kicks, NP . Kathyrn Drown, NP  Any Other Special Instructions Will Be Listed Below (If  Applicable).

## 2018-11-12 ENCOUNTER — Other Ambulatory Visit: Payer: Self-pay | Admitting: Internal Medicine

## 2018-11-12 DIAGNOSIS — Z1231 Encounter for screening mammogram for malignant neoplasm of breast: Secondary | ICD-10-CM

## 2018-11-15 ENCOUNTER — Ambulatory Visit (INDEPENDENT_AMBULATORY_CARE_PROVIDER_SITE_OTHER): Payer: PPO

## 2018-11-15 DIAGNOSIS — I472 Ventricular tachycardia, unspecified: Secondary | ICD-10-CM

## 2018-11-16 ENCOUNTER — Other Ambulatory Visit: Payer: Self-pay

## 2018-11-16 ENCOUNTER — Encounter: Payer: Self-pay | Admitting: Internal Medicine

## 2018-11-16 ENCOUNTER — Ambulatory Visit: Payer: PPO | Admitting: Internal Medicine

## 2018-11-16 VITALS — BP 128/70 | HR 75 | Ht 66.0 in | Wt 210.0 lb

## 2018-11-16 DIAGNOSIS — E890 Postprocedural hypothyroidism: Secondary | ICD-10-CM | POA: Diagnosis not present

## 2018-11-16 DIAGNOSIS — M19032 Primary osteoarthritis, left wrist: Secondary | ICD-10-CM | POA: Diagnosis not present

## 2018-11-16 DIAGNOSIS — K8020 Calculus of gallbladder without cholecystitis without obstruction: Secondary | ICD-10-CM | POA: Diagnosis not present

## 2018-11-16 DIAGNOSIS — J9811 Atelectasis: Secondary | ICD-10-CM | POA: Diagnosis not present

## 2018-11-16 DIAGNOSIS — I083 Combined rheumatic disorders of mitral, aortic and tricuspid valves: Secondary | ICD-10-CM | POA: Diagnosis not present

## 2018-11-16 DIAGNOSIS — M48061 Spinal stenosis, lumbar region without neurogenic claudication: Secondary | ICD-10-CM | POA: Diagnosis not present

## 2018-11-16 DIAGNOSIS — N183 Chronic kidney disease, stage 3 (moderate): Secondary | ICD-10-CM | POA: Diagnosis not present

## 2018-11-16 DIAGNOSIS — I130 Hypertensive heart and chronic kidney disease with heart failure and stage 1 through stage 4 chronic kidney disease, or unspecified chronic kidney disease: Secondary | ICD-10-CM | POA: Diagnosis not present

## 2018-11-16 DIAGNOSIS — J449 Chronic obstructive pulmonary disease, unspecified: Secondary | ICD-10-CM | POA: Diagnosis not present

## 2018-11-16 DIAGNOSIS — Z9889 Other specified postprocedural states: Secondary | ICD-10-CM | POA: Diagnosis not present

## 2018-11-16 DIAGNOSIS — K5730 Diverticulosis of large intestine without perforation or abscess without bleeding: Secondary | ICD-10-CM | POA: Diagnosis not present

## 2018-11-16 DIAGNOSIS — I472 Ventricular tachycardia: Secondary | ICD-10-CM | POA: Diagnosis not present

## 2018-11-16 DIAGNOSIS — I482 Chronic atrial fibrillation, unspecified: Secondary | ICD-10-CM | POA: Diagnosis not present

## 2018-11-16 DIAGNOSIS — N10 Acute tubulo-interstitial nephritis: Secondary | ICD-10-CM | POA: Diagnosis not present

## 2018-11-16 DIAGNOSIS — I8000 Phlebitis and thrombophlebitis of superficial vessels of unspecified lower extremity: Secondary | ICD-10-CM | POA: Diagnosis not present

## 2018-11-16 DIAGNOSIS — I700 Atherosclerosis of aorta: Secondary | ICD-10-CM | POA: Diagnosis not present

## 2018-11-16 DIAGNOSIS — I872 Venous insufficiency (chronic) (peripheral): Secondary | ICD-10-CM | POA: Diagnosis not present

## 2018-11-16 DIAGNOSIS — C8580 Other specified types of non-Hodgkin lymphoma, unspecified site: Secondary | ICD-10-CM | POA: Diagnosis not present

## 2018-11-16 DIAGNOSIS — M7061 Trochanteric bursitis, right hip: Secondary | ICD-10-CM | POA: Diagnosis not present

## 2018-11-16 DIAGNOSIS — G252 Other specified forms of tremor: Secondary | ICD-10-CM | POA: Diagnosis not present

## 2018-11-16 DIAGNOSIS — J9601 Acute respiratory failure with hypoxia: Secondary | ICD-10-CM | POA: Diagnosis not present

## 2018-11-16 DIAGNOSIS — I0981 Rheumatic heart failure: Secondary | ICD-10-CM | POA: Diagnosis not present

## 2018-11-16 DIAGNOSIS — H9190 Unspecified hearing loss, unspecified ear: Secondary | ICD-10-CM | POA: Diagnosis not present

## 2018-11-16 DIAGNOSIS — I5033 Acute on chronic diastolic (congestive) heart failure: Secondary | ICD-10-CM | POA: Diagnosis not present

## 2018-11-16 DIAGNOSIS — A4151 Sepsis due to Escherichia coli [E. coli]: Secondary | ICD-10-CM | POA: Diagnosis not present

## 2018-11-16 MED ORDER — LEVOTHYROXINE SODIUM 175 MCG PO TABS: ORAL_TABLET | ORAL | 3 refills | Status: DC

## 2018-11-16 NOTE — Progress Notes (Signed)
Patient ID: Kristin Hood, female   DOB: [birth year], 78 y.o.   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 Last visit 1 year ago.  Since last visit, she was admitted with sepsis 09/2018 2/2 UTI.  Feeling better now.  Reviewed history: She described having L-sided chest pressure (11/11/2013) >> went to the ED: found to be in A fib. Also had a Chest CT: Mediastinal and retroperitoneal lymph nodes, so a PET scan was recommended: a thyroid nodule appeared hypermetabolic.   PET scan (07/37/1062): hypermetabolic R thyroid nodule  Thyroid U/S (11/21/2013): R 2.7 x 1.8 cm nodule, heterogeneous, with coarse calcifications  R nodule Bx (12/01/2013): FLUS  11/30/2013 TSH: 6.84 (0.34-4.5).  R nodule Bx (07/11/2014): FLUS  07/31/2014: Called and d/w pt about the results of the Afirma test, which is "suspicious for neoplasm" (test results scanned under Media tab). This carries a risk of 75% for cancer. In this case, I suggested total thyroidectomy.   03/02/2015: Total thyroidectomy - Dr Harlow Asa >> benign pathology! (surprisingly)  Pt is on levothyroxine 175 mcg daily, taken: - in am - fasting - at least 30 min from b'fast - + Fe, MVI-at noon - not on calcium or biotin. Stopped Omeprazole.   Reviewed her TFTs: Lab Results  Component Value Date   TSH 1.827 10/28/2018   TSH 12.46 (H) 10/27/2017   TSH 3.00 10/27/2016   TSH 4.54 (H) 03/04/2016   TSH 6.46 (H) 11/19/2015   TSH 17.91 (H) 10/03/2015   TSH 25.94 (H) 08/22/2015   TSH 31.84 (H) 07/20/2015   TSH 36.21 (H) 06/06/2015   TSH 4.02 12/11/2014   FREET4 1.43 10/27/2017   FREET4 1.59 10/27/2016   FREET4 1.66 (H) 03/04/2016   FREET4 1.55 11/19/2015   FREET4 1.26 10/03/2015   FREET4 1.00 08/22/2015   FREET4 1.04 07/20/2015   FREET4 1.00 06/06/2015   FREET4 1.13 12/11/2014  04/11/2015: TSH  40.5  Pt denies: - feeling nodules in neck - hoarseness - dysphagia - choking - SOB with lying down  Pt does have a FH of thyroid ds.: mother (hypothyroidism). No FH of thyroid cancer. No h/o radiation tx to head or neck.  No seaweed or kelp. No recent contrast studies. No herbal supplements. No Biotin use. No recent steroids use, but was on this x3 days after discharge.   She was admitted with Acute respiratory failure and CHF 05/2016 >> started Lasix >> lost 20 lbs. She sees [name]. She lost her husband after he fell and had a concussion in 06/2016.  She has a diagnosis of breast cancer diagnosed in 2000.  She had chemotherapy.  ROS: Constitutional: no weight gain/no weight loss, no fatigue, no subjective hyperthermia, no subjective hypothermia Eyes: no blurry vision, no xerophthalmia ENT: no sore throat, + see HPI Cardiovascular: no CP/no SOB/no palpitations/no leg swelling Respiratory: no cough/no SOB/no wheezing Gastrointestinal: no N/no V/no D/no C/no acid reflux Musculoskeletal: no muscle aches/no joint aches Skin: no rashes, no hair loss Neurological: no tremors/no numbness/no tingling/no dizziness  I reviewed pt's medications, allergies, PMH, social hx, family hx, and changes were documented in the history of present illness. Otherwise, unchanged from my initial visit note.  Past Medical History:  Diagnosis Date  . Acute congestive heart failure ([place])   . Acute diastolic CHF (congestive heart failure) ([place]) 12/30/2016  . Acute respiratory failure with  hypoxemia ([place]) 06/09/2016  . Acute respiratory failure with hypoxia ([place]) 12/30/2016  . Anginal pain ([place])   . ARF (acute renal failure) ([place]) 06/11/2016  . Arthritis   . Atrial fibrillation ([place]) 07/2012   Eliquis, rate controlled  . Benign hypertensive heart disease without heart failure 12/25/2012  . Breast cancer ([place]) 2001   left mastectomy  . Chest pain 12/11/2012  . CHF (congestive heart failure) ([place])  06/10/2016  . Chronic atrial fibrillation 11/16/2014  . Chronic diastolic heart failure ([place]) 02/13/2014  . Coarse tremors    , Essential  . Complication of anesthesia    SMALLER TUBE FOR INTUBATION AS GAGS ON TUBE  . COPD (chronic obstructive pulmonary disease) ([place])   . COPD exacerbation ([place]) 12/30/2016  . CVA (cerebral vascular accident) ([place]) 2013  . Dysrhythmia 08/20/2012   Atrial Fibrillation  . Essential and other specified forms of tremor 06/18/2012  . Essential hypertension 06/18/2012  . Family history of adverse reaction to anesthesia    mother experienced pain with intubation   . GERD (gastroesophageal reflux disease)    history of   . Hard of hearing   . Heart murmur   . History of chemotherapy 11/13/1998 to 2010   Adriamycin/Docotaxol, Dexorubicin/Taxotere, Femara, Tamoxifen  . History of CVA (cerebrovascular accident) 12/11/2012  . History of hiatal hernia    history of   . Hx of adenomatous colonic polyps 2006   , Benign  . Hypercholesteremia   . Hyperlipidemia 02/13/2014  . Hypertension    , With mild concentric left ventricular hypertrophy  . Long term current use of anticoagulant therapy 12/25/2012  . Mixed dyslipidemia   . Mixed hyperlipidemia 12/25/2012  . Morbid obesity ([place]) 06/18/2012  . Phlebitis and thrombophlebitis of superficial vessels of lower extremities 12/25/2012  . 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 Pure hypercholesterolemia 12/25/2012  . Shingles   . Shortness of breath dyspnea    on exertion   . SOB (shortness of breath) 11/16/2014  . Spider veins   . Stroke ([place]) 11/17/2011   left side brain-speech-TPA  . Thyroid nodule   . Tremor, essential   . Unspecified cerebral artery occlusion with cerebral infarction 11/18/2011  . Valvular sclerosis 09/23/2003   without stenosis  . Varicose veins    Past Surgical History:  Procedure Laterality Date  . APPENDECTOMY  07/10/1957  . BIOPSY THYROID  12/01/2013   ultrasound  . BREAST SURGERY    .  BUNIONECTOMY  11/29/1988   bilateral with hammer toes  . CARDIAC CATHETERIZATION  07/2010   , Without significant CAD  . CARDIAC CATHETERIZATION  07/2011   [name]  . CESAREAN SECTION    . CESAREAN SECTION  05/1968, 07/1965  . EYE SURGERY  01/31/2013   eye lid; cataract surgery bilat   . JOINT REPLACEMENT  05/2001   left knee  . JOINT REPLACEMENT  11/2001   right knee  . JOINT REPLACEMENT  01/2008   redo right knee  . KNEE SURGERY  2009   ,Total knee replacement  . LEFT HEART CATHETERIZATION WITH CORONARY ANGIOGRAM N/A 08/14/2011   Procedure: LEFT HEART CATHETERIZATION WITH CORONARY ANGIOGRAM;  Surgeon: [name], MD;  Location[facility] C[facility] LAB;  Service: Cardiovascular;  Laterality: N/A;  . Left Rotator Cuff    . MASTECTOMY  10/19/1998   Radical, left -with lymph nodes (8 total)  . Reverse bunionectomy  08/29/1993   removal of Tibial Sesamoid-left  . TEE WITHOUT CARDIOVERSION  11/19/2011   Procedure: TRANSESOPHAGEAL ECHOCARDIOGRAM (  TEE);  Surgeon: [name], MD;  Location: [facility] ENDOSCOPY;  Service: Cardiovascular;  Laterality: N/A;  . THYROIDECTOMY N/A 03/02/2015   Procedure: TOTAL THYROIDECTOMY;  Surgeon: Armandina Gemma, MD;  Location: [facility] ORS;  Service: General;  Laterality: N/A;  . TONSILLECTOMY    . TOTAL HIP ARTHROPLASTY Left 10/11/2014   Procedure: LEFT TOTAL HIP ARTHROPLASTY ANTERIOR APPROACH;  Surgeon: [name], MD;  Location: [facility] ORS;  Service: Orthopedics;  Laterality: Left;   History   Social History  . Marital Status: Married    Spouse Name: N/A    Number of Children: 2   Occupational History  . homemaker   Social History Main Topics  . Smoking status: Former Smoker -- 2.50 packs/day for 13 years    Types: Cigarettes    Quit date: 01/30/1992  . Smokeless tobacco: Never Used  . Alcohol Use:     3 drink(s) per week  . Drug Use: No   Current Outpatient Medications on File Prior to Visit  Medication Sig Dispense Refill  . albuterol (PROVENTIL HFA;VENTOLIN  HFA) 108 (90 Base) MCG/ACT inhaler Inhale 2 puffs into the lungs every 4 (four) hours as needed for wheezing or shortness of breath. 1 Inhaler 0  . atorvastatin (LIPITOR) 20 MG tablet Take 20 mg by mouth at bedtime.     . budesonide (PULMICORT) 0.25 MG/2ML nebulizer solution Take 2 mLs (0.25 mg total) by nebulization 2 (two) times daily. 60 mL 12  . ELIQUIS 5 MG TABS tablet TAKE 1 TABLET BY MOUTH TWICE A DAY (Patient taking differently: Take 5 mg by mouth 2 (two) times daily. ) 180 tablet 2  . fenofibrate 160 MG tablet Take 160 mg by mouth at bedtime.     . fluticasone (FLONASE) 50 MCG/ACT nasal spray Place 2 sprays into both nostrils daily as needed for allergies or rhinitis.     . furosemide (LASIX) 40 MG tablet Take 1 tablet (40 mg total) by mouth daily. 90 tablet 3  . guaiFENesin (MUCINEX) 600 MG 12 hr tablet Take 600 mg by mouth daily.    Marland Kitchen levothyroxine (SYNTHROID, LEVOTHROID) 175 MCG tablet TAKE 1 TABLET BY MOUTH EVERY DAY BEFORE BREAKFAST (Patient taking differently: Take 175 mcg by mouth daily before breakfast. ) 90 tablet 3  . loratadine (CLARITIN) 10 MG tablet Take 10 mg by mouth daily as needed for allergies (rash).     . Multiple Vitamin (MULTIVITAMIN WITH MINERALS) TABS tablet Take 1 tablet by mouth daily.     . polyvinyl alcohol (ARTIFICIAL TEARS) 1.4 % ophthalmic solution Place 1 drop into both eyes daily as needed for dry eyes.    Marland Kitchen propranolol ER (INDERAL LA) 60 MG 24 hr capsule TAKE 2 CAPS IN THE MORNING TAKE 1 CAP IN THE AFTERNOON, 1 CAP AT NIGHT (Patient taking differently: Take 60-120 mg by mouth See admin instructions. Take 2 capsule in the morning then take 1 capsule at bedtime) 360 capsule 3  . Tiotropium Bromide Monohydrate (SPIRIVA RESPIMAT) 2.5 MCG/ACT AERS Inhale 2 puffs into the lungs daily. 3 Inhaler 4   No current facility-administered medications on file prior to visit.    Allergies  Allergen Reactions  . Floxin [Ofloxacin] Nausea Only and Other (See Comments)     Dizziness, Vertigo    Family History  Problem Relation Age of Onset  . Heart disease Mother   . Aortic stenosis Mother   . Heart disease Father   . Heart failure Father   . Heart attack Father  PE: BP 128/70   Pulse 75   Ht 5\' 6"  (1.676 m)   Wt 210 lb (95.3 kg)   SpO2 97%   BMI 33.89 kg/m  Body mass index is 34.02 kg/m. Wt Readings from Last 3 Encounters:  11/16/18 210 lb (95.3 kg)  11/11/18 210 lb 12.8 oz (95.6 kg)  10/28/18 212 lb 6.4 oz (96.3 kg)   Constitutional: overweight, in NAD Eyes: PERRLA, EOMI, no exophthalmos ENT: moist mucous membranes, no neck masses, thyroidectomy scar healed, no cervical lymphadenopathy Cardiovascular: RRR, No MRG Respiratory: CTA B Gastrointestinal: abdomen soft, NT, ND, BS+ Musculoskeletal: no deformities, strength intact in all 4 Skin: moist, warm, no rashes Neurological: + tremor with outstretched hands, DTR 0/4 in bilateral knees (due to previous surgery)  ASSESSMENT: 1. Postoperative hypothyroidism - h/o R thyroid nodule, hypermetabolic on PET, FLUS x2 on Bx, Afirma + >> benign final pathology  2. S/p total thyroidectomy for R thyroid nodule  PLAN: 1. Postoperative hypothyroidism Patient with a history of a right large thyroid nodule, which appeared to contain microcalcifications, appeared hypermetabolic on PET scan, had 2x FLUS biopsies and a positive AFIRMA molecular marker test.  With all the above factors pointing to her thyroid cancer, I recommended thyroidectomy which she had in 03/2015.  Very surprisingly, the final pathology was benign - latest thyroid labs reviewed with pt >> normal 3 weeks ago while in the hospital Lab Results  Component Value Date   TSH 1.827 10/28/2018  - she continues on LT4 175 mcg daily - pt feels good on this dose. - we discussed about taking the thyroid hormone every day, with water, >30 minutes before breakfast, separated by >4 hours from acid reflux medications, calcium, iron, multivitamins.  Pt. is taking it correctly. - I refilled her levothyroxine prescription - We discussed about maybe repeating another TSH at her next visit with PCP in December ([facility])  2. S/p total thyroidectomy for R thyroid nodule -No neck compression symptoms -No masses palpated in neck at today's visit and no dysesthesia at the surgical site  Philemon Kingdom, MD PhD [facility]

## 2018-11-16 NOTE — Patient Instructions (Signed)
Please continue Synthroid 175 mcg daily.  Take the thyroid hormone every day, with water, at least 30 minutes before breakfast, separated by at least 4 hours from: - acid reflux medications - calcium - iron - multivitamins  Please come back for a follow-up appointment in 1 year.  

## 2018-11-17 ENCOUNTER — Other Ambulatory Visit: Payer: Self-pay | Admitting: Pharmacist

## 2018-11-17 DIAGNOSIS — J441 Chronic obstructive pulmonary disease with (acute) exacerbation: Secondary | ICD-10-CM | POA: Diagnosis not present

## 2018-11-17 DIAGNOSIS — J449 Chronic obstructive pulmonary disease, unspecified: Secondary | ICD-10-CM | POA: Diagnosis not present

## 2018-11-17 NOTE — Patient Outreach (Signed)
[facility]) Care Management  11/17/2018  Kristin Hood May 18, 1940 [id number]   Patient called to report she thinks she has spent enough money to apply for Eliquis through [place] program.  Patient's PCP is [name] and he is part of an [employer] and have them embedded in their clinics.  The pharmacist for Dr. Eddie Dibbles clinic is Louanne Skye.   Patient said she gets her medications at [facility] and has met with Louanne Skye at her PCP's office. Patient was instructed to reach out to Louanne Skye about patient assistance.  A message was left on Remo Lipps Little's voicemail about patient assistance for Eliquis.  Plan: Patient's case will remain closed.  [name], PharmD, [place] Clinical Pharmacist [phone]

## 2018-11-23 ENCOUNTER — Telehealth: Payer: Self-pay | Admitting: *Deleted

## 2018-11-23 DIAGNOSIS — Z79899 Other long term (current) drug therapy: Secondary | ICD-10-CM

## 2018-11-23 MED ORDER — FUROSEMIDE 40 MG PO TABS: ORAL_TABLET | ORAL | 3 refills | Status: DC

## 2018-11-23 NOTE — Telephone Encounter (Signed)
-----   Message from Isaiah Serge, NP sent at 11/12/2018  8:04 AM EDT ----- pls let pt know her kidney function is a little stressed, change lasix to 40 mg alt with 20 mg every other day for 1 weeks then back to 40 mg daily.  Recheck BMP in 1 week.  Thanks.

## 2018-11-25 DIAGNOSIS — I472 Ventricular tachycardia: Secondary | ICD-10-CM | POA: Diagnosis not present

## 2018-11-29 ENCOUNTER — Other Ambulatory Visit: Payer: Self-pay

## 2018-11-29 ENCOUNTER — Other Ambulatory Visit: Payer: PPO

## 2018-11-29 DIAGNOSIS — E782 Mixed hyperlipidemia: Secondary | ICD-10-CM | POA: Diagnosis not present

## 2018-11-29 DIAGNOSIS — I10 Essential (primary) hypertension: Secondary | ICD-10-CM | POA: Diagnosis not present

## 2018-11-29 DIAGNOSIS — Z853 Personal history of malignant neoplasm of breast: Secondary | ICD-10-CM | POA: Diagnosis not present

## 2018-11-29 DIAGNOSIS — M19041 Primary osteoarthritis, right hand: Secondary | ICD-10-CM | POA: Diagnosis not present

## 2018-11-29 DIAGNOSIS — J441 Chronic obstructive pulmonary disease with (acute) exacerbation: Secondary | ICD-10-CM | POA: Diagnosis not present

## 2018-11-29 DIAGNOSIS — E890 Postprocedural hypothyroidism: Secondary | ICD-10-CM | POA: Diagnosis not present

## 2018-11-29 DIAGNOSIS — Z79899 Other long term (current) drug therapy: Secondary | ICD-10-CM | POA: Diagnosis not present

## 2018-11-29 DIAGNOSIS — I509 Heart failure, unspecified: Secondary | ICD-10-CM | POA: Diagnosis not present

## 2018-11-29 DIAGNOSIS — I4891 Unspecified atrial fibrillation: Secondary | ICD-10-CM | POA: Diagnosis not present

## 2018-11-29 DIAGNOSIS — J449 Chronic obstructive pulmonary disease, unspecified: Secondary | ICD-10-CM | POA: Diagnosis not present

## 2018-11-29 DIAGNOSIS — I5032 Chronic diastolic (congestive) heart failure: Secondary | ICD-10-CM | POA: Diagnosis not present

## 2018-11-29 LAB — BASIC METABOLIC PANEL
BUN/Creatinine Ratio: 25 (ref 12–28)
BUN: 34 mg/dL — ABNORMAL HIGH (ref 8–27)
CO2: 23 mmol/L (ref 20–29)
Calcium: 9.1 mg/dL (ref 8.7–10.3)
Chloride: 100 mmol/L (ref 96–106)
Creatinine, Ser: 1.37 mg/dL — ABNORMAL HIGH (ref 0.57–1.00)
GFR calc Af Amer: 42 mL/min/{1.73_m2} — ABNORMAL LOW (ref >59)
GFR calc non Af Amer: 37 mL/min/{1.73_m2} — ABNORMAL LOW (ref >59)
Glucose: 110 mg/dL — ABNORMAL HIGH (ref 65–99)
Potassium: 4.8 mmol/L (ref 3.5–5.2)
Sodium: 138 mmol/L (ref 134–144)

## 2018-11-30 ENCOUNTER — Other Ambulatory Visit: Payer: PPO

## 2018-12-01 ENCOUNTER — Telehealth: Payer: Self-pay

## 2018-12-01 DIAGNOSIS — Z79899 Other long term (current) drug therapy: Secondary | ICD-10-CM

## 2018-12-01 NOTE — Telephone Encounter (Signed)
-----   Message from Isaiah Serge, NP sent at 11/30/2018  5:29 PM EDT ----- Kidney function slightly elevated, lets change lasix to 20 mg alt with 40 mg every other day unless she has increased SOB or edema. Recheck BMP in 3 weeks.

## 2018-12-01 NOTE — Telephone Encounter (Signed)
Notes recorded by Frederik Schmidt, RN on 12/01/2018 at 8:23 AM EDT  The patient has been notified of the result and verbalized understanding. All questions (if any) were answered.  Frederik Schmidt, RN 12/01/2018 8:23 AM

## 2018-12-17 DIAGNOSIS — Z9010 Acquired absence of unspecified breast and nipple: Secondary | ICD-10-CM | POA: Diagnosis not present

## 2018-12-23 ENCOUNTER — Telehealth: Payer: Self-pay | Admitting: Cardiology

## 2018-12-23 ENCOUNTER — Other Ambulatory Visit: Payer: PPO | Admitting: *Deleted

## 2018-12-23 ENCOUNTER — Other Ambulatory Visit: Payer: Self-pay

## 2018-12-23 DIAGNOSIS — D485 Neoplasm of uncertain behavior of skin: Secondary | ICD-10-CM | POA: Diagnosis not present

## 2018-12-23 DIAGNOSIS — Z79899 Other long term (current) drug therapy: Secondary | ICD-10-CM

## 2018-12-23 DIAGNOSIS — C44319 Basal cell carcinoma of skin of other parts of face: Secondary | ICD-10-CM | POA: Diagnosis not present

## 2018-12-23 LAB — BASIC METABOLIC PANEL
BUN/Creatinine Ratio: 25 (ref 12–28)
BUN: 30 mg/dL — ABNORMAL HIGH (ref 8–27)
CO2: 23 mmol/L (ref 20–29)
Calcium: 9.4 mg/dL (ref 8.7–10.3)
Chloride: 100 mmol/L (ref 96–106)
Creatinine, Ser: 1.2 mg/dL — ABNORMAL HIGH (ref 0.57–1.00)
GFR calc Af Amer: 50 mL/min/{1.73_m2} — ABNORMAL LOW (ref >59)
GFR calc non Af Amer: 43 mL/min/{1.73_m2} — ABNORMAL LOW (ref >59)
Glucose: 114 mg/dL — ABNORMAL HIGH (ref 65–99)
Potassium: 3.9 mmol/L (ref 3.5–5.2)
Sodium: 138 mmol/L (ref 134–144)

## 2018-12-23 NOTE — Telephone Encounter (Signed)
New message   Patient states that she is returning call from Arlester Marker about Eliquis for free medication. Please call the patient.

## 2018-12-24 NOTE — Telephone Encounter (Addendum)
We received a [employer] Pt Asst application via fax on Wednesday 12/22/2018 from Dr Unk Pinto office at [place] with only Kristin Hood provided as the pts identifier. I called and left 2 VM messages at Dr Unk Pinto office asking Dan Europe to call me back as I needed more info to identify the pt but no one returned my call.  The pt is calling to check on her application and I finally realized that the application faxed to [place] from Dan Europe was for this pt.  The pt is upset that this has not been addressed before now. I advised her of the above and that I was off yesterday and called her back as soon as I could this morning.  I did apologize to the pt for the hold up and advised her that we will take care of the provider part of her application and fax it to [employer] today.   She thanked me for calling her, explaining the hold up and for taking care of this today.  She states that she has enough Eliquis on hand to last until 12/29/2018 and is aware to contact me if she starts to run low before this is taken care of.

## 2018-12-24 NOTE — Telephone Encounter (Signed)
We have completed the provider page of the pts BMS pt asst application for Eliquis, Dr Lovena Le (DOD) has signed it and we have faxed it to [employer] Pt Asst Program with a message stating that the pts part has already been sent in (as stated on the fax sent from [facility] at Dr Unk Pinto office and the pt).

## 2018-12-28 ENCOUNTER — Other Ambulatory Visit: Payer: Self-pay

## 2018-12-28 ENCOUNTER — Ambulatory Visit
Admission: RE | Admit: 2018-12-28 | Discharge: 2018-12-28 | Disposition: A | Discharge status: Home / Self Care | Payer: PPO | Source: Ambulatory Visit | Attending: Internal Medicine | Admitting: Internal Medicine

## 2018-12-28 DIAGNOSIS — Z1231 Encounter for screening mammogram for malignant neoplasm of breast: Secondary | ICD-10-CM

## 2018-12-29 DIAGNOSIS — Z853 Personal history of malignant neoplasm of breast: Secondary | ICD-10-CM | POA: Diagnosis not present

## 2018-12-29 DIAGNOSIS — E782 Mixed hyperlipidemia: Secondary | ICD-10-CM | POA: Diagnosis not present

## 2018-12-29 DIAGNOSIS — I2729 Other secondary pulmonary hypertension: Secondary | ICD-10-CM | POA: Diagnosis not present

## 2018-12-29 DIAGNOSIS — J449 Chronic obstructive pulmonary disease, unspecified: Secondary | ICD-10-CM | POA: Diagnosis not present

## 2018-12-29 DIAGNOSIS — I10 Essential (primary) hypertension: Secondary | ICD-10-CM | POA: Diagnosis not present

## 2018-12-29 DIAGNOSIS — I4891 Unspecified atrial fibrillation: Secondary | ICD-10-CM | POA: Diagnosis not present

## 2018-12-29 DIAGNOSIS — J441 Chronic obstructive pulmonary disease with (acute) exacerbation: Secondary | ICD-10-CM | POA: Diagnosis not present

## 2018-12-29 DIAGNOSIS — M19041 Primary osteoarthritis, right hand: Secondary | ICD-10-CM | POA: Diagnosis not present

## 2018-12-29 DIAGNOSIS — I5032 Chronic diastolic (congestive) heart failure: Secondary | ICD-10-CM | POA: Diagnosis not present

## 2018-12-29 DIAGNOSIS — E890 Postprocedural hypothyroidism: Secondary | ICD-10-CM | POA: Diagnosis not present

## 2018-12-29 DIAGNOSIS — I509 Heart failure, unspecified: Secondary | ICD-10-CM | POA: Diagnosis not present

## 2018-12-30 NOTE — Telephone Encounter (Signed)
Letter received from [employer] stating that they have denied pt asst for Eliquis to the pt. Reason given on cover letter: 2020 out of pocket requirement not met.  Reason given in the letter:  Medication is covered by the pts Ins plan.  The letter states that they have notified the pt of this denial as well.

## 2019-01-03 ENCOUNTER — Telehealth: Payer: Self-pay

## 2019-01-03 NOTE — Telephone Encounter (Addendum)
**Note De-Identified Laylanie Kruczek Obfuscation** Call received from [facility] with Dr Eddie Dibbles office.  She states that the pt has advised her that she was denied but is now approved for pt asst with [employer] for her Eliquis.  I advised her that we also received a denial letter on 12/30/2018 and then a approval letter on 01/03/2019.   Dan Europe states that [employer] will not give her any information concerning when the pt will be receiving her Eliquis by mail and is concerned that she will run out before this is handled.  She states that they (Dr Eddie Dibbles office) sent the pt 3 days worth of Eliquis this morning and is concerned that she will run out before she received a shipment from BMS.  I advised Dan Europe that I will call [employer] to find out how quick the pt will receive her Eliquis and I will call the pt to offer her 2 weeks of Eliquis samples to be sure she has enough until hers comes in the mail.   Dan Europe expressed much appreciation for my help.

## 2019-01-03 NOTE — Telephone Encounter (Signed)
I called [employer] and s/w Stacy who advised me that the pt needs to call [facility] (Pharmacy for [facility] Pt Asst Program) at [phone] to update her mailing and contact info.  I called the pt and made her aware of this and I offered to call [facility] for her and she accepted my offer.  I called [facility] and s/w Elpidio Eric who updated the pts contact and mailing address in their system. Elpidio Eric also advised me that the pts Eliquis was shipped out to the pts address this morning with an expected delivery date by 9 pm tonight.  I called the pt and made her aware. She expressed a lot of happiness and states "I can stop stressing about this now".  She is advised that we will leave the 2 bottles of Eliquis samples downstairs for her to pick up incase hers does not arrive tonight and that if it does arrive not to worry about driving to the office to pick up the samples as we will know why she didn't pick them up.   She verbalized understanding to all info given and thanked me for helping her.

## 2019-01-03 NOTE — Telephone Encounter (Signed)
**Note De-Identified Hamilton Marinello Obfuscation** We received a second letter from [employer] Pt Asst Program and this one states that they have approved the pt for asst with her Eliquis. Approval good until 03/31/2019. Application Case# [id number]  The letter states that they have notified the pt of this approval as well.

## 2019-01-05 DIAGNOSIS — H9201 Otalgia, right ear: Secondary | ICD-10-CM | POA: Diagnosis not present

## 2019-01-05 DIAGNOSIS — G4700 Insomnia, unspecified: Secondary | ICD-10-CM | POA: Diagnosis not present

## 2019-01-05 DIAGNOSIS — M5416 Radiculopathy, lumbar region: Secondary | ICD-10-CM | POA: Diagnosis not present

## 2019-01-05 DIAGNOSIS — Z853 Personal history of malignant neoplasm of breast: Secondary | ICD-10-CM | POA: Diagnosis not present

## 2019-01-05 DIAGNOSIS — Z23 Encounter for immunization: Secondary | ICD-10-CM | POA: Diagnosis not present

## 2019-01-19 DIAGNOSIS — I4891 Unspecified atrial fibrillation: Secondary | ICD-10-CM | POA: Diagnosis not present

## 2019-01-19 DIAGNOSIS — I5032 Chronic diastolic (congestive) heart failure: Secondary | ICD-10-CM | POA: Diagnosis not present

## 2019-01-19 DIAGNOSIS — E890 Postprocedural hypothyroidism: Secondary | ICD-10-CM | POA: Diagnosis not present

## 2019-01-19 DIAGNOSIS — E782 Mixed hyperlipidemia: Secondary | ICD-10-CM | POA: Diagnosis not present

## 2019-01-19 DIAGNOSIS — Z853 Personal history of malignant neoplasm of breast: Secondary | ICD-10-CM | POA: Diagnosis not present

## 2019-01-19 DIAGNOSIS — M19041 Primary osteoarthritis, right hand: Secondary | ICD-10-CM | POA: Diagnosis not present

## 2019-01-19 DIAGNOSIS — I10 Essential (primary) hypertension: Secondary | ICD-10-CM | POA: Diagnosis not present

## 2019-01-19 DIAGNOSIS — J449 Chronic obstructive pulmonary disease, unspecified: Secondary | ICD-10-CM | POA: Diagnosis not present

## 2019-01-19 DIAGNOSIS — J441 Chronic obstructive pulmonary disease with (acute) exacerbation: Secondary | ICD-10-CM | POA: Diagnosis not present

## 2019-01-19 DIAGNOSIS — I509 Heart failure, unspecified: Secondary | ICD-10-CM | POA: Diagnosis not present

## 2019-02-10 ENCOUNTER — Other Ambulatory Visit: Payer: Self-pay

## 2019-02-10 ENCOUNTER — Ambulatory Visit: Payer: PPO | Admitting: Cardiology

## 2019-02-10 ENCOUNTER — Encounter: Payer: Self-pay | Admitting: Cardiology

## 2019-02-10 VITALS — BP 130/70 | HR 78 | Ht 66.0 in | Wt 202.4 lb

## 2019-02-10 DIAGNOSIS — I4821 Permanent atrial fibrillation: Secondary | ICD-10-CM

## 2019-02-10 DIAGNOSIS — I5032 Chronic diastolic (congestive) heart failure: Secondary | ICD-10-CM

## 2019-02-10 DIAGNOSIS — I10 Essential (primary) hypertension: Secondary | ICD-10-CM

## 2019-02-10 DIAGNOSIS — I272 Pulmonary hypertension, unspecified: Secondary | ICD-10-CM

## 2019-02-10 NOTE — Progress Notes (Signed)
Patient ID: Kristin Hood, female   DOB: 1940-08-25, 78 y.o.   MRN: [id number]      [address], [address] [place], [place]  [address] Phone: [phone] Fax:  [phone]  Date:  02/10/2019   ID:  Kristin Hood, DOB [birth year], MRN [id number]  PCP:  Kristin Fess, MD   History of Present Illness: Kristin Hood is a 78 y.o. female with atrial fibrillation discovered on 08/20/12, chronic anticoagulation, COPD, prior stroke, received TPA for cerebral artery occlusion (could not raise arm-8/13) here for followup. Lives next door to [facility] and [employer] [facility]    Echocardiogram demonstrated mild to moderate left atrial enlargement, normal ejection fraction, mild aortic stenosis, MAC, mild mitral regurgitation.  Had 3 knee replacements.   She underwent catheterization on 08/14/11 which showed no evidence of coronary artery disease. LVEDP was 14 mm mercury.  Went to ER on 12/10/12 had a terrific pain in her chest. Lasted about hour at home BP 159/100, 3 times. Went on to ER. Was worried. Once she got to [facility] she quit hurting. Sent to ER. Wanted to keep her overnight. Did troponin did well. Did OK. Prob GERD.   Went again to [facility]. Afib increased rate. PET scan. 3 nodules one thyroid. Endocrine, no good sample.   Unfortunately, her son-in-law died 2 weeks after neck surgery from pulmonary embolism.  Aug 22, 2014 - Hip surgery. Dr. Wynelle Hood. She has had extensive workup including oncology workup for nodules in her lungs, lymph nodes, thyroid nodule. She will be followed up on a yearly basis and oncology. Reassurance. Overall, no chest pain other than mild GERD at night after dinner. No syncope, no change in shortness of breath.  05/16/15-baseline shortness of breath. She did go to the emergency room because of centralized chest discomfort. Troponin was normal, EKG unremarkable. Thankfully she started taking Zantac and this was improved.  11/12/15-reassuring thyroid tissue upon excision.  No cancer. Shortness of breath continues. Baseline. On Symbicort. COPD. Eliquis has been expensive. Trying to get assistance.  Aug 21, 2016 - AFIB, Kristin Hood) died. Needs toe repositioned. Right varicose vein. No significant shortness of breath. Occasionally she will still feel palpitations in the evening hours, around 4 PM. She has taken an extra Inderal at that time which seems to help area she recounted a story where she felt it racing, her grandson asked her if she wanted to go to dinner and she forgot about it and did not have any problems. Once again Kristin Hood and Kristin Hood are her neighbor.  05/21/17 - 12/30/16 hospitalization. Combination from heart and lungs. Weight set at 240 when left the hospital.  Now her base weight is 235.  She understands to take an extra Lasix if necessary.  She has been doing a good job on maintaining this weight.  She still is quite deconditioned, debilitated.  Her back and hip pain is playing a major role in this.  She is not having any significant chest discomfort.  Occasionally she will feel heart palpitations from her atrial fibrillation which is permanent.  She has not had any bleeding episodes.  When discussing her orthopedic issues, she understands that she would be high risk for any surgical procedures.  Whether this pertains to her toe or her back she understands.  Creat 1.1. Kristin Hood. Back. Prior stroke   06/04/2018- here for the follow-up of recent hospitalization with acute on chronic diastolic heart failure and hypoxic respiratory failure.  She was diuresed several liters, felt  better.  Lasix was increased to 40 mg a day from 20.  She is maintaining her weight.  Atrial fibrillation stable.  Please see below for details.  Hospitalization records reviewed.  02/10/2019-here for the follow-up of chronic diastolic heart failure.  Prior hospitalization earlier in 2020 with hypoxic respiratory failure and a component of diastolic heart failure.  Her Lasix was increased to 40  mg from 20.  Atrial fibrillation was stable. Since using the nebulizer a day this has helped. This was given at prior hospital stay. Weather changes cause episodes.  Overall his weight is down, stable.  Last creatinine has been in the 1.2-1.4 range.  Stable.  Hemoglobin 10.8 previously.  Wt Readings from Last 3 Encounters:  02/10/19 202 lb 6.4 oz (91.8 kg)  11/16/18 210 lb (95.3 kg)  11/11/18 210 lb 12.8 oz (95.6 kg)     Past Medical History:  Diagnosis Date  . Acute congestive heart failure ([place])   . Acute diastolic CHF (congestive heart failure) ([place]) 12/30/2016  . Acute respiratory failure with hypoxemia ([place]) 06/09/2016  . Acute respiratory failure with hypoxia ([place]) 12/30/2016  . Anginal pain ([place])   . ARF (acute renal failure) ([place]) 06/11/2016  . Arthritis   . Atrial fibrillation ([place]) 07/2012   Eliquis, rate controlled  . Benign hypertensive heart disease without heart failure 12/25/2012  . Breast cancer ([place]) 2001   left mastectomy  . Chest pain 12/11/2012  . CHF (congestive heart failure) ([place]) 06/10/2016  . Chronic atrial fibrillation ([place]) 11/16/2014  . Chronic diastolic heart failure ([place]) 02/13/2014  . Coarse tremors    , Essential  . Complication of anesthesia    SMALLER TUBE FOR INTUBATION AS GAGS ON TUBE  . COPD (chronic obstructive pulmonary disease) ([place])   . COPD exacerbation ([place]) 12/30/2016  . CVA (cerebral vascular accident) ([place]) 2013  . Dysrhythmia 08/20/2012   Atrial Fibrillation  . Essential and other specified forms of tremor 06/18/2012  . Essential hypertension 06/18/2012  . Family history of adverse reaction to anesthesia    mother experienced pain with intubation   . GERD (gastroesophageal reflux disease)    history of   . Hard of hearing   . Heart murmur   . History of chemotherapy 11/13/1998 to 2010   Adriamycin/Docotaxol, Dexorubicin/Taxotere, Femara, Tamoxifen  . History of CVA (cerebrovascular accident) 12/11/2012  . History of hiatal hernia     history of   . Hx of adenomatous colonic polyps 2006   , Benign  . Hypercholesteremia   . Hyperlipidemia 02/13/2014  . Hypertension    , With mild concentric left ventricular hypertrophy  . Long term current use of anticoagulant therapy 12/25/2012  . Mixed dyslipidemia   . Mixed hyperlipidemia 12/25/2012  . Morbid obesity ([place]) 06/18/2012  . Phlebitis and thrombophlebitis of superficial vessels of lower extremities 12/25/2012  . 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 Pure hypercholesterolemia 12/25/2012  . Shingles   . Shortness of breath dyspnea    on exertion   . SOB (shortness of breath) 11/16/2014  . Spider veins   . Stroke ([place]) 11/17/2011   left side brain-speech-TPA  . Thyroid nodule   . Tremor, essential   . Unspecified cerebral artery occlusion with cerebral infarction 11/18/2011  . Valvular sclerosis 09/23/2003   without stenosis  . Varicose veins     Past Surgical History:  Procedure Laterality Date  . APPENDECTOMY  07/10/1957  . BIOPSY THYROID  12/01/2013   ultrasound  . BREAST SURGERY    .  BUNIONECTOMY  11/29/1988   bilateral with hammer toes  . CARDIAC CATHETERIZATION  07/2010   , Without significant CAD  . CARDIAC CATHETERIZATION  07/2011   [name]  . CESAREAN SECTION    . CESAREAN SECTION  05/1968, 07/1965  . EYE SURGERY  01/31/2013   eye lid; cataract surgery bilat   . JOINT REPLACEMENT  05/2001   left knee  . JOINT REPLACEMENT  11/2001   right knee  . JOINT REPLACEMENT  01/2008   redo right knee  . KNEE SURGERY  2009   ,Total knee replacement  . LEFT HEART CATHETERIZATION WITH CORONARY ANGIOGRAM N/A 08/14/2011   Procedure: LEFT HEART CATHETERIZATION WITH CORONARY ANGIOGRAM;  Surgeon: [name], MD;  Location: [facility] CATH LAB;  Service: Cardiovascular;  Laterality: N/A;  . Left Rotator Cuff    . MASTECTOMY Left 10/19/1998   Radical, left -with lymph nodes (8 total)  . Reverse bunionectomy  08/29/1993   removal of Tibial Sesamoid-left  . TEE  WITHOUT CARDIOVERSION  11/19/2011   Procedure: TRANSESOPHAGEAL ECHOCARDIOGRAM (TEE);  Surgeon: [name], MD;  Location: [facility] ENDOSCOPY;  Service: Cardiovascular;  Laterality: N/A;  . THYROIDECTOMY N/A 03/02/2015   Procedure: TOTAL THYROIDECTOMY;  Surgeon: Armandina Gemma, MD;  Location: [facility] ORS;  Service: General;  Laterality: N/A;  . TONSILLECTOMY    . TOTAL HIP ARTHROPLASTY Left 10/11/2014   Procedure: LEFT TOTAL HIP ARTHROPLASTY ANTERIOR APPROACH;  Surgeon: [name], MD;  Location: [facility] ORS;  Service: Orthopedics;  Laterality: Left;    Current Outpatient Medications  Medication Sig Dispense Refill  . albuterol (PROVENTIL HFA;VENTOLIN HFA) 108 (90 Base) MCG/ACT inhaler Inhale 2 puffs into the lungs every 4 (four) hours as needed for wheezing or shortness of breath. 1 Inhaler 0  . atorvastatin (LIPITOR) 20 MG tablet Take 20 mg by mouth at bedtime.     . budesonide (PULMICORT) 0.25 MG/2ML nebulizer solution Take 2 mLs (0.25 mg total) by nebulization 2 (two) times daily. 60 mL 12  . ELIQUIS 5 MG TABS tablet TAKE 1 TABLET BY MOUTH TWICE A DAY 180 tablet 2  . fenofibrate 160 MG tablet Take 160 mg by mouth at bedtime.     . fluticasone (FLONASE) 50 MCG/ACT nasal spray Place 2 sprays into both nostrils daily as needed for allergies or rhinitis.     . furosemide (LASIX) 40 MG tablet Take 1 tablet by mouth alternating with 1/2 tablet by mouth qod 90 tablet 3  . guaiFENesin (MUCINEX) 600 MG 12 hr tablet Take 600 mg by mouth daily.    Marland Kitchen levothyroxine (SYNTHROID) 175 MCG tablet TAKE 1 TABLET BY MOUTH EVERY DAY BEFORE BREAKFAST 90 tablet 3  . loratadine (CLARITIN) 10 MG tablet Take 10 mg by mouth daily as needed for allergies (rash).     . Multiple Vitamin (MULTIVITAMIN WITH MINERALS) TABS tablet Take 1 tablet by mouth daily.     . polyvinyl alcohol (ARTIFICIAL TEARS) 1.4 % ophthalmic solution Place 1 drop into both eyes daily as needed for dry eyes.    Marland Kitchen propranolol ER (INDERAL LA) 60 MG 24 hr capsule TAKE  2 CAPS IN THE MORNING TAKE 1 CAP IN THE AFTERNOON, 1 CAP AT NIGHT (Patient taking differently: TAKE 2 CAPS IN THE MORNING and 1 CAP AT NIGHT) 360 capsule 3  . Tiotropium Bromide Monohydrate (SPIRIVA RESPIMAT) 2.5 MCG/ACT AERS Inhale 2 puffs into the lungs daily. 3 Inhaler 4   No current facility-administered medications for this visit.     Allergies:  Allergies  Allergen Reactions  . Floxin [Ofloxacin] Nausea Only and Other (See Comments)    Dizziness, Vertigo     Social History:  The patient  reports that she quit smoking about 27 years ago. Her smoking use included cigarettes. She has a 60.00 pack-year smoking history. She has never used smokeless tobacco. She reports current alcohol use of about 1.0 standard drinks of alcohol per week. She reports that she does not use drugs.   ROS: All other review of systems negative.  Denies any fever chills nausea vomiting syncope  PHYSICAL EXAM: VS:  BP 130/70   Pulse 78   Ht _0  (1.676 m)   Wt 202 lb 6.4 oz (91.8 kg)   SpO2 93%   BMI 32.67 kg/m  GEN: Well nourished, well developed, in no acute distress  HEENT: normal  Neck: no JVD, carotid bruits, or masses Cardiac: RRR; 2/6 SM, no rubs, or gallops,no edema  Respiratory:  clear to auscultation bilaterally, normal work of breathing GI: soft, nontender, nondistended, + BS MS: no deformity or atrophy  Skin: warm and dry, no rash Neuro:  Alert and Oriented x 3, Strength and sensation are intact Psych: euthymic mood, full affect  EKG: No new ECG. ECG from 12/2016 personally reviewed and AFIB 90. 07/31/14-76, atrial fibrillation-no significant change from prior. AFIB 77 No ST changes. Personally viewed  Echocardiogram 10/27/2018:  1. The left ventricle has normal systolic function with an ejection fraction of 60-65%. The cavity size was normal. Left ventricular diastolic function could not be evaluated secondary to atrial fibrillation. Elevated left ventricular end-diastolic  pressure.   2. The right ventricle has normal systolic function. The cavity was normal. There is no increase in right ventricular wall thickness. Right ventricular systolic pressure is moderately elevated.  3. Left atrial size was severely dilated.  4. Right atrial size was severely dilated.  5. The aortic valve is tricuspid. Moderate thickening of the aortic valve. Moderate calcification of the aortic valve. Aortic valve regurgitation is mild by color flow Doppler. Mild stenosis of the aortic valve. Moderate aortic annular calcification  noted.  6. The mitral valve is degenerative. Mild thickening of the mitral valve leaflet. Mild calcification of the mitral valve leaflet. There is moderate to severe mitral annular calcification present. Mild mitral valve stenosis.  7. The aorta is normal in size and structure.  8. The inferior vena cava was dilated in size with <50% respiratory variability.  9. The interatrial septum appears to be lipomatous.   ECHO 06/10/16:  - Left ventricle: The cavity size was normal. Wall thickness was   normal. Systolic function was normal. The estimated ejection   fraction was in the range of 55% to 60%. Wall motion was normal;   there were no regional wall motion abnormalities. - Aortic valve: There was mild stenosis. There was trivial   regurgitation. - Mitral valve: Moderately calcified annulus. There was mild   regurgitation. - Left atrium: The atrium was severely dilated. - Right atrium: The atrium was moderately dilated. - Pulmonary arteries: Systolic pressure was mildly increased. PA   peak pressure: 46 mm Hg (S).  Impressions:  - Normal LV systolic function; biatrial enlargement; calcified   aortic valve with mild AS with mean gradient 10 mmHg and trace   AI; mild MR; mild TR with mildly elevated pulmonary pressure.  ASSESSMENT AND PLAN:  Permanent atrial fibrillation  - Currently well rate controlled. No change in Inderal.  This was previously decreased  which is thought  to help her with her breathing at a prior hospitalization.  - Occasionally will feel increased heart rate in the evening hours, late afternoon. She takes an extra Inderal if this happens.  No changes made.  Stable.  History of stroke  - Increases her future stroke risk. Continue with chronic anticoagulation. Eliquis. No new symptoms.  Continue with current plan.  No bleeding.  Doing well.  Non-Hodgkin's lymphoma -Surveillance by Dr. [name].  Chronic anticoagulation  - She has not had any bleeding issues. Continue with Eliquis.  - Monitor basic metabolic profile and CBC. . No bruising.   GERD  - Prior ER visit in January 2017. Reassurance. Prior cath reassuring.  Aortic atherosclerosis  - statin, HTN control.  No changes continue with these efforts.  Obesity, morbid (BMI >35 with 2 or more comorbidities)  - Continue to encourage weight loss.  Has done a good job with this.  Chronic diastolic heart failure  - She is watching her daily weights. They have been stable Since recent hospitalization 05/2018.  In fact they were lower.  She is taking Lasix now 40 mg increased from 20 mg a day. Continue with medications as above.  I think she only needs 10 mEq of potassium since her prior check was 4.7. Continue to encourage exercise, movement.  She actually has felt better with the nebulizer treatments.  Trying to avoid ER visits.  Secondary pulmonary hypertension  - from morbid obesity, Noted on ECHO. Tx HTN.  No changes  Varicose veins  - She has seen Dr. Oneida Alar in the past.  No changes made.  Feet can be cold at times.  I was able to palpate a posterior tibial pulse.  Essential hypertension  - Doing well.  Continues to be well controlled.  At prior visit was grieving the loss of her husband, Clair Gulling   - She is very appreciative of Melina Copa and Thurmond Butts her neighbor who helped significantly. Still difficult for her at times.   Six-month follow-up with APP, 12 months with me   Signed, [name], MD [facility]  02/10/2019 11:18 AM

## 2019-02-10 NOTE — Patient Instructions (Signed)
Medication Instructions:  The current medical regimen is effective;  continue present plan and medications.  *If you need a refill on your cardiac medications before your next appointment, please call your pharmacy*  Follow-Up: At [facility], you and your health needs are our priority.  As part of our continuing mission to provide you with exceptional heart care, we have created designated Provider Care Teams.  These Care Teams include your primary Cardiologist (physician) and Advanced Practice Providers (APPs -  Physician Assistants and Nurse Practitioners) who all work together to provide you with the care you need, when you need it.  Your next appointment:   6 months  The format for your next appointment:   In Person  Provider:   Kathyrn Drown, NP and with Dr [name] in 1 year.  Thank you for choosing [facility]!!

## 2019-02-15 DIAGNOSIS — I5032 Chronic diastolic (congestive) heart failure: Secondary | ICD-10-CM | POA: Diagnosis not present

## 2019-02-15 DIAGNOSIS — I4891 Unspecified atrial fibrillation: Secondary | ICD-10-CM | POA: Diagnosis not present

## 2019-02-15 DIAGNOSIS — E890 Postprocedural hypothyroidism: Secondary | ICD-10-CM | POA: Diagnosis not present

## 2019-02-15 DIAGNOSIS — J449 Chronic obstructive pulmonary disease, unspecified: Secondary | ICD-10-CM | POA: Diagnosis not present

## 2019-02-15 DIAGNOSIS — E782 Mixed hyperlipidemia: Secondary | ICD-10-CM | POA: Diagnosis not present

## 2019-02-15 DIAGNOSIS — M19041 Primary osteoarthritis, right hand: Secondary | ICD-10-CM | POA: Diagnosis not present

## 2019-02-15 DIAGNOSIS — J441 Chronic obstructive pulmonary disease with (acute) exacerbation: Secondary | ICD-10-CM | POA: Diagnosis not present

## 2019-02-15 DIAGNOSIS — I509 Heart failure, unspecified: Secondary | ICD-10-CM | POA: Diagnosis not present

## 2019-02-15 DIAGNOSIS — Z853 Personal history of malignant neoplasm of breast: Secondary | ICD-10-CM | POA: Diagnosis not present

## 2019-02-15 DIAGNOSIS — I10 Essential (primary) hypertension: Secondary | ICD-10-CM | POA: Diagnosis not present

## 2019-02-22 ENCOUNTER — Inpatient Hospital Stay: Payer: PPO | Attending: Internal Medicine | Admitting: Internal Medicine

## 2019-02-22 ENCOUNTER — Encounter: Payer: Self-pay | Admitting: Internal Medicine

## 2019-02-22 ENCOUNTER — Inpatient Hospital Stay: Payer: PPO

## 2019-02-22 ENCOUNTER — Other Ambulatory Visit: Payer: Self-pay

## 2019-02-22 VITALS — BP 139/86 | HR 83 | Temp 98.5°F | Resp 17 | Ht 66.0 in | Wt 202.8 lb

## 2019-02-22 DIAGNOSIS — C8580 Other specified types of non-Hodgkin lymphoma, unspecified site: Secondary | ICD-10-CM | POA: Diagnosis not present

## 2019-02-22 DIAGNOSIS — I482 Chronic atrial fibrillation, unspecified: Secondary | ICD-10-CM | POA: Diagnosis not present

## 2019-02-22 DIAGNOSIS — I110 Hypertensive heart disease with heart failure: Secondary | ICD-10-CM | POA: Insufficient documentation

## 2019-02-22 DIAGNOSIS — Z9012 Acquired absence of left breast and nipple: Secondary | ICD-10-CM | POA: Insufficient documentation

## 2019-02-22 DIAGNOSIS — D649 Anemia, unspecified: Secondary | ICD-10-CM | POA: Diagnosis not present

## 2019-02-22 DIAGNOSIS — Z79899 Other long term (current) drug therapy: Secondary | ICD-10-CM | POA: Diagnosis not present

## 2019-02-22 DIAGNOSIS — Z853 Personal history of malignant neoplasm of breast: Secondary | ICD-10-CM | POA: Diagnosis not present

## 2019-02-22 DIAGNOSIS — I10 Essential (primary) hypertension: Secondary | ICD-10-CM | POA: Diagnosis not present

## 2019-02-22 DIAGNOSIS — Z8673 Personal history of transient ischemic attack (TIA), and cerebral infarction without residual deficits: Secondary | ICD-10-CM | POA: Diagnosis not present

## 2019-02-22 DIAGNOSIS — Z7901 Long term (current) use of anticoagulants: Secondary | ICD-10-CM | POA: Diagnosis not present

## 2019-02-22 DIAGNOSIS — D72819 Decreased white blood cell count, unspecified: Secondary | ICD-10-CM | POA: Insufficient documentation

## 2019-02-22 LAB — CMP (CANCER CENTER ONLY)
ALT: 15 U/L (ref 0–44)
AST: 28 U/L (ref 15–41)
Albumin: 3.3 g/dL — ABNORMAL LOW (ref 3.5–5.0)
Alkaline Phosphatase: 34 U/L — ABNORMAL LOW (ref 38–126)
Anion gap: 8 (ref 5–15)
BUN: 27 mg/dL — ABNORMAL HIGH (ref 8–23)
CO2: 29 mmol/L (ref 22–32)
Calcium: 9 mg/dL (ref 8.9–10.3)
Chloride: 103 mmol/L (ref 98–111)
Creatinine: 1.12 mg/dL — ABNORMAL HIGH (ref 0.44–1.00)
GFR, Est AFR Am: 54 mL/min — ABNORMAL LOW (ref >60)
GFR, Estimated: 47 mL/min — ABNORMAL LOW (ref >60)
Glucose, Bld: 106 mg/dL — ABNORMAL HIGH (ref 70–99)
Potassium: 4.1 mmol/L (ref 3.5–5.1)
Sodium: 140 mmol/L (ref 135–145)
Total Bilirubin: 1 mg/dL (ref 0.3–1.2)
Total Protein: 7.8 g/dL (ref 6.5–8.1)

## 2019-02-22 LAB — CBC WITH DIFFERENTIAL (CANCER CENTER ONLY)
Abs Immature Granulocytes: 0.02 10*3/uL (ref 0.00–0.07)
Basophils Absolute: 0 10*3/uL (ref 0.0–0.1)
Basophils Relative: 0 %
Eosinophils Absolute: 0 10*3/uL (ref 0.0–0.5)
Eosinophils Relative: 1 %
HCT: 35.7 % — ABNORMAL LOW (ref 36.0–46.0)
Hemoglobin: 11.1 g/dL — ABNORMAL LOW (ref 12.0–15.0)
Immature Granulocytes: 1 %
Lymphocytes Relative: 12 %
Lymphs Abs: 0.4 10*3/uL — ABNORMAL LOW (ref 0.7–4.0)
MCH: 29 pg (ref 26.0–34.0)
MCHC: 31.1 g/dL (ref 30.0–36.0)
MCV: 93.2 fL (ref 80.0–100.0)
Monocytes Absolute: 0.5 10*3/uL (ref 0.1–1.0)
Monocytes Relative: 14 %
Neutro Abs: 2.5 10*3/uL (ref 1.7–7.7)
Neutrophils Relative %: 72 %
Platelet Count: 260 10*3/uL (ref 150–400)
RBC: 3.83 MIL/uL — ABNORMAL LOW (ref 3.87–5.11)
RDW: 15.1 % (ref 11.5–15.5)
WBC Count: 3.5 10*3/uL — ABNORMAL LOW (ref 4.0–10.5)
nRBC: 0 % (ref 0.0–0.2)

## 2019-02-22 LAB — LACTATE DEHYDROGENASE: LDH: 165 U/L (ref 98–192)

## 2019-02-22 NOTE — Progress Notes (Signed)
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  PRIOR THERAPY: None.  CURRENT THERAPY: Observation.  INTERVAL HISTORY: Kristin Hood 78 y.o. female returns to the clinic today for follow-up visit.  The patient is feeling fine today with no concerning complaints except for the baseline shortness of breath increased with exertion.  She was started recently on inhalers during hospitalization and she felt much better with the Spiriva and Pulmicort.  She denied having any current chest pain, cough or hemoptysis.  She denied having any recent weight loss or night sweats.  She has no nausea, vomiting, diarrhea or constipation.  She is currently on Lasix for diuresis because of her history of congestive heart failure.  She is here today for evaluation and repeat blood work.  MEDICAL HISTORY: Past Medical History:  Diagnosis Date  . Acute congestive heart failure ([place])   . Acute diastolic CHF (congestive heart failure) ([place]) 12/30/2016  . Acute respiratory failure with hypoxemia ([place]) 06/09/2016  . Acute respiratory failure with hypoxia ([place]) 12/30/2016  . Anginal pain ([place])   . ARF (acute renal failure) ([place]) 06/11/2016  . Arthritis   . Atrial fibrillation ([place]) 07/2012   Eliquis, rate controlled  . Benign hypertensive heart disease without heart failure 12/25/2012  . Breast cancer ([place]) 2001   left mastectomy  . Chest pain 12/11/2012  . CHF (congestive heart failure) ([place]) 06/10/2016  . Chronic atrial fibrillation ([place]) 11/16/2014   . Chronic diastolic heart failure ([place]) 02/13/2014  . Coarse tremors    , Essential  . Complication of anesthesia    SMALLER TUBE FOR INTUBATION AS GAGS ON TUBE  . COPD (chronic obstructive pulmonary disease) ([place])   . COPD exacerbation ([place]) 12/30/2016  . CVA (cerebral vascular accident) ([place]) 2013  . Dysrhythmia 08/20/2012   Atrial Fibrillation  . Essential and other specified forms of tremor 06/18/2012  . Essential hypertension 06/18/2012  . Family history of adverse reaction to anesthesia    mother experienced pain with intubation   . GERD (gastroesophageal reflux disease)    history of   . Hard of hearing   . Heart murmur   . History of chemotherapy 11/13/1998 to 2010   Adriamycin/Docotaxol, Dexorubicin/Taxotere, Femara, Tamoxifen  . History of CVA (cerebrovascular accident) 12/11/2012  . History of hiatal hernia    history of   . Hx of adenomatous colonic polyps 2006   , Benign  . Hypercholesteremia   . Hyperlipidemia 02/13/2014  . Hypertension    , With mild concentric left ventricular hypertrophy  . Long term current use of anticoagulant therapy 12/25/2012  . Mixed dyslipidemia   . Mixed hyperlipidemia 12/25/2012  . Morbid obesity ([place]) 06/18/2012  . Phlebitis and thrombophlebitis of superficial vessels of lower extremities 12/25/2012  . 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 Pure hypercholesterolemia 12/25/2012  . Shingles   . Shortness of breath dyspnea    on exertion   . SOB (shortness of breath) 11/16/2014  . Spider veins   . Stroke ([place]) 11/17/2011   left side brain-speech-TPA  . Thyroid nodule   . Tremor, essential   .  Unspecified cerebral artery occlusion with cerebral infarction 11/18/2011  . Valvular sclerosis 09/23/2003   without stenosis  . Varicose veins     ALLERGIES:  is allergic to floxin [ofloxacin].  MEDICATIONS:  Current Outpatient Medications  Medication Sig Dispense Refill  . albuterol (PROVENTIL HFA;VENTOLIN HFA) 108 (90 Base) MCG/ACT  inhaler Inhale 2 puffs into the lungs every 4 (four) hours as needed for wheezing or shortness of breath. 1 Inhaler 0  . atorvastatin (LIPITOR) 20 MG tablet Take 20 mg by mouth at bedtime.     . budesonide (PULMICORT) 0.25 MG/2ML nebulizer solution Take 2 mLs (0.25 mg total) by nebulization 2 (two) times daily. 60 mL 12  . ELIQUIS 5 MG TABS tablet TAKE 1 TABLET BY MOUTH TWICE A DAY 180 tablet 2  . fenofibrate 160 MG tablet Take 160 mg by mouth at bedtime.     . fluticasone (FLONASE) 50 MCG/ACT nasal spray Place 2 sprays into both nostrils daily as needed for allergies or rhinitis.     . furosemide (LASIX) 40 MG tablet Take 1 tablet by mouth alternating with 1/2 tablet by mouth qod 90 tablet 3  . guaiFENesin (MUCINEX) 600 MG 12 hr tablet Take 600 mg by mouth daily.    Marland Kitchen levothyroxine (SYNTHROID) 175 MCG tablet TAKE 1 TABLET BY MOUTH EVERY DAY BEFORE BREAKFAST 90 tablet 3  . loratadine (CLARITIN) 10 MG tablet Take 10 mg by mouth daily as needed for allergies (rash).     . Multiple Vitamin (MULTIVITAMIN WITH MINERALS) TABS tablet Take 1 tablet by mouth daily.     . polyvinyl alcohol (ARTIFICIAL TEARS) 1.4 % ophthalmic solution Place 1 drop into both eyes daily as needed for dry eyes.    Marland Kitchen propranolol ER (INDERAL LA) 60 MG 24 hr capsule TAKE 2 CAPS IN THE MORNING TAKE 1 CAP IN THE AFTERNOON, 1 CAP AT NIGHT (Patient taking differently: TAKE 2 CAPS IN THE MORNING and 1 CAP AT NIGHT) 360 capsule 3  . Tiotropium Bromide Monohydrate (SPIRIVA RESPIMAT) 2.5 MCG/ACT AERS Inhale 2 puffs into the lungs daily. 3 Inhaler 4   No current facility-administered medications for this visit.     SURGICAL HISTORY:  Past Surgical History:  Procedure Laterality Date  . APPENDECTOMY  07/10/1957  . BIOPSY THYROID  12/01/2013   ultrasound  . BREAST SURGERY    . BUNIONECTOMY  11/29/1988   bilateral with hammer toes  . CARDIAC CATHETERIZATION  07/2010   , Without significant CAD  . CARDIAC CATHETERIZATION  07/2011    [name]  . CESAREAN SECTION    . CESAREAN SECTION  05/1968, 07/1965  . EYE SURGERY  01/31/2013   eye lid; cataract surgery bilat   . JOINT REPLACEMENT  05/2001   left knee  . JOINT REPLACEMENT  11/2001   right knee  . JOINT REPLACEMENT  01/2008   redo right knee  . KNEE SURGERY  2009   ,Total knee replacement  . LEFT HEART CATHETERIZATION WITH CORONARY ANGIOGRAM N/A 08/14/2011   Procedure: LEFT HEART CATHETERIZATION WITH CORONARY ANGIOGRAM;  Surgeon: [name], MD;  Location[facility] CATH LAB;  Service: Cardiovascular;  Laterality: N/A;  . Left Rotator Cuff    . MASTECTOMY Left 10/19/1998   Radical, left -with lymph nodes (8 total)  . Reverse bunionectomy  08/29/1993   removal of Tibial Sesamoid-left  . TEE WITHOUT CARDIOVERSION  11/19/2011   Procedure: TRANSESOPHAGEAL ECHOCARDIOGRAM (TEE);  Surgeon: [name], MD;  Location: [facility];  Service: Cardiovascular;  Laterality:  N/A;  . THYROIDECTOMY N/A 03/02/2015   Procedure: TOTAL THYROIDECTOMY;  Surgeon: Armandina Gemma, MD;  Location: [facility];  Service: General;  Laterality: N/A;  . TONSILLECTOMY    . TOTAL HIP ARTHROPLASTY Left 10/11/2014   Procedure: LEFT TOTAL HIP ARTHROPLASTY ANTERIOR APPROACH;  Surgeon: [name], MD;  Location: [facility];  Service: Orthopedics;  Laterality: Left;    REVIEW OF SYSTEMS:  A comprehensive review of systems was negative except for: Constitutional: positive for fatigue Respiratory: positive for dyspnea on exertion   PHYSICAL EXAMINATION: General appearance: alert, cooperative, fatigued and no distress Head: Normocephalic, without obvious abnormality, atraumatic Neck: no adenopathy, no JVD, supple, symmetrical, trachea midline and thyroid not enlarged, symmetric, no tenderness/mass/nodules Lymph nodes: Cervical, supraclavicular, and axillary nodes normal. Resp: clear to auscultation bilaterally Back: symmetric, no curvature. ROM normal. No CVA tenderness. Cardio: regular rate and rhythm, S1, S2  normal, no murmur, click, rub or gallop GI: soft, non-tender; bowel sounds normal; no masses,  no organomegaly Extremities: extremities normal, atraumatic, no cyanosis or edema  ECOG PERFORMANCE STATUS: 1 - Symptomatic but completely ambulatory  Blood pressure 139/86, pulse 83, temperature 98.5 F (36.9 C), temperature source Temporal, resp. rate 17, height 5\' 6"  (1.676 m), weight 202 lb 12.8 oz (92 kg), SpO2 95 %.  LABORATORY DATA: Lab Results  Component Value Date   WBC 3.5 (L) 02/22/2019   HGB 11.1 (L) 02/22/2019   HCT 35.7 (L) 02/22/2019   MCV 93.2 02/22/2019   PLT 260 02/22/2019      Chemistry      Component Value Date/Time   NA 138 12/23/2018 1006   NA 141 07/15/2016 0906   K 3.9 12/23/2018 1006   K 4.0 07/15/2016 0906   CL 100 12/23/2018 1006   CO2 23 12/23/2018 1006   CO2 25 07/15/2016 0906   BUN 30 (H) 12/23/2018 1006   BUN 20.7 07/15/2016 0906   CREATININE 1.20 (H) 12/23/2018 1006   CREATININE 1.69 (H) 08/19/2018 0806   CREATININE 1.1 07/15/2016 0906      Component Value Date/Time   CALCIUM 9.4 12/23/2018 1006   CALCIUM 9.6 07/15/2016 0906   ALKPHOS 32 (L) 10/25/2018 0335   ALKPHOS 29 (L) 07/15/2016 0906   AST 41 10/25/2018 0335   AST 24 08/19/2018 0806   AST 19 07/15/2016 0906   ALT 21 10/25/2018 0335   ALT 12 08/19/2018 0806   ALT 13 07/15/2016 0906   BILITOT 1.5 (H) 10/25/2018 0335   BILITOT 0.9 08/19/2018 0806   BILITOT 1.13 07/15/2016 0906       RADIOGRAPHIC STUDIES: No results found.  ASSESSMENT AND PLAN:  This is a very pleasant 78 years old white female with persistent lymphadenopathy as well as mild hepatosplenomegaly suspicious for low-grade lymphoma. The patient is currently on observation. She had a repeat CT scan of the chest, abdomen and pelvis that showed further increase in the lymphadenopathy in the chest and retroperitoneum. The patient recently underwent CT-guided core biopsy of retroperitoneal lymph node in addition to bone  marrow biopsy and aspirate.  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 The patient is currently on observation and she is feeling fine today with no concerning complaints except for the baseline fatigue and mild shortness of breath secondary to COPD and congestive heart failure. CBC today is unremarkable except for mild leukocytopenia and anemia. I recommended for the patient to continue on observation with repeat lab work as well as CT scan of the chest,  abdomen pelvis in 6 months. She was advised to call immediately if she has any concerning symptoms in the interval. The patient voices understanding of current disease status and treatment options and is in agreement with the current care plan. All questions were answered. The patient knows to call the clinic with any problems, questions or concerns. We can certainly see the patient much sooner if necessary.  Disclaimer: This note was dictated with voice recognition software. Similar sounding words can inadvertently be transcribed and may not be corrected upon review.

## 2019-02-23 ENCOUNTER — Telehealth: Payer: Self-pay | Admitting: Internal Medicine

## 2019-02-23 NOTE — Telephone Encounter (Signed)
Scheduled per los. Called and left msg. Mailed printout  °

## 2019-03-03 ENCOUNTER — Other Ambulatory Visit: Payer: Self-pay | Admitting: Cardiology

## 2019-03-03 MED ORDER — PROPRANOLOL HCL ER 60 MG PO CP24: ORAL_CAPSULE | ORAL | 3 refills | Status: DC

## 2019-03-03 NOTE — Telephone Encounter (Signed)
Pt's medication was sent to pt's pharmacy as requested confirmation received.  °

## 2019-03-03 NOTE — Telephone Encounter (Signed)
°*  STAT* If patient is at the pharmacy, call can be transferred to refill team.   1. Which medications need to be refilled? (please list name of each medication and dose if known)  New prescription with new directions for Propranolol  2. Which pharmacy/location (including street and city if local pharmacy) is medication to be sent to? [facility]-   3. Do they need a 30 day or 90 day supply?90 days and refills

## 2019-03-08 ENCOUNTER — Other Ambulatory Visit: Payer: Self-pay | Admitting: Cardiology

## 2019-03-08 ENCOUNTER — Ambulatory Visit: Payer: PPO | Admitting: Emergency Medicine

## 2019-03-08 ENCOUNTER — Other Ambulatory Visit: Payer: Self-pay

## 2019-03-08 ENCOUNTER — Encounter: Payer: Self-pay | Admitting: Emergency Medicine

## 2019-03-08 ENCOUNTER — Ambulatory Visit (INDEPENDENT_AMBULATORY_CARE_PROVIDER_SITE_OTHER): Payer: PPO | Admitting: Emergency Medicine

## 2019-03-08 DIAGNOSIS — J189 Pneumonia, unspecified organism: Secondary | ICD-10-CM | POA: Diagnosis not present

## 2019-03-08 DIAGNOSIS — J441 Chronic obstructive pulmonary disease with (acute) exacerbation: Secondary | ICD-10-CM | POA: Diagnosis not present

## 2019-03-08 MED ORDER — PROPRANOLOL HCL ER 60 MG PO CP24: 120.0000 mg | ORAL_CAPSULE | Freq: Every day | ORAL | 3 refills | Status: DC

## 2019-03-08 NOTE — Progress Notes (Signed)
Virtual Visit via Telephone Note  I connected with KALIFA CAUTHON on 03/08/19 at 11:00 AM EST by telephone and verified that I am speaking with the correct person using two identifiers.  Location: Patient: Home Provider: Office   I discussed the limitations, risks, security and privacy concerns of performing an evaluation and management service by telephone and the availability of in person appointments. I also discussed with the patient that there may be a patient responsible charge related to this service. The patient expressed understanding and agreed to proceed.   History of Present Illness: 78 year old woman with a history of COPD, bronchomalacia with associated cough.  She also has a history of atrial fibrillation, hypertension with diastolic CHF, is followed by Dr. 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  She was hospitalized in July with pyelonephritis and sepsis, was treated with prednisone during that hospitalization for possible component of COPD exacerbation.   Observations/Objective: I last saw her in February 2020.  At that time she was on Spiriva Respimat.  During her hospitalization Pulmicort nebulizers were added. She believes that she has benefits from the Pulmiocort She reports that she has been having more trouble over the last 2-3 days. She has SOB, difficulty getting a deep breath, SpO2 has been 90% to 88%. No edema. Her lasix has been stable for moths. She is using albuterol more, 0-2x a day. She has not heard any wheeze. Some nasal congestion, but mainly dyspnea.   Assessment and Plan: She is having increased symptoms of dyspnea.  It does not sound like her CHF is decompensated, suspect that this is her COPD.  Based on her description is hard to tell whether this is acute or subacute.  She does not have any wheezing.  I think she needs to be seen here to ensure she does not have an acute exacerbation.  In the meantime I  would like to try changing her Spiriva Respimat to Stiolto Respimat.  We can do this at her next visit if it is going to be soon.  Alternatively we will send the Stiolto to her pharmacy.  When she is seen here I had like for her to have a walking oximetry.  She is seen some desaturations at home.  If in retrospect she does have increased edema and evidence of decompensated CHF when she is seen here then we will need to coordinate with [name], try to adjust her diuretic regimen in addition to the bronchodilator change.  Follow Up Instructions: Office visit with Cameron Schwinn or APP next available with a walking oximetry at the same visit   I discussed the assessment and treatment plan with the patient. The patient was provided an opportunity to ask questions and all were answered. The patient agreed with the plan and demonstrated an understanding of the instructions.   The patient was advised to call back or seek an in-person evaluation if the symptoms worsen or if the condition fails to improve as anticipated.  I provided 15 minutes of non-face-to-face time during this encounter.   Collene Gobble, MD

## 2019-03-08 NOTE — Progress Notes (Signed)
_0  ID: Kristin Hood, female    DOB: [birth year], 78 y.o.   MRN: [id number]  Chief Complaint  Patient presents with   Follow-up    F/U from televisit yesterday with Kristin Hood. Increased SOB. Does not use O2. Had a reading of 88% at home with personal pulse O2.     Referring provider: [name], MD  HPI:  79 year old female former smoker followed in our office for COPD  Patient is also followed by [name] oncology Dr. [name] for lymphoma  PMH: CAD, history of stroke, obesity, hypertension, A. fib, hyperlipidemia, chronic diastolic heart failure, hypothyroidism, GERD, chronic kidney disease stage III Smoker/ Smoking History: Former smoker.  Quit 1993.  60-pack-year smoking history Maintenance: Spiriva Respimat 2.5, Pulmicort nebs Pt of: Dr. [name]  03/09/2019  - Visit   78 year old female former smoker followed in our office for COPD.  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  Last office visit with oncology was on 02/22/2019.  Notes from plan of care on 02/22/2019 office note with Dr. [name] are listed below:  ASSESSMENT AND PLAN:  This is a very pleasant 78 years old white female with persistent lymphadenopathy as well as mild hepatosplenomegaly suspicious for low-grade lymphoma. The patient is currently on observation. She had a repeat CT scan of the chest, abdomen and pelvis that showed further increase in the lymphadenopathy in the chest and retroperitoneum. The patient recently underwent CT-guided core biopsy of retroperitoneal lymph node in addition to bone marrow biopsy and aspirate.  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 The patient is currently on observation and she is feeling fine today with no concerning complaints except for the baseline fatigue and mild shortness of breath secondary to COPD and congestive heart failure. CBC today is unremarkable except for mild  leukocytopenia and anemia. I recommended for the patient to continue on observation with repeat lab work as well as CT scan of the chest, abdomen pelvis in 6 months.  Patient completed a virtual visit with Dr. [name] on 03/08/2019.  At that time patient was still reporting that she had increased dyspnea.  It was recommended that she present to our office for a walk as well as a in person physical evaluation.  It was also recommended for the patient to try Stiolto Respimat in place of Spiriva Respimat.  We will try to coordinate that today.  There were also concerns during her virtual visit that she was having increased lower extremity swelling.  She is followed by cardiology [name].  We will further evaluate that today.  Patient presents today for a physical in person evaluation after completing a walk in our office.  Patient tolerated walk in office today without any oxygen desaturations.  Heart rate did increase to mild tachycardia with walk in office.  Patient reports that she has been using her rescue inhaler 2 times a day for the last 1 to 2 weeks.  This is a change for her.  She also admits that she forgets to take her Spiriva 2 times a week.  She is started to place it with her budesonide nebs so that way she can improve adherence to using her Spiriva.  She does feel that the budesonide nebs help.  No previously elevated eosinophil counts can be seen in the chart.  Patient feels that damp weather affects her breathing.  She believes the changes in barometric pressure can directly affect her breathing and she  believes that is what is causing her shortness of breath.  She did attend a family Thanksgiving she reports that it was socially distanced and limited to just her immediate family.  They did not allow anyone to visit who is having sick symptoms.  No one else from that Thanksgiving meal has any sick symptoms or fevers.  She has had 1 week of cough with clear congestion.  She is followed by  cardiology [name].  She is managed on diuretics.  She takes Lasix 40 mg every other day and Lasix 20 mg the days in between.  She recently was evaluated by [facility] NP on 03/08/2019.  At that point time patient was recommended to take Lasix 40 mg daily for 2 days until she could be evaluated by our office.  The hope is that with [facility] that patient will not need to frequent emergency rooms as often.  [facility] also performed a chest x-ray on 03/08/2019.  Patient feels that she was told that she had a right lower lobe pneumonia.  I do not have these images to review.  We do not have a copy of the radiology report from this x-ray.  [facility] reports that [facility] that [facility] were faxing that information to our office.  It is not available in my box or Dr. Agustina Caroli.  In addition to the Lasix [facility] NP also started the patient on a Z-Pak.  Questionaires / Pulmonary Flowsheets:   MMRC: mMRC Dyspnea Scale mMRC Score  03/09/2019 3    Tests:   08/19/2018-CT chest without contrast-compared to the 01/20/2018 the tumor burden of the presumed lymphoma is similar, thoracic adenopathy is mildly increased while abdominal adenopathy is improved, increase in small right sided pleural effusion, coronary artery disease, emphysema, pulmonary artery enlargement suggests PAH, similar probable mosaic perfusion possibly related to air trapping  10/27/2018-echocardiogram-LV ejection fraction 60 to 65%, RV has normal systolic function, left atrial size was severely dilated, right atrial size was severely dilated mild stenosis of aortic valve  12/28/2014-pulmonary function test-FVC 1.97 (67% predicted), postbronchodilator ratio 80, postbronchodilator FEV1 1.64 (75% predicted), no bronchodilator response, DLCO 15.66 (61% predicted)  FENO:  No results found for: NITRICOXIDE  PFT: PFT Results Latest Ref Rng & Units 12/28/2014  FVC-Pre L 1.97  FVC-Predicted Pre % 67  FVC-Post L 2.05  FVC-Predicted  Post % 70  Pre FEV1/FVC % % 76  Post FEV1/FCV % % 80  FEV1-Pre L 1.50  FEV1-Predicted Pre % 68  FEV1-Post L 1.64  DLCO UNC% % 61  DLCO COR %Predicted % 88  TLC L 4.48  TLC % Predicted % 86  RV % Predicted % 95    WALK:  SIX MIN WALK 03/09/2019  Supplimental Oxygen during Test? (L/min) No  Tech Comments: Patient was able to complete 1.5 laps. She was able to complete the walk at steady pace while using her walker. She stated that she had some lower back pain but this was going on before her appt. Did complain of SOB after first lap but she wanted to continue on. She completed half of the 2nd lap before she wanted to return back to her room.    Imaging: Dg Chest 2 View  Result Date: 03/09/2019 CLINICAL DATA:  Shortness of breath, right lower lobe pneumonia on prior outside radiographs EXAM: CHEST - 2 VIEW COMPARISON:  Chest radiographs, 10/24/2018, CT chest, 08/19/2018 FINDINGS: Cardiomegaly. Dense mitral annulus calcifications. There is a small right pleural effusion with associated atelectasis or  consolidation, which is generally similar in appearance to appearance of prior CT dated 08/19/2018 although not seen on radiographic examination dated 10/24/2018. The visualized skeletal structures are unremarkable. IMPRESSION: 1. There is a small right pleural effusion with associated atelectasis or consolidation, which is generally similar in appearance to appearance of prior CT dated 08/19/2018 although not seen on radiographic examination dated 10/24/2018. 2. Stable cardiomegaly with dense mitral annulus calcifications. Electronically Signed   By: Eddie Candle M.D.   On: 03/09/2019 12:34    Lab Results:  CBC    Component Value Date/Time   WBC 6.9 03/09/2019 1215   RBC 4.25 03/09/2019 1215   HGB 12.3 03/09/2019 1215   HGB 11.1 (L) 02/22/2019 1253   HGB 11.3 05/21/2017 1027   HGB 11.5 (L) 07/15/2016 0905   HCT 37.5 03/09/2019 1215   HCT 35.0 05/21/2017 1027   HCT 34.3 (L) 07/15/2016  0905   PLT 338.0 03/09/2019 1215   PLT 260 02/22/2019 1253   PLT 325 05/21/2017 1027   MCV 88.3 03/09/2019 1215   MCV 88 05/21/2017 1027   MCV 86.5 07/15/2016 0905   MCH 29.0 02/22/2019 1253   MCHC 32.9 03/09/2019 1215   RDW 15.6 (H) 03/09/2019 1215   RDW 14.5 05/21/2017 1027   RDW 15.4 (H) 07/15/2016 0905   LYMPHSABS 0.4 (L) 03/09/2019 1215   LYMPHSABS 0.8 01/06/2017 1122   LYMPHSABS 0.3 (L) 07/15/2016 0905   MONOABS 0.4 03/09/2019 1215   MONOABS 0.4 07/15/2016 0905   EOSABS 0.0 03/09/2019 1215   EOSABS 0.1 01/06/2017 1122   BASOSABS 0.0 03/09/2019 1215   BASOSABS 0.0 01/06/2017 1122   BASOSABS 0.0 07/15/2016 0905    BMET    Component Value Date/Time   NA 134 (L) 03/09/2019 1215   NA 138 12/23/2018 1006   NA 141 07/15/2016 0906   K 4.0 03/09/2019 1215   K 4.0 07/15/2016 0906   CL 96 03/09/2019 1215   CO2 31 03/09/2019 1215   CO2 25 07/15/2016 0906   GLUCOSE 104 (H) 03/09/2019 1215   GLUCOSE 115 07/15/2016 0906   BUN 25 (H) 03/09/2019 1215   BUN 30 (H) 12/23/2018 1006   BUN 20.7 07/15/2016 0906   CREATININE 0.91 03/09/2019 1215   CREATININE 1.12 (H) 02/22/2019 1253   CREATININE 1.1 07/15/2016 0906   CALCIUM 9.6 03/09/2019 1215   CALCIUM 9.6 07/15/2016 0906   GFRNONAA 47 (L) 02/22/2019 1253   GFRAA 54 (L) 02/22/2019 1253    BNP    Component Value Date/Time   BNP 498.3 (H) 10/24/2018 0354    ProBNP    Component Value Date/Time   PROBNP 597.0 (H) 03/09/2019 1215    Specialty Problems      Pulmonary Problems   SOB (shortness of breath)   COPD (chronic obstructive pulmonary disease) (HCC)   Acute respiratory failure with hypoxemia (HCC)   Pulmonary nodules   Allergic rhinitis   Acute respiratory failure with hypoxia (HCC)   COPD exacerbation (HCC)      Allergies  Allergen Reactions   Floxin [Ofloxacin] Nausea Only and Other (See Comments)    Dizziness, Vertigo     Immunization History  Administered Date(s) Administered   Influenza Split  11/29/2014   Influenza, High Dose Seasonal PF 12/09/2016   Influenza,inj,Quad PF,6+ Mos 11/16/2015   Influenza-Unspecified 11/30/2013, 01/15/2018   Pneumococcal Conjugate-13 06/07/2014   Pneumococcal-Unspecified 02/26/2011   Zoster 07/01/2010    Past Medical History:  Diagnosis Date   Acute congestive heart failure ([place])  Acute diastolic CHF (congestive heart failure) ([place]) 12/30/2016   Acute respiratory failure with hypoxemia ([place]) 06/09/2016   Acute respiratory failure with hypoxia ([place]) 12/30/2016   Anginal pain ([place])    ARF (acute renal failure) ([place]) 06/11/2016   Arthritis    Atrial fibrillation ([place]) 07/2012   Eliquis, rate controlled   Benign hypertensive heart disease without heart failure 12/25/2012   Breast cancer ([place]) 2001   left mastectomy   Chest pain 12/11/2012   CHF (congestive heart failure) ([place]) 06/10/2016   Chronic atrial fibrillation (HCC) 11/16/2014   Chronic diastolic heart failure ([place]) 02/13/2014   Coarse tremors    , Essential   Complication of anesthesia    SMALLER TUBE FOR INTUBATION AS GAGS ON TUBE   COPD (chronic obstructive pulmonary disease) ([place])    COPD exacerbation ([place]) 12/30/2016   CVA (cerebral vascular accident) ([place]) 2013   Dysrhythmia 08/20/2012   Atrial Fibrillation   Essential and other specified forms of tremor 06/18/2012   Essential hypertension 06/18/2012   Family history of adverse reaction to anesthesia    mother experienced pain with intubation    GERD (gastroesophageal reflux disease)    history of    Hard of hearing    Heart murmur    History of chemotherapy 11/13/1998 to 2010   Adriamycin/Docotaxol, Dexorubicin/Taxotere, Femara, Tamoxifen   History of CVA (cerebrovascular accident) 12/11/2012   History of hiatal hernia    history of    Hx of adenomatous colonic polyps 2006   , Benign   Hypercholesteremia    Hyperlipidemia 02/13/2014   Hypertension    , With mild concentric left  ventricular hypertrophy   Long term current use of anticoagulant therapy 12/25/2012   Mixed dyslipidemia    Mixed hyperlipidemia 12/25/2012   Morbid obesity ([place]) 06/18/2012   Phlebitis and thrombophlebitis of superficial vessels of lower extremities 12/25/2012   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   Pure hypercholesterolemia 12/25/2012   Shingles    Shortness of breath dyspnea    on exertion    SOB (shortness of breath) 11/16/2014   Spider veins    Stroke ([place]) 11/17/2011   left side brain-speech-TPA   Thyroid nodule    Tremor, essential    Unspecified cerebral artery occlusion with cerebral infarction 11/18/2011   Valvular sclerosis 09/23/2003   without stenosis   Varicose veins     Tobacco History: Social History   Tobacco Use  Smoking Status Former Smoker   Packs/day: 2.00   Years: 30.00   Pack years: 60.00   Types: Cigarettes   Quit date: 01/30/1992   Years since quitting: 27.1  Smokeless Tobacco Never Used   Counseling given: Not Answered   Continue to not smoke  Outpatient Encounter Medications as of 03/09/2019  Medication Sig   albuterol (PROVENTIL HFA;VENTOLIN HFA) 108 (90 Base) MCG/ACT inhaler Inhale 2 puffs into the lungs every 4 (four) hours as needed for wheezing or shortness of breath.   atorvastatin (LIPITOR) 20 MG tablet Take 20 mg by mouth at bedtime.    azithromycin (ZITHROMAX) 250 MG tablet Take by mouth as directed.   budesonide (PULMICORT) 0.25 MG/2ML nebulizer solution Take 2 mLs (0.25 mg total) by nebulization 2 (two) times daily.   ELIQUIS 5 MG TABS tablet TAKE 1 TABLET BY MOUTH TWICE A DAY   fenofibrate 160 MG tablet Take 160 mg by mouth at bedtime.    fluticasone (FLONASE) 50 MCG/ACT nasal spray Place 2 sprays into both nostrils daily as needed for allergies or rhinitis.  furosemide (LASIX) 40 MG tablet Take 1 tablet by mouth alternating with 1/2 tablet by mouth qod   guaiFENesin (MUCINEX) 600 MG 12 hr tablet Take  600 mg by mouth daily.   levothyroxine (SYNTHROID) 175 MCG tablet TAKE 1 TABLET BY MOUTH EVERY DAY BEFORE BREAKFAST   loratadine (CLARITIN) 10 MG tablet Take 10 mg by mouth daily as needed for allergies (rash).    Multiple Vitamin (MULTIVITAMIN WITH MINERALS) TABS tablet Take 1 tablet by mouth daily.    polyvinyl alcohol (ARTIFICIAL TEARS) 1.4 % ophthalmic solution Place 1 drop into both eyes daily as needed for dry eyes.   predniSONE (DELTASONE) 20 MG tablet Take 40 mg by mouth daily.   Tiotropium Bromide Monohydrate (SPIRIVA RESPIMAT) 2.5 MCG/ACT AERS Inhale 2 puffs into the lungs daily.   [DISCONTINUED] propranolol ER (INDERAL LA) 60 MG 24 hr capsule Take 2 capsules (120 mg total) by mouth daily.   Tiotropium Bromide-Olodaterol (STIOLTO RESPIMAT) 2.5-2.5 MCG/ACT AERS Inhale 2 puffs into the lungs daily.   [DISCONTINUED] propranolol ER (INDERAL LA) 60 MG 24 hr capsule TAKE 2 CAPS IN THE MORNING TAKE 1 CAP IN THE AFTERNOON, 1 CAP AT NIGHT   No facility-administered encounter medications on file as of 03/09/2019.      Review of Systems  Review of Systems  Constitutional: Positive for activity change and fatigue. Negative for fever.  HENT: Negative for sinus pressure, sinus pain and sore throat.   Respiratory: Positive for cough (clear thick mucous ), shortness of breath and wheezing.   Cardiovascular: Negative for chest pain and palpitations.       Sob during the day, worse at night, doesn't lay flat  Gastrointestinal: Negative for diarrhea, nausea and vomiting.  Musculoskeletal: Negative for arthralgias.  Neurological: Negative for dizziness.  Psychiatric/Behavioral: Positive for sleep disturbance. The patient is nervous/anxious.      Physical Exam  BP 128/84 (BP Location: Right Arm, Patient Position: Sitting, Cuff Size: Normal)    Pulse 78    Temp (!) 97.2 F (36.2 C) (Temporal)    Ht '5\' 6"'$  (1.676 m)    Wt 203 lb (92.1 kg)    SpO2 99% Comment: on RA   BMI 32.77 kg/m   Wt  Readings from Last 5 Encounters:  03/09/19 203 lb (92.1 kg)  02/22/19 202 lb 12.8 oz (92 kg)  02/10/19 202 lb 6.4 oz (91.8 kg)  11/16/18 210 lb (95.3 kg)  11/11/18 210 lb 12.8 oz (95.6 kg)    BMI Readings from Last 5 Encounters:  03/09/19 32.77 kg/m  02/22/19 32.73 kg/m  02/10/19 32.67 kg/m  11/16/18 33.89 kg/m  11/11/18 34.02 kg/m     Physical Exam Vitals signs and nursing note reviewed.  Constitutional:      General: She is not in acute distress.    Appearance: She is obese.     Comments: Frail elderly female  HENT:     Head: Normocephalic and atraumatic.     Right Ear: Tympanic membrane, ear canal and external ear normal. There is no impacted cerumen.     Left Ear: Tympanic membrane, ear canal and external ear normal. There is no impacted cerumen.     Nose: Nose normal. No congestion or rhinorrhea.     Mouth/Throat:     Mouth: Mucous membranes are moist.     Pharynx: Oropharynx is clear.  Eyes:     Pupils: Pupils are equal, round, and reactive to light.  Neck:     Musculoskeletal: Normal range of motion.  Cardiovascular:     Rate and Rhythm: Normal rate. Rhythm regularly irregular.     Pulses: Normal pulses.     Heart sounds: No murmur.  Pulmonary:     Effort: Pulmonary effort is normal. No respiratory distress.     Breath sounds: No decreased air movement. No decreased breath sounds, wheezing or rales.     Comments: Diminished breath sounds on exam, diminished in the bases right greater than left Musculoskeletal:     Right lower leg: 1+ Pitting Edema present.     Left lower leg: 1+ Pitting Edema present.  Skin:    General: Skin is warm and dry.     Capillary Refill: Capillary refill takes less than 2 seconds.  Neurological:     General: No focal deficit present.     Mental Status: She is alert and oriented to person, place, and time. Mental status is at baseline.     Gait: Gait abnormal (Tolerated walk in office, utilized walker).  Psychiatric:         Mood and Affect: Mood normal.        Behavior: Behavior normal.        Thought Content: Thought content normal.        Judgment: Judgment normal.       Assessment & Plan:   Chronic atrial fibrillation (HCC) Likely in A. fib today or frequent PACs   Plan: Follow-up with cardiology after completion of lab work  Chronic diastolic heart failure ([place]) Suspect patient may be fluid overloaded Question whether or not patient may have pleural effusion with diminished breath sounds at bases Do not believe the patient is having bacterial pneumonia which is what she was empirically treated for by home health NP  Plan: Lab work today Chest x-ray today to further evaluate Likely will need follow-up with [name] 2-week follow-up in office  COPD (chronic obstructive pulmonary disease) ([place]) Struggles with adherence to Spiriva Respimat Feels the budesonide nebs to help with breathing Using rescue inhaler 2 times daily Increased cough with clear congestion No audible wheeze on exam today Diminished breath sounds in bases, right greater than left Walk today in office shows no oxygen desaturations  Plan: Chest x-ray today Lab work today Trial of Stiolto Respimat, stop Spiriva Respimat Continue budesonide nebs twice daily Work on improving adherence to maintenance regimen Will obtain overnight oximetry to evaluate oxygen needs at night Close follow-up with our office in 2 weeks    Marginal zone lymphoma ([place]) Plan: Continue to follow-up with oncology/Dr. [name]  SOB (shortness of breath) Suspect that this is continued to be a multifactorial problem for the patient.  She has multiple contributing factors to shortness of breath.  COPD, inhaler nonadherence, likely diastolic heart failure/fluid overload, questionable pneumonia, physically deconditioned, restrictive patterns on PFTs.  Walk today in office revealed no oxygen desaturations  Plan: Lab work today X-ray  today Complete Z-Pak as ordered by home health NP Likely will need follow-up with cardiology Trial of Stiolto Respimat Close follow-up in the office in 2 weeks Will obtain overnight oximetry to evaluate oxygen needs at night    Return in about 2 weeks (around 03/23/2019), or if symptoms worsen or fail to improve, for Follow up with Dr. [name], Follow up with Wyn Quaker FNP-C.   Lauraine Rinne, NP 03/09/2019   This appointment was 30 minutes long with over 50% of the time in direct face-to-face patient care, assessment, plan of care, and follow-up.

## 2019-03-08 NOTE — Telephone Encounter (Signed)
New Message   *STAT* If patient is at the pharmacy, call can be transferred to refill team.   1. Which medications need to be refilled? (please list name of each medication and dose if known)  propranolol ER (INDERAL LA) 60 MG 24 hr capsule Patient is taking 2 caps in the morning and 1 in the afternoon, patient is no longer taking night dosage  Please correct this on prescription.  2. Which pharmacy/location (including street and city if local pharmacy) is medication to be sent to? [facility] - [place], [place] - [place] [address]  3. Do they need a 30 day or 90 day supply? 90 day

## 2019-03-09 ENCOUNTER — Ambulatory Visit (INDEPENDENT_AMBULATORY_CARE_PROVIDER_SITE_OTHER): Payer: PPO

## 2019-03-09 ENCOUNTER — Telehealth: Payer: Self-pay | Admitting: Cardiology

## 2019-03-09 ENCOUNTER — Other Ambulatory Visit: Payer: Self-pay | Admitting: Cardiology

## 2019-03-09 ENCOUNTER — Other Ambulatory Visit: Payer: Self-pay

## 2019-03-09 ENCOUNTER — Ambulatory Visit (INDEPENDENT_AMBULATORY_CARE_PROVIDER_SITE_OTHER): Payer: PPO | Admitting: Pulmonary Disease

## 2019-03-09 ENCOUNTER — Encounter: Payer: Self-pay | Admitting: Pulmonary Disease

## 2019-03-09 VITALS — BP 128/84 | HR 78 | Temp 97.2°F | Ht 66.0 in | Wt 203.0 lb

## 2019-03-09 DIAGNOSIS — J441 Chronic obstructive pulmonary disease with (acute) exacerbation: Secondary | ICD-10-CM | POA: Diagnosis not present

## 2019-03-09 DIAGNOSIS — R0602 Shortness of breath: Secondary | ICD-10-CM | POA: Diagnosis not present

## 2019-03-09 DIAGNOSIS — I5032 Chronic diastolic (congestive) heart failure: Secondary | ICD-10-CM | POA: Diagnosis not present

## 2019-03-09 DIAGNOSIS — C8580 Other specified types of non-Hodgkin lymphoma, unspecified site: Secondary | ICD-10-CM

## 2019-03-09 DIAGNOSIS — I482 Chronic atrial fibrillation, unspecified: Secondary | ICD-10-CM

## 2019-03-09 LAB — CBC WITH DIFFERENTIAL/PLATELET
Basophils Absolute: 0 10*3/uL (ref 0.0–0.1)
Basophils Relative: 0.2 % (ref 0.0–3.0)
Eosinophils Absolute: 0 10*3/uL (ref 0.0–0.7)
Eosinophils Relative: 0.2 % (ref 0.0–5.0)
HCT: 37.5 % (ref 36.0–46.0)
Hemoglobin: 12.3 g/dL (ref 12.0–15.0)
Lymphocytes Relative: 5.7 % — ABNORMAL LOW (ref 12.0–46.0)
Lymphs Abs: 0.4 10*3/uL — ABNORMAL LOW (ref 0.7–4.0)
MCHC: 32.9 g/dL (ref 30.0–36.0)
MCV: 88.3 fl (ref 78.0–100.0)
Monocytes Absolute: 0.4 10*3/uL (ref 0.1–1.0)
Monocytes Relative: 6.5 % (ref 3.0–12.0)
Neutro Abs: 6 10*3/uL (ref 1.4–7.7)
Neutrophils Relative %: 87.4 % — ABNORMAL HIGH (ref 43.0–77.0)
Platelets: 338 10*3/uL (ref 150.0–400.0)
RBC: 4.25 Mil/uL (ref 3.87–5.11)
RDW: 15.6 % — ABNORMAL HIGH (ref 11.5–15.5)
WBC: 6.9 10*3/uL (ref 4.0–10.5)

## 2019-03-09 LAB — BASIC METABOLIC PANEL
BUN: 25 mg/dL — ABNORMAL HIGH (ref 6–23)
CO2: 31 mEq/L (ref 19–32)
Calcium: 9.6 mg/dL (ref 8.4–10.5)
Chloride: 96 mEq/L (ref 96–112)
Creatinine, Ser: 0.91 mg/dL (ref 0.40–1.20)
GFR: 59.54 mL/min — ABNORMAL LOW (ref >60.00)
Glucose, Bld: 104 mg/dL — ABNORMAL HIGH (ref 70–99)
Potassium: 4 mEq/L (ref 3.5–5.1)
Sodium: 134 mEq/L — ABNORMAL LOW (ref 135–145)

## 2019-03-09 LAB — BRAIN NATRIURETIC PEPTIDE: Pro B Natriuretic peptide (BNP): 597 pg/mL — ABNORMAL HIGH (ref 0.0–100.0)

## 2019-03-09 MED ORDER — STIOLTO RESPIMAT 2.5-2.5 MCG/ACT IN AERS: 2.0000 | INHALATION_SPRAY | Freq: Every day | RESPIRATORY_TRACT | 0 refills | Status: DC

## 2019-03-09 MED ORDER — PROPRANOLOL HCL ER 60 MG PO CP24: ORAL_CAPSULE | ORAL | 2 refills | Status: DC

## 2019-03-09 NOTE — Progress Notes (Signed)
Patient's blood work does show that she is likely retaining fluid.  Chest x-ray also showing right pleural effusion.  I would recommend that the patient take 40 mg of Lasix daily for the next 5 days.  She also needs to follow-up with cardiology [name].  Please route these results to [name] as well as primary care.  Wyn Quaker, FNP

## 2019-03-09 NOTE — Assessment & Plan Note (Signed)
Likely in A. fib today or frequent PACs   Plan: Follow-up with cardiology after completion of lab work

## 2019-03-09 NOTE — Telephone Encounter (Signed)
Pt calling informing [name] that her medication propranolol was decreased by her [place] and would like [name] to send in correct directions for this medication. Please address

## 2019-03-09 NOTE — Assessment & Plan Note (Signed)
Suspect patient may be fluid overloaded Question whether or not patient may have pleural effusion with diminished breath sounds at bases Do not believe the patient is having bacterial pneumonia which is what she was empirically treated for by home health NP  Plan: Lab work today Chest x-ray today to further evaluate Likely will need follow-up with [name] 2-week follow-up in office

## 2019-03-09 NOTE — Assessment & Plan Note (Signed)
Plan: Continue to follow-up with oncology/Dr. [name]

## 2019-03-09 NOTE — Progress Notes (Signed)
Small right pleural effusion on chest x-ray.  Can continue taking Z-Pak.  I believe you likely have a right pleural effusion due to retaining fluid.  I will have you follow-up with [name].  C,  Can you route this chest x-ray as well as the patient's lab work to Dr. Kandis Mannan office as well as primary care  Continue forward with Stiolto trial and follow-up with our office in 2 weeks  Wyn Quaker, FNP

## 2019-03-09 NOTE — Telephone Encounter (Signed)
Reviewed w/ [name] who is agreeable to refilling Propranolol 60 mg 2 in morning & 1 in afternoon Updated Rx sent in

## 2019-03-09 NOTE — Assessment & Plan Note (Signed)
Suspect that this is continued to be a multifactorial problem for the patient.  She has multiple contributing factors to shortness of breath.  COPD, inhaler nonadherence, likely diastolic heart failure/fluid overload, questionable pneumonia, physically deconditioned, restrictive patterns on PFTs.  Walk today in office revealed no oxygen desaturations  Plan: Lab work today X-ray today Complete Z-Pak as ordered by home health NP Likely will need follow-up with cardiology Trial of Stiolto Respimat Close follow-up in the office in 2 weeks Will obtain overnight oximetry to evaluate oxygen needs at night

## 2019-03-09 NOTE — Patient Instructions (Addendum)
You were seen today by Lauraine Rinne, NP  for:   1. SOB (shortness of breath)  - Basic Metabolic Panel (BMET); Future - B Nat Peptide; Future - CBC with Differential/Platelet; Future - DG Chest 2 View; Future  Continue Z-Pak at this time  We will follow up with you after blood work/chest x-ray result  2. Chronic diastolic heart failure (HCC)  - Basic Metabolic Panel (BMET); Future - B Nat Peptide; Future - DG Chest 2 View; Future  We will do blood work today, we will have you follow-up with [name] based off that blood work  3. Chronic obstructive pulmonary disease with acute exacerbation ([place])  - DG Chest 2 View; Future  Continue Pulmicort nebs twice daily Rinse out mouth after use  Trial of Stiolto Respimat inhaler >>>2 puffs daily >>>Take this no matter what >>>This is not a rescue inhaler  Samples provided today  Do not take Spiriva Respimat and Stiolto Respimat together  Note your daily symptoms > remember "red flags" for COPD:   >>>Increase in cough >>>increase in sputum production >>>increase in shortness of breath or activity  intolerance.   If you notice these symptoms, please call the office to be seen.    We recommend today:  Orders Placed This Encounter  Procedures  . DG Chest 2 View    Standing Status:   Future    Standing Expiration Date:   05/09/2020    Order Specific Question:   Reason for Exam (SYMPTOM  OR DIAGNOSIS REQUIRED)    Answer:   short of breath, concern from prev cxry out of system showing RLL PNU per pt    Order Specific Question:   Preferred imaging location?    Answer:   Internal    Order Specific Question:   Radiology Contrast Protocol - do NOT remove file path    Answer:   [email or web address]  . Basic Metabolic Panel (BMET)    Standing Status:   Future    Standing Expiration Date:   03/08/2020  . B Nat Peptide    Standing Status:   Future    Standing Expiration Date:   03/08/2020  . CBC  with Differential/Platelet    Standing Status:   Future    Standing Expiration Date:   03/08/2020   Orders Placed This Encounter  Procedures  . DG Chest 2 View  . Basic Metabolic Panel (BMET)  . B Nat Peptide  . CBC with Differential/Platelet   Meds ordered this encounter  Medications  . Tiotropium Bromide-Olodaterol (STIOLTO RESPIMAT) 2.5-2.5 MCG/ACT AERS    Sig: Inhale 2 puffs into the lungs daily.    Dispense:  8 g    Refill:  0    Order Specific Question:   Lot Number?    Answer:   [id number]    Order Specific Question:   Expiration Date?    Answer:   03/30/2021    Follow Up:    Return in about 2 weeks (around 03/23/2019), or if symptoms worsen or fail to improve, for Follow up with Dr. [name], Follow up with Wyn Quaker FNP-C.   Please do your part to reduce the spread of COVID-19:      Reduce your risk of any infection  and COVID19 by using the similar precautions used for avoiding the common cold or flu:  Marland Kitchen Wash your hands often with soap and warm water for at least 20 seconds.  If soap and water are not readily available, use an alcohol-based hand  sanitizer with at least 60% alcohol.  . If coughing or sneezing, cover your mouth and nose by coughing or sneezing into the elbow areas of your shirt or coat, into a tissue or into your sleeve (not your hands). Langley Gauss A MASK when in public  . Avoid shaking hands with others and consider head nods or verbal greetings only. . Avoid touching your eyes, nose, or mouth with unwashed hands.  . Avoid close contact with people who are sick. . Avoid places or events with large numbers of people in one location, like concerts or sporting events. . If you have some symptoms but not all symptoms, continue to monitor at home and seek medical attention if your symptoms worsen. . If you are having a medical emergency, call 911.   [facility] [place] [facility] / e-Visit:  [email or web address]         [facility]: [facility]: [id number]                   [facility]: [id number]     It is flu season:   >>> Best ways to protect herself from the flu: Receive the yearly flu vaccine, practice good hand hygiene washing with soap and also using hand sanitizer when available, eat a nutritious meals, get adequate rest, hydrate appropriately   Please contact the office if your symptoms worsen or you have concerns that you are not improving.   Thank you for choosing [facility] for your healthcare, and for allowing [place] to partner with you on your healthcare journey. I am thankful to be able to provide care to you today.   Wyn Quaker FNP-C

## 2019-03-09 NOTE — Assessment & Plan Note (Addendum)
Struggles with adherence to Spiriva Respimat Feels the budesonide nebs to help with breathing Using rescue inhaler 2 times daily Increased cough with clear congestion No audible wheeze on exam today Diminished breath sounds in bases, right greater than left Walk today in office shows no oxygen desaturations  Plan: Chest x-ray today Lab work today Trial of Stiolto Respimat, stop Spiriva Respimat Continue budesonide nebs twice daily Work on improving adherence to maintenance regimen Will obtain overnight oximetry to evaluate oxygen needs at night Close follow-up with our office in 2 weeks

## 2019-03-09 NOTE — Telephone Encounter (Signed)
New Message   *STAT* If patient is at the pharmacy, call can be transferred to refill team.   1. Which medications need to be refilled? (please list name of each medication and dose if known)  propranolol ER (INDERAL LA) 60 MG 24 hr capsule  UPDATED DIRECTIONS: 2 CAPSULES IN THE MORNING AND 1 IN THE AFTERNOON via Anne Ng from [place]  2. Which pharmacy/location (including street and city if local pharmacy) is medication to be sent to? [facility] - [place], [place] - [place] [address]  3. Do they need a 30 day or 90 day supply? 90 day    Please send corrected direction for prescription.

## 2019-03-12 DIAGNOSIS — R0902 Hypoxemia: Secondary | ICD-10-CM | POA: Diagnosis not present

## 2019-03-12 DIAGNOSIS — J449 Chronic obstructive pulmonary disease, unspecified: Secondary | ICD-10-CM | POA: Diagnosis not present

## 2019-03-18 NOTE — Telephone Encounter (Signed)
Ok thank you   Kristin Hood

## 2019-03-18 NOTE — Telephone Encounter (Signed)
Aaron Edelman, please see mychart message from pt as an FYI that there is a different phone number that pt wants you to call her on for the visit.  I have changed this in the appointment notes section as well. Thanks!

## 2019-03-22 NOTE — Progress Notes (Addendum)
Virtual Visit via Telephone Note  I connected with Kristin Hood on 03/23/19 at 10:00 AM EST by telephone and verified that I am speaking with the correct person using two identifiers.  Location: Patient: Home Provider: [facility] - [id number], [place], [place], [place] [address]   I discussed the limitations, risks, security and privacy concerns of performing an evaluation and management service by telephone and the availability of in person appointments. I also discussed with the patient that there may be a patient responsible charge related to this service. The patient expressed understanding and agreed to proceed.  Patient consented to consult via telephone: Yes People present and their role in pt care: Pt    History of Present Illness:  78 year old female former smoker followed in our office for COPD  Patient is also followed by [facility] health oncology Dr. [name] for lymphoma  PMH: CAD, history of stroke, obesity, hypertension, A. fib, hyperlipidemia, chronic diastolic heart failure, hypothyroidism, GERD, chronic kidney disease stage III COPD.  Patient was last seen in our office on 03/09/2019.  Where she was evaluated for dyspnea on exertion.  At that office visit she was trialed on Stiolto Respimat.  She was also instructed to finish the Z-Pak that her home health nurse practitioner prescribed for her. Smoker/ Smoking History: Former smoker.  Quit 1993.  60-pack-year smoking history Maintenance: Stiolto, Pulmicort nebs Pt of: Dr. [name]   Chief complaint: 2 week follow up   78 year old female former smoker followed in our office for COPD and dyspnea on exertion.  Patient was last seen in 03/09/2019. Pt was diuresis with lasix and results were sent to cardiology who agreed with plan of care.  Patient reports she has been doing well since last speaking with me 2 weeks ago in person.  She is finished her Z-Pak and prednisone.  She also took additional doses of Lasix  for 5 days.  She felt like that helped.  She is back to taking her Lasix 40 mg 1 day 20 mg the next and rotating back-and-forth as managed by [name] her cardiologist.  She does not believe that her lower extremities are showing swelling.  She does not feel that close are fitting tighter than usual.  She denies any worsened orthopnea than her baseline.  She reports that her weights have slowly increased.  She attributes this to her Christmas dieting.  She reports that she has not had any overly salty meals.  She does still have some chest congestion.  She was treated empirically with antibiotics and chest x-ray did not show an infiltrate on 03/09/2019.  Patient does admit that she did not take her Mucinex yesterday so this could be why the congestion seems a little bit more severe today.  Patient has planned follow-up with [name] in January/2021.  Patient recently completed an overnight oximetry last week with her DME company per the patient.  We have not received these results.  We will request the results today from the DME company.  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 similar, thoracic adenopathy is mildly increased while abdominal adenopathy is improved, increase in small right sided pleural effusion, coronary artery disease, emphysema, pulmonary artery enlargement suggests PAH, similar probable mosaic perfusion possibly related to air trapping  10/27/2018-echocardiogram-LV ejection fraction 60 to 65%, RV has normal systolic function, left atrial size was  severely dilated, right atrial size was severely dilated mild stenosis of aortic valve  12/28/2014-pulmonary function test-FVC 1.97 (67% predicted), postbronchodilator ratio 80, postbronchodilator FEV1 1.64 (75% predicted), no bronchodilator response, DLCO 15.66 (61% predicted)  Social History    Tobacco Use  Smoking Status Former Smoker  . Packs/day: 2.00  . Years: 30.00  . Pack years: 60.00  . Types: Cigarettes  . Quit date: 01/30/1992  . Years since quitting: 27.1  Smokeless Tobacco Never Used   Immunization History  Administered Date(s) Administered  . Influenza Split 11/29/2014  . Influenza, High Dose Seasonal PF 12/09/2016  . Influenza,inj,Quad PF,6+ Mos 11/16/2015  . Influenza-Unspecified 11/30/2013, 01/15/2018  . Pneumococcal Conjugate-13 06/07/2014  . Pneumococcal-Unspecified 02/26/2011  . Zoster 07/01/2010    Assessment and Plan:  Chronic diastolic heart failure (HCC) Elevated BNP Patient felt symptomatic relief when aggressively diuresed Potential effusion on 03/09/2019 x-ray  Plan: We will have patient start Lasix 40 mg daily We will notify cardiology today We will route note Explained to patient that cardiology may ultimately change the schedule or see the patient sooner than her scheduled appointment in January/2021 with [name] Emphasized the need for her to follow low-sodium diet Encourage patient to continue to weigh herself   COPD mixed type [facility]) Plan: Prescription sent to the pharmacy for [place] to continue take Mucinex Continue Pulmicort nebs twice daily Will request ONO results  4-week follow-up in office with Dr. [name] or Wyn Quaker FNP  SOB (shortness of breath) Improved symptomatically Still feels that she may have a slow to resolve CHF exacerbation  Plan: We will increase Lasix to 40 mg daily Do not believe there is an indication for antibiotics at this time Do not believe the patient needs additional doses of prednisone Emphasized the need for the patient to continue to be adherent to her maintenance respiratory medications Okay to continue to take Mucinex    Follow Up Instructions:  Return in about 4 weeks (around 04/20/2019), or if symptoms worsen or fail to improve, for Follow up with Dr. [name], Follow up with  Wyn Quaker FNP-C.   I discussed the assessment and treatment plan with the patient. The patient was provided an opportunity to ask questions and all were answered. The patient agreed with the plan and demonstrated an understanding of the instructions.   The patient was advised to call back or seek an in-person evaluation if the symptoms worsen or if the condition fails to improve as anticipated.  I provided 23 minutes of non-face-to-face time during this encounter.   Lauraine Rinne, NP

## 2019-03-23 ENCOUNTER — Ambulatory Visit (INDEPENDENT_AMBULATORY_CARE_PROVIDER_SITE_OTHER): Payer: PPO | Admitting: Pulmonary Disease

## 2019-03-23 ENCOUNTER — Other Ambulatory Visit: Payer: Self-pay

## 2019-03-23 ENCOUNTER — Encounter: Payer: Self-pay | Admitting: Pulmonary Disease

## 2019-03-23 DIAGNOSIS — I509 Heart failure, unspecified: Secondary | ICD-10-CM

## 2019-03-23 DIAGNOSIS — Z87891 Personal history of nicotine dependence: Secondary | ICD-10-CM | POA: Diagnosis not present

## 2019-03-23 DIAGNOSIS — J449 Chronic obstructive pulmonary disease, unspecified: Secondary | ICD-10-CM

## 2019-03-23 DIAGNOSIS — I5032 Chronic diastolic (congestive) heart failure: Secondary | ICD-10-CM

## 2019-03-23 DIAGNOSIS — R0602 Shortness of breath: Secondary | ICD-10-CM | POA: Diagnosis not present

## 2019-03-23 DIAGNOSIS — Z7951 Long term (current) use of inhaled steroids: Secondary | ICD-10-CM | POA: Diagnosis not present

## 2019-03-23 NOTE — Assessment & Plan Note (Signed)
Improved symptomatically Still feels that she may have a slow to resolve CHF exacerbation  Plan: We will increase Lasix to 40 mg daily Do not believe there is an indication for antibiotics at this time Do not believe the patient needs additional doses of prednisone Emphasized the need for the patient to continue to be adherent to her maintenance respiratory medications Okay to continue to take Mucinex

## 2019-03-23 NOTE — Patient Instructions (Signed)
You were seen today by Lauraine Rinne, NP  for:   1. COPD mixed type (HCC)  Stiolto Respimat inhaler >>>2 puffs daily >>>Take this no matter what >>>This is not a rescue inhaler  Continue Pulmicort nebs twice daily  Note your daily symptoms > remember "red flags" for COPD:   >>>Increase in cough >>>increase in sputum production >>>increase in shortness of breath or activity  intolerance.   If you notice these symptoms, please call the office to be seen.    2. Chronic diastolic heart failure ([place]) 3. SOB (shortness of breath)  Increase Lasix to 40 mg daily  Continue to follow-up with cardiology  Continue to follow low-sodium diet  Continue to weigh yourself daily, notify cardiology of continued weight increases Monitor symptoms of lower extremity swelling or abdominal swelling that could indicate fluid retention   Follow Up:    Return in about 4 weeks (around 04/20/2019), or if symptoms worsen or fail to improve, for Follow up with Dr. [name], Follow up with Wyn Quaker FNP-C.   Please do your part to reduce the spread of COVID-19:      Reduce your risk of any infection  and COVID19 by using the similar precautions used for avoiding the common cold or flu:  Marland Kitchen Wash your hands often with soap and warm water for at least 20 seconds.  If soap and water are not readily available, use an alcohol-based hand sanitizer with at least 60% alcohol.  . If coughing or sneezing, cover your mouth and nose by coughing or sneezing into the elbow areas of your shirt or coat, into a tissue or into your sleeve (not your hands). Langley Gauss A MASK when in public  . Avoid shaking hands with others and consider head nods or verbal greetings only. . Avoid touching your eyes, nose, or mouth with unwashed hands.  . Avoid close contact with people who are sick. . Avoid places or events with large numbers of people in one location, like concerts or sporting events. . If you have some symptoms but not all  symptoms, continue to monitor at home and seek medical attention if your symptoms worsen. . If you are having a medical emergency, call 911.   [place] / e-Visit: [email or web address]         [facility]: [facility]: [id number]                   [facility]: [id number]     It is flu season:   >>> Best ways to protect herself from the flu: Receive the yearly flu vaccine, practice good hand hygiene washing with soap and also using hand sanitizer when available, eat a nutritious meals, get adequate rest, hydrate appropriately   Please contact the office if your symptoms worsen or you have concerns that you are not improving.   Thank you for choosing [facility] for your healthcare, and for allowing [place] to partner with you on your healthcare journey. I am thankful to be able to provide care to you today.   Wyn Quaker FNP-C

## 2019-03-23 NOTE — Assessment & Plan Note (Signed)
Elevated BNP Patient felt symptomatic relief when aggressively diuresed Potential effusion on 03/09/2019 x-ray  Plan: We will have patient start Lasix 40 mg daily We will notify cardiology today We will route note Explained to patient that cardiology may ultimately change the schedule or see the patient sooner than her scheduled appointment in January/2021 with [name] Emphasized the need for her to follow low-sodium diet Encourage patient to continue to weigh herself

## 2019-03-23 NOTE — Assessment & Plan Note (Addendum)
Plan: Prescription sent to the pharmacy for Stiolto Okay to continue take Mucinex Continue Pulmicort nebs twice daily Will request ONO results  4-week follow-up in office with Dr. [name] or Wyn Quaker FNP

## 2019-03-30 ENCOUNTER — Telehealth: Payer: Self-pay | Admitting: Pulmonary Disease

## 2019-03-30 DIAGNOSIS — I5032 Chronic diastolic (congestive) heart failure: Secondary | ICD-10-CM | POA: Diagnosis not present

## 2019-03-30 DIAGNOSIS — E890 Postprocedural hypothyroidism: Secondary | ICD-10-CM | POA: Diagnosis not present

## 2019-03-30 DIAGNOSIS — I509 Heart failure, unspecified: Secondary | ICD-10-CM | POA: Diagnosis not present

## 2019-03-30 DIAGNOSIS — I10 Essential (primary) hypertension: Secondary | ICD-10-CM | POA: Diagnosis not present

## 2019-03-30 DIAGNOSIS — I4891 Unspecified atrial fibrillation: Secondary | ICD-10-CM | POA: Diagnosis not present

## 2019-03-30 DIAGNOSIS — J441 Chronic obstructive pulmonary disease with (acute) exacerbation: Secondary | ICD-10-CM | POA: Diagnosis not present

## 2019-03-30 DIAGNOSIS — M19041 Primary osteoarthritis, right hand: Secondary | ICD-10-CM | POA: Diagnosis not present

## 2019-03-30 DIAGNOSIS — Z853 Personal history of malignant neoplasm of breast: Secondary | ICD-10-CM | POA: Diagnosis not present

## 2019-03-30 DIAGNOSIS — E782 Mixed hyperlipidemia: Secondary | ICD-10-CM | POA: Diagnosis not present

## 2019-03-30 NOTE — Telephone Encounter (Signed)
Attempted to call pt but line just rings and rings and no machine ever kicks in to be able to leave a VM. Will try to call back later.

## 2019-03-30 NOTE — Telephone Encounter (Signed)
See other encounter from 03/30/19.

## 2019-03-31 MED ORDER — STIOLTO RESPIMAT 2.5-2.5 MCG/ACT IN AERS: 2.0000 | INHALATION_SPRAY | Freq: Every day | RESPIRATORY_TRACT | 0 refills | Status: AC

## 2019-03-31 NOTE — Telephone Encounter (Signed)
Spoke with pt. She is needing samples of Stiolto to last until after the new year. These have been left at the front desk for pick up. We are still waiting for her ONO report.  Has this been received? Thanks.

## 2019-03-31 NOTE — Telephone Encounter (Signed)
Pt calling back regarding samples.  [phone] or [phone].

## 2019-03-31 NOTE — Telephone Encounter (Signed)
ATC pt, there was no answer and I could not leave a message. Will try back. 

## 2019-04-04 ENCOUNTER — Telehealth: Payer: Self-pay | Admitting: Emergency Medicine

## 2019-04-04 NOTE — Telephone Encounter (Signed)
Medication name and strength: Stiolto Respimat 2.30mcg Provider: RB Pharmacy: [facility]  Patient insurance ID: [id number] Phone: [phone] Fax:   Was the PA started on CMM?  Yes If yes, please enter the Key: [id number] Timeframe for approval/denial: 1-3 days

## 2019-04-04 NOTE — Telephone Encounter (Signed)
Per [employer], PA has been approved until 03/30/2020. Spoke with pharmacist, he is aware of approval.   Nothing further needed at time of call.

## 2019-04-05 NOTE — Telephone Encounter (Signed)
ONO has been requested from [facility] for the 2nd time. Will await fax.

## 2019-04-06 NOTE — Telephone Encounter (Signed)
mychart message received by pt. Kristin Hood, please see pt's message and advise on it. I have contacted [place] for them to fax pt's OV and test results to our office for review.

## 2019-04-06 NOTE — Telephone Encounter (Signed)
04/06/2019 2226  I am glad the patient is feeling better.  This ultimately is a topic that either Dr. Rex Kras or [name] should be managing.  Specifically in regards to fluid management and diuretics.  I would notify them of this update.  Once we obtain the documentation from [facility] we can also forward to primary care and cardiology.  I would recommend contacting cardiology and primary care directly regarding this update.  Wyn Quaker, FNP

## 2019-04-07 NOTE — Telephone Encounter (Signed)
Ok thank you   Kristin Hood

## 2019-04-07 NOTE — Telephone Encounter (Signed)
Angela from [employer] responded to message. She has emailed me a copy of results. Results have been printed and will be given to Aaron Edelman to read tomorrow.

## 2019-04-07 NOTE — Telephone Encounter (Signed)
Still have not received the ONO fax. Called [employer] was placed on a long hold. No one came to the phone and I was asked to leave a VM. Will send a staff message to Levada Dy at [employer] to see if she can help out in anyway.

## 2019-04-07 NOTE — Telephone Encounter (Signed)
I sent pt the message and routed the message to [name]. I faxed the e-mail thread to Dr. Rex Kras as well.

## 2019-04-11 ENCOUNTER — Telehealth: Payer: Self-pay | Admitting: Pulmonary Disease

## 2019-04-11 DIAGNOSIS — G4734 Idiopathic sleep related nonobstructive alveolar hypoventilation: Secondary | ICD-10-CM

## 2019-04-11 NOTE — Telephone Encounter (Signed)
See encounter from 04/11/19.

## 2019-04-11 NOTE — Telephone Encounter (Signed)
Spoke with patient. She verbalized understanding about O2 order. Will go ahead and place order today since the ONO was done on 03/11/19.  While on the phone, she stated that she had an UC visit last week and was prescribed DuoNebs twice daily. She has not been using the Pulmicort twice daily because the DuoNebs have been working well for her. She stated that this is the best she has felt in weeks. She received the medication from [facility] on [address] but [facility] called her this morning to warn her that the Pulmicort and Duonebs were different medications.   Did explain to her that these are two different medications and the DuoNeb was as needed medication and Pulmicort is a maintenance medication. She verbalized understanding. She wanted to know if Aaron Edelman would be ok with her remaining on the Duonebs. She will need a refill.   Aaron Edelman, please advise. Thanks!

## 2019-04-11 NOTE — Telephone Encounter (Signed)
Start 2L of O2 at night  [place]

## 2019-04-11 NOTE — Telephone Encounter (Signed)
For right now I am okay with patient being maintained on DuoNeb nebulized medication.  Patient needs a follow-up with Dr. [name] in 4 to 6 weeks for this can be further evaluated.  Okay to send 1 refill of duo nebs..  Patient should still remain on Pulmicort.  Pulmicort as an ICS medication works over a 12-hour.  Duo nebs are more short acting.  They do not replace each other.Patient needs to complete cardiology follow-up which looks like a scheduled for 04/15/2019.Please schedule patient with RB in 4 weeks or next available.  Wyn Quaker FNP

## 2019-04-11 NOTE — Telephone Encounter (Signed)
Spoke with patient. She verbalized understanding about the different between Pulmicort and DuoNebs. Offered to call in a refill on the DuoNeb today but she stated that since it is a rescue medication, she will hold off. She does have enough of the Pulmicort.   She has been scheduled for a follow up visit with RB next month.   Nothing further needed at time of call.

## 2019-04-11 NOTE — Telephone Encounter (Signed)
04/11/2019 0843  We have reviewed patient's overnight oximetry results.  03/12/2019-overnight oximetry-on Room Air - duration of sleep 5 hours and 42 minutes, 3 hours and 44 seconds below 88%, SaO2 low 60  Patient qualifies for oxygen  Please place the order today.  Wyn Quaker, FNP

## 2019-04-13 ENCOUNTER — Telehealth: Payer: Self-pay | Admitting: *Deleted

## 2019-04-13 DIAGNOSIS — J449 Chronic obstructive pulmonary disease, unspecified: Secondary | ICD-10-CM | POA: Diagnosis not present

## 2019-04-13 DIAGNOSIS — J441 Chronic obstructive pulmonary disease with (acute) exacerbation: Secondary | ICD-10-CM | POA: Diagnosis not present

## 2019-04-13 NOTE — Telephone Encounter (Signed)
Spoke with patient who reports back around 04/02/2019 she became very SOB.  She has had this occur in the past and went to the ED.  She did not want to do that this time and called [facility].  A NP came out to her house and evaluated her.   She was started on steroids, antibiotics, nebs and instruction to take (2) 40 mg tabs of Furosemide which she did x 1.  Her congestion has been much better.  She has been taking Furosemide 40 mg daily since and would like to know if Dr [name] feels she should continue on this dose.  She reports having an overnight oximetry test which demonstrated her 02 levels dropping during the night while sleeping into the 60s.  She now has oxygen and will be wearing it at night.  She does have an appt on Friday with Dr [name] which she will keep.  She was grateful for the call back and the ability to review her recent experiences.  She is aware I will notify Dr [name] prior to her appt on Friday.

## 2019-04-13 NOTE — Telephone Encounter (Signed)
Attempted to contact patient to discuss her recent c/o SOB and wt gain and what has been done to treat it and to f/u.  Pt states "you are calling at a very bad time" and to call back later.  Advised I will attempt to reach her again by phone this afternoon.

## 2019-04-15 ENCOUNTER — Encounter: Payer: Self-pay | Admitting: Cardiology

## 2019-04-15 ENCOUNTER — Ambulatory Visit: Payer: PPO | Admitting: Cardiology

## 2019-04-15 ENCOUNTER — Other Ambulatory Visit: Payer: Self-pay

## 2019-04-15 VITALS — BP 100/54 | HR 79 | Ht 66.0 in | Wt 197.0 lb

## 2019-04-15 DIAGNOSIS — I10 Essential (primary) hypertension: Secondary | ICD-10-CM | POA: Diagnosis not present

## 2019-04-15 DIAGNOSIS — I272 Pulmonary hypertension, unspecified: Secondary | ICD-10-CM

## 2019-04-15 DIAGNOSIS — Z79899 Other long term (current) drug therapy: Secondary | ICD-10-CM | POA: Diagnosis not present

## 2019-04-15 DIAGNOSIS — I4821 Permanent atrial fibrillation: Secondary | ICD-10-CM

## 2019-04-15 DIAGNOSIS — I5032 Chronic diastolic (congestive) heart failure: Secondary | ICD-10-CM | POA: Diagnosis not present

## 2019-04-15 LAB — BASIC METABOLIC PANEL
BUN/Creatinine Ratio: 35 — ABNORMAL HIGH (ref 12–28)
BUN: 35 mg/dL — ABNORMAL HIGH (ref 8–27)
CO2: 25 mmol/L (ref 20–29)
Calcium: 9.3 mg/dL (ref 8.7–10.3)
Chloride: 102 mmol/L (ref 96–106)
Creatinine, Ser: 1.01 mg/dL — ABNORMAL HIGH (ref 0.57–1.00)
GFR calc Af Amer: 61 mL/min/{1.73_m2} (ref >59)
GFR calc non Af Amer: 53 mL/min/{1.73_m2} — ABNORMAL LOW (ref >59)
Glucose: 95 mg/dL (ref 65–99)
Potassium: 4.2 mmol/L (ref 3.5–5.2)
Sodium: 140 mmol/L (ref 134–144)

## 2019-04-15 NOTE — Telephone Encounter (Signed)
Pt was seen in the office today by Dr [name].  See documentation in OV note for further information.

## 2019-04-15 NOTE — Telephone Encounter (Signed)
Saw in office [name], MD

## 2019-04-15 NOTE — Progress Notes (Signed)
Patient ID: Kristin Hood, female   DOB: Feb 09, 1941, 79 y.o.   MRN: [id number]      [address], [address], [place]  [address] Phone: [phone] Fax:  [phone]  Date:  04/15/2019   ID:  Kristin Hood, DOB 08/16/1940, MRN [id number]  PCP:  [name], MD   History of Present Illness: Kristin Hood is a 79 y.o. female with atrial fibrillation discovered on 08/20/12, chronic anticoagulation, COPD, prior stroke, received TPA for cerebral artery occlusion (could not raise arm-8/13) here for followup. Lives next door to [facility] and [employer] [facility]    Echocardiogram demonstrated mild to moderate left atrial enlargement, normal ejection fraction, mild aortic stenosis, MAC, mild mitral regurgitation.  Had 3 knee replacements.   She underwent catheterization on 08/14/11 which showed no evidence of coronary artery disease. LVEDP was 14 mm mercury.  Went to ER on 12/10/12 had a terrific pain in her chest. Lasted about hour at home BP 159/100, 3 times. Went on to ER. Was worried. Once she got to [facility] she quit hurting. Sent to ER. Wanted to keep her overnight. Did troponin did well. Did OK. Prob GERD.   Went again to [facility]. Afib increased rate. PET scan. 3 nodules one thyroid. Endocrine, no good sample.   Unfortunately, her son-in-law died 2 weeks after neck surgery from pulmonary embolism.  08/27/2014 - Hip surgery. Dr. Wynelle Link. She has had extensive workup including oncology workup for nodules in her lungs, lymph nodes, thyroid nodule. She will be followed up on a yearly basis and oncology. Reassurance. Overall, no chest pain other than mild GERD at night after dinner. No syncope, no change in shortness of breath.  05/16/15-baseline shortness of breath. She did go to the emergency room because of centralized chest discomfort. Troponin was normal, EKG unremarkable. Thankfully she started taking Zantac and this was improved.  11/12/15-reassuring thyroid tissue upon excision.  No cancer. Shortness of breath continues. Baseline. On Symbicort. COPD. Eliquis has been expensive. Trying to get assistance.  2016-08-26 - AFIB, Jonne Ply) died. Needs toe repositioned. Right varicose vein. No significant shortness of breath. Occasionally she will still feel palpitations in the evening hours, around 4 PM. She has taken an extra Inderal at that time which seems to help area she recounted a story where she felt it racing, her grandson asked her if she wanted to go to dinner and she forgot about it and did not have any problems. Once again Dayna and Christell Faith are her neighbor.  05/21/17 - 12/30/16 hospitalization. Combination from heart and lungs. Weight set at 240 when left the hospital.  Now her base weight is 235.  She understands to take an extra Lasix if necessary.  She has been doing a good job on maintaining this weight.  She still is quite deconditioned, debilitated.  Her back and hip pain is playing a major role in this.  She is not having any significant chest discomfort.  Occasionally she will feel heart palpitations from her atrial fibrillation which is permanent.  She has not had any bleeding episodes.  When discussing her orthopedic issues, she understands that she would be high risk for any surgical procedures.  Whether this pertains to her toe or her back she understands.  Creat 1.1. Dr. Emiliano Dyer. Back. Prior stroke   06/04/2018- here for the follow-up of recent hospitalization with acute on chronic diastolic heart failure and hypoxic respiratory failure.  She was diuresed several liters, felt  better.  Lasix was increased to 40 mg a day from 20.  She is maintaining her weight.  Atrial fibrillation stable.  Please see below for details.  Hospitalization records reviewed.  02/10/2019-here for the follow-up of chronic diastolic heart failure.  Prior hospitalization earlier in 2020 with hypoxic respiratory failure and a component of diastolic heart failure.  Her Lasix was increased to 40  mg from 20.  Atrial fibrillation was stable. Since using the nebulizer a day this has helped. This was given at prior hospital stay. Weather changes cause episodes.  Overall his weight is down, stable.  Last creatinine has been in the 1.2-1.4 range.  Stable.  Hemoglobin 10.8 previously.  04/15/2019-here for the follow-up of heart failure type symptoms, shortness of breath.  She had an x-ray done globally that showed findings consistent with congestive heart failure and pulmonary edema on 03/08/2019.  Her pulmonary provider forwarded on to me.  She is taking Lasix 40 mg daily.  She had been doing 40/20, tried 80 briefly then back to 40.  Her weight is down 197 from 203.  Creatinine from outside labs of 0.9 in December 2020.  New inhaler helped.  However January 1 could not breathe, had gained 5 pounds over the week.  Instructed to take 2 Lasix 80.  Plus antibiotics.  Steroid.  Went to the bathroom all day long, lost 5 pounds in 24 hours.  Felt much better.  She change the nebulizer.  This morning does feel some more congestion.  Does not carry fluid in her legs.  Wt Readings from Last 3 Encounters:  04/15/19 197 lb (89.4 kg)  03/09/19 203 lb (92.1 kg)  02/22/19 202 lb 12.8 oz (92 kg)     Past Medical History:  Diagnosis Date  . Acute congestive heart failure ([place])   . Acute diastolic CHF (congestive heart failure) ([place]) 12/30/2016  . Acute respiratory failure with hypoxemia ([place]) 06/09/2016  . Acute respiratory failure with hypoxia ([place]) 12/30/2016  . Anginal pain ([place])   . ARF (acute renal failure) ([place]) 06/11/2016  . Arthritis   . Atrial fibrillation ([place]) 07/2012   Eliquis, rate controlled  . Benign hypertensive heart disease without heart failure 12/25/2012  . Breast cancer ([place]) 2001   left mastectomy  . Chest pain 12/11/2012  . CHF (congestive heart failure) ([place]) 06/10/2016  . Chronic atrial fibrillation ([place]) 11/16/2014  . Chronic diastolic heart failure ([place]) 02/13/2014  . Coarse tremors     , Essential  . Complication of anesthesia    SMALLER TUBE FOR INTUBATION AS GAGS ON TUBE  . COPD (chronic obstructive pulmonary disease) ([place])   . COPD exacerbation ([place]) 12/30/2016  . CVA (cerebral vascular accident) ([place]) 2013  . Dysrhythmia 08/20/2012   Atrial Fibrillation  . Essential and other specified forms of tremor 06/18/2012  . Essential hypertension 06/18/2012  . Family history of adverse reaction to anesthesia    mother experienced pain with intubation   . GERD (gastroesophageal reflux disease)    history of   . Hard of hearing   . Heart murmur   . History of chemotherapy 11/13/1998 to 2010   Adriamycin/Docotaxol, Dexorubicin/Taxotere, Femara, Tamoxifen  . History of CVA (cerebrovascular accident) 12/11/2012  . History of hiatal hernia    history of   . Hx of adenomatous colonic polyps 2006   , Benign  . Hypercholesteremia   . Hyperlipidemia 02/13/2014  . Hypertension    , With mild concentric left ventricular hypertrophy  . Long term  current use of anticoagulant therapy 12/25/2012  . Mixed dyslipidemia   . Mixed hyperlipidemia 12/25/2012  . Morbid obesity ([place]) 06/18/2012  . Phlebitis and thrombophlebitis of superficial vessels of lower extremities 12/25/2012  . 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 Pure hypercholesterolemia 12/25/2012  . Shingles   . Shortness of breath dyspnea    on exertion   . SOB (shortness of breath) 11/16/2014  . Spider veins   . Stroke ([place]) 11/17/2011   left side brain-speech-TPA  . Thyroid nodule   . Tremor, essential   . Unspecified cerebral artery occlusion with cerebral infarction 11/18/2011  . Valvular sclerosis 09/23/2003   without stenosis  . Varicose veins     Past Surgical History:  Procedure Laterality Date  . APPENDECTOMY  07/10/1957  . BIOPSY THYROID  12/01/2013   ultrasound  . BREAST SURGERY    . BUNIONECTOMY  11/29/1988   bilateral with hammer toes  . CARDIAC CATHETERIZATION  07/2010   , Without significant CAD   . CARDIAC CATHETERIZATION  07/2011   [name]  . CESAREAN SECTION    . CESAREAN SECTION  05/1968, 07/1965  . EYE SURGERY  01/31/2013   eye lid; cataract surgery bilat   . JOINT REPLACEMENT  05/2001   left knee  . JOINT REPLACEMENT  11/2001   right knee  . JOINT REPLACEMENT  01/2008   redo right knee  . KNEE SURGERY  2009   ,Total knee replacement  . LEFT HEART CATHETERIZATION WITH CORONARY ANGIOGRAM N/A 08/14/2011   Procedure: LEFT HEART CATHETERIZATION WITH CORONARY ANGIOGRAM;  Surgeon: [name], MD;  Location[facility] C[facility] LAB;  Service: Cardiovascular;  Laterality: N/A;  . Left Rotator Cuff    . MASTECTOMY Left 10/19/1998   Radical, left -with lymph nodes (8 total)  . Reverse bunionectomy  08/29/1993   removal of Tibial Sesamoid-left  . TEE WITHOUT CARDIOVERSION  11/19/2011   Procedure: TRANSESOPHAGEAL ECHOCARDIOGRAM (TEE);  Surgeon: [name], MD;  Location: [facility] ENDOSCOPY;  Service: Cardiovascular;  Laterality: N/A;  . THYROIDECTOMY N/A 03/02/2015   Procedure: TOTAL THYROIDECTOMY;  Surgeon: Armandina Gemma, MD;  Location: [facility] ORS;  Service: General;  Laterality: N/A;  . TONSILLECTOMY    . TOTAL HIP ARTHROPLASTY Left 10/11/2014   Procedure: LEFT TOTAL HIP ARTHROPLASTY ANTERIOR APPROACH;  Surgeon: [name], MD;  Location: [facility] ORS;  Service: Orthopedics;  Laterality: Left;    Current Outpatient Medications  Medication Sig Dispense Refill  . albuterol (PROVENTIL HFA;VENTOLIN HFA) 108 (90 Base) MCG/ACT inhaler Inhale 2 puffs into the lungs every 4 (four) hours as needed for wheezing or shortness of breath. 1 Inhaler 0  . atorvastatin (LIPITOR) 20 MG tablet Take 20 mg by mouth at bedtime.     . budesonide (PULMICORT) 0.25 MG/2ML nebulizer solution Take 2 mLs (0.25 mg total) by nebulization 2 (two) times daily. 60 mL 12  . ELIQUIS 5 MG TABS tablet TAKE 1 TABLET BY MOUTH TWICE A DAY 180 tablet 2  . fenofibrate 160 MG tablet Take 160 mg by mouth at bedtime.     . fluticasone (FLONASE)  50 MCG/ACT nasal spray Place 2 sprays into both nostrils daily as needed for allergies or rhinitis.     . furosemide (LASIX) 40 MG tablet Take 40 mg by mouth daily.    Marland Kitchen guaiFENesin (MUCINEX) 600 MG 12 hr tablet Take 600 mg by mouth daily.    Marland Kitchen levothyroxine (SYNTHROID) 175 MCG tablet TAKE 1 TABLET BY MOUTH EVERY DAY BEFORE BREAKFAST 90 tablet 3  .  loratadine (CLARITIN) 10 MG tablet Take 10 mg by mouth daily as needed for allergies (rash).     . Multiple Vitamin (MULTIVITAMIN WITH MINERALS) TABS tablet Take 1 tablet by mouth daily.     . propranolol ER (INDERAL LA) 60 MG 24 hr capsule TAKE 2 CAPSULES IN THE MORNING & TAKE 1 CAPSULE IN THE AFTERNOON 270 capsule 2  . Tiotropium Bromide Monohydrate (SPIRIVA RESPIMAT) 2.5 MCG/ACT AERS Inhale 2 puffs into the lungs daily. 3 Inhaler 4  . Tiotropium Bromide-Olodaterol (STIOLTO RESPIMAT) 2.5-2.5 MCG/ACT AERS Inhale 2 puffs into the lungs daily. 8 g 0   No current facility-administered medications for this visit.    Allergies:    Allergies  Allergen Reactions  . Floxin [Ofloxacin] Nausea Only and Other (See Comments)    Dizziness, Vertigo     Social History:  The patient  reports that she quit smoking about 27 years ago. Her smoking use included cigarettes. She has a 60.00 pack-year smoking history. She has never used smokeless tobacco. She reports current alcohol use of about 1.0 standard drinks of alcohol per week. She reports that she does not use drugs.   ROS: All other review of systems negative.  Denies any fever chills nausea vomiting syncope  PHYSICAL EXAM: VS:  BP (!) 100/54   Pulse 79   Ht _0  (1.676 m)   Wt 197 lb (89.4 kg)   SpO2 95%   BMI 31.80 kg/m  GEN: Well nourished, well developed, in no acute distress  HEENT: normal  Neck: no JVD, carotid bruits, or masses Cardiac: RRR; 2/6 SM, no rubs, or gallops,no edema  Respiratory:  clear to auscultation bilaterally, normal work of breathing GI: soft, nontender, nondistended, +  BS MS: no deformity or atrophy  Skin: warm and dry, no rash Neuro:  Alert and Oriented x 3, Strength and sensation are intact Psych: euthymic mood, full affect  EKG: No new ECG. ECG from 12/2016 personally reviewed and AFIB 90. 07/31/14-76, atrial fibrillation-no significant change from prior. AFIB 77 No ST changes. Personally viewed  Echocardiogram 10/27/2018:  1. The left ventricle has normal systolic function with an ejection fraction of 60-65%. The cavity size was normal. Left ventricular diastolic function could not be evaluated secondary to atrial fibrillation. Elevated left ventricular end-diastolic  pressure.  2. The right ventricle has normal systolic function. The cavity was normal. There is no increase in right ventricular wall thickness. Right ventricular systolic pressure is moderately elevated.  3. Left atrial size was severely dilated.  4. Right atrial size was severely dilated.  5. The aortic valve is tricuspid. Moderate thickening of the aortic valve. Moderate calcification of the aortic valve. Aortic valve regurgitation is mild by color flow Doppler. Mild stenosis of the aortic valve. Moderate aortic annular calcification  noted.  6. The mitral valve is degenerative. Mild thickening of the mitral valve leaflet. Mild calcification of the mitral valve leaflet. There is moderate to severe mitral annular calcification present. Mild mitral valve stenosis.  7. The aorta is normal in size and structure.  8. The inferior vena cava was dilated in size with <50% respiratory variability.  9. The interatrial septum appears to be lipomatous.    ASSESSMENT AND PLAN:  Chronic diastolic heart failure  -Appreciate communication from [facility].  Chest x-ray report from outside source reviewed showing findings consistent with heart failure and pulmonary edema.  She took her additional Lasix lost 5 pounds with 80.  I would like for her to be  on 40 a day.  We will check a basic  metabolic profile and see how her potassium level is.  She has been taking low-dose potassium occasionally she states.  I will defer to Dr. Rex Kras her primary care physician to provide orders for [facility] if he feels as though it is necessary for her to continue with the service through [facility].  Permanent atrial fibrillation  - Currently well rate controlled. No change in Inderal.  This was previously decreased which is thought to help her with her breathing at a prior hospitalization.  - Occasionally will feel increased heart rate in the evening hours, late afternoon. She takes an extra Inderal if this happens.  No new changes made, stable condition.  History of stroke  - Increases her future stroke risk. Continue with chronic anticoagulation.  No new symptoms.  Continue with current plan.  Continuing with Eliquis.  No bleeding.  Non-Hodgkin's lymphoma -Surveillance by Dr. [name].  Chronic anticoagulation  - She has not had any bleeding issues. Continue with Eliquis.  -We will be checking a new basic metabolic profile.  Prior CBC shows stable hemoglobin.. No bruising.   GERD  - Prior ER visit in January 2017. Reassurance. Prior cath reassuring.  Stable.  Aortic atherosclerosis  - statin, HTN control.  No changes continue with these efforts.  Continue with exercise.  Obesity, morbid (BMI >35 with 2 or more comorbidities)  - Continue to encourage weight loss.  Has done a good job with this.  Exercise.   Secondary pulmonary hypertension  - from morbid obesity, Noted on ECHO. Tx HTN.  No changes, continue with current therapy  Varicose veins  - She has seen Dr. Oneida Alar in the past.  No changes made.  Feet can be cold at times.  I was able to palpate a posterior tibial pulse.  Essential hypertension  - Doing well.  Continues to be well controlled.  At prior visit was grieving the loss of her husband, Clair Gulling   - She is very appreciative of Melina Copa and Thurmond Butts her neighbor who helped  significantly. Still difficult for her at times.   75-monthfollow-up with LMickel Baasvirtually.  Signed, MCandee Furbish MD [facility] 04/15/2019 9:08 AM

## 2019-04-15 NOTE — Patient Instructions (Signed)
Medication Instructions:  Continue on Furosemide 40 mg daily.  Continue all other medications as listed.  *If you need a refill on your cardiac medications before your next appointment, please call your pharmacy*  Lab Work: Please have lab work today (BMP)  If you have labs (blood work) drawn today and your tests are completely normal, you will receive your results only by: Marland Kitchen MyChart Message (if you have MyChart) OR . A paper copy in the mail If you have any lab test that is abnormal or we need to change your treatment, we will call you to review the results.  Follow-Up: At [facility], you and your health needs are our priority.  As part of our continuing mission to provide you with exceptional heart care, we have created designated Provider Care Teams.  These Care Teams include your primary Cardiologist (physician) and Advanced Practice Providers (APPs -  Physician Assistants and Nurse Practitioners) who all work together to provide you with the care you need, when you need it.  Your next appointment:   3 month(s)  The format for your next appointment:   Either In Person or Virtual  Provider:   Cecilie Kicks, NP  Thank you for choosing [facility]!!

## 2019-04-19 ENCOUNTER — Telehealth: Payer: Self-pay

## 2019-04-19 NOTE — Telephone Encounter (Signed)
Samples sent back to refill, never picked up

## 2019-04-27 DIAGNOSIS — J441 Chronic obstructive pulmonary disease with (acute) exacerbation: Secondary | ICD-10-CM | POA: Diagnosis not present

## 2019-04-27 DIAGNOSIS — Z853 Personal history of malignant neoplasm of breast: Secondary | ICD-10-CM | POA: Diagnosis not present

## 2019-04-27 DIAGNOSIS — I4891 Unspecified atrial fibrillation: Secondary | ICD-10-CM | POA: Diagnosis not present

## 2019-04-27 DIAGNOSIS — I10 Essential (primary) hypertension: Secondary | ICD-10-CM | POA: Diagnosis not present

## 2019-04-27 DIAGNOSIS — E782 Mixed hyperlipidemia: Secondary | ICD-10-CM | POA: Diagnosis not present

## 2019-04-27 DIAGNOSIS — I509 Heart failure, unspecified: Secondary | ICD-10-CM | POA: Diagnosis not present

## 2019-04-27 DIAGNOSIS — M19041 Primary osteoarthritis, right hand: Secondary | ICD-10-CM | POA: Diagnosis not present

## 2019-04-27 DIAGNOSIS — E890 Postprocedural hypothyroidism: Secondary | ICD-10-CM | POA: Diagnosis not present

## 2019-04-27 DIAGNOSIS — I5032 Chronic diastolic (congestive) heart failure: Secondary | ICD-10-CM | POA: Diagnosis not present

## 2019-04-28 ENCOUNTER — Other Ambulatory Visit: Payer: Self-pay | Admitting: Cardiology

## 2019-04-28 MED ORDER — FUROSEMIDE 40 MG PO TABS: 40.0000 mg | ORAL_TABLET | Freq: Every day | ORAL | 3 refills | Status: DC

## 2019-04-28 NOTE — Telephone Encounter (Signed)
Pt's medication was sent to pt's pharmacy as requested. Confirmation received.  °

## 2019-05-09 ENCOUNTER — Ambulatory Visit: Payer: PPO | Admitting: Emergency Medicine

## 2019-05-10 ENCOUNTER — Ambulatory Visit: Payer: PPO | Admitting: Emergency Medicine

## 2019-05-10 ENCOUNTER — Encounter: Payer: Self-pay | Admitting: Emergency Medicine

## 2019-05-10 ENCOUNTER — Other Ambulatory Visit: Payer: Self-pay

## 2019-05-10 DIAGNOSIS — I5032 Chronic diastolic (congestive) heart failure: Secondary | ICD-10-CM | POA: Diagnosis not present

## 2019-05-10 DIAGNOSIS — J449 Chronic obstructive pulmonary disease, unspecified: Secondary | ICD-10-CM

## 2019-05-10 DIAGNOSIS — J301 Allergic rhinitis due to pollen: Secondary | ICD-10-CM | POA: Diagnosis not present

## 2019-05-10 DIAGNOSIS — G4734 Idiopathic sleep related nonobstructive alveolar hypoventilation: Secondary | ICD-10-CM | POA: Diagnosis not present

## 2019-05-10 DIAGNOSIS — C8580 Other specified types of non-Hodgkin lymphoma, unspecified site: Secondary | ICD-10-CM | POA: Diagnosis not present

## 2019-05-10 NOTE — Assessment & Plan Note (Signed)
She had flaring symptoms in December, was started on DuoNeb and then Pulmicort nebs.  Subsequently changed to Stiolto and Pulmicort.  She is doing well on this regimen.  She has averaged about 1 exacerbation annually and I think she will benefit from the ICS.  Plan to continue Stiolto, Pulmicort nebs, albuterol as needed.  We can stop the DuoNeb for now.  Her vaccinations are all up-to-date including COVID-19 vaccine.

## 2019-05-10 NOTE — Assessment & Plan Note (Signed)
She is using Benadryl and Mucinex reliably, uses Flonase and loratadine as needed.  Continue this regimen

## 2019-05-10 NOTE — Patient Instructions (Addendum)
Please continue Stiolto 2 puffs once daily. Continue budesonide (Pulmicort) nebulizer treatments twice a day every day on a schedule.  Rinse and gargle after you use this medicine. Keep your albuterol available to use 2 puffs if needed for shortness of breath, chest tightness, wheezing. You do not need to continue albuterol/ipratropium nebulizer treatments at this time Continue your oxygen at 2 L/min at night while sleeping Continue your Benadryl, Mucinex as you have been taking them Continue to keep your Flonase nasal spray and loratadine (Claritin) available to use as needed Continue your Lasix dosing as recommended by [name] Flu shot, pneumonia shot, COVID-19 shots all up-to-date Follow with Dr [name] in 4 months or sooner if you have any problems.

## 2019-05-10 NOTE — Progress Notes (Signed)
Subjective:    Patient ID: LAVENDER COONE, female    DOB: 16-Jan-1941, 79 y.o.   MRN: [id number]  Shortness of Breath Pertinent negatives include no ear pain, fever, headaches, leg swelling, rash, rhinorrhea, sore throat, vomiting or wheezing.   ROV 05/18/2018 --79 year old woman with history of breast cancer, lymphadenopathy that is been felt to likely be a low-grade lymphoma.  She is been followed by Dr. [name] and has been on observation.  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 They are planning to follow off therapy for now.  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 She also has A fib, dCHF. She was just admitted for acute dyspnea, diastolic CHF exacerbation. She is much better with diuresis. She remains on Spiriva - sometimes forgets it. She is unsure whether she benefits from it. She uses albuterol about once a week - usually for dyspnea. It does seem to help her.   ROV 05/10/2019 -- 79 year old woman with COPD, bronchomalacia with associated cough.  She also has a history of atrial fibrillation, hypertension with diastolic CHF, is followed by Dr. 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   Since last time she was seen by me she has been seen in our office by Wyn Quaker, has had her Lasix adjusted.  She follows with [name] for her diastolic CHF and is now taking 40mg  daily, adding an additional dose later in the day depending on her wt > if she gains 1 lb and feels any dyspnea.  Also her bronchodilator regimen has been adjusted, now on Stiolto + pulmicort nebs bid. She had duoneb before she got the stiolto - now off. Has albuterol and uses rarely. She was also started on 2L/min nocturnal O2 based on ONO. Has not seen any exertional desats.  On flonase, loratadine prn, benadryl. mucinex bid  Continues to follow with Dr [name], repeat Ct  chest and abd due in May.  Flu shot up to date, PNA shot up to date. She has had both both COVID shots       Review of Systems  Constitutional: Negative for fever and unexpected weight change.  HENT: Positive for congestion and postnasal drip. Negative for dental problem, ear pain, nosebleeds, rhinorrhea, sinus pressure, sneezing, sore throat and trouble swallowing.   Eyes: Negative for redness and itching.  Respiratory: Positive for shortness of breath. Negative for cough, chest tightness and wheezing.   Cardiovascular: Negative for palpitations and leg swelling.  Gastrointestinal: Negative for nausea and vomiting.  Genitourinary: Negative for dysuria.  Musculoskeletal: Negative for joint swelling.  Skin: Negative for rash.  Neurological: Negative for headaches.  Hematological: Does not bruise/bleed easily.  Psychiatric/Behavioral: Negative for dysphoric mood. The patient is not nervous/anxious.        Objective:   Physical Exam Vitals:   05/10/19 1433  BP: 128/88  Pulse: 80  SpO2: 95%  Weight: 199 lb (90.3 kg)  Height: 5\' 5"  (1.651 m)   Gen: Pleasant, overwt, in no distress,  normal affect  ENT: No lesions,  mouth clear,  oropharynx clear, no postnasal drip  Neck: No JVD, no stridor  Lungs: No use of accessory muscles, no crackles or wheeze, decreased at bases  Cardiovascular: RRR, early syst M 3/6, intact S2. No edema.   Musculoskeletal: No deformities, no cyanosis or clubbing  Neuro: alert, non focal, some tremor and tremulous speech.   Skin:  Warm, no lesions or rash      Assessment & Plan:  COPD mixed type [facility]) She had flaring symptoms in December, was started on DuoNeb and then Pulmicort nebs.  Subsequently changed to Stiolto and Pulmicort.  She is doing well on this regimen.  She has averaged about 1 exacerbation annually and I think she will benefit from the ICS.  Plan to continue Stiolto, Pulmicort nebs, albuterol as needed.  We can stop the DuoNeb for now.   Her vaccinations are all up-to-date including COVID-19 vaccine.  Allergic rhinitis She is using Benadryl and Mucinex reliably, uses Flonase and loratadine as needed.  Continue this regimen  Chronic diastolic CHF (congestive heart failure) ([place]) Now on a stable diuretic regimen.  She is using 40 mg daily and adding a second dose in the afternoon if her weight goes up by 1 pound, feels any dyspnea.  This is working for her.  She follows reliably with [name].  Marginal zone lymphoma (HCC) Followed by Dr. [name].  Next CT imaging in May 2021.  Currently on observation.  Nocturnal hypoxemia Multifactorial, likely due to COPD, her history of diastolic CHF and also obesity.  She has not desaturated during the day.  We will follow her for any evidence of desaturation and plan for supplemental oxygen with exertion if indicated.  Baltazar Apo, MD, PhD 05/10/2019, 2:59 PM [facility] [phone] or if no answer [phone]

## 2019-05-10 NOTE — Assessment & Plan Note (Signed)
Now on a stable diuretic regimen.  She is using 40 mg daily and adding a second dose in the afternoon if her weight goes up by 1 pound, feels any dyspnea.  This is working for her.  She follows reliably with [name].

## 2019-05-10 NOTE — Assessment & Plan Note (Signed)
Followed by Dr. [name].  Next CT imaging in May 2021.  Currently on observation.

## 2019-05-10 NOTE — Assessment & Plan Note (Signed)
Multifactorial, likely due to COPD, her history of diastolic CHF and also obesity.  She has not desaturated during the day.  We will follow her for any evidence of desaturation and plan for supplemental oxygen with exertion if indicated.

## 2019-05-14 DIAGNOSIS — J441 Chronic obstructive pulmonary disease with (acute) exacerbation: Secondary | ICD-10-CM | POA: Diagnosis not present

## 2019-05-14 DIAGNOSIS — J449 Chronic obstructive pulmonary disease, unspecified: Secondary | ICD-10-CM | POA: Diagnosis not present

## 2019-05-26 DIAGNOSIS — J441 Chronic obstructive pulmonary disease with (acute) exacerbation: Secondary | ICD-10-CM | POA: Diagnosis not present

## 2019-05-26 DIAGNOSIS — I5032 Chronic diastolic (congestive) heart failure: Secondary | ICD-10-CM | POA: Diagnosis not present

## 2019-05-26 DIAGNOSIS — E782 Mixed hyperlipidemia: Secondary | ICD-10-CM | POA: Diagnosis not present

## 2019-05-26 DIAGNOSIS — I10 Essential (primary) hypertension: Secondary | ICD-10-CM | POA: Diagnosis not present

## 2019-05-26 DIAGNOSIS — J449 Chronic obstructive pulmonary disease, unspecified: Secondary | ICD-10-CM | POA: Diagnosis not present

## 2019-05-26 DIAGNOSIS — Z853 Personal history of malignant neoplasm of breast: Secondary | ICD-10-CM | POA: Diagnosis not present

## 2019-05-26 DIAGNOSIS — I4891 Unspecified atrial fibrillation: Secondary | ICD-10-CM | POA: Diagnosis not present

## 2019-05-26 DIAGNOSIS — I509 Heart failure, unspecified: Secondary | ICD-10-CM | POA: Diagnosis not present

## 2019-05-26 DIAGNOSIS — M19041 Primary osteoarthritis, right hand: Secondary | ICD-10-CM | POA: Diagnosis not present

## 2019-05-26 DIAGNOSIS — E890 Postprocedural hypothyroidism: Secondary | ICD-10-CM | POA: Diagnosis not present

## 2019-06-03 ENCOUNTER — Telehealth: Payer: Self-pay | Admitting: Emergency Medicine

## 2019-06-03 NOTE — Telephone Encounter (Signed)
RB please look out for paperwork.

## 2019-06-06 NOTE — Telephone Encounter (Signed)
Papers completed

## 2019-06-06 NOTE — Telephone Encounter (Signed)
Forms have been returned to me and have been faxed to [facility]

## 2019-06-08 NOTE — Telephone Encounter (Signed)
Discussed with Kristin Hood who recommended that patient call [employer] since that is who submits the claim to insurance.  E-mail sent to patient with number she can call.

## 2019-06-09 DIAGNOSIS — E890 Postprocedural hypothyroidism: Secondary | ICD-10-CM | POA: Diagnosis not present

## 2019-06-09 DIAGNOSIS — R7301 Impaired fasting glucose: Secondary | ICD-10-CM | POA: Diagnosis not present

## 2019-06-09 DIAGNOSIS — Z8673 Personal history of transient ischemic attack (TIA), and cerebral infarction without residual deficits: Secondary | ICD-10-CM | POA: Diagnosis not present

## 2019-06-09 DIAGNOSIS — N183 Chronic kidney disease, stage 3 unspecified: Secondary | ICD-10-CM | POA: Diagnosis not present

## 2019-06-09 DIAGNOSIS — Z8572 Personal history of non-Hodgkin lymphomas: Secondary | ICD-10-CM | POA: Diagnosis not present

## 2019-06-09 DIAGNOSIS — R251 Tremor, unspecified: Secondary | ICD-10-CM | POA: Diagnosis not present

## 2019-06-09 DIAGNOSIS — I10 Essential (primary) hypertension: Secondary | ICD-10-CM | POA: Diagnosis not present

## 2019-06-09 DIAGNOSIS — I4891 Unspecified atrial fibrillation: Secondary | ICD-10-CM | POA: Diagnosis not present

## 2019-06-09 DIAGNOSIS — I5032 Chronic diastolic (congestive) heart failure: Secondary | ICD-10-CM | POA: Diagnosis not present

## 2019-06-09 DIAGNOSIS — R8290 Unspecified abnormal findings in urine: Secondary | ICD-10-CM | POA: Diagnosis not present

## 2019-06-09 DIAGNOSIS — Z0000 Encounter for general adult medical examination without abnormal findings: Secondary | ICD-10-CM | POA: Diagnosis not present

## 2019-06-09 DIAGNOSIS — J449 Chronic obstructive pulmonary disease, unspecified: Secondary | ICD-10-CM | POA: Diagnosis not present

## 2019-06-11 DIAGNOSIS — J441 Chronic obstructive pulmonary disease with (acute) exacerbation: Secondary | ICD-10-CM | POA: Diagnosis not present

## 2019-06-11 DIAGNOSIS — J449 Chronic obstructive pulmonary disease, unspecified: Secondary | ICD-10-CM | POA: Diagnosis not present

## 2019-06-17 ENCOUNTER — Telehealth: Payer: Self-pay | Admitting: Emergency Medicine

## 2019-06-17 MED ORDER — ALBUTEROL SULFATE HFA 108 (90 BASE) MCG/ACT IN AERS: 2.0000 | INHALATION_SPRAY | RESPIRATORY_TRACT | 11 refills | Status: AC | PRN

## 2019-06-17 NOTE — Telephone Encounter (Signed)
Albuterol inhaler refill has been sent to pt's preferred pharmacy. Called and spoke with pt letting her know this had been done and she verbalized understanding. Nothing further needed.

## 2019-06-21 DIAGNOSIS — I10 Essential (primary) hypertension: Secondary | ICD-10-CM | POA: Diagnosis not present

## 2019-06-21 DIAGNOSIS — Z853 Personal history of malignant neoplasm of breast: Secondary | ICD-10-CM | POA: Diagnosis not present

## 2019-06-21 DIAGNOSIS — I4891 Unspecified atrial fibrillation: Secondary | ICD-10-CM | POA: Diagnosis not present

## 2019-06-21 DIAGNOSIS — M19041 Primary osteoarthritis, right hand: Secondary | ICD-10-CM | POA: Diagnosis not present

## 2019-06-21 DIAGNOSIS — N183 Chronic kidney disease, stage 3 unspecified: Secondary | ICD-10-CM | POA: Diagnosis not present

## 2019-06-21 DIAGNOSIS — J449 Chronic obstructive pulmonary disease, unspecified: Secondary | ICD-10-CM | POA: Diagnosis not present

## 2019-06-21 DIAGNOSIS — E890 Postprocedural hypothyroidism: Secondary | ICD-10-CM | POA: Diagnosis not present

## 2019-06-21 DIAGNOSIS — I272 Pulmonary hypertension, unspecified: Secondary | ICD-10-CM | POA: Diagnosis not present

## 2019-06-21 DIAGNOSIS — E782 Mixed hyperlipidemia: Secondary | ICD-10-CM | POA: Diagnosis not present

## 2019-06-21 DIAGNOSIS — I5032 Chronic diastolic (congestive) heart failure: Secondary | ICD-10-CM | POA: Diagnosis not present

## 2019-06-21 DIAGNOSIS — I509 Heart failure, unspecified: Secondary | ICD-10-CM | POA: Diagnosis not present

## 2019-06-21 DIAGNOSIS — J441 Chronic obstructive pulmonary disease with (acute) exacerbation: Secondary | ICD-10-CM | POA: Diagnosis not present

## 2019-07-07 ENCOUNTER — Encounter: Payer: Self-pay | Admitting: Internal Medicine

## 2019-07-10 NOTE — Progress Notes (Signed)
Telehealth Visit     Virtual Visit via Telephone Note   This visit type was conducted due to national recommendations for restrictions regarding the COVID-19 Pandemic (e.g. social distancing) in an effort to limit this patient's exposure and mitigate transmission in our community.  Due to her co-morbid illnesses, this patient is at least at moderate risk for complications without adequate follow up.  This format is felt to be most appropriate for this patient at this time.  The patient did not have access to video technology/had technical difficulties with video requiring transitioning to audio format only (telephone).  All issues noted in this document were discussed and addressed.  No physical exam could be performed with this format.  Please refer to the patient's chart for her  consent to telehealth for [facility].   Evaluation Performed:  Follow-up visit  This visit type was conducted due to national recommendations for restrictions regarding the COVID-19 Pandemic (e.g. social distancing).  This format is felt to be most appropriate for this patient at this time.  All issues noted in this document were discussed and addressed.  No physical exam was performed (except for noted visual exam findings with Video Visits).  Please refer to the patient's chart (MyChart message for video visits and phone note for telephone visits) for the patient's consent to telehealth for [facility].  Date:  07/12/2019   ID:  Kristin Hood, DOB 1940-05-02, MRN [id number]  Patient Location:  Home  Provider location:   Home  PCP:  [name], MD  Cardiologist:  [name], MD  Electrophysiologist:  None   Chief Complaint:  Follow up  History of Present Illness:    Kristin Hood is a 79 y.o. female who presents via audio/video conferencing for a telehealth visit today.  Seen for [name].   She has a history of persistent AF - discovered in 2014 - on chronic anticoagulation, COPD, prior  stroke, & prior treatment of TPA for cerebral artery occlusion. Has had mild AS noted on prior echo along with LAE. She had remote cath in 2013 - no evidence of CAD. She has had multiple lymph nodes - has had extensive oncology work up. She has tended to have atypical chest pain and palpitations. Admitted with diastolic HF in [id number] and in early 2020. She has become very deconditioned and debilitated and limited by back pain.   Last seen in January by [name] - had had CXR from December with CHF and pulmonary edema. Had diuretics increased for short period. Also using nebs.   The patient does not have symptoms concerning for COVID-19 infection (fever, chills, cough, or new shortness of breath).   Seen today by telephone call. Declined Caregility video. She has consented for this visit. She notes several concerns today. She was very worried that I had had COVID. She has had her vaccines. Primary issue is mucus production - to the point it makes her short of breath and gags. It is not getting worse. Seems variable with the weather. She has continued to have some chest pain. Nothing that has lasted over an hour - she says she does not worry about this anymore after talking with [name] in the past about this. She continues to see Pulmonary - she is on oxygen at night. Her sats are typically above 90. She knows that she will have some bad days - can't breathe - tries to not panic as much now since she has a routine with nebulizers,  extra diuretics prn and [employer] services. This has helped her tremendously. She has not been to the hospital recently with this routine. The last 3 days have been very good for her.     Past Medical History:  Diagnosis Date  . Acute congestive heart failure ([place])   . Acute diastolic CHF (congestive heart failure) ([place]) 12/30/2016  . Acute respiratory failure with hypoxemia ([place]) 06/09/2016  . Acute respiratory failure with hypoxia ([place]) 12/30/2016  . Anginal pain ([place])   .  ARF (acute renal failure) ([place]) 06/11/2016  . Arthritis   . Atrial fibrillation ([place]) 07/2012   Eliquis, rate controlled  . Benign hypertensive heart disease without heart failure 12/25/2012  . Breast cancer ([place]) 2001   left mastectomy  . Chest pain 12/11/2012  . CHF (congestive heart failure) ([place]) 06/10/2016  . Chronic atrial fibrillation ([place]) 11/16/2014  . Chronic diastolic heart failure ([place]) 02/13/2014  . Coarse tremors    , Essential  . Complication of anesthesia    SMALLER TUBE FOR INTUBATION AS GAGS ON TUBE  . COPD (chronic obstructive pulmonary disease) ([place])   . COPD exacerbation ([place]) 12/30/2016  . CVA (cerebral vascular accident) ([place]) 2013  . Dysrhythmia 08/20/2012   Atrial Fibrillation  . Essential and other specified forms of tremor 06/18/2012  . Essential hypertension 06/18/2012  . Family history of adverse reaction to anesthesia    mother experienced pain with intubation   . GERD (gastroesophageal reflux disease)    history of   . Hard of hearing   . Heart murmur   . History of chemotherapy 11/13/1998 to 2010   Adriamycin/Docotaxol, Dexorubicin/Taxotere, Femara, Tamoxifen  . History of CVA (cerebrovascular accident) 12/11/2012  . History of hiatal hernia    history of   . Hx of adenomatous colonic polyps 2006   , Benign  . Hypercholesteremia   . Hyperlipidemia 02/13/2014  . Hypertension    , With mild concentric left ventricular hypertrophy  . Long term current use of anticoagulant therapy 12/25/2012  . Mixed dyslipidemia   . Mixed hyperlipidemia 12/25/2012  . Morbid obesity ([place]) 06/18/2012  . Phlebitis and thrombophlebitis of superficial vessels of lower extremities 12/25/2012  . 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 Pure hypercholesterolemia 12/25/2012  . Shingles   . Shortness of breath dyspnea    on exertion   . SOB (shortness of breath) 11/16/2014  . Spider veins   . Stroke ([place]) 11/17/2011   left side brain-speech-TPA  . Thyroid nodule   . Tremor,  essential   . Unspecified cerebral artery occlusion with cerebral infarction 11/18/2011  . Valvular sclerosis 09/23/2003   without stenosis  . Varicose veins    Past Surgical History:  Procedure Laterality Date  . APPENDECTOMY  07/10/1957  . BIOPSY THYROID  12/01/2013   ultrasound  . BREAST SURGERY    . BUNIONECTOMY  11/29/1988   bilateral with hammer toes  . CARDIAC CATHETERIZATION  07/2010   , Without significant CAD  . CARDIAC CATHETERIZATION  07/2011   [name]  . CESAREAN SECTION    . CESAREAN SECTION  05/1968, 07/1965  . EYE SURGERY  01/31/2013   eye lid; cataract surgery bilat   . JOINT REPLACEMENT  05/2001   left knee  . JOINT REPLACEMENT  11/2001   right knee  . JOINT REPLACEMENT  01/2008   redo right knee  . KNEE SURGERY  2009   ,Total knee replacement  . LEFT HEART CATHETERIZATION WITH CORONARY ANGIOGRAM N/A 08/14/2011   Procedure:  LEFT HEART CATHETERIZATION WITH CORONARY ANGIOGRAM;  Surgeon: [name], MD;  Location: [facility] CATH LAB;  Service: Cardiovascular;  Laterality: N/A;  . Left Rotator Cuff    . MASTECTOMY Left 10/19/1998   Radical, left -with lymph nodes (8 total)  . Reverse bunionectomy  08/29/1993   removal of Tibial Sesamoid-left  . TEE WITHOUT CARDIOVERSION  11/19/2011   Procedure: TRANSESOPHAGEAL ECHOCARDIOGRAM (TEE);  Surgeon: [name], MD;  Location: [facility] ENDOSCOPY;  Service: Cardiovascular;  Laterality: N/A;  . THYROIDECTOMY N/A 03/02/2015   Procedure: TOTAL THYROIDECTOMY;  Surgeon: Armandina Gemma, MD;  Location: [facility] ORS;  Service: General;  Laterality: N/A;  . TONSILLECTOMY    . TOTAL HIP ARTHROPLASTY Left 10/11/2014   Procedure: LEFT TOTAL HIP ARTHROPLASTY ANTERIOR APPROACH;  Surgeon: [name], MD;  Location: [facility] ORS;  Service: Orthopedics;  Laterality: Left;     Current Meds  Medication Sig  . albuterol (VENTOLIN HFA) 108 (90 Base) MCG/ACT inhaler Inhale 2 puffs into the lungs every 4 (four) hours as needed for wheezing or shortness of  breath.  Marland Kitchen atorvastatin (LIPITOR) 20 MG tablet Take 20 mg by mouth at bedtime.   . budesonide (PULMICORT) 0.5 MG/2ML nebulizer solution Take 0.5 mg by nebulization 2 (two) times daily.  Marland Kitchen ELIQUIS 5 MG TABS tablet TAKE 1 TABLET BY MOUTH TWICE A DAY  . fenofibrate 160 MG tablet Take 160 mg by mouth at bedtime.   . fluticasone (FLONASE) 50 MCG/ACT nasal spray Place 2 sprays into both nostrils daily as needed for allergies or rhinitis.   . furosemide (LASIX) 40 MG tablet Take 1 tablet (40 mg total) by mouth daily.  Marland Kitchen guaiFENesin (MUCINEX) 600 MG 12 hr tablet Take 600 mg by mouth daily.  Marland Kitchen levothyroxine (SYNTHROID) 175 MCG tablet TAKE 1 TABLET BY MOUTH EVERY DAY BEFORE BREAKFAST  . loratadine (CLARITIN) 10 MG tablet Take 10 mg by mouth daily as needed for allergies (rash).   . Multiple Vitamin (MULTIVITAMIN WITH MINERALS) TABS tablet Take 1 tablet by mouth daily.   . propranolol ER (INDERAL LA) 60 MG 24 hr capsule TAKE 2 CAPSULES IN THE MORNING & TAKE 1 CAPSULE IN THE AFTERNOON  . Tiotropium Bromide-Olodaterol (STIOLTO RESPIMAT) 2.5-2.5 MCG/ACT AERS Inhale 2 puffs into the lungs daily.     Allergies:   Floxin [ofloxacin]   Social History   Tobacco Use  . Smoking status: Former Smoker    Packs/day: 2.00    Years: 30.00    Pack years: 60.00    Types: Cigarettes    Quit date: 01/30/1992    Years since quitting: 27.4  . Smokeless tobacco: Never Used  Substance Use Topics  . Alcohol use: Yes    Alcohol/week: 1.0 standard drinks    Types: 1 Standard drinks or equivalent per week    Comment: wine occassionally  . Drug use: No     Family Hx: The patient's family history includes Aortic stenosis in her mother; Heart attack in her father; Heart disease in her father and mother; Heart failure in her father.  ROS:   Please see the history of present illness.   All other systems reviewed are negative.    Objective:    Vital Signs:  BP 109/64   Pulse 79   Wt 192 lb (87.1 kg)   BMI 31.95  kg/m    Wt Readings from Last 3 Encounters:  07/12/19 192 lb (87.1 kg)  05/10/19 199 lb (90.3 kg)  04/15/19 197 lb (89.4 kg)    Alert  female in no acute distress. She is appropriate with conversation.    Labs/Other Tests and Data Reviewed:    Lab Results  Component Value Date   WBC 6.9 03/09/2019   HGB 12.3 03/09/2019   HCT 37.5 03/09/2019   PLT 338.0 03/09/2019   GLUCOSE 95 04/15/2019   CHOL 148 11/18/2011   TRIG 101 11/18/2011   HDL 34 (L) 11/18/2011   LDLCALC 94 11/18/2011   ALT 15 02/22/2019   AST 28 02/22/2019   NA 140 04/15/2019   K 4.2 04/15/2019   CL 102 04/15/2019   CREATININE 1.01 (H) 04/15/2019   BUN 35 (H) 04/15/2019   CO2 25 04/15/2019   TSH 1.827 10/28/2018   INR 1.6 (H) 10/23/2018   HGBA1C 5.0 11/18/2011        BNP (last 3 results) Recent Labs    10/24/18 0354  BNP 498.3*    ProBNP (last 3 results) Recent Labs    03/09/19 1215  PROBNP 597.0*      Prior CV studies:    The following studies were reviewed today:  Echocardiogram 10/27/2018: 1. The left ventricle has normal systolic function with an ejection fraction of 60-65%. The cavity size was normal. Left ventricular diastolic function could not be evaluated secondary to atrial fibrillation. Elevated left ventricular end-diastolic  pressure. 2. The right ventricle has normal systolic function. The cavity was normal. There is no increase in right ventricular wall thickness. Right ventricular systolic pressure is moderately elevated. 3. Left atrial size was severely dilated. 4. Right atrial size was severely dilated. 5. The aortic valve is tricuspid. Moderate thickening of the aortic valve. Moderate calcification of the aortic valve. Aortic valve regurgitation is mild by color flow Doppler. Mild stenosis of the aortic valve. Moderate aortic annular calcification  noted. 6. The mitral valve is degenerative. Mild thickening of the mitral valve leaflet. Mild calcification of the  mitral valve leaflet. There is moderate to severe mitral annular calcification present. Mild mitral valve stenosis. 7. The aorta is normal in size and structure. 8. The inferior vena cava was dilated in size with <50% respiratory variability. 9. The interatrial septum appears to be lipomatous.     ASSESSMENT & PLAN:    1. Chronic diastolic HF - seems to be at her baseline - she now has a good regimen with extra diuretic therapy for weight gain over 2 pounds and the assistance from [employer].   2. Persistent AF - rate sounds like it is ok. She is on Eliquis. CBC from December is ok.   3. COPD/chronic shortness of breath - now on night time oxygen. Followed by Pulmonary.   4. Chronic chest pain - seems to be at her baseline. Prior cath from 2013 without CAD noted.   5. Non Hodgkin's lymphoma - not discussed.   6. Prior stroke - remains on Eliquis  7. Chronic anticoagulation - no problems noted.   8. Obesity - do not see this really changing going forward.   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   10. HTN - BP ok today.   48.  COVID-19 Education: The signs and symptoms of COVID-19 were discussed with the patient and how to seek care for testing (follow up with PCP or arrange E-visit).  The importance of social distancing, staying at home, hand hygiene and wearing a mask when out in public were discussed today. She has had both vaccines.   Patient Risk:   After full review of this patient's clinical  status, I feel that they are at least moderate risk at this time.  Time:   Today, I have spent 9 minutes with the patient with telehealth technology discussing the above issues.     Medication Adjustments/Labs and Tests Ordered: Current medicines are reviewed at length with the patient today.  Concerns regarding medicines are outlined above.   Tests Ordered: No orders of the defined types were placed in this encounter.   Medication Changes: No orders of the  defined types were placed in this encounter.   Disposition:  FU with [name] in 3 months.    Patient is agreeable to this plan and will call if any problems develop in the interim.   Amie Critchley, NP  07/12/2019 2:45 PM    [facility]

## 2019-07-12 ENCOUNTER — Telehealth: Payer: Self-pay | Admitting: *Deleted

## 2019-07-12 ENCOUNTER — Other Ambulatory Visit: Payer: Self-pay

## 2019-07-12 ENCOUNTER — Telehealth (INDEPENDENT_AMBULATORY_CARE_PROVIDER_SITE_OTHER): Payer: PPO | Admitting: Nurse Practitioner

## 2019-07-12 ENCOUNTER — Encounter: Payer: Self-pay | Admitting: Nurse Practitioner

## 2019-07-12 VITALS — BP 109/64 | HR 79 | Wt 192.0 lb

## 2019-07-12 DIAGNOSIS — I110 Hypertensive heart disease with heart failure: Secondary | ICD-10-CM

## 2019-07-12 DIAGNOSIS — Z7901 Long term (current) use of anticoagulants: Secondary | ICD-10-CM

## 2019-07-12 DIAGNOSIS — Z7189 Other specified counseling: Secondary | ICD-10-CM | POA: Diagnosis not present

## 2019-07-12 DIAGNOSIS — I4821 Permanent atrial fibrillation: Secondary | ICD-10-CM | POA: Diagnosis not present

## 2019-07-12 DIAGNOSIS — J441 Chronic obstructive pulmonary disease with (acute) exacerbation: Secondary | ICD-10-CM | POA: Diagnosis not present

## 2019-07-12 DIAGNOSIS — I5032 Chronic diastolic (congestive) heart failure: Secondary | ICD-10-CM | POA: Diagnosis not present

## 2019-07-12 DIAGNOSIS — J449 Chronic obstructive pulmonary disease, unspecified: Secondary | ICD-10-CM | POA: Diagnosis not present

## 2019-07-12 DIAGNOSIS — I10 Essential (primary) hypertension: Secondary | ICD-10-CM

## 2019-07-12 NOTE — Telephone Encounter (Signed)
  Patient Consent for Virtual Visit         Kristin Hood has provided verbal consent on 07/12/2019 for a virtual visit (video or telephone).   CONSENT FOR VIRTUAL VISIT FOR:  Kristin Hood  By participating in this virtual visit I agree to the following:  I hereby voluntarily request, consent and authorize [facility] and its employed or contracted physicians, physician assistants, nurse practitioners or other licensed health care professionals (the Practitioner), to provide me with telemedicine health care services (the "Services") as deemed necessary by the treating Practitioner. I acknowledge and consent to receive the Services by the Practitioner via telemedicine. I understand that the telemedicine visit will involve communicating with the Practitioner through live audiovisual communication technology and the disclosure of certain medical information by electronic transmission. I acknowledge that I have been given the opportunity to request an in-person assessment or other available alternative prior to the telemedicine visit and am voluntarily participating in the telemedicine visit.  I understand that I have the right to withhold or withdraw my consent to the use of telemedicine in the course of my care at any time, without affecting my right to future care or treatment, and that the Practitioner or I may terminate the telemedicine visit at any time. I understand that I have the right to inspect all information obtained and/or recorded in the course of the telemedicine visit and may receive copies of available information for a reasonable fee.  I understand that some of the potential risks of receiving the Services via telemedicine include:  Marland Kitchen Delay or interruption in medical evaluation due to technological equipment failure or disruption; . Information transmitted may not be sufficient (e.g. poor resolution of images) to allow for appropriate medical decision making by the  Practitioner; and/or  . In rare instances, security protocols could fail, causing a breach of personal health information.  Furthermore, I acknowledge that it is my responsibility to provide information about my medical history, conditions and care that is complete and accurate to the best of my ability. I acknowledge that Practitioner's advice, recommendations, and/or decision may be based on factors not within their control, such as incomplete or inaccurate data provided by me or distortions of diagnostic images or specimens that may result from electronic transmissions. I understand that the practice of medicine is not an exact science and that Practitioner makes no warranties or guarantees regarding treatment outcomes. I acknowledge that a copy of this consent can be made available to me via my patient portal ([place]), or I can request a printed copy by calling the office of [place].    I understand that my insurance will be billed for this visit.   I have read or had this consent read to me. . I understand the contents of this consent, which adequately explains the benefits and risks of the Services being provided via telemedicine.  . I have been provided ample opportunity to ask questions regarding this consent and the Services and have had my questions answered to my satisfaction. . I give my informed consent for the services to be provided through the use of telemedicine in my medical care

## 2019-07-12 NOTE — Patient Instructions (Addendum)
After Visit Summary:  We will be checking the following labs today - NONE   Medication Instructions:    Continue with your current medicines.    If you need a refill on your cardiac medications before your next appointment, please call your pharmacy.     Testing/Procedures To Be Arranged:  N/A  Follow-Up:   See [name] in 3 months - ok for virtual visit    At [facility], you and your health needs are our priority.  As part of our continuing mission to provide you with exceptional heart care, we have created designated Provider Care Teams.  These Care Teams include your primary Cardiologist (physician) and Advanced Practice Providers (APPs -  Physician Assistants and Nurse Practitioners) who all work together to provide you with the care you need, when you need it.  Special Instructions:  . Stay safe, stay home, wash your hands for at least 20 seconds and wear a mask when out in public.  . It was good to talk with you today.    Call the [place] office at [phone] if you have any questions, problems or concerns.

## 2019-07-25 DIAGNOSIS — I5032 Chronic diastolic (congestive) heart failure: Secondary | ICD-10-CM | POA: Diagnosis not present

## 2019-07-25 DIAGNOSIS — I272 Pulmonary hypertension, unspecified: Secondary | ICD-10-CM | POA: Diagnosis not present

## 2019-07-25 DIAGNOSIS — I10 Essential (primary) hypertension: Secondary | ICD-10-CM | POA: Diagnosis not present

## 2019-07-25 DIAGNOSIS — E782 Mixed hyperlipidemia: Secondary | ICD-10-CM | POA: Diagnosis not present

## 2019-07-25 DIAGNOSIS — E890 Postprocedural hypothyroidism: Secondary | ICD-10-CM | POA: Diagnosis not present

## 2019-07-25 DIAGNOSIS — J441 Chronic obstructive pulmonary disease with (acute) exacerbation: Secondary | ICD-10-CM | POA: Diagnosis not present

## 2019-07-25 DIAGNOSIS — I509 Heart failure, unspecified: Secondary | ICD-10-CM | POA: Diagnosis not present

## 2019-07-25 DIAGNOSIS — N183 Chronic kidney disease, stage 3 unspecified: Secondary | ICD-10-CM | POA: Diagnosis not present

## 2019-07-25 DIAGNOSIS — M19041 Primary osteoarthritis, right hand: Secondary | ICD-10-CM | POA: Diagnosis not present

## 2019-07-25 DIAGNOSIS — I4891 Unspecified atrial fibrillation: Secondary | ICD-10-CM | POA: Diagnosis not present

## 2019-07-25 DIAGNOSIS — Z853 Personal history of malignant neoplasm of breast: Secondary | ICD-10-CM | POA: Diagnosis not present

## 2019-07-25 DIAGNOSIS — J449 Chronic obstructive pulmonary disease, unspecified: Secondary | ICD-10-CM | POA: Diagnosis not present

## 2019-07-28 ENCOUNTER — Encounter: Payer: Self-pay | Admitting: Internal Medicine

## 2019-08-11 DIAGNOSIS — J441 Chronic obstructive pulmonary disease with (acute) exacerbation: Secondary | ICD-10-CM | POA: Diagnosis not present

## 2019-08-11 DIAGNOSIS — J449 Chronic obstructive pulmonary disease, unspecified: Secondary | ICD-10-CM | POA: Diagnosis not present

## 2019-08-18 DIAGNOSIS — M2042 Other hammer toe(s) (acquired), left foot: Secondary | ICD-10-CM | POA: Diagnosis not present

## 2019-08-18 DIAGNOSIS — M79672 Pain in left foot: Secondary | ICD-10-CM | POA: Diagnosis not present

## 2019-08-18 DIAGNOSIS — M7742 Metatarsalgia, left foot: Secondary | ICD-10-CM | POA: Diagnosis not present

## 2019-08-19 ENCOUNTER — Inpatient Hospital Stay: Payer: PPO | Attending: Internal Medicine

## 2019-08-19 ENCOUNTER — Ambulatory Visit (HOSPITAL_COMMUNITY)
Admission: RE | Admit: 2019-08-19 | Discharge: 2019-08-19 | Disposition: A | Discharge status: Home / Self Care | Payer: PPO | Source: Ambulatory Visit | Attending: Internal Medicine | Admitting: Internal Medicine

## 2019-08-19 ENCOUNTER — Other Ambulatory Visit: Payer: Self-pay

## 2019-08-19 DIAGNOSIS — D649 Anemia, unspecified: Secondary | ICD-10-CM | POA: Insufficient documentation

## 2019-08-19 DIAGNOSIS — C8590 Non-Hodgkin lymphoma, unspecified, unspecified site: Secondary | ICD-10-CM | POA: Diagnosis not present

## 2019-08-19 DIAGNOSIS — Z8673 Personal history of transient ischemic attack (TIA), and cerebral infarction without residual deficits: Secondary | ICD-10-CM | POA: Insufficient documentation

## 2019-08-19 DIAGNOSIS — Z79899 Other long term (current) drug therapy: Secondary | ICD-10-CM | POA: Diagnosis not present

## 2019-08-19 DIAGNOSIS — D72819 Decreased white blood cell count, unspecified: Secondary | ICD-10-CM | POA: Insufficient documentation

## 2019-08-19 DIAGNOSIS — C8580 Other specified types of non-Hodgkin lymphoma, unspecified site: Secondary | ICD-10-CM | POA: Diagnosis not present

## 2019-08-19 DIAGNOSIS — Z7901 Long term (current) use of anticoagulants: Secondary | ICD-10-CM | POA: Insufficient documentation

## 2019-08-19 DIAGNOSIS — I482 Chronic atrial fibrillation, unspecified: Secondary | ICD-10-CM | POA: Diagnosis not present

## 2019-08-19 DIAGNOSIS — Z9012 Acquired absence of left breast and nipple: Secondary | ICD-10-CM | POA: Diagnosis not present

## 2019-08-19 DIAGNOSIS — Z853 Personal history of malignant neoplasm of breast: Secondary | ICD-10-CM | POA: Insufficient documentation

## 2019-08-19 LAB — CMP (CANCER CENTER ONLY)
ALT: 12 U/L (ref 0–44)
AST: 25 U/L (ref 15–41)
Albumin: 3.2 g/dL — ABNORMAL LOW (ref 3.5–5.0)
Alkaline Phosphatase: 37 U/L — ABNORMAL LOW (ref 38–126)
Anion gap: 9 (ref 5–15)
BUN: 34 mg/dL — ABNORMAL HIGH (ref 8–23)
CO2: 31 mmol/L (ref 22–32)
Calcium: 9 mg/dL (ref 8.9–10.3)
Chloride: 100 mmol/L (ref 98–111)
Creatinine: 1.04 mg/dL — ABNORMAL HIGH (ref 0.44–1.00)
GFR, Est AFR Am: 59 mL/min — ABNORMAL LOW (ref >60)
GFR, Estimated: 51 mL/min — ABNORMAL LOW (ref >60)
Glucose, Bld: 87 mg/dL (ref 70–99)
Potassium: 3.7 mmol/L (ref 3.5–5.1)
Sodium: 140 mmol/L (ref 135–145)
Total Bilirubin: 1.2 mg/dL (ref 0.3–1.2)
Total Protein: 8.2 g/dL — ABNORMAL HIGH (ref 6.5–8.1)

## 2019-08-19 LAB — CBC WITH DIFFERENTIAL (CANCER CENTER ONLY)
Abs Immature Granulocytes: 0.01 10*3/uL (ref 0.00–0.07)
Basophils Absolute: 0 10*3/uL (ref 0.0–0.1)
Basophils Relative: 1 %
Eosinophils Absolute: 0 10*3/uL (ref 0.0–0.5)
Eosinophils Relative: 1 %
HCT: 35.4 % — ABNORMAL LOW (ref 36.0–46.0)
Hemoglobin: 10.8 g/dL — ABNORMAL LOW (ref 12.0–15.0)
Immature Granulocytes: 0 %
Lymphocytes Relative: 12 %
Lymphs Abs: 0.5 10*3/uL — ABNORMAL LOW (ref 0.7–4.0)
MCH: 28.5 pg (ref 26.0–34.0)
MCHC: 30.5 g/dL (ref 30.0–36.0)
MCV: 93.4 fL (ref 80.0–100.0)
Monocytes Absolute: 0.6 10*3/uL (ref 0.1–1.0)
Monocytes Relative: 15 %
Neutro Abs: 2.7 10*3/uL (ref 1.7–7.7)
Neutrophils Relative %: 71 %
Platelet Count: 255 10*3/uL (ref 150–400)
RBC: 3.79 MIL/uL — ABNORMAL LOW (ref 3.87–5.11)
RDW: 15.2 % (ref 11.5–15.5)
WBC Count: 3.8 10*3/uL — ABNORMAL LOW (ref 4.0–10.5)
nRBC: 0 % (ref 0.0–0.2)

## 2019-08-19 MED ORDER — IOHEXOL 300 MG/ML  SOLN
100.0000 mL | Freq: Once | INTRAMUSCULAR | Status: AC | PRN
  Administered 2019-08-19: 80 mL via INTRAVENOUS

## 2019-08-19 MED ORDER — SODIUM CHLORIDE (PF) 0.9 % IJ SOLN
INTRAMUSCULAR | Status: AC
  Filled 2019-08-19: qty 50

## 2019-08-22 ENCOUNTER — Inpatient Hospital Stay (HOSPITAL_BASED_OUTPATIENT_CLINIC_OR_DEPARTMENT_OTHER): Payer: PPO | Admitting: Internal Medicine

## 2019-08-22 ENCOUNTER — Encounter: Payer: Self-pay | Admitting: Internal Medicine

## 2019-08-22 ENCOUNTER — Other Ambulatory Visit: Payer: Self-pay

## 2019-08-22 VITALS — BP 128/82 | HR 77 | Temp 99.5°F | Resp 18 | Ht 65.0 in | Wt 190.6 lb

## 2019-08-22 DIAGNOSIS — I5032 Chronic diastolic (congestive) heart failure: Secondary | ICD-10-CM | POA: Diagnosis not present

## 2019-08-22 DIAGNOSIS — C8580 Other specified types of non-Hodgkin lymphoma, unspecified site: Secondary | ICD-10-CM | POA: Diagnosis not present

## 2019-08-22 DIAGNOSIS — I10 Essential (primary) hypertension: Secondary | ICD-10-CM

## 2019-08-22 DIAGNOSIS — J441 Chronic obstructive pulmonary disease with (acute) exacerbation: Secondary | ICD-10-CM | POA: Diagnosis not present

## 2019-08-22 DIAGNOSIS — I4891 Unspecified atrial fibrillation: Secondary | ICD-10-CM | POA: Diagnosis not present

## 2019-08-22 DIAGNOSIS — E890 Postprocedural hypothyroidism: Secondary | ICD-10-CM | POA: Diagnosis not present

## 2019-08-22 DIAGNOSIS — J449 Chronic obstructive pulmonary disease, unspecified: Secondary | ICD-10-CM | POA: Diagnosis not present

## 2019-08-22 DIAGNOSIS — N183 Chronic kidney disease, stage 3 unspecified: Secondary | ICD-10-CM | POA: Diagnosis not present

## 2019-08-22 DIAGNOSIS — M19041 Primary osteoarthritis, right hand: Secondary | ICD-10-CM | POA: Diagnosis not present

## 2019-08-22 DIAGNOSIS — E782 Mixed hyperlipidemia: Secondary | ICD-10-CM | POA: Diagnosis not present

## 2019-08-22 DIAGNOSIS — I509 Heart failure, unspecified: Secondary | ICD-10-CM | POA: Diagnosis not present

## 2019-08-22 DIAGNOSIS — I272 Pulmonary hypertension, unspecified: Secondary | ICD-10-CM | POA: Diagnosis not present

## 2019-08-22 NOTE — Progress Notes (Signed)
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  PRIOR THERAPY: None.  CURRENT THERAPY: Observation.  INTERVAL HISTORY: Kristin Hood 79 y.o. female returns to the clinic today for follow-up visit accompanied by her daughter.  The patient is feeling fine today with no concerning complaints except for shortness of breath with exertion as well as right-sided chest pain and lower extremity swelling.  She is currently on Lasix for the swelling.  She has a history of congestive heart failure.  She denied having any current cough or hemoptysis.  She denied having any recent weight loss or night sweats.  She has no nausea, vomiting, diarrhea or constipation.  She is currently on home oxygen at nighttime by her pulmonologist Dr. [name].  The patient denied having any fever or chills.  She had repeat CT scan of the chest, abdomen pelvis performed recently and she is here for evaluation and discussion of her risk her results.  MEDICAL HISTORY: Past Medical History:  Diagnosis Date  . Acute congestive heart failure ([place])   . Acute diastolic CHF (congestive heart failure) ([place]) 12/30/2016  . Acute respiratory failure with hypoxemia ([place]) 06/09/2016  . Acute respiratory failure with hypoxia ([place]) 12/30/2016  . Anginal pain ([place])   . ARF (acute renal failure) ([place]) 06/11/2016  . Arthritis   . Atrial fibrillation ([place]) 07/2012   Eliquis, rate controlled  . Benign hypertensive heart disease without heart failure 12/25/2012  . Breast cancer  ([place]) 2001   left mastectomy  . Chest pain 12/11/2012  . CHF (congestive heart failure) ([place]) 06/10/2016  . Chronic atrial fibrillation ([place]) 11/16/2014  . Chronic diastolic heart failure ([place]) 02/13/2014  . Coarse tremors    , Essential  . Complication of anesthesia    SMALLER TUBE FOR INTUBATION AS GAGS ON TUBE  . COPD (chronic obstructive pulmonary disease) ([place])   . COPD exacerbation ([place]) 12/30/2016  . CVA (cerebral vascular accident) ([place]) 2013  . Dysrhythmia 08/20/2012   Atrial Fibrillation  . Essential and other specified forms of tremor 06/18/2012  . Essential hypertension 06/18/2012  . Family history of adverse reaction to anesthesia    mother experienced pain with intubation   . GERD (gastroesophageal reflux disease)    history of   . Hard of hearing   . Heart murmur   . History of chemotherapy 11/13/1998 to 2010   Adriamycin/Docotaxol, Dexorubicin/Taxotere, Femara, Tamoxifen  . History of CVA (cerebrovascular accident) 12/11/2012  . History of hiatal hernia    history of   . Hx of adenomatous colonic polyps 2006   , Benign  . Hypercholesteremia   . Hyperlipidemia 02/13/2014  . Hypertension    , With mild concentric left ventricular hypertrophy  . Long term current use of anticoagulant therapy 12/25/2012  . Mixed dyslipidemia   . Mixed hyperlipidemia 12/25/2012  . Morbid obesity ([place]) 06/18/2012  . Phlebitis and thrombophlebitis of superficial vessels of lower extremities 12/25/2012  . 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 Pure hypercholesterolemia 12/25/2012  . Shingles   . Shortness of breath dyspnea    on exertion   . SOB (  shortness of breath) 11/16/2014  . Spider veins   . Stroke ([place]) 11/17/2011   left side brain-speech-TPA  . Thyroid nodule   . Tremor, essential   . Unspecified cerebral artery occlusion with cerebral infarction 11/18/2011  . Valvular sclerosis 09/23/2003   without stenosis  . Varicose veins     ALLERGIES:  is allergic to floxin  [ofloxacin].  MEDICATIONS:  Current Outpatient Medications  Medication Sig Dispense Refill  . albuterol (VENTOLIN HFA) 108 (90 Base) MCG/ACT inhaler Inhale 2 puffs into the lungs every 4 (four) hours as needed for wheezing or shortness of breath. 8 g 11  . atorvastatin (LIPITOR) 20 MG tablet Take 20 mg by mouth at bedtime.     . budesonide (PULMICORT) 0.5 MG/2ML nebulizer solution Take 0.5 mg by nebulization 2 (two) times daily.    Marland Kitchen ELIQUIS 5 MG TABS tablet TAKE 1 TABLET BY MOUTH TWICE A DAY 180 tablet 2  . fenofibrate 160 MG tablet Take 160 mg by mouth at bedtime.     . fluticasone (FLONASE) 50 MCG/ACT nasal spray Place 2 sprays into both nostrils daily as needed for allergies or rhinitis.     . furosemide (LASIX) 40 MG tablet Take 1 tablet (40 mg total) by mouth daily. 90 tablet 3  . guaiFENesin (MUCINEX) 600 MG 12 hr tablet Take 600 mg by mouth daily.    Marland Kitchen levothyroxine (SYNTHROID) 175 MCG tablet TAKE 1 TABLET BY MOUTH EVERY DAY BEFORE BREAKFAST 90 tablet 3  . loratadine (CLARITIN) 10 MG tablet Take 10 mg by mouth daily as needed for allergies (rash).     . Multiple Vitamin (MULTIVITAMIN WITH MINERALS) TABS tablet Take 1 tablet by mouth daily.     . propranolol ER (INDERAL LA) 60 MG 24 hr capsule TAKE 2 CAPSULES IN THE MORNING & TAKE 1 CAPSULE IN THE AFTERNOON 270 capsule 2  . Tiotropium Bromide-Olodaterol (STIOLTO RESPIMAT) 2.5-2.5 MCG/ACT AERS Inhale 2 puffs into the lungs daily. 8 g 0   No current facility-administered medications for this visit.    SURGICAL HISTORY:  Past Surgical History:  Procedure Laterality Date  . APPENDECTOMY  07/10/1957  . BIOPSY THYROID  12/01/2013   ultrasound  . BREAST SURGERY    . BUNIONECTOMY  11/29/1988   bilateral with hammer toes  . CARDIAC CATHETERIZATION  07/2010   , Without significant CAD  . CARDIAC CATHETERIZATION  07/2011   [name]  . CESAREAN SECTION    . CESAREAN SECTION  05/1968, 07/1965  . EYE SURGERY  01/31/2013   eye lid;  cataract surgery bilat   . JOINT REPLACEMENT  05/2001   left knee  . JOINT REPLACEMENT  11/2001   right knee  . JOINT REPLACEMENT  01/2008   redo right knee  . KNEE SURGERY  2009   ,Total knee replacement  . LEFT HEART CATHETERIZATION WITH CORONARY ANGIOGRAM N/A 08/14/2011   Procedure: LEFT HEART CATHETERIZATION WITH CORONARY ANGIOGRAM;  Surgeon: [name], MD;  Location: [facility] CATH LAB;  Service: Cardiovascular;  Laterality: N/A;  . Left Rotator Cuff    . MASTECTOMY Left 10/19/1998   Radical, left -with lymph nodes (8 total)  . Reverse bunionectomy  08/29/1993   removal of Tibial Sesamoid-left  . TEE WITHOUT CARDIOVERSION  11/19/2011   Procedure: TRANSESOPHAGEAL ECHOCARDIOGRAM (TEE);  Surgeon: [name], MD;  Location: [facility] ENDOSCOPY;  Service: Cardiovascular;  Laterality: N/A;  . THYROIDECTOMY N/A 03/02/2015   Procedure: TOTAL THYROIDECTOMY;  Surgeon: Armandina Gemma, MD;  Location: [facility] ORS;  Service: General;  Laterality: N/A;  . TONSILLECTOMY    . TOTAL HIP ARTHROPLASTY Left 10/11/2014   Procedure: LEFT TOTAL HIP ARTHROPLASTY ANTERIOR APPROACH;  Surgeon: [name], MD;  Location: [facility] ORS;  Service: Orthopedics;  Laterality: Left;    REVIEW OF SYSTEMS:  A comprehensive review of systems was negative except for: Constitutional: positive for fatigue Respiratory: positive for dyspnea on exertion and pleurisy/chest pain   PHYSICAL EXAMINATION: General appearance: alert, cooperative, fatigued and no distress Head: Normocephalic, without obvious abnormality, atraumatic Neck: no adenopathy, no JVD, supple, symmetrical, trachea midline and thyroid not enlarged, symmetric, no tenderness/mass/nodules Lymph nodes: Cervical, supraclavicular, and axillary nodes normal. Resp: diminished breath sounds RLL and dullness to percussion RLL Back: symmetric, no curvature. ROM normal. No CVA tenderness. Cardio: regular rate and rhythm, S1, S2 normal, no murmur, click, rub or gallop GI: soft, non-tender;  bowel sounds normal; no masses,  no organomegaly Extremities: edema 1+ edema bilaterally  ECOG PERFORMANCE STATUS: 1 - Symptomatic but completely ambulatory  Blood pressure 128/82, pulse 77, temperature 99.5 F (37.5 C), temperature source Temporal, resp. rate 18, height _0  (1.651 m), weight 190 lb 9.6 oz (86.5 kg), SpO2 93 %.  LABORATORY DATA: Lab Results  Component Value Date   WBC 3.8 (L) 08/19/2019   HGB 10.8 (L) 08/19/2019   HCT 35.4 (L) 08/19/2019   MCV 93.4 08/19/2019   PLT 255 08/19/2019      Chemistry      Component Value Date/Time   NA 140 08/19/2019 0853   NA 140 04/15/2019 0907   NA 141 07/15/2016 0906   K 3.7 08/19/2019 0853   K 4.0 07/15/2016 0906   CL 100 08/19/2019 0853   CO2 31 08/19/2019 0853   CO2 25 07/15/2016 0906   BUN 34 (H) 08/19/2019 0853   BUN 35 (H) 04/15/2019 0907   BUN 20.7 07/15/2016 0906   CREATININE 1.04 (H) 08/19/2019 0853   CREATININE 1.1 07/15/2016 0906      Component Value Date/Time   CALCIUM 9.0 08/19/2019 0853   CALCIUM 9.6 07/15/2016 0906   ALKPHOS 37 (L) 08/19/2019 0853   ALKPHOS 29 (L) 07/15/2016 0906   AST 25 08/19/2019 0853   AST 19 07/15/2016 0906   ALT 12 08/19/2019 0853   ALT 13 07/15/2016 0906   BILITOT 1.2 08/19/2019 0853   BILITOT 1.13 07/15/2016 0906       RADIOGRAPHIC STUDIES: CT Chest W Contrast  Result Date: 08/19/2019 CLINICAL DATA:  Primary Cancer Type: Lymphoma Imaging Indication: Active Surveillance Interval therapy since last imaging? No Initial Cancer Diagnosis Date: 02/05/2018;  established by: Biopsy-proven Detailed Pathology: Low grade non-Hodgkin B-cell lymphoma. Chemotherapy: No Immunotherapy? No Radiation therapy? No Other Cancers: Breast cancer; left mastectomy 2001. EXAM: CT CHEST, ABDOMEN, AND PELVIS WITH CONTRAST TECHNIQUE: Multidetector CT imaging of the chest, abdomen and pelvis was performed following the standard protocol during bolus administration of intravenous contrast. CONTRAST:  100  mL OMNIPAQUE IOHEXOL 300 MG/ML  SOLN COMPARISON:  Most recent CT abdomen and pelvis 10/24/2018. CT chest, abdomen and pelvis 08/19/2018. 11/17/2013 PET-CT. FINDINGS: CT CHEST FINDINGS Cardiovascular: Stable mild cardiomegaly. No significant pericardial effusion/thickening. Three-vessel coronary atherosclerosis. Atherosclerotic thoracic aorta with stable ectatic 4.0 cm ascending thoracic aorta. Top-normal caliber main pulmonary artery (3.1 cm diameter). No central pulmonary emboli. Mediastinum/Nodes: Total thyroidectomy. Unremarkable esophagus. No pathologically enlarged axillary nodes. Asymmetrically mildly enlarged 0.9 cm short axis diameter right internal mammary node (series 2/image 21), slightly increased from 0.8 cm. Newly mildly enlarged 1.0 cm  right pericardiophrenic node (series 2/image 40), previously 0.8 cm. Right paratracheal adenopathy up to 2.3 cm (series 2/image 27), previously 1.9 cm, mildly increased. Enlarged 2.7 cm subcarinal node (series 2/image 34), previously 2.2 cm, increased. Stable coarsely calcified right hilar nodes. Mildly enlarged 1.1 cm left infrahilar node (series 2/image 33), previously 0.9 cm, mildly increased. Enlarged 1.3 cm right retrocrural node (series 2/image 56), previously 1.3 cm, stable. Lungs/Pleura: No pneumothorax. Moderate dependent right pleural effusion, increased. No left pleural effusion. Mosaic attenuation throughout both lungs, unchanged. Bandlike consolidation in the medial lower lungs bilaterally, compatible with atelectasis or postinfectious/postinflammatory scarring, mildly worsened from prior. No lung masses or significant pulmonary nodules. Musculoskeletal: No aggressive appearing focal osseous lesions. Mild thoracic spondylosis. Status post left mastectomy. CT ABDOMEN PELVIS FINDINGS Hepatobiliary: Normal liver with no liver mass. Cholelithiasis. No biliary ductal dilatation. Pancreas: Normal, with no mass or duct dilation. Spleen: Moderate splenomegaly.  Craniocaudal splenic length 17.2 cm, previously 16.4 cm, mildly increased. No discrete splenic mass. Granulomatous splenic calcifications are unchanged. Adrenals/Urinary Tract: Normal adrenals. No hydronephrosis. Focus of perinephric soft tissue in the anterior upper right kidney measures 2.0 x 1.8 cm (series 7/image 13), previously poorly delineated on the noncontrast CT images, not appreciably changed. Stable scattered bilateral subcentimeter low-attenuation renal cortical lesions, too small to characterize. No new renal masses. Bladder obscured by streak artifact from left hip hardware. Bladder is nondistended and grossly normal. Stomach/Bowel: Normal non-distended stomach. Normal caliber small bowel with no small bowel wall thickening. Oral contrast transits to the colon. Marked left colonic diverticulosis, with no large bowel wall thickening or acute pericolonic fat stranding. Vascular/Lymphatic: Atherosclerotic nonaneurysmal abdominal aorta. Patent hepatic, portal, splenic and renal veins. Widespread retroperitoneal adenopathy in the gastrohepatic ligament, perisplenic region, aortocaval and left para-aortic chains, stable to minimally increased. Representative 3.0 cm short axis diameter left para-aortic node (series 2/image 70), previously 2.9 cm, slightly increased. Left retroperitoneal adenopathy along the splenic vein measures up to 2.2 cm short axis diameter (series 2/image 72), previously 2.2 cm, stable. Representative 2.5 cm aortocaval node (series 2/image 83), previously 2.4 cm, minimally increased. Gastrohepatic ligament 1.0 cm node (series 2/image 59), previously 1.0 cm, stable. No pathologically enlarged pelvic nodes. Reproductive: Stable tiny calcified uterine fibroids. No adnexal masses. Trace pelvic cul de sac free fluid, new. Other: No pneumoperitoneum or focal fluid collection. Musculoskeletal: No aggressive appearing focal osseous lesions. Left total hip arthroplasty. Moderate thoracic  spondylosis. IMPRESSION: 1. Mild progression of lymphoma. Widespread adenopathy in the chest and retroperitoneum, stable to mildly increased as detailed. Moderate splenomegaly, mildly increased. 2. Moderate dependent right pleural effusion, increased. 3. Cardiomegaly. Mosaic attenuation throughout both lungs, unchanged, differential includes mosaic attenuation from pulmonary vascular disease and/or air trapping. 4. Cholelithiasis. 5. Marked left colonic diverticulosis. 6. Aortic Atherosclerosis (ICD10-I70.0). Electronically Signed   By: Ilona Sorrel M.D.   On: 08/19/2019 11:17   CT Abdomen Pelvis W Contrast  Result Date: 08/19/2019 CLINICAL DATA:  Primary Cancer Type: Lymphoma Imaging Indication: Active Surveillance Interval therapy since last imaging? No Initial Cancer Diagnosis Date: 02/05/2018;  established by: Biopsy-proven Detailed Pathology: Low grade non-Hodgkin B-cell lymphoma. Chemotherapy: No Immunotherapy? No Radiation therapy? No Other Cancers: Breast cancer; left mastectomy 2001. EXAM: CT CHEST, ABDOMEN, AND PELVIS WITH CONTRAST TECHNIQUE: Multidetector CT imaging of the chest, abdomen and pelvis was performed following the standard protocol during bolus administration of intravenous contrast. CONTRAST:  100 mL OMNIPAQUE IOHEXOL 300 MG/ML  SOLN COMPARISON:  Most recent CT abdomen and pelvis 10/24/2018. CT chest, abdomen and  pelvis 08/19/2018. 11/17/2013 PET-CT. FINDINGS: CT CHEST FINDINGS Cardiovascular: Stable mild cardiomegaly. No significant pericardial effusion/thickening. Three-vessel coronary atherosclerosis. Atherosclerotic thoracic aorta with stable ectatic 4.0 cm ascending thoracic aorta. Top-normal caliber main pulmonary artery (3.1 cm diameter). No central pulmonary emboli. Mediastinum/Nodes: Total thyroidectomy. Unremarkable esophagus. No pathologically enlarged axillary nodes. Asymmetrically mildly enlarged 0.9 cm short axis diameter right internal mammary node (series 2/image 21),  slightly increased from 0.8 cm. Newly mildly enlarged 1.0 cm right pericardiophrenic node (series 2/image 40), previously 0.8 cm. Right paratracheal adenopathy up to 2.3 cm (series 2/image 27), previously 1.9 cm, mildly increased. Enlarged 2.7 cm subcarinal node (series 2/image 34), previously 2.2 cm, increased. Stable coarsely calcified right hilar nodes. Mildly enlarged 1.1 cm left infrahilar node (series 2/image 33), previously 0.9 cm, mildly increased. Enlarged 1.3 cm right retrocrural node (series 2/image 56), previously 1.3 cm, stable. Lungs/Pleura: No pneumothorax. Moderate dependent right pleural effusion, increased. No left pleural effusion. Mosaic attenuation throughout both lungs, unchanged. Bandlike consolidation in the medial lower lungs bilaterally, compatible with atelectasis or postinfectious/postinflammatory scarring, mildly worsened from prior. No lung masses or significant pulmonary nodules. Musculoskeletal: No aggressive appearing focal osseous lesions. Mild thoracic spondylosis. Status post left mastectomy. CT ABDOMEN PELVIS FINDINGS Hepatobiliary: Normal liver with no liver mass. Cholelithiasis. No biliary ductal dilatation. Pancreas: Normal, with no mass or duct dilation. Spleen: Moderate splenomegaly. Craniocaudal splenic length 17.2 cm, previously 16.4 cm, mildly increased. No discrete splenic mass. Granulomatous splenic calcifications are unchanged. Adrenals/Urinary Tract: Normal adrenals. No hydronephrosis. Focus of perinephric soft tissue in the anterior upper right kidney measures 2.0 x 1.8 cm (series 7/image 13), previously poorly delineated on the noncontrast CT images, not appreciably changed. Stable scattered bilateral subcentimeter low-attenuation renal cortical lesions, too small to characterize. No new renal masses. Bladder obscured by streak artifact from left hip hardware. Bladder is nondistended and grossly normal. Stomach/Bowel: Normal non-distended stomach. Normal caliber  small bowel with no small bowel wall thickening. Oral contrast transits to the colon. Marked left colonic diverticulosis, with no large bowel wall thickening or acute pericolonic fat stranding. Vascular/Lymphatic: Atherosclerotic nonaneurysmal abdominal aorta. Patent hepatic, portal, splenic and renal veins. Widespread retroperitoneal adenopathy in the gastrohepatic ligament, perisplenic region, aortocaval and left para-aortic chains, stable to minimally increased. Representative 3.0 cm short axis diameter left para-aortic node (series 2/image 70), previously 2.9 cm, slightly increased. Left retroperitoneal adenopathy along the splenic vein measures up to 2.2 cm short axis diameter (series 2/image 72), previously 2.2 cm, stable. Representative 2.5 cm aortocaval node (series 2/image 83), previously 2.4 cm, minimally increased. Gastrohepatic ligament 1.0 cm node (series 2/image 59), previously 1.0 cm, stable. No pathologically enlarged pelvic nodes. Reproductive: Stable tiny calcified uterine fibroids. No adnexal masses. Trace pelvic cul de sac free fluid, new. Other: No pneumoperitoneum or focal fluid collection. Musculoskeletal: No aggressive appearing focal osseous lesions. Left total hip arthroplasty. Moderate thoracic spondylosis. IMPRESSION: 1. Mild progression of lymphoma. Widespread adenopathy in the chest and retroperitoneum, stable to mildly increased as detailed. Moderate splenomegaly, mildly increased. 2. Moderate dependent right pleural effusion, increased. 3. Cardiomegaly. Mosaic attenuation throughout both lungs, unchanged, differential includes mosaic attenuation from pulmonary vascular disease and/or air trapping. 4. Cholelithiasis. 5. Marked left colonic diverticulosis. 6. Aortic Atherosclerosis (ICD10-I70.0). Electronically Signed   By: Ilona Sorrel M.D.   On: 08/19/2019 11:17    ASSESSMENT AND PLAN:  This is a very pleasant 79 years old white female with persistent lymphadenopathy as well as  mild hepatosplenomegaly suspicious for low-grade lymphoma. The patient is currently  on observation. She had a repeat CT scan of the chest, abdomen and pelvis that showed further increase in the lymphadenopathy in the chest and retroperitoneum. The patient recently underwent CT-guided core biopsy of retroperitoneal lymph node in addition to bone marrow biopsy and aspirate.  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 The patient is currently on observation and she is feeling fine today with no concerning complaints except for the baseline shortness of breath secondary to congestive heart failure and COPD. She had repeat CT scan of the chest, abdomen pelvis performed recently.  I personally and independently reviewed the scans and discussed the results with the patient and her daughter. Her scan showed very mild progression of her lymphoma with increased and the adenopathy in the chest as well as retroperitoneum with mild increase in the splenomegaly and moderate right pleural effusion. I recommended for the patient to continue on observation with repeat CT scan of the chest, abdomen pelvis in 1 year.  The patient has no interest in systemic therapy with chemotherapy in the future. For the right pleural effusion, we will continue to monitor closely and consider the patient for right-sided ultrasound guided thoracentesis if she has further increase in the pleural effusion or significant shortness of breath. For the swelling of the lower extremities, she will continue her current treatment with Lasix for now and follow-up with her cardiologist. The patient was advised to call immediately if she has any concerning symptoms in the interval.  The total time spent in the appointment was 30 minutes.  The patient voices understanding of current disease status and treatment options and is in agreement with the current care plan. All questions were answered.  The patient knows to call the clinic with any problems, questions or concerns. We can certainly see the patient much sooner if necessary.  Disclaimer: This note was dictated with voice recognition software. Similar sounding words can inadvertently be transcribed and may not be corrected upon review.

## 2019-08-24 ENCOUNTER — Telehealth: Payer: Self-pay | Admitting: Internal Medicine

## 2019-08-24 NOTE — Telephone Encounter (Signed)
Scheduled appt per 5/24 los - mailed pt reminder letter with appt date and time

## 2019-08-25 ENCOUNTER — Ambulatory Visit: Payer: PPO | Admitting: Emergency Medicine

## 2019-08-25 ENCOUNTER — Encounter: Payer: Self-pay | Admitting: Emergency Medicine

## 2019-08-25 ENCOUNTER — Other Ambulatory Visit: Payer: Self-pay

## 2019-08-25 VITALS — BP 134/72 | HR 73 | Temp 98.2°F | Ht 65.0 in | Wt 188.4 lb

## 2019-08-25 DIAGNOSIS — J301 Allergic rhinitis due to pollen: Secondary | ICD-10-CM | POA: Diagnosis not present

## 2019-08-25 DIAGNOSIS — R05 Cough: Secondary | ICD-10-CM

## 2019-08-25 DIAGNOSIS — J90 Pleural effusion, not elsewhere classified: Secondary | ICD-10-CM | POA: Insufficient documentation

## 2019-08-25 DIAGNOSIS — C8580 Other specified types of non-Hodgkin lymphoma, unspecified site: Secondary | ICD-10-CM

## 2019-08-25 DIAGNOSIS — R059 Cough, unspecified: Secondary | ICD-10-CM

## 2019-08-25 DIAGNOSIS — J449 Chronic obstructive pulmonary disease, unspecified: Secondary | ICD-10-CM | POA: Diagnosis not present

## 2019-08-25 NOTE — Progress Notes (Signed)
Subjective:    Patient ID: IRAH ORLOSKY, female    DOB: 07/03/1940, 79 y.o.   MRN: [id number]  Shortness of Breath Pertinent negatives include no ear pain, fever, headaches, leg swelling, rash, rhinorrhea, sore throat, vomiting or wheezing.   ROV 05/18/2018 --79 year old woman with history of breast cancer, lymphadenopathy that is been felt to likely be a low-grade lymphoma.  She is been followed by Dr. [name] and has been on observation.  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 They are planning to follow off therapy for now.  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 She also has A fib, dCHF. She was just admitted for acute dyspnea, diastolic CHF exacerbation. She is much better with diuresis. She remains on Spiriva - sometimes forgets it. She is unsure whether she benefits from it. She uses albuterol about once a week - usually for dyspnea. It does seem to help her.   ROV 05/10/2019 -- 79 year old woman with COPD, bronchomalacia with associated cough.  She also has a history of atrial fibrillation, hypertension with diastolic CHF, is followed by Dr. 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   Since last time she was seen by me she has been seen in our office by Wyn Quaker, has had her Lasix adjusted.  She follows with [name] for her diastolic CHF and is now taking 40mg  daily, adding an additional dose later in the day depending on her wt > if she gains 1 lb and feels any dyspnea.  Also her bronchodilator regimen has been adjusted, now on Stiolto + pulmicort nebs bid. She had duoneb before she got the stiolto - now off. Has albuterol and uses rarely. She was also started on 2L/min nocturnal O2 based on ONO. Has not seen any exertional desats.  On flonase, loratadine prn, benadryl. mucinex bid  Continues to follow with Dr [name], repeat Ct  chest and abd due in May.  Flu shot up to date, PNA shot up to date. 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  She also has hypertension with diastolic CHF followed by [name]. Currently managed on Stiolto, Pulmicort nebs.  She is using albuterol She is experiencing more UA noise and needs to clear her throat, seems to be weather-dependent. She is using mucinex, has loratadine and flonase available prn. She occasionally has reflux when she clears her throat strongly.   Most recent CT scan of the chest was done 08/19/2019 and I have reviewed, shows possible mild progression of her lymphoma with widespread adenopathy in the chest and retroperitoneum, moderate splenomegaly, moderate dependent right pleural effusion and some mosaic attenuation in both lungs. She is holding off on any further chemo - doesn't want it, is discussing with Dr [name]  Reviewed CT chest, abdomen from 08/19/2019 Reviewed office notes from oncology 08/22/2019      Review of Systems  Constitutional: Negative for fever and unexpected weight change.  HENT: Positive for congestion and postnasal drip. Negative for dental problem, ear pain, nosebleeds, rhinorrhea, sinus pressure, sneezing, sore throat and trouble swallowing.   Eyes: Negative for redness and itching.  Respiratory: Positive for shortness of breath. Negative for cough, chest tightness and wheezing.   Cardiovascular: Negative for palpitations  and leg swelling.  Gastrointestinal: Negative for nausea and vomiting.  Genitourinary: Negative for dysuria.  Musculoskeletal: Negative for joint swelling.  Skin: Negative for rash.  Neurological: Negative for headaches.  Hematological: Does not bruise/bleed easily.  Psychiatric/Behavioral: Negative for dysphoric mood. The  patient is not nervous/anxious.        Objective:   Physical Exam Vitals:   08/25/19 1116  BP: 134/72  Pulse: 73  Temp: 98.2 F (36.8 C)  TempSrc: Oral  SpO2: 91%  Weight: 188 lb 6.4 oz (85.5 kg)  Height: 5\' 5"  (1.651 m)   Gen: Pleasant, overwt, in no distress,  normal affect  ENT: No lesions,  mouth clear,  oropharynx clear, no postnasal drip  Neck: No JVD, no stridor  Lungs: No use of accessory muscles, no crackles or wheeze, decreased at bases  Cardiovascular: RRR, early syst M 3/6, intact S2. No edema.   Musculoskeletal: No deformities, no cyanosis or clubbing  Neuro: alert, non focal, some tremor and tremulous speech.   Skin: Warm, no lesions or rash      Assessment & Plan:  COPD mixed type ([place]) Please continue Stiolto 2 puffs once daily as you have been taking it Please continue Pulmicort nebulizer treatments twice a day Keep albuterol available use 2 puffs if needed for shortness of breath, chest tightness, wheezing.. Follow with Dr [name] in 3 months or sooner if you have any problems.  Allergic rhinitis Currently off meds but with increased upper airway irritation.  No overt coughing but she does have to clear her throat frequently.  Trial restarting her maintenance meds to see if this helps.  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) Progression on most recent chest and abdominal imaging.  She does not want recurrent chemotherapy.  Following expectantly and conservatively with Dr. [name].  Pleural effusion on right Could be related to the lymphoma.  I will follow her with a chest x-ray in 3 months or sooner if she has dyspnea.  She may benefit from thoracentesis depending on the trend.  Baltazar Apo, MD, PhD 08/25/2019,  11:46 AM [facility] [phone] or if no answer [phone]

## 2019-08-25 NOTE — Assessment & Plan Note (Signed)
Could be related to the lymphoma.  I will follow her with a chest x-ray in 3 months or sooner if she has dyspnea.  She may benefit from thoracentesis depending on the trend.

## 2019-08-25 NOTE — Assessment & Plan Note (Signed)
Progression on most recent chest and abdominal imaging.  She does not want recurrent chemotherapy.  Following expectantly and conservatively with Dr. [name].

## 2019-08-25 NOTE — Patient Instructions (Addendum)
Please restart your fluticasone nasal spray, 2 sprays each nostril once daily to see if this helps with mucus and throat irritation Please restart your loratadine 10 mg once daily to see if this helps with your congestion and throat irritation Please continue Stiolto 2 puffs once daily as you have been taking it Please continue Pulmicort nebulizer treatments twice a day Keep albuterol available use 2 puffs if needed for shortness of breath, chest tightness, wheezing. We will repeat your chest x-ray in 3 months to follow your pleural effusion.  Please call us sooner if you develop any changes in your breathing.  We may decide to repeat your chest x-ray sooner if that occurs. Follow with Dr [name] in 3 months or sooner if you have any problems.

## 2019-08-25 NOTE — Assessment & Plan Note (Signed)
Please continue Stiolto 2 puffs once daily as you have been taking it Please continue Pulmicort nebulizer treatments twice a day Keep albuterol available use 2 puffs if needed for shortness of breath, chest tightness, wheezing.. Follow with Dr [name] in 3 months or sooner if you have any problems.

## 2019-08-25 NOTE — Addendum Note (Signed)
Addended by: Gavin Potters R on: 08/25/2019 02:24 PM   Modules accepted: Orders

## 2019-08-25 NOTE — Assessment & Plan Note (Signed)
Currently off meds but with increased upper airway irritation.  No overt coughing but she does have to clear her throat frequently.  Trial restarting her maintenance meds to see if this helps.  Please restart your fluticasone nasal spray, 2 sprays each nostril once daily to see if this helps with mucus and throat irritation Please restart your loratadine 10 mg once daily to see if this helps with your congestion and throat irritation

## 2019-08-26 ENCOUNTER — Telehealth: Payer: Self-pay

## 2019-08-26 NOTE — Telephone Encounter (Addendum)
The pt left her completed [employer] [facility] [employer] application at the office. I have completed the provider page of the application, scanned and emailed all to Howie Ill, RN who is working with Dr [name] today so she can obtain his signature , date it and fax all to fax number written on cover letter included.  Per Pam she has faxed the pts application to [employer].

## 2019-08-30 NOTE — Telephone Encounter (Signed)
Noted! Thank you

## 2019-09-07 NOTE — Telephone Encounter (Signed)
**Note De-Identified Mahad Newstrom Obfuscation** Letter received from [facility] stating that they have approved the pt for asst with her Eliquis. Approval good until 03/30/2020 Pt. Case#-[id number]  The letter states that they have also advised the pt of this approval.

## 2019-09-08 ENCOUNTER — Telehealth: Payer: Self-pay | Admitting: Emergency Medicine

## 2019-09-08 MED ORDER — DOXYCYCLINE HYCLATE 100 MG PO TABS
100.0000 mg | ORAL_TABLET | Freq: Two times a day (BID) | ORAL | 0 refills | Status: AC
Start: 2019-09-08 — End: ?

## 2019-09-08 MED ORDER — PREDNISONE 10 MG PO TABS
ORAL_TABLET | ORAL | 0 refills | Status: DC
Start: 2019-09-08 — End: 2019-10-04

## 2019-09-08 NOTE — Telephone Encounter (Signed)
OK to check CXR now Would treat for AE: Doxycycline 100mg  bid x 5 days Pred taper > Take 40mg  daily for 3 days, then 30mg  daily for 3 days, then 20mg  daily for 3 days, then 10mg  daily for 3 days, then stop

## 2019-09-08 NOTE — Telephone Encounter (Signed)
Spoke with patient. She verbalized understanding. She stated that she will try to come by tomorrow for the CXR. She verbalized understanding of medications. Will go ahead and call these into [facility].   Nothing further needed at time of call.

## 2019-09-08 NOTE — Telephone Encounter (Signed)
Spoke with patient. She stated that she has been struggling with her breathing for the past week due to the weather. She has had increased SOB. She normally uses 2.5L of O2 at night but has been using it during the day. She checked her O2 and it was hovering around 87%-88% on room air.   She has been using her rescue inhaler at least twice daily to help with the SOB. Also stated that it is uncomfortable for her to lay flat. She has to sleep in a reclined position.   She denied any coughing or chest pain.   She stated that she was doing well when she saw RB on 5/27 and a CXR was pushed off until 3 months from then. She wonders if she needs the CXR now as well as an antibiotic.   Pharm[facility]   RB, please advise. Thanks!

## 2019-09-09 ENCOUNTER — Ambulatory Visit (INDEPENDENT_AMBULATORY_CARE_PROVIDER_SITE_OTHER): Payer: PPO

## 2019-09-09 DIAGNOSIS — J939 Pneumothorax, unspecified: Secondary | ICD-10-CM | POA: Diagnosis not present

## 2019-09-09 DIAGNOSIS — J90 Pleural effusion, not elsewhere classified: Secondary | ICD-10-CM | POA: Diagnosis not present

## 2019-09-09 DIAGNOSIS — R05 Cough: Secondary | ICD-10-CM | POA: Diagnosis not present

## 2019-09-09 DIAGNOSIS — I517 Cardiomegaly: Secondary | ICD-10-CM | POA: Diagnosis not present

## 2019-09-09 DIAGNOSIS — R059 Cough, unspecified: Secondary | ICD-10-CM

## 2019-09-12 ENCOUNTER — Other Ambulatory Visit: Payer: Self-pay | Admitting: Family Medicine

## 2019-09-12 DIAGNOSIS — S0990XA Unspecified injury of head, initial encounter: Secondary | ICD-10-CM | POA: Diagnosis not present

## 2019-09-12 DIAGNOSIS — Z7901 Long term (current) use of anticoagulants: Secondary | ICD-10-CM | POA: Diagnosis not present

## 2019-09-12 DIAGNOSIS — W19XXXA Unspecified fall, initial encounter: Secondary | ICD-10-CM | POA: Diagnosis not present

## 2019-09-12 DIAGNOSIS — T148XXA Other injury of unspecified body region, initial encounter: Secondary | ICD-10-CM | POA: Diagnosis not present

## 2019-09-14 ENCOUNTER — Ambulatory Visit
Admission: RE | Admit: 2019-09-14 | Discharge: 2019-09-14 | Disposition: A | Discharge status: Home / Self Care | Payer: PPO | Source: Ambulatory Visit | Attending: Family Medicine | Admitting: Family Medicine

## 2019-09-14 DIAGNOSIS — I5032 Chronic diastolic (congestive) heart failure: Secondary | ICD-10-CM | POA: Diagnosis not present

## 2019-09-14 DIAGNOSIS — J441 Chronic obstructive pulmonary disease with (acute) exacerbation: Secondary | ICD-10-CM | POA: Diagnosis not present

## 2019-09-14 DIAGNOSIS — W19XXXA Unspecified fall, initial encounter: Secondary | ICD-10-CM

## 2019-09-14 DIAGNOSIS — E890 Postprocedural hypothyroidism: Secondary | ICD-10-CM | POA: Diagnosis not present

## 2019-09-14 DIAGNOSIS — R519 Headache, unspecified: Secondary | ICD-10-CM | POA: Diagnosis not present

## 2019-09-14 DIAGNOSIS — I10 Essential (primary) hypertension: Secondary | ICD-10-CM | POA: Diagnosis not present

## 2019-09-14 DIAGNOSIS — M19041 Primary osteoarthritis, right hand: Secondary | ICD-10-CM | POA: Diagnosis not present

## 2019-09-14 DIAGNOSIS — I509 Heart failure, unspecified: Secondary | ICD-10-CM | POA: Diagnosis not present

## 2019-09-14 DIAGNOSIS — N183 Chronic kidney disease, stage 3 unspecified: Secondary | ICD-10-CM | POA: Diagnosis not present

## 2019-09-14 DIAGNOSIS — I272 Pulmonary hypertension, unspecified: Secondary | ICD-10-CM | POA: Diagnosis not present

## 2019-09-14 DIAGNOSIS — I4891 Unspecified atrial fibrillation: Secondary | ICD-10-CM | POA: Diagnosis not present

## 2019-09-14 DIAGNOSIS — J449 Chronic obstructive pulmonary disease, unspecified: Secondary | ICD-10-CM | POA: Diagnosis not present

## 2019-09-14 DIAGNOSIS — E782 Mixed hyperlipidemia: Secondary | ICD-10-CM | POA: Diagnosis not present

## 2019-09-27 ENCOUNTER — Telehealth: Payer: Self-pay | Admitting: Cardiology

## 2019-09-27 ENCOUNTER — Telehealth: Payer: Self-pay | Admitting: *Deleted

## 2019-09-27 MED ORDER — POTASSIUM CHLORIDE ER 10 MEQ PO TBCR: 10.0000 meq | EXTENDED_RELEASE_TABLET | Freq: Every day | ORAL | 0 refills | Status: DC

## 2019-09-27 MED ORDER — FUROSEMIDE 40 MG PO TABS: 40.0000 mg | ORAL_TABLET | Freq: Two times a day (BID) | ORAL | 0 refills | Status: DC

## 2019-09-27 NOTE — Telephone Encounter (Signed)
Called to speak with pt who reports the MD from [facility] called and spoke with Dr Meda Coffee. A plan has already been put into place and she states she will be coming into the office next week for f/u labs.  She denied any further questions or concerns at this time.

## 2019-09-27 NOTE — Telephone Encounter (Signed)
New message   Pt c/o swelling: STAT is pt has developed SOB within 24 hours  How much weight have you gained and in what time span?up 5 lbs in with a week  1) If swelling, where is the swelling located? In both ankles   Are you currently taking a fluid pill? Yes   2) Are you currently SOB? No   3) Do you have a log of your daily weights (if so, list)? No   4) Have you gained 3 pounds in a day or 5 pounds in a week? No   5) Have you traveled recently? No

## 2019-09-27 NOTE — Telephone Encounter (Signed)
Dorothy Spark, MD  Shellia Cleverly, RN; Jerline Pain, MD Hi Lannette Donath who works with a home health care called for worsening lower extremity edema, I increased her Lasix to 40 mg p.o. twice daily and added potassium 10 mEq daily, however she needs to be seen within a week, would you be able to find her appointment ideally with [name] but if not possible with an extender.   Thank you    Pt is aware of the above information.  Per pt she does not need an RX for either medication at this time and does not want me to sent one into her pharmacy.  She will request any refills she needs at her appt 7/6  With Arnold Long.

## 2019-09-28 NOTE — Telephone Encounter (Signed)
OK with me to work on getting a POC.   She may need a formal walk on pulsed flow O2 to get this done unless she is already on a conserving device with her tanks.

## 2019-09-29 ENCOUNTER — Other Ambulatory Visit: Payer: Self-pay

## 2019-09-29 ENCOUNTER — Encounter (INDEPENDENT_AMBULATORY_CARE_PROVIDER_SITE_OTHER): Payer: PPO | Admitting: Critical Care Medicine

## 2019-09-29 DIAGNOSIS — R0600 Dyspnea, unspecified: Secondary | ICD-10-CM

## 2019-10-02 NOTE — Progress Notes (Signed)
Cardiology Office Note   Date:  10/04/2019   ID:  Kristin Hood, DOB 09/05/1940, MRN [id number]  PCP:  [name], MD  Cardiologist: [name] CC: Follow up edema and weight gain.    History of Present Illness: Kristin Hood is a 79 y.o. female who presents for ongoing assessment and management of persistent atrial fibrillation on chronic anticoagulation, history of prior CVA and treatment of teeth PA for cerebral artery occlusion.  The patient had mild AS noted on prior echo along with LAE.  She has a history of a remote cardiac catheterization with no evidence of CAD.  She also has a history of COPD.  The patient was admitted in 2019 in early 2706 for diastolic CHF exacerbation.  It was noted that she was very debilitated and deconditioned due to chronic back pain.  She was seen via telemedicine phone call (declined care agility video) on 07/12/2019, by Truitt Merle, NP.  At that time she was complaining of shortness of breath with significant mucus production.  She continued to have some mild chest discomfort but was not concerned it was cardiac related as she had already spoken with [name] who had done a work-up.  She follows pulmonary and wears oxygen at night.  At the time of the office visit she was stable.  No changes were made on her therapy.  She was continued on current regimen.  She was to follow-up with PCP and pulmonary.    Unfortunately the patient began to experience worsening shortness of breath and called our office on 09/27/2019.  She had gained 5 pounds in 1 week with complaints of swelling in both ankles.  The patient had been seen by Lurene Shadow, who worked with home health care, and had her Lasix increased to 40 mg twice daily with potassium of 10 mEq daily.  She is here for follow-up to discuss and assess her response to medications.  She has lost 8 pounds since starting higher dose of Lasix 40 mg twice daily.  Unfortunately, about 5 days ago she had a  mechanical fall while trying to move a dog that out of the way.  She fell forward landing on her right wrist, her right knee, and on her face.  I have taken a picture of this and you will find this in the media in her chart.  The patient put ice on her face and waited 3 days before having it checked out as she was on her way to their [place].  She did go to the ED and have a CT scan of her head, her wrist, and her knees.  CT revealed large right frontal scalp hematoma with no skull fractures or signs of intracranial trauma.  Laceration to her right wrist was treated and dressed, her right knee did not show signs of fracture.  She denied any syncope presyncope or dizziness prior to the fall.  Her significant ecchymosis apparently is better than initially noted on visit to ED.  She denies any pain or headaches related to the ecchymosis on her face.  Concerning fluid retention.  She states that the edema in her lower extremities is significantly improved.  When she takes 40 mg of Lasix she does note a large amount of urine output with each urination but she is not complaining of frequency.  She denies any muscle cramps or pain with higher doses of Lasix. She does complain of hair loss that it more prominent.   Past Medical History:  Diagnosis Date  . Acute congestive heart failure ([place])   . Acute diastolic CHF (congestive heart failure) ([place]) 12/30/2016  . Acute respiratory failure with hypoxemia ([place]) 06/09/2016  . Acute respiratory failure with hypoxia ([place]) 12/30/2016  . Anginal pain ([place])   . ARF (acute renal failure) ([place]) 06/11/2016  . Arthritis   . Atrial fibrillation ([place]) 07/2012   Eliquis, rate controlled  . Benign hypertensive heart disease without heart failure 12/25/2012  . Breast cancer ([place]) 2001   left mastectomy  . Chest pain 12/11/2012  . CHF (congestive heart failure) ([place]) 06/10/2016  . Chronic atrial fibrillation ([place]) 11/16/2014  . Chronic diastolic heart failure ([place])  02/13/2014  . Coarse tremors    , Essential  . Complication of anesthesia    SMALLER TUBE FOR INTUBATION AS GAGS ON TUBE  . COPD (chronic obstructive pulmonary disease) ([place])   . COPD exacerbation ([place]) 12/30/2016  . CVA (cerebral vascular accident) ([place]) 2013  . Dysrhythmia 08/20/2012   Atrial Fibrillation  . Essential and other specified forms of tremor 06/18/2012  . Essential hypertension 06/18/2012  . Family history of adverse reaction to anesthesia    mother experienced pain with intubation   . GERD (gastroesophageal reflux disease)    history of   . Hard of hearing   . Heart murmur   . History of chemotherapy 11/13/1998 to 2010   Adriamycin/Docotaxol, Dexorubicin/Taxotere, Femara, Tamoxifen  . History of CVA (cerebrovascular accident) 12/11/2012  . History of hiatal hernia    history of   . Hx of adenomatous colonic polyps 2006   , Benign  . Hypercholesteremia   . Hyperlipidemia 02/13/2014  . Hypertension    , With mild concentric left ventricular hypertrophy  . Long term current use of anticoagulant therapy 12/25/2012  . Mixed dyslipidemia   . Mixed hyperlipidemia 12/25/2012  . Morbid obesity ([place]) 06/18/2012  . Phlebitis and thrombophlebitis of superficial vessels of lower extremities 12/25/2012  . 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 Pure hypercholesterolemia 12/25/2012  . Shingles   . Shortness of breath dyspnea    on exertion   . SOB (shortness of breath) 11/16/2014  . Spider veins   . Stroke ([place]) 11/17/2011   left side brain-speech-TPA  . Thyroid nodule   . Tremor, essential   . Unspecified cerebral artery occlusion with cerebral infarction 11/18/2011  . Valvular sclerosis 09/23/2003   without stenosis  . Varicose veins     Past Surgical History:  Procedure Laterality Date  . APPENDECTOMY  07/10/1957  . BIOPSY THYROID  12/01/2013   ultrasound  . BREAST SURGERY    . BUNIONECTOMY  11/29/1988   bilateral with hammer toes  . CARDIAC CATHETERIZATION  07/2010    , Without significant CAD  . CARDIAC CATHETERIZATION  07/2011   [name]  . CESAREAN SECTION    . CESAREAN SECTION  05/1968, 07/1965  . EYE SURGERY  01/31/2013   eye lid; cataract surgery bilat   . JOINT REPLACEMENT  05/2001   left knee  . JOINT REPLACEMENT  11/2001   right knee  . JOINT REPLACEMENT  01/2008   redo right knee  . KNEE SURGERY  2009   ,Total knee replacement  . LEFT HEART CATHETERIZATION WITH CORONARY ANGIOGRAM N/A 08/14/2011   Procedure: LEFT HEART CATHETERIZATION WITH CORONARY ANGIOGRAM;  Surgeon: [name], MD;  Location[facility] CATH LAB;  Service: Cardiovascular;  Laterality: N/A;  . Left Rotator Cuff    . MASTECTOMY Left 10/19/1998   Radical, left -with  lymph nodes (8 total)  . Reverse bunionectomy  08/29/1993   removal of Tibial Sesamoid-left  . TEE WITHOUT CARDIOVERSION  11/19/2011   Procedure: TRANSESOPHAGEAL ECHOCARDIOGRAM (TEE);  Surgeon: [name], MD;  Location: [facility] ENDOSCOPY;  Service: Cardiovascular;  Laterality: N/A;  . THYROIDECTOMY N/A 03/02/2015   Procedure: TOTAL THYROIDECTOMY;  Surgeon: Armandina Gemma, MD;  Location: [facility] ORS;  Service: General;  Laterality: N/A;  . TONSILLECTOMY    . TOTAL HIP ARTHROPLASTY Left 10/11/2014   Procedure: LEFT TOTAL HIP ARTHROPLASTY ANTERIOR APPROACH;  Surgeon: [name], MD;  Location: [facility] ORS;  Service: Orthopedics;  Laterality: Left;     Current Outpatient Medications  Medication Sig Dispense Refill  . albuterol (VENTOLIN HFA) 108 (90 Base) MCG/ACT inhaler Inhale 2 puffs into the lungs every 4 (four) hours as needed for wheezing or shortness of breath. 8 g 11  . atorvastatin (LIPITOR) 20 MG tablet Take 20 mg by mouth at bedtime.     . budesonide (PULMICORT) 0.5 MG/2ML nebulizer solution Take 0.5 mg by nebulization 2 (two) times daily.    Marland Kitchen doxycycline (VIBRA-TABS) 100 MG tablet Take 1 tablet (100 mg total) by mouth 2 (two) times daily. 10 tablet 0  . ELIQUIS 5 MG TABS tablet TAKE 1 TABLET BY MOUTH TWICE A DAY 180  tablet 2  . fenofibrate 160 MG tablet Take 160 mg by mouth at bedtime.     . fluticasone (FLONASE) 50 MCG/ACT nasal spray Place 2 sprays into both nostrils daily as needed for allergies or rhinitis.     . furosemide (LASIX) 40 MG tablet Take 1 tablet (40 mg total) by mouth 2 (two) times daily.  0  . guaiFENesin (MUCINEX) 600 MG 12 hr tablet Take 600 mg by mouth daily.    Marland Kitchen ipratropium-albuterol (DUONEB) 0.5-2.5 (3) MG/3ML SOLN ipratropium 0.5 mg-albuterol 3 mg (2.5 mg base)/3 mL nebulization soln  USE 1 VIAL(3 ML) VIA NEBULIZER EVERY 4 HOURS AS NEEDED FOR WHEEZING    . ketoconazole (NIZORAL) 2 % cream ketoconazole 2 % topical cream  APPLY TO AFFECTED AREA ONCE DAILY AS NEEDED    . levothyroxine (SYNTHROID) 175 MCG tablet TAKE 1 TABLET BY MOUTH EVERY DAY BEFORE BREAKFAST 90 tablet 3  . loratadine (CLARITIN) 10 MG tablet Take 10 mg by mouth daily as needed for allergies (rash).     . Multiple Vitamin (MULTIVITAMIN WITH MINERALS) TABS tablet Take 1 tablet by mouth daily.     . potassium chloride (KLOR-CON) 10 MEQ tablet Take 1 tablet (10 mEq total) by mouth daily.  0  . propranolol ER (INDERAL LA) 60 MG 24 hr capsule TAKE 2 CAPSULES IN THE MORNING & TAKE 1 CAPSULE IN THE AFTERNOON 270 capsule 2  . Tiotropium Bromide-Olodaterol (STIOLTO RESPIMAT) 2.5-2.5 MCG/ACT AERS Inhale 2 puffs into the lungs daily. 8 g 0   No current facility-administered medications for this visit.    Allergies:   Floxin [ofloxacin]    Social History:  The patient  reports that she quit smoking about 27 years ago. Her smoking use included cigarettes. She has a 60.00 pack-year smoking history. She has never used smokeless tobacco. She reports current alcohol use of about 1.0 standard drink of alcohol per week. She reports that she does not use drugs.   Family History:  The patient's family history includes Aortic stenosis in her mother; Heart attack in her father; Heart disease in her father and mother; Heart failure in her  father.    ROS: All other systems are  reviewed and negative. Unless otherwise mentioned in H&P    PHYSICAL EXAM: VS:  BP 126/80   Pulse (!) 56   Temp (!) 97.2 F (36.2 C)   Ht 5\' 5"  (1.651 m)   Wt 188 lb 12.8 oz (85.6 kg)   SpO2 93%   BMI 31.42 kg/m  , BMI Body mass index is 31.42 kg/m. GEN: Well nourished, well developed, in no acute distress HEENT: Significant ecchymosis of the right side of the face on the forehead, cheeks, jaw and neck, darker on the right than on the left. No edema of the eye orbits.  Neck: no JVD, carotid bruits, or masses Cardiac: IRRR; 2/6 systolic  Murmurs heard best at the LSB no  rubs, or gallops, 2+ pre-tibial edema  Respiratory:  Mild bilateral crackles. No wheezes.  GI: soft, nontender, nondistended, + BS MS: no deformity or atrophy Skin: warm and dry, no rash Neuro:  Strength and sensation are intact Psych: euthymic mood, full affect   EKG:  No EKG completed  Recent Labs: 10/24/2018: B Natriuretic Peptide 498.3 10/28/2018: Magnesium 2.1; TSH 1.827 03/09/2019: Pro B Natriuretic peptide (BNP) 597.0 08/19/2019: ALT 12; BUN 34; Creatinine 1.04; Hemoglobin 10.8; Platelet Count 255; Potassium 3.7; Sodium 140    Lipid Panel    Component Value Date/Time   CHOL 148 11/18/2011 0420   TRIG 101 11/18/2011 0420   HDL 34 (L) 11/18/2011 0420   CHOLHDL 4.4 11/18/2011 0420   VLDL 20 11/18/2011 0420   LDLCALC 94 11/18/2011 0420      Wt Readings from Last 3 Encounters:  10/04/19 188 lb 12.8 oz (85.6 kg)  08/25/19 188 lb 6.4 oz (85.5 kg)  08/22/19 190 lb 9.6 oz (86.5 kg)      Other studies Reviewed: Echocardiogram 11-13-2018: 1. The left ventricle has normal systolic function with an ejection fraction of 60-65%. The cavity size was normal. Left ventricular diastolic function could not be evaluated secondary to atrial fibrillation. Elevated left ventricular end-diastolic  pressure. 2. The right ventricle has normal systolic function. The cavity was  normal. There is no increase in right ventricular wall thickness. Right ventricular systolic pressure is moderately elevated. 3. Left atrial size was severely dilated. 4. Right atrial size was severely dilated. 5. The aortic valve is tricuspid. Moderate thickening of the aortic valve. Moderate calcification of the aortic valve. Aortic valve regurgitation is mild by color flow Doppler. Mild stenosis of the aortic valve. Moderate aortic annular calcification  noted. 6. The mitral valve is degenerative. Mild thickening of the mitral valve leaflet. Mild calcification of the mitral valve leaflet. There is moderate to severe mitral annular calcification present. Mild mitral valve stenosis. 7. The aorta is normal in size and structure. 8. The inferior vena cava was dilated in size with <50% respiratory variability. 9. The interatrial septum appears to be lipomatous.   ASSESSMENT AND PLAN:  1.  Persistent atrial fibrillation: She remains on Eliquis 5 mg twice daily, and propanolol 60 mg 2 capsules in the morning and 1 capsule in the afternoon.  Heart rate is well controlled today.  I will check a CBC.  2.  Chronic diastolic CHF: She has diuresed approximately 8 pounds since increasing her dose to 40 mg twice daily.  She continues to have evidence of fluid overload.  She continues to have 2+ pitting pretibial edema, and 3+ in the ankles and feet.  I will continue Lasix 40 mg twice daily for the next few days, checking a BMET to evaluate kidney  status.  We may change her to Lasix 40 in the morning and 20 in the evening to maintain volume status.  She is aware of need to be on low-sodium diet.  3.  Mechanical fall: The patient has significant amount of ecchymosis on her right side of her face with less so on the left.  She did follow-up and have a CT scan that did not reveal any intracranial abnormalities.  Please see photo in media section.  She is advised on extra caution when walking to avoid future  falls especially being on anticoagulation therapy.  She verbalizes understanding.  She lives with her son and daughter-in-law who are very attentive.  4.  Hypothyroidism: She will continue levothyroxine.  I will check a TSH to evaluate her status.   Current medicines are reviewed at length with the patient today.  I have spent 25 minutes dedicated to the care of this patient on the date of this encounter to include pre-visit review of records, assessment, management and diagnostic testing,with shared decision making.  Labs/ tests ordered today include: BMET, CBC, TSH.   Phill Myron. West Pugh, ANP, AACC   10/04/2019 4:01 PM    [facility] [place] [address] Office [phone] Fax [phone]  Notice: This dictation was prepared with Dragon dictation along with smaller phrase technology. Any transcriptional errors that result from this process are unintentional and may not be corrected upon review.

## 2019-10-04 ENCOUNTER — Ambulatory Visit (INDEPENDENT_AMBULATORY_CARE_PROVIDER_SITE_OTHER): Payer: PPO | Admitting: Adult Health

## 2019-10-04 ENCOUNTER — Telehealth: Payer: Self-pay | Admitting: Adult Health

## 2019-10-04 ENCOUNTER — Encounter: Payer: Self-pay | Admitting: Adult Health

## 2019-10-04 ENCOUNTER — Other Ambulatory Visit: Payer: Self-pay

## 2019-10-04 VITALS — BP 126/80 | HR 56 | Temp 97.2°F | Ht 65.0 in | Wt 181.8 lb

## 2019-10-04 DIAGNOSIS — W19XXXD Unspecified fall, subsequent encounter: Secondary | ICD-10-CM | POA: Diagnosis not present

## 2019-10-04 DIAGNOSIS — I10 Essential (primary) hypertension: Secondary | ICD-10-CM

## 2019-10-04 DIAGNOSIS — Z79899 Other long term (current) drug therapy: Secondary | ICD-10-CM | POA: Diagnosis not present

## 2019-10-04 DIAGNOSIS — E039 Hypothyroidism, unspecified: Secondary | ICD-10-CM | POA: Diagnosis not present

## 2019-10-04 DIAGNOSIS — I4891 Unspecified atrial fibrillation: Secondary | ICD-10-CM | POA: Diagnosis not present

## 2019-10-04 NOTE — Patient Instructions (Signed)
Medication Instructions:  Take- Furosemide 40 mg by mouth twice a day for the next 3 days, office will give you a call for further medications changes on your Lasix  *If you need a refill on your cardiac medications before your next appointment, please call your pharmacy*   Lab Work: BMP, CBC, TSH  If you have labs (blood work) drawn today and your tests are completely normal, you will receive your results only by: Marland Kitchen MyChart Message (if you have MyChart) OR . A paper copy in the mail If you have any lab test that is abnormal or we need to change your treatment, we will call you to review the results.   Testing/Procedures: None Ordered   Follow-Up: At [facility], you and your health needs are our priority.  As part of our continuing mission to provide you with exceptional heart care, we have created designated Provider Care Teams.  These Care Teams include your primary Cardiologist (physician) and Advanced Practice Providers (APPs -  Physician Assistants and Nurse Practitioners) who all work together to provide you with the care you need, when you need it.  We recommend signing up for the patient portal called "MyChart".  Sign up information is provided on this After Visit Summary.  MyChart is used to connect with patients for Virtual Visits (Telemedicine).  Patients are able to view lab/test results, encounter notes, upcoming appointments, etc.  Non-urgent messages can be sent to your provider as well.   To learn more about what you can do with MyChart, go to [email or web address].    Your next appointment:   1 month(s)  The format for your next appointment:   In Person  Provider:   You may see [name], MD or one of the following Advanced Practice Providers on your designated Care Team:    Truitt Merle, NP  Cecilie Kicks, NP  Kathyrn Drown, NP

## 2019-10-04 NOTE — Telephone Encounter (Signed)
Hey, anyway to double check this information from this morning visit?   Thanks!

## 2019-10-04 NOTE — Telephone Encounter (Signed)
    Pt said she is looking her AVS and noticed her weight is at 188 lbs she said that's wrong since she weight herself this morning before she ate and she's at 181.8 lbs.

## 2019-10-05 ENCOUNTER — Encounter: Payer: Self-pay | Admitting: Adult Health

## 2019-10-05 LAB — TSH: TSH: 1.37 u[IU]/mL (ref 0.450–4.500)

## 2019-10-05 LAB — BASIC METABOLIC PANEL
BUN/Creatinine Ratio: 34 — ABNORMAL HIGH (ref 12–28)
BUN: 36 mg/dL — ABNORMAL HIGH (ref 8–27)
CO2: 27 mmol/L (ref 20–29)
Calcium: 9.2 mg/dL (ref 8.7–10.3)
Chloride: 96 mmol/L (ref 96–106)
Creatinine, Ser: 1.06 mg/dL — ABNORMAL HIGH (ref 0.57–1.00)
GFR calc Af Amer: 57 mL/min/{1.73_m2} — ABNORMAL LOW (ref >59)
GFR calc non Af Amer: 50 mL/min/{1.73_m2} — ABNORMAL LOW (ref >59)
Glucose: 88 mg/dL (ref 65–99)
Potassium: 4.5 mmol/L (ref 3.5–5.2)
Sodium: 142 mmol/L (ref 134–144)

## 2019-10-05 LAB — CBC
Hematocrit: 34.1 % (ref 34.0–46.6)
Hemoglobin: 10.7 g/dL — ABNORMAL LOW (ref 11.1–15.9)
MCH: 28.2 pg (ref 26.6–33.0)
MCHC: 31.4 g/dL — ABNORMAL LOW (ref 31.5–35.7)
MCV: 90 fL (ref 79–97)
Platelets: 270 10*3/uL (ref 150–450)
RBC: 3.8 x10E6/uL (ref 3.77–5.28)
RDW: 13.7 % (ref 11.7–15.4)
WBC: 4.3 10*3/uL (ref 3.4–10.8)

## 2019-10-05 NOTE — Telephone Encounter (Signed)
Call pt back and let her know weight was change to 181.8 lbs, pt voice thanks and was very much appreciated for the telephone call and changes.

## 2019-10-13 ENCOUNTER — Telehealth: Payer: Self-pay | Admitting: Cardiology

## 2019-10-13 ENCOUNTER — Other Ambulatory Visit: Payer: Self-pay | Admitting: Internal Medicine

## 2019-10-13 ENCOUNTER — Other Ambulatory Visit: Payer: Self-pay | Admitting: Cardiology

## 2019-10-13 MED ORDER — POTASSIUM CHLORIDE ER 10 MEQ PO TBCR: 10.0000 meq | EXTENDED_RELEASE_TABLET | Freq: Every day | ORAL | 3 refills | Status: AC

## 2019-10-13 NOTE — Telephone Encounter (Signed)
°*  STAT* If patient is at the pharmacy, call can be transferred to refill team.   1. Which medications need to be refilled? (please list name of each medication and dose if known)  potassium chloride (KLOR-CON) 10 MEQ tablet  2. Which pharmacy/location (including street and city if local pharmacy) is medication to be sent to? [facility] - [place], [place] - [address]  3. Do they need a 30 day or 90 day supply? 90  

## 2019-10-13 NOTE — Telephone Encounter (Signed)
Pt's medication was sent to pt's pharmacy as requested. Confirmation received.  °

## 2019-10-14 DIAGNOSIS — J441 Chronic obstructive pulmonary disease with (acute) exacerbation: Secondary | ICD-10-CM | POA: Diagnosis not present

## 2019-10-14 DIAGNOSIS — I272 Pulmonary hypertension, unspecified: Secondary | ICD-10-CM | POA: Diagnosis not present

## 2019-10-14 DIAGNOSIS — I4891 Unspecified atrial fibrillation: Secondary | ICD-10-CM | POA: Diagnosis not present

## 2019-10-14 DIAGNOSIS — E890 Postprocedural hypothyroidism: Secondary | ICD-10-CM | POA: Diagnosis not present

## 2019-10-14 DIAGNOSIS — E782 Mixed hyperlipidemia: Secondary | ICD-10-CM | POA: Diagnosis not present

## 2019-10-14 DIAGNOSIS — I10 Essential (primary) hypertension: Secondary | ICD-10-CM | POA: Diagnosis not present

## 2019-10-14 DIAGNOSIS — I509 Heart failure, unspecified: Secondary | ICD-10-CM | POA: Diagnosis not present

## 2019-10-14 DIAGNOSIS — J449 Chronic obstructive pulmonary disease, unspecified: Secondary | ICD-10-CM | POA: Diagnosis not present

## 2019-10-14 DIAGNOSIS — M19041 Primary osteoarthritis, right hand: Secondary | ICD-10-CM | POA: Diagnosis not present

## 2019-10-14 DIAGNOSIS — N183 Chronic kidney disease, stage 3 unspecified: Secondary | ICD-10-CM | POA: Diagnosis not present

## 2019-10-14 DIAGNOSIS — I5032 Chronic diastolic (congestive) heart failure: Secondary | ICD-10-CM | POA: Diagnosis not present

## 2019-10-19 ENCOUNTER — Other Ambulatory Visit: Payer: Self-pay | Admitting: Cardiology

## 2019-10-19 NOTE — Telephone Encounter (Signed)
Prescription refill request for Eliquis received.  Last office visit: Purcell Nails, 10/04/2019 Scr: 1.06, 10/04/2019 Age: 79 y.o. Weight: 82.5 kg   Prescription refill sent.

## 2019-10-25 ENCOUNTER — Ambulatory Visit (INDEPENDENT_AMBULATORY_CARE_PROVIDER_SITE_OTHER): Payer: PPO

## 2019-10-25 ENCOUNTER — Ambulatory Visit: Payer: PPO | Admitting: Pulmonary Disease

## 2019-10-25 ENCOUNTER — Other Ambulatory Visit: Payer: Self-pay

## 2019-10-25 ENCOUNTER — Encounter: Payer: Self-pay | Admitting: Pulmonary Disease

## 2019-10-25 VITALS — BP 112/68 | HR 77 | Temp 97.8°F | Ht 65.0 in | Wt 182.8 lb

## 2019-10-25 DIAGNOSIS — J90 Pleural effusion, not elsewhere classified: Secondary | ICD-10-CM

## 2019-10-25 DIAGNOSIS — J449 Chronic obstructive pulmonary disease, unspecified: Secondary | ICD-10-CM

## 2019-10-25 DIAGNOSIS — R0602 Shortness of breath: Secondary | ICD-10-CM

## 2019-10-25 DIAGNOSIS — C8580 Other specified types of non-Hodgkin lymphoma, unspecified site: Secondary | ICD-10-CM | POA: Diagnosis not present

## 2019-10-25 DIAGNOSIS — J189 Pneumonia, unspecified organism: Secondary | ICD-10-CM | POA: Diagnosis not present

## 2019-10-25 LAB — COMPREHENSIVE METABOLIC PANEL
ALT: 10 U/L (ref 0–35)
AST: 23 U/L (ref 0–37)
Albumin: 3.5 g/dL (ref 3.5–5.2)
Alkaline Phosphatase: 32 U/L — ABNORMAL LOW (ref 39–117)
BUN: 35 mg/dL — ABNORMAL HIGH (ref 6–23)
CO2: 32 mEq/L (ref 19–32)
Calcium: 9.3 mg/dL (ref 8.4–10.5)
Chloride: 98 mEq/L (ref 96–112)
Creatinine, Ser: 0.98 mg/dL (ref 0.40–1.20)
GFR: 54.57 mL/min — ABNORMAL LOW (ref >60.00)
Glucose, Bld: 88 mg/dL (ref 70–99)
Potassium: 4.1 mEq/L (ref 3.5–5.1)
Sodium: 136 mEq/L (ref 135–145)
Total Bilirubin: 1 mg/dL (ref 0.2–1.2)
Total Protein: 8 g/dL (ref 6.0–8.3)

## 2019-10-25 NOTE — H&P (View-Only) (Signed)
@Patient  ID: Kristin Hood, female    DOB: 07/21/1940, 79 y.o.   MRN: [id number]  Chief Complaint  Patient presents with  . Follow-up    short of breath, copd follow up    Referring provider: [name], MD  HPI:  79 year old female former smoker followed in our office for COPD, allergic rhinitis, dyspnea on exertion, right-sided pleural effusion  PMH: Lymphoma (followed by Dr. [name] with oncology), hyperlipidemia, diastolic heart failure, chronic A. fib, chronic kidney disease stage III, morbid obesity Smoker/ Smoking History: Former smoker Maintenance: Stiolto Respimat, Pulmicort nebs Pt of: Dr. [name]  10/25/2019  - Visit   79 year old female former smoker followed in our office for COPD, allergic rhinitis, dyspnea on exertion and right-sided pleural effusion.  She was last seen in our office in May/2021 with Dr. [name].  Plan of care from that office visit was for her to remain on Stiolto Respimat and Pulmicort nebulized treatments.  Follow-up in 3 months with a chest x-ray.  Patient may benefit from a thoracentesis with right-sided pleural effusion.  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  She does not want recurrent chemotherapy.  She is being followed by Dr. [name].  Patient was asked to restart fluticasone nasal spray as well as loratadine for help with management of congestion and allergic rhinitis symptoms.   Patient has noticed that she had worsened shortness of breath over the past couple of days.  She presented to our office to further review and evaluate this.  Patient presented to our office today reporting that she fell 1 month ago.  She has a large bruise on her face.  She is followed up with primary care is recommended a head CT.  She has completed this.  She reports that she tripped over her dog in bed.  She has had worsening shortness of breath as well as increased orthopnea.  She is unsure what may be causing this.  She continues to utilize [employer]  Respimat as well as budesonide nebulized meds.  She reports she is using 2-1/2 L of O2 at night.  She is wondering if she can qualify for a portable oxygen concentrator.  She is interested in obtaining a portable oxygen concentrator [place] because and that could also be used as a nighttime concentrator.  Patient currently is bringing tanks but runs out of tanks throughout the night.  She is trying to avoid having [place].  Patient walked in office today and did not qualify for POC.  She did not have any oxygen desaturations on room air.  Questionaires / Pulmonary Flowsheets:   ACT:  No flowsheet data found.  MMRC: mMRC Dyspnea Scale mMRC Score  03/09/2019 3    Epworth:  No flowsheet data found.  Tests:   09/09/2019-chest x-ray-cardiomegaly without edema, no notable change in small bilateral pleural effusions, greater on the right  08/19/2019-CT chest with contrast-mild progression of lymphoma, widespread adenopathy in the chest and retroperitoneum, stable to mildly increased as detailed, moderate splenomegaly, mildly increased, moderate dependent right pleural effusion increased, cardiomegaly, mosaic attenuation throughout both lungs unchanged, differential includes pulmonary vascular disease and/or air trapping  10/27/2018-echocardiogram-LV ejection fraction 60 to 65%, right ventricle is normal systolic function, right ventricular systolic pressure is mildly elevated  12/28/2014-pulmonary function test-FVC 1.97 (67% predicted), postbronchodilator ratio 80, postbronchodilator FEV1 1.64 (75% predicted), no bronchodilator  response, mid flow reversibility, TLC 4.48 (86% predicted), DLCO 15.66 (61% predicted) Minimal obstructive airways disease, moderate diffusion defect disproportionate reduction of ERV which is consistent with patient's body habitus  FENO:  No  results found for: NITRICOXIDE  PFT: PFT Results Latest Ref Rng & Units 12/28/2014  FVC-Pre L 1.97  FVC-Predicted Pre % 67  FVC-Post L 2.05  FVC-Predicted Post % 70  Pre FEV1/FVC % % 76  Post FEV1/FCV % % 80  FEV1-Pre L 1.50  FEV1-Predicted Pre % 68  FEV1-Post L 1.64  DLCO UNC% % 61  DLCO COR %Predicted % 88  TLC L 4.48  TLC % Predicted % 86  RV % Predicted % 95    WALK:  SIX MIN WALK 09/29/2019 03/09/2019  Supplimental Oxygen during Test? (L/min) No No  Tech Comments: Patient had a moderate pace walk, lowest sat 92%, patient did have some mild SOB Patient was able to complete 1.5 laps. She was able to complete the walk at steady pace while using her walker. She stated that she had some lower back pain but this was going on before her appt. Did complain of SOB after first lap but she wanted to continue on. She completed half of the 2nd lap before she wanted to return back to her room.    Imaging: DG Chest 2 View  Result Date: 10/25/2019 CLINICAL DATA:  Shortness of breath EXAM: CHEST - 2 VIEW COMPARISON:  September 09, 2019 chest radiograph.  Chest CT Aug 19, 2019 FINDINGS: There are persistent pleural effusions bilaterally. There is somewhat less consolidation in the right base compared to previous study. Residual opacity in the right base may represent atelectasis or residual infiltrate. There is central peribronchial thickening. No new opacity evident. Heart is mildly enlarged with pulmonary vascularity normal. There is calcification in the mitral annulus. There is aortic atherosclerosis. No adenopathy is appreciable by radiography. IMPRESSION: Stable cardiac silhouette. Pleural effusions bilaterally. Less opacity right base compared to prior study. Suspect atelectatic change with potential residual pneumonia in the right base. No new opacity compared to prior study. Central peribronchial thickening likely represents bronchitis. Aortic Atherosclerosis (ICD10-I70.0). Electronically Signed   By:  Lowella Grip III M.D.   On: 10/25/2019 12:28    Lab Results:  CBC    Component Value Date/Time   WBC 4.3 10/04/2019 1602   WBC 3.8 (L) 08/19/2019 0853   WBC 6.9 03/09/2019 1215   RBC 3.80 10/04/2019 1602   RBC 3.79 (L) 08/19/2019 0853   HGB 10.7 (L) 10/04/2019 1602   HGB 11.5 (L) 07/15/2016 0905   HCT 34.1 10/04/2019 1602   HCT 34.3 (L) 07/15/2016 0905   PLT 270 10/04/2019 1602   MCV 90 10/04/2019 1602   MCV 86.5 07/15/2016 0905   MCH 28.2 10/04/2019 1602   MCH 28.5 08/19/2019 0853   MCHC 31.4 (L) 10/04/2019 1602   MCHC 30.5 08/19/2019 0853   RDW 13.7 10/04/2019 1602   RDW 15.4 (H) 07/15/2016 0905   LYMPHSABS 0.5 (L) 08/19/2019 0853   LYMPHSABS 0.8 01/06/2017 1122   LYMPHSABS 0.3 (L) 07/15/2016 0905   MONOABS 0.6 08/19/2019 0853   MONOABS 0.4 07/15/2016 0905   EOSABS 0.0 08/19/2019 0853   EOSABS 0.1 01/06/2017 1122   BASOSABS 0.0 08/19/2019 0853   BASOSABS 0.0 01/06/2017 1122   BASOSABS 0.0 07/15/2016 0905    BMET    Component Value Date/Time   NA 142 10/04/2019 1602   NA 141 07/15/2016 0906   K 4.5 10/04/2019 1602  K 4.0 07/15/2016 0906   CL 96 10/04/2019 1602   CO2 27 10/04/2019 1602   CO2 25 07/15/2016 0906   GLUCOSE 88 10/04/2019 1602   GLUCOSE 87 08/19/2019 0853   GLUCOSE 115 07/15/2016 0906   BUN 36 (H) 10/04/2019 1602   BUN 20.7 07/15/2016 0906   CREATININE 1.06 (H) 10/04/2019 1602   CREATININE 1.04 (H) 08/19/2019 0853   CREATININE 1.1 07/15/2016 0906   CALCIUM 9.2 10/04/2019 1602   CALCIUM 9.6 07/15/2016 0906   GFRNONAA 50 (L) 10/04/2019 1602   GFRNONAA 51 (L) 08/19/2019 0853   GFRAA 57 (L) 10/04/2019 1602   GFRAA 59 (L) 08/19/2019 0853    BNP    Component Value Date/Time   BNP 498.3 (H) 10/24/2018 0354    ProBNP    Component Value Date/Time   PROBNP 597.0 (H) 03/09/2019 1215    Specialty Problems      Pulmonary Problems   Shortness of breath   Pulmonary nodules   Allergic rhinitis   COPD mixed type (HCC)   Nocturnal  hypoxemia   Pleural effusion on right      Allergies  Allergen Reactions  . Floxin [Ofloxacin] Nausea Only and Other (See Comments)    Dizziness, Vertigo     Immunization History  Administered Date(s) Administered  . Influenza Split 11/29/2014  . Influenza, High Dose Seasonal PF 12/09/2016  . Influenza,inj,Quad PF,6+ Mos 11/16/2015  . Influenza-Unspecified 11/30/2013, 01/15/2018  . PFIZER SARS-COV-2 Vaccination 04/22/2019, 05/13/2019  . Pneumococcal Conjugate-13 06/07/2014  . Pneumococcal-Unspecified 02/26/2011  . Zoster 07/01/2010    Past Medical History:  Diagnosis Date  . Acute congestive heart failure ([place])   . Acute diastolic CHF (congestive heart failure) ([place]) 12/30/2016  . Acute respiratory failure with hypoxemia ([place]) 06/09/2016  . Acute respiratory failure with hypoxia ([place]) 12/30/2016  . Anginal pain ([place])   . ARF (acute renal failure) ([place]) 06/11/2016  . Arthritis   . Atrial fibrillation ([place]) 07/2012   Eliquis, rate controlled  . Benign hypertensive heart disease without heart failure 12/25/2012  . Breast cancer ([place]) 2001   left mastectomy  . Chest pain 12/11/2012  . CHF (congestive heart failure) ([place]) 06/10/2016  . Chronic atrial fibrillation ([place]) 11/16/2014  . Chronic diastolic heart failure ([place]) 02/13/2014  . Coarse tremors    , Essential  . Complication of anesthesia    SMALLER TUBE FOR INTUBATION AS GAGS ON TUBE  . COPD (chronic obstructive pulmonary disease) ([place])   . COPD exacerbation ([place]) 12/30/2016  . CVA (cerebral vascular accident) ([place]) 2013  . Dysrhythmia 08/20/2012   Atrial Fibrillation  . Essential and other specified forms of tremor 06/18/2012  . Essential hypertension 06/18/2012  . Family history of adverse reaction to anesthesia    mother experienced pain with intubation   . GERD (gastroesophageal reflux disease)    history of   . Hard of hearing   . Heart murmur   . History of chemotherapy 11/13/1998 to 2010   Adriamycin/Docotaxol,  Dexorubicin/Taxotere, Femara, Tamoxifen  . History of CVA (cerebrovascular accident) 12/11/2012  . History of hiatal hernia    history of   . Hx of adenomatous colonic polyps 2006   , Benign  . Hypercholesteremia   . Hyperlipidemia 02/13/2014  . Hypertension    , With mild concentric left ventricular hypertrophy  . Long term current use of anticoagulant therapy 12/25/2012  . Mixed dyslipidemia   . Mixed hyperlipidemia 12/25/2012  . Morbid obesity ([place]) 06/18/2012  . Phlebitis and thrombophlebitis  of superficial vessels of lower extremities 12/25/2012  . 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 Pure hypercholesterolemia 12/25/2012  . Shingles   . Shortness of breath dyspnea    on exertion   . SOB (shortness of breath) 11/16/2014  . Spider veins   . Stroke ([place]) 11/17/2011   left side brain-speech-TPA  . Thyroid nodule   . Tremor, essential   . Unspecified cerebral artery occlusion with cerebral infarction 11/18/2011  . Valvular sclerosis 09/23/2003   without stenosis  . Varicose veins     Tobacco History: Social History   Tobacco Use  Smoking Status Former Smoker  . Packs/day: 2.00  . Years: 30.00  . Pack years: 60.00  . Types: Cigarettes  . Quit date: 01/30/1992  . Years since quitting: 27.7  Smokeless Tobacco Never Used   Counseling given: Not Answered   Continue to not smoke  Outpatient Encounter Medications as of 10/25/2019  Medication Sig  . albuterol (VENTOLIN HFA) 108 (90 Base) MCG/ACT inhaler Inhale 2 puffs into the lungs every 4 (four) hours as needed for wheezing or shortness of breath.  Marland Kitchen atorvastatin (LIPITOR) 20 MG tablet Take 20 mg by mouth at bedtime.   . budesonide (PULMICORT) 0.5 MG/2ML nebulizer solution Take 0.5 mg by nebulization 2 (two) times daily.  Marland Kitchen doxycycline (VIBRA-TABS) 100 MG tablet Take 1 tablet (100 mg total) by mouth 2 (two) times daily.  Marland Kitchen ELIQUIS 5 MG TABS tablet TAKE 1 TABLET BY MOUTH TWICE DAILY  . fenofibrate 160 MG tablet Take 160 mg  by mouth at bedtime.   . fluticasone (FLONASE) 50 MCG/ACT nasal spray Place 2 sprays into both nostrils daily as needed for allergies or rhinitis.   . furosemide (LASIX) 40 MG tablet Take 1 tablet (40 mg total) by mouth 2 (two) times daily.  Marland Kitchen guaiFENesin (MUCINEX) 600 MG 12 hr tablet Take 600 mg by mouth daily.  Marland Kitchen ipratropium-albuterol (DUONEB) 0.5-2.5 (3) MG/3ML SOLN ipratropium 0.5 mg-albuterol 3 mg (2.5 mg base)/3 mL nebulization soln  USE 1 VIAL(3 ML) VIA NEBULIZER EVERY 4 HOURS AS NEEDED FOR WHEEZING  . ketoconazole (NIZORAL) 2 % cream ketoconazole 2 % topical cream  APPLY TO AFFECTED AREA ONCE DAILY AS NEEDED  . levothyroxine (SYNTHROID) 175 MCG tablet TAKE 1 TABLET BY MOUTH EVERY DAYBEFORE BREAKFAST  . loratadine (CLARITIN) 10 MG tablet Take 10 mg by mouth daily as needed for allergies (rash).   . Multiple Vitamin (MULTIVITAMIN WITH MINERALS) TABS tablet Take 1 tablet by mouth daily.   . potassium chloride (KLOR-CON) 10 MEQ tablet Take 1 tablet (10 mEq total) by mouth daily.  . propranolol ER (INDERAL LA) 60 MG 24 hr capsule TAKE 2 CAPSULES IN THE MORNING & TAKE 1 CAPSULE IN THE AFTERNOON  . Tiotropium Bromide-Olodaterol (STIOLTO RESPIMAT) 2.5-2.5 MCG/ACT AERS Inhale 2 puffs into the lungs daily.   No facility-administered encounter medications on file as of 10/25/2019.     Review of Systems  Review of Systems  Constitutional: Positive for fatigue. Negative for activity change and fever.  HENT: Negative for sinus pressure, sinus pain and sore throat.   Respiratory: Positive for chest tightness and shortness of breath. Negative for cough and wheezing.        Increased orthopnea   Cardiovascular: Positive for leg swelling. Negative for chest pain and palpitations.  Gastrointestinal: Negative for diarrhea, nausea and vomiting.  Musculoskeletal: Negative for arthralgias.  Neurological: Negative for dizziness.  Psychiatric/Behavioral: Negative for sleep disturbance. The patient is not  nervous/anxious.  Physical Exam  BP 112/68 (BP Location: Left Arm, Cuff Size: Normal)   Pulse 77   Temp 97.8 F (36.6 C) (Oral)   Ht 5\' 5"  (1.651 m)   Wt 182 lb 12.8 oz (82.9 kg)   SpO2 94%   BMI 30.42 kg/m   Wt Readings from Last 5 Encounters:  10/25/19 182 lb 12.8 oz (82.9 kg)  10/04/19 181 lb 12.8 oz (82.5 kg)  08/25/19 188 lb 6.4 oz (85.5 kg)  08/22/19 190 lb 9.6 oz (86.5 kg)  07/12/19 192 lb (87.1 kg)    BMI Readings from Last 5 Encounters:  10/25/19 30.42 kg/m  10/04/19 30.25 kg/m  08/25/19 31.35 kg/m  08/22/19 31.72 kg/m  07/12/19 31.95 kg/m     Physical Exam Vitals and nursing note reviewed.  Constitutional:      General: She is not in acute distress.    Appearance: Normal appearance. She is normal weight.  HENT:     Head: Normocephalic and atraumatic.     Right Ear: External ear normal.     Left Ear: External ear normal.     Nose: Nose normal. No congestion.     Mouth/Throat:     Mouth: Mucous membranes are moist.     Pharynx: Oropharynx is clear.  Eyes:     Pupils: Pupils are equal, round, and reactive to light.  Cardiovascular:     Rate and Rhythm: Normal rate and regular rhythm.     Pulses: Normal pulses.     Heart sounds: Normal heart sounds. No murmur heard.   Pulmonary:     Breath sounds: No decreased air movement. Examination of the right-lower field reveals decreased breath sounds. Decreased breath sounds present. No wheezing or rales.  Musculoskeletal:     Cervical back: Normal range of motion.     Right lower leg: Edema (2+) present.     Left lower leg: Edema (2+) present.  Skin:    General: Skin is warm and dry.     Capillary Refill: Capillary refill takes less than 2 seconds.  Neurological:     General: No focal deficit present.     Mental Status: She is alert and oriented to person, place, and time. Mental status is at baseline.     Gait: Gait normal.  Psychiatric:        Mood and Affect: Mood normal.        Behavior:  Behavior normal.        Thought Content: Thought content normal.        Judgment: Judgment normal.       Assessment & Plan:   COPD mixed type ([place]) Plan: Continue Stiolto Respimat Continue budesonide nebs Walk today in office Chest x-ray today 6-week follow-up with our office  Pleural effusion on right Patient with worsened orthopnea Increased dyspnea on exertion Diminished breath sounds of right base  Plan: We will repeat chest x-ray today Could consider therapeutic thoracentesis/in the outpatient setting This could be related to patient's lymphoma Reviewed with patient the patient may need a thoracentesis, she is agreeable to this, we would have to hold her Eliquis 48 hours before  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 [name] with oncology  Shortness of breath Worsening shortness of breath No audible wheezing on exam today Diminished breath sounds in right lung base Worsened orthopnea  Plan: Lab work today Chest x-ray today Walk today in office  I have contacted the DME company [employer] to see if there  is any options available to help the patient with nighttime oxygen use [place]  I have left a message with [employer] DME Representative Melissa.   Return in about 6 weeks (around 12/06/2019), or if symptoms worsen or fail to improve, for Follow up with Dr. [name].   Lauraine Rinne, NP 10/25/2019   This appointment required 44 minutes of patient care (this includes precharting, chart review, review of results, face-to-face care, etc.).

## 2019-10-25 NOTE — Assessment & Plan Note (Signed)
Patient with worsened orthopnea Increased dyspnea on exertion Diminished breath sounds of right base  Plan: We will repeat chest x-ray today Could consider therapeutic thoracentesis/in the outpatient setting This could be related to patient's lymphoma Reviewed with patient the patient may need a thoracentesis, she is agreeable to this, we would have to hold her Eliquis 48 hours before

## 2019-10-25 NOTE — Assessment & Plan Note (Signed)
Worsening shortness of breath No audible wheezing on exam today Diminished breath sounds in right lung base Worsened orthopnea  Plan: Lab work today Chest x-ray today Walk today in office

## 2019-10-25 NOTE — Patient Instructions (Addendum)
You were seen today by Lauraine Rinne, NP  for:   1. Pleural effusion on right  - DG Chest 2 View; Future  Suspect this may have worsened  We will obtain a chest x-ray today, if pleural effusion has worsened especially given clinical symptoms today we may proceed forward with a outpatient thoracentesis as discussed  We will also repeat blood work to see if we need to increase her diuretics.  I have ordered the x-ray as well as the blood work as stat.  If symptoms worsen in the acute timeframe before you have received results from [place].  Would recommend that you seek emergent evaluation at an urgent care or emergency room.  2. COPD mixed type (HCC)  Stiolto Respimat inhaler >>>2 puffs daily >>>Take this no matter what >>>This is not a rescue inhaler  Continue budesonide nebs every 12 hours  Walk today in office  Note your daily symptoms > remember "red flags" for COPD:   >>>Increase in cough >>>increase in sputum production >>>increase in shortness of breath or activity  intolerance.   If you notice these symptoms, please call the office to be seen.   3. Marginal zone lymphoma (HCC)  Continue follow-up with Dr. [name]   We recommend today:  Orders Placed This Encounter  Procedures  . DG Chest 2 View    Standing Status:   Future    Standing Expiration Date:   02/25/2020    Order Specific Question:   Reason for Exam (SYMPTOM  OR DIAGNOSIS REQUIRED)    Answer:   doe, pleural effusion    Order Specific Question:   Preferred imaging location?    Answer:   Internal    Order Specific Question:   Radiology Contrast Protocol - do NOT remove file path    Answer:   [email or web address]   Orders Placed This Encounter  Procedures  . DG Chest 2 View   No orders of the defined types were placed in this encounter.   Follow Up:    Return in about 6 weeks (around 12/06/2019), or if symptoms worsen or fail to improve, for Follow up with Dr.  [name].   Please do your part to reduce the spread of COVID-19:      Reduce your risk of any infection  and COVID19 by using the similar precautions used for avoiding the common cold or flu:  Marland Kitchen Wash your hands often with soap and warm water for at least 20 seconds.  If soap and water are not readily available, use an alcohol-based hand sanitizer with at least 60% alcohol.  . If coughing or sneezing, cover your mouth and nose by coughing or sneezing into the elbow areas of your shirt or coat, into a tissue or into your sleeve (not your hands). Langley Gauss A MASK when in public  . Avoid shaking hands with others and consider head nods or verbal greetings only. . Avoid touching your eyes, nose, or mouth with unwashed hands.  . Avoid close contact with people who are sick. . Avoid places or events with large numbers of people in one location, like concerts or sporting events. . If you have some symptoms but not all symptoms, continue to monitor at home and seek medical attention if your symptoms worsen. . If you are having a medical emergency, call 911.   ADDITIONAL HEALTHCARE OPTIONS FOR PATIENTS  [facility] / e-Visit: [email or web address]         [facility]: [phone]  [facility]  [facility]: [phone]                   [facility]: [phone]     It is flu season:   >>> Best ways to protect herself from the flu: Receive the yearly flu vaccine, practice good hand hygiene washing with soap and also using hand sanitizer when available, eat a nutritious meals, get adequate rest, hydrate appropriately   Please contact the office if your symptoms worsen or you have concerns that you are not improving.   Thank you for choosing [facility] for your healthcare, and for allowing [place] to partner with you on your healthcare journey. I am thankful to be able to provide care to you today.   Wyn Quaker FNP-C

## 2019-10-25 NOTE — Progress Notes (Addendum)
@Patient  ID: Kristin Hood, female    DOB: 1940-11-10, 78 y.o.   MRN: [id number]  Chief Complaint  Patient presents with  . Follow-up    short of breath, copd follow up    Referring provider: [name], MD  HPI:  79 year old female former smoker followed in our office for COPD, allergic rhinitis, dyspnea on exertion, right-sided pleural effusion  PMH: Lymphoma (followed by Dr. [name] with oncology), hyperlipidemia, diastolic heart failure, chronic A. fib, chronic kidney disease stage III, morbid obesity Smoker/ Smoking History: Former smoker Maintenance: Stiolto Respimat, Pulmicort nebs Pt of: Dr. [name]  10/25/2019  - Visit   79 year old female former smoker followed in our office for COPD, allergic rhinitis, dyspnea on exertion and right-sided pleural effusion.  She was last seen in our office in May/2021 with Dr. [name].  Plan of care from that office visit was for her to remain on Stiolto Respimat and Pulmicort nebulized treatments.  Follow-up in 3 months with a chest x-ray.  Patient may benefit from a thoracentesis with right-sided pleural effusion.  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  She does not want recurrent chemotherapy.  She is being followed by Dr. [name].  Patient was asked to restart fluticasone nasal spray as well as loratadine for help with management of congestion and allergic rhinitis symptoms.   Patient has noticed that she had worsened shortness of breath over the past couple of days.  She presented to our office to further review and evaluate this.  Patient presented to our office today reporting that she fell 1 month ago.  She has a large bruise on her face.  She is followed up with primary care is recommended a head CT.  She has completed this.  She reports that she tripped over her dog in bed.  She has had worsening shortness of breath as well as increased orthopnea.  She is unsure what may be causing this.  She continues to utilize [employer]  Respimat as well as budesonide nebulized meds.  She reports she is using 2-1/2 L of O2 at night.  She is wondering if she can qualify for a portable oxygen concentrator.  She is interested in obtaining a portable oxygen concentrator [place] because and that could also be used as a nighttime concentrator.  Patient currently is bringing tanks but runs out of tanks throughout the night.  She is trying to avoid having [place].  Patient walked in office today and did not qualify for POC.  She did not have any oxygen desaturations on room air.  Questionaires / Pulmonary Flowsheets:   ACT:  No flowsheet data found.  MMRC: mMRC Dyspnea Scale mMRC Score  03/09/2019 3    Epworth:  No flowsheet data found.  Tests:   09/09/2019-chest x-ray-cardiomegaly without edema, no notable change in small bilateral pleural effusions, greater on the right  08/19/2019-CT chest with contrast-mild progression of lymphoma, widespread adenopathy in the chest and retroperitoneum, stable to mildly increased as detailed, moderate splenomegaly, mildly increased, moderate dependent right pleural effusion increased, cardiomegaly, mosaic attenuation throughout both lungs unchanged, differential includes pulmonary vascular disease and/or air trapping  10/27/2018-echocardiogram-LV ejection fraction 60 to 65%, right ventricle is normal systolic function, right ventricular systolic pressure is mildly elevated  12/28/2014-pulmonary function test-FVC 1.97 (67% predicted), postbronchodilator ratio 80, postbronchodilator FEV1 1.64 (75% predicted), no bronchodilator  response, mid flow reversibility, TLC 4.48 (86% predicted), DLCO 15.66 (61% predicted) Minimal obstructive airways disease, moderate diffusion defect disproportionate reduction of ERV which is consistent with patient's body habitus  FENO:  No  results found for: NITRICOXIDE  PFT: PFT Results Latest Ref Rng & Units 12/28/2014  FVC-Pre L 1.97  FVC-Predicted Pre % 67  FVC-Post L 2.05  FVC-Predicted Post % 70  Pre FEV1/FVC % % 76  Post FEV1/FCV % % 80  FEV1-Pre L 1.50  FEV1-Predicted Pre % 68  FEV1-Post L 1.64  DLCO UNC% % 61  DLCO COR %Predicted % 88  TLC L 4.48  TLC % Predicted % 86  RV % Predicted % 95    WALK:  SIX MIN WALK 09/29/2019 03/09/2019  Supplimental Oxygen during Test? (L/min) No No  Tech Comments: Patient had a moderate pace walk, lowest sat 92%, patient did have some mild SOB Patient was able to complete 1.5 laps. She was able to complete the walk at steady pace while using her walker. She stated that she had some lower back pain but this was going on before her appt. Did complain of SOB after first lap but she wanted to continue on. She completed half of the 2nd lap before she wanted to return back to her room.    Imaging: DG Chest 2 View  Result Date: 10/25/2019 CLINICAL DATA:  Shortness of breath EXAM: CHEST - 2 VIEW COMPARISON:  September 09, 2019 chest radiograph.  Chest CT Aug 19, 2019 FINDINGS: There are persistent pleural effusions bilaterally. There is somewhat less consolidation in the right base compared to previous study. Residual opacity in the right base may represent atelectasis or residual infiltrate. There is central peribronchial thickening. No new opacity evident. Heart is mildly enlarged with pulmonary vascularity normal. There is calcification in the mitral annulus. There is aortic atherosclerosis. No adenopathy is appreciable by radiography. IMPRESSION: Stable cardiac silhouette. Pleural effusions bilaterally. Less opacity right base compared to prior study. Suspect atelectatic change with potential residual pneumonia in the right base. No new opacity compared to prior study. Central peribronchial thickening likely represents bronchitis. Aortic Atherosclerosis (ICD10-I70.0). Electronically Signed   By:  Lowella Grip III M.D.   On: 10/25/2019 12:28    Lab Results:  CBC    Component Value Date/Time   WBC 4.3 10/04/2019 1602   WBC 3.8 (L) 08/19/2019 0853   WBC 6.9 03/09/2019 1215   RBC 3.80 10/04/2019 1602   RBC 3.79 (L) 08/19/2019 0853   HGB 10.7 (L) 10/04/2019 1602   HGB 11.5 (L) 07/15/2016 0905   HCT 34.1 10/04/2019 1602   HCT 34.3 (L) 07/15/2016 0905   PLT 270 10/04/2019 1602   MCV 90 10/04/2019 1602   MCV 86.5 07/15/2016 0905   MCH 28.2 10/04/2019 1602   MCH 28.5 08/19/2019 0853   MCHC 31.4 (L) 10/04/2019 1602   MCHC 30.5 08/19/2019 0853   RDW 13.7 10/04/2019 1602   RDW 15.4 (H) 07/15/2016 0905   LYMPHSABS 0.5 (L) 08/19/2019 0853   LYMPHSABS 0.8 01/06/2017 1122   LYMPHSABS 0.3 (L) 07/15/2016 0905   MONOABS 0.6 08/19/2019 0853   MONOABS 0.4 07/15/2016 0905   EOSABS 0.0 08/19/2019 0853   EOSABS 0.1 01/06/2017 1122   BASOSABS 0.0 08/19/2019 0853   BASOSABS 0.0 01/06/2017 1122   BASOSABS 0.0 07/15/2016 0905    BMET    Component Value Date/Time   NA 142 10/04/2019 1602   NA 141 07/15/2016 0906   K 4.5 10/04/2019 1602  K 4.0 07/15/2016 0906   CL 96 10/04/2019 1602   CO2 27 10/04/2019 1602   CO2 25 07/15/2016 0906   GLUCOSE 88 10/04/2019 1602   GLUCOSE 87 08/19/2019 0853   GLUCOSE 115 07/15/2016 0906   BUN 36 (H) 10/04/2019 1602   BUN 20.7 07/15/2016 0906   CREATININE 1.06 (H) 10/04/2019 1602   CREATININE 1.04 (H) 08/19/2019 0853   CREATININE 1.1 07/15/2016 0906   CALCIUM 9.2 10/04/2019 1602   CALCIUM 9.6 07/15/2016 0906   GFRNONAA 50 (L) 10/04/2019 1602   GFRNONAA 51 (L) 08/19/2019 0853   GFRAA 57 (L) 10/04/2019 1602   GFRAA 59 (L) 08/19/2019 0853    BNP    Component Value Date/Time   BNP 498.3 (H) 10/24/2018 0354    ProBNP    Component Value Date/Time   PROBNP 597.0 (H) 03/09/2019 1215    Specialty Problems      Pulmonary Problems   Shortness of breath   Pulmonary nodules   Allergic rhinitis   COPD mixed type (HCC)   Nocturnal  hypoxemia   Pleural effusion on right      Allergies  Allergen Reactions  . Floxin [Ofloxacin] Nausea Only and Other (See Comments)    Dizziness, Vertigo     Immunization History  Administered Date(s) Administered  . Influenza Split 11/29/2014  . Influenza, High Dose Seasonal PF 12/09/2016  . Influenza,inj,Quad PF,6+ Mos 11/16/2015  . Influenza-Unspecified 11/30/2013, 01/15/2018  . PFIZER SARS-COV-2 Vaccination 04/22/2019, 05/13/2019  . Pneumococcal Conjugate-13 06/07/2014  . Pneumococcal-Unspecified 02/26/2011  . Zoster 07/01/2010    Past Medical History:  Diagnosis Date  . Acute congestive heart failure ([place])   . Acute diastolic CHF (congestive heart failure) ([place]) 12/30/2016  . Acute respiratory failure with hypoxemia ([place]) 06/09/2016  . Acute respiratory failure with hypoxia ([place]) 12/30/2016  . Anginal pain ([place])   . ARF (acute renal failure) ([place]) 06/11/2016  . Arthritis   . Atrial fibrillation ([place]) 07/2012   Eliquis, rate controlled  . Benign hypertensive heart disease without heart failure 12/25/2012  . Breast cancer ([place]) 2001   left mastectomy  . Chest pain 12/11/2012  . CHF (congestive heart failure) ([place]) 06/10/2016  . Chronic atrial fibrillation ([place]) 11/16/2014  . Chronic diastolic heart failure ([place]) 02/13/2014  . Coarse tremors    , Essential  . Complication of anesthesia    SMALLER TUBE FOR INTUBATION AS GAGS ON TUBE  . COPD (chronic obstructive pulmonary disease) ([place])   . COPD exacerbation ([place]) 12/30/2016  . CVA (cerebral vascular accident) ([place]) 2013  . Dysrhythmia 08/20/2012   Atrial Fibrillation  . Essential and other specified forms of tremor 06/18/2012  . Essential hypertension 06/18/2012  . Family history of adverse reaction to anesthesia    mother experienced pain with intubation   . GERD (gastroesophageal reflux disease)    history of   . Hard of hearing   . Heart murmur   . History of chemotherapy 11/13/1998 to 2010   Adriamycin/Docotaxol,  Dexorubicin/Taxotere, Femara, Tamoxifen  . History of CVA (cerebrovascular accident) 12/11/2012  . History of hiatal hernia    history of   . Hx of adenomatous colonic polyps 2006   , Benign  . Hypercholesteremia   . Hyperlipidemia 02/13/2014  . Hypertension    , With mild concentric left ventricular hypertrophy  . Long term current use of anticoagulant therapy 12/25/2012  . Mixed dyslipidemia   . Mixed hyperlipidemia 12/25/2012  . Morbid obesity ([place]) 06/18/2012  . Phlebitis and thrombophlebitis  of superficial vessels of lower extremities 12/25/2012  . 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 Pure hypercholesterolemia 12/25/2012  . Shingles   . Shortness of breath dyspnea    on exertion   . SOB (shortness of breath) 11/16/2014  . Spider veins   . Stroke ([place]) 11/17/2011   left side brain-speech-TPA  . Thyroid nodule   . Tremor, essential   . Unspecified cerebral artery occlusion with cerebral infarction 11/18/2011  . Valvular sclerosis 09/23/2003   without stenosis  . Varicose veins     Tobacco History: Social History   Tobacco Use  Smoking Status Former Smoker  . Packs/day: 2.00  . Years: 30.00  . Pack years: 60.00  . Types: Cigarettes  . Quit date: 01/30/1992  . Years since quitting: 27.7  Smokeless Tobacco Never Used   Counseling given: Not Answered   Continue to not smoke  Outpatient Encounter Medications as of 10/25/2019  Medication Sig  . albuterol (VENTOLIN HFA) 108 (90 Base) MCG/ACT inhaler Inhale 2 puffs into the lungs every 4 (four) hours as needed for wheezing or shortness of breath.  Marland Kitchen atorvastatin (LIPITOR) 20 MG tablet Take 20 mg by mouth at bedtime.   . budesonide (PULMICORT) 0.5 MG/2ML nebulizer solution Take 0.5 mg by nebulization 2 (two) times daily.  Marland Kitchen doxycycline (VIBRA-TABS) 100 MG tablet Take 1 tablet (100 mg total) by mouth 2 (two) times daily.  Marland Kitchen ELIQUIS 5 MG TABS tablet TAKE 1 TABLET BY MOUTH TWICE DAILY  . fenofibrate 160 MG tablet Take 160 mg  by mouth at bedtime.   . fluticasone (FLONASE) 50 MCG/ACT nasal spray Place 2 sprays into both nostrils daily as needed for allergies or rhinitis.   . furosemide (LASIX) 40 MG tablet Take 1 tablet (40 mg total) by mouth 2 (two) times daily.  Marland Kitchen guaiFENesin (MUCINEX) 600 MG 12 hr tablet Take 600 mg by mouth daily.  Marland Kitchen ipratropium-albuterol (DUONEB) 0.5-2.5 (3) MG/3ML SOLN ipratropium 0.5 mg-albuterol 3 mg (2.5 mg base)/3 mL nebulization soln  USE 1 VIAL(3 ML) VIA NEBULIZER EVERY 4 HOURS AS NEEDED FOR WHEEZING  . ketoconazole (NIZORAL) 2 % cream ketoconazole 2 % topical cream  APPLY TO AFFECTED AREA ONCE DAILY AS NEEDED  . levothyroxine (SYNTHROID) 175 MCG tablet TAKE 1 TABLET BY MOUTH EVERY DAYBEFORE BREAKFAST  . loratadine (CLARITIN) 10 MG tablet Take 10 mg by mouth daily as needed for allergies (rash).   . Multiple Vitamin (MULTIVITAMIN WITH MINERALS) TABS tablet Take 1 tablet by mouth daily.   . potassium chloride (KLOR-CON) 10 MEQ tablet Take 1 tablet (10 mEq total) by mouth daily.  . propranolol ER (INDERAL LA) 60 MG 24 hr capsule TAKE 2 CAPSULES IN THE MORNING & TAKE 1 CAPSULE IN THE AFTERNOON  . Tiotropium Bromide-Olodaterol (STIOLTO RESPIMAT) 2.5-2.5 MCG/ACT AERS Inhale 2 puffs into the lungs daily.   No facility-administered encounter medications on file as of 10/25/2019.     Review of Systems  Review of Systems  Constitutional: Positive for fatigue. Negative for activity change and fever.  HENT: Negative for sinus pressure, sinus pain and sore throat.   Respiratory: Positive for chest tightness and shortness of breath. Negative for cough and wheezing.        Increased orthopnea   Cardiovascular: Positive for leg swelling. Negative for chest pain and palpitations.  Gastrointestinal: Negative for diarrhea, nausea and vomiting.  Musculoskeletal: Negative for arthralgias.  Neurological: Negative for dizziness.  Psychiatric/Behavioral: Negative for sleep disturbance. The patient is not  nervous/anxious.  Physical Exam  BP 112/68 (BP Location: Left Arm, Cuff Size: Normal)   Pulse 77   Temp 97.8 F (36.6 C) (Oral)   Ht 5\' 5"  (1.651 m)   Wt 182 lb 12.8 oz (82.9 kg)   SpO2 94%   BMI 30.42 kg/m   Wt Readings from Last 5 Encounters:  10/25/19 182 lb 12.8 oz (82.9 kg)  10/04/19 181 lb 12.8 oz (82.5 kg)  08/25/19 188 lb 6.4 oz (85.5 kg)  08/22/19 190 lb 9.6 oz (86.5 kg)  07/12/19 192 lb (87.1 kg)    BMI Readings from Last 5 Encounters:  10/25/19 30.42 kg/m  10/04/19 30.25 kg/m  08/25/19 31.35 kg/m  08/22/19 31.72 kg/m  07/12/19 31.95 kg/m     Physical Exam Vitals and nursing note reviewed.  Constitutional:      General: She is not in acute distress.    Appearance: Normal appearance. She is normal weight.  HENT:     Head: Normocephalic and atraumatic.     Right Ear: External ear normal.     Left Ear: External ear normal.     Nose: Nose normal. No congestion.     Mouth/Throat:     Mouth: Mucous membranes are moist.     Pharynx: Oropharynx is clear.  Eyes:     Pupils: Pupils are equal, round, and reactive to light.  Cardiovascular:     Rate and Rhythm: Normal rate and regular rhythm.     Pulses: Normal pulses.     Heart sounds: Normal heart sounds. No murmur heard.   Pulmonary:     Breath sounds: No decreased air movement. Examination of the right-lower field reveals decreased breath sounds. Decreased breath sounds present. No wheezing or rales.  Musculoskeletal:     Cervical back: Normal range of motion.     Right lower leg: Edema (2+) present.     Left lower leg: Edema (2+) present.  Skin:    General: Skin is warm and dry.     Capillary Refill: Capillary refill takes less than 2 seconds.  Neurological:     General: No focal deficit present.     Mental Status: She is alert and oriented to person, place, and time. Mental status is at baseline.     Gait: Gait normal.  Psychiatric:        Mood and Affect: Mood normal.        Behavior:  Behavior normal.        Thought Content: Thought content normal.        Judgment: Judgment normal.       Assessment & Plan:   COPD mixed type ([place]) Plan: Continue Stiolto Respimat Continue budesonide nebs Walk today in office Chest x-ray today 6-week follow-up with our office  Pleural effusion on right Patient with worsened orthopnea Increased dyspnea on exertion Diminished breath sounds of right base  Plan: We will repeat chest x-ray today Could consider therapeutic thoracentesis/in the outpatient setting This could be related to patient's lymphoma Reviewed with patient the patient may need a thoracentesis, she is agreeable to this, we would have to hold her Eliquis 48 hours before  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 [name] with oncology  Shortness of breath Worsening shortness of breath No audible wheezing on exam today Diminished breath sounds in right lung base Worsened orthopnea  Plan: Lab work today Chest x-ray today Walk today in office  I have contacted the DME company [employer] to see if there  is any options available to help the patient with nighttime oxygen use [place]  I have left a message with [employer] Representative Melissa.   Return in about 6 weeks (around 12/06/2019), or if symptoms worsen or fail to improve, for Follow up with Dr. [name].   Lauraine Rinne, NP 10/25/2019   This appointment required 44 minutes of patient care (this includes precharting, chart review, review of results, face-to-face care, etc.).

## 2019-10-25 NOTE — Assessment & Plan Note (Signed)
Plan: Continue Stiolto Respimat Continue budesonide nebs Walk today in office Chest x-ray today 6-week follow-up with our office

## 2019-10-25 NOTE — Assessment & Plan Note (Signed)
Progressed on CT imaging Being followed by Dr. [name] with oncology

## 2019-10-26 ENCOUNTER — Telehealth: Payer: Self-pay | Admitting: Pulmonary Disease

## 2019-10-26 LAB — PRO B NATRIURETIC PEPTIDE: NT-Pro BNP: 4134 pg/mL — ABNORMAL HIGH (ref 0–738)

## 2019-10-26 MED ORDER — AMOXICILLIN-POT CLAVULANATE 875-125 MG PO TABS: 1.0000 | ORAL_TABLET | Freq: Two times a day (BID) | ORAL | 0 refills | Status: AC

## 2019-10-26 NOTE — Progress Notes (Signed)
Tried calling the pt- line rings multiple times with no answer and then msg states enter remote code, no VM. WCB.

## 2019-10-26 NOTE — Telephone Encounter (Signed)
Called and spoke with pt letting her know the results of the labwork and cxr and stated to her that we were going to call abx to pharmacy for her. Pt verbalized understanding.  While speaking with pt, she wanted to know if Aaron Edelman had been able to get anything worked out for her to receive O2 [place].  Aaron Edelman, please advise.

## 2019-10-26 NOTE — Telephone Encounter (Signed)
Thank you for speaking with the patient.  I have contacted [employer].  They informed me today that they are limited to what the patient will be able to qualify for.  They are unable to provide a portable POC for nighttime oxygen.  Patient still would have the opportunity to either purchase one out of pocket or we can continue to evaluate the patient when she comes in to clinic to see if oxygen levels drop below 88% and then she could qualify for portable oxygen concentrator based off of her needs of oxygen with exertion.  Unfortunately as of right now she would either have to use her backup tanks that she has been doing or [place]yn [place], FNP

## 2019-10-27 NOTE — Telephone Encounter (Signed)
Called and spoke with pt letting her know the info stated by Aaron Edelman in regards to his call with [employer] and she verbalized understanding. Nothing further needed.

## 2019-11-01 ENCOUNTER — Other Ambulatory Visit: Payer: Self-pay

## 2019-11-01 ENCOUNTER — Encounter: Payer: Self-pay | Admitting: Cardiology

## 2019-11-01 ENCOUNTER — Ambulatory Visit: Payer: PPO | Admitting: Cardiology

## 2019-11-01 VITALS — BP 110/70 | HR 85 | Ht 65.0 in | Wt 180.6 lb

## 2019-11-01 DIAGNOSIS — I10 Essential (primary) hypertension: Secondary | ICD-10-CM

## 2019-11-01 DIAGNOSIS — W19XXXD Unspecified fall, subsequent encounter: Secondary | ICD-10-CM

## 2019-11-01 DIAGNOSIS — I4821 Permanent atrial fibrillation: Secondary | ICD-10-CM | POA: Diagnosis not present

## 2019-11-01 NOTE — Patient Instructions (Signed)
Medication Instructions:  The current medical regimen is effective;  continue present plan and medications.  *If you need a refill on your cardiac medications before your next appointment, please call your pharmacy*  Follow-Up: At [facility], you and your health needs are our priority.  As part of our continuing mission to provide you with exceptional heart care, we have created designated Provider Care Teams.  These Care Teams include your primary Cardiologist (physician) and Advanced Practice Providers (APPs -  Physician Assistants and Nurse Practitioners) who all work together to provide you with the care you need, when you need it.  We recommend signing up for the patient portal called "MyChart".  Sign up information is provided on this After Visit Summary.  MyChart is used to connect with patients for Virtual Visits (Telemedicine).  Patients are able to view lab/test results, encounter notes, upcoming appointments, etc.  Non-urgent messages can be sent to your provider as well.   To learn more about what you can do with MyChart, go to [email or web address].    Your next appointment:   6 month(s)  The format for your next appointment:   In Person  Provider:   Melina Copa and 1 yr with Dr [name].   Thank you for choosing [facility]!!

## 2019-11-01 NOTE — Progress Notes (Signed)
Cardiology Office Note:    Date:  11/01/2019   ID:  Kristin Hood, DOB 07-10-1940, MRN [id number]  PCP:  [name], MD  [facility] Cardiologist:  [name], MD  [facility] Electrophysiologist:  None   Referring MD: [name], MD     History of Present Illness:    Kristin Hood is a 79 y.o. 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 [name] here for follow-up.  She had a fall approximately 1 month ago.  Worsening orthopnea.  Pulmonary note reviewed from Wyn Quaker, NP.  During 6-minute walk test in office she did not have any oxygen desaturations on room air.  Frustrated by her shortness of breath.  Her facial bruising is improving on the left side of her face.     Past Medical History:  Diagnosis Date  . Acute congestive heart failure ([place])   . Acute diastolic CHF (congestive heart failure) ([place]) 12/30/2016  . Acute respiratory failure with hypoxemia ([place]) 06/09/2016  . Acute respiratory failure with hypoxia ([place]) 12/30/2016  . Anginal pain ([place])   . ARF (acute renal failure) ([place]) 06/11/2016  . Arthritis   . Atrial fibrillation ([place]) 07/2012   Eliquis, rate controlled  . Benign hypertensive heart disease without heart failure 12/25/2012  . Breast cancer ([place]) 2001   left mastectomy  . Chest pain 12/11/2012  . CHF (congestive heart failure) ([place]) 06/10/2016  . Chronic atrial fibrillation ([place]) 11/16/2014  . Chronic diastolic heart failure ([place]) 02/13/2014  . Coarse tremors    , Essential  . Complication of anesthesia    SMALLER TUBE FOR INTUBATION AS GAGS ON TUBE  . COPD (chronic obstructive pulmonary disease) ([place])   . COPD exacerbation ([place]) 12/30/2016  . CVA (cerebral vascular accident) ([place]) 2013  . Dysrhythmia 08/20/2012   Atrial Fibrillation  . Essential and other specified forms of tremor 06/18/2012  . Essential hypertension  06/18/2012  . Family history of adverse reaction to anesthesia    mother experienced pain with intubation   . GERD (gastroesophageal reflux disease)    history of   . Hard of hearing   . Heart murmur   . History of chemotherapy 11/13/1998 to 2010   Adriamycin/Docotaxol, Dexorubicin/Taxotere, Femara, Tamoxifen  . History of CVA (cerebrovascular accident) 12/11/2012  . History of hiatal hernia    history of   . Hx of adenomatous colonic polyps 2006   , Benign  . Hypercholesteremia   . Hyperlipidemia 02/13/2014  . Hypertension    , With mild concentric left ventricular hypertrophy  . Long term current use of anticoagulant therapy 12/25/2012  . Mixed dyslipidemia   . Mixed hyperlipidemia 12/25/2012  . Morbid obesity ([place]) 06/18/2012  . Phlebitis and thrombophlebitis of superficial vessels of lower extremities 12/25/2012  . 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 Pure hypercholesterolemia 12/25/2012  . Shingles   . Shortness of breath dyspnea    on exertion   . SOB (shortness of breath) 11/16/2014  . Spider veins   . Stroke ([place]) 11/17/2011   left side brain-speech-TPA  . Thyroid nodule   . Tremor, essential   . Unspecified cerebral artery occlusion with cerebral infarction 11/18/2011  . Valvular sclerosis 09/23/2003   without stenosis  . Varicose veins     Past Surgical History:  Procedure Laterality Date  . APPENDECTOMY  07/10/1957  . BIOPSY THYROID  12/01/2013   ultrasound  . BREAST SURGERY    .  BUNIONECTOMY  11/29/1988   bilateral with hammer toes  . CARDIAC CATHETERIZATION  07/2010   , Without significant CAD  . CARDIAC CATHETERIZATION  07/2011   [name]  . CESAREAN SECTION    . CESAREAN SECTION  05/1968, 07/1965  . EYE SURGERY  01/31/2013   eye lid; cataract surgery bilat   . JOINT REPLACEMENT  05/2001   left knee  . JOINT REPLACEMENT  11/2001   right knee  . JOINT REPLACEMENT  01/2008   redo right knee  . KNEE SURGERY  2009   ,Total knee replacement  . LEFT  HEART CATHETERIZATION WITH CORONARY ANGIOGRAM N/A 08/14/2011   Procedure: LEFT HEART CATHETERIZATION WITH CORONARY ANGIOGRAM;  Surgeon: [name], MD;  Location[facility] CATH LAB;  Service: Cardiovascular;  Laterality: N/A;  . Left Rotator Cuff    . MASTECTOMY Left 10/19/1998   Radical, left -with lymph nodes (8 total)  . Reverse bunionectomy  08/29/1993   removal of Tibial Sesamoid-left  . TEE WITHOUT CARDIOVERSION  11/19/2011   Procedure: TRANSESOPHAGEAL ECHOCARDIOGRAM (TEE);  Surgeon: [name], MD;  Location: [facility] ENDOSCOPY;  Service: Cardiovascular;  Laterality: N/A;  . THYROIDECTOMY N/A 03/02/2015   Procedure: TOTAL THYROIDECTOMY;  Surgeon: Armandina Gemma, MD;  Location: [facility] ORS;  Service: General;  Laterality: N/A;  . TONSILLECTOMY    . TOTAL HIP ARTHROPLASTY Left 10/11/2014   Procedure: LEFT TOTAL HIP ARTHROPLASTY ANTERIOR APPROACH;  Surgeon: [name], MD;  Location: [facility] ORS;  Service: Orthopedics;  Laterality: Left;    Current Medications: Current Meds  Medication Sig  . albuterol (VENTOLIN HFA) 108 (90 Base) MCG/ACT inhaler Inhale 2 puffs into the lungs every 4 (four) hours as needed for wheezing or shortness of breath.  Marland Kitchen amoxicillin-clavulanate (AUGMENTIN) 875-125 MG tablet Take 1 tablet by mouth 2 (two) times daily.  Marland Kitchen atorvastatin (LIPITOR) 20 MG tablet Take 20 mg by mouth at bedtime.   . budesonide (PULMICORT) 0.5 MG/2ML nebulizer solution Take 0.5 mg by nebulization 2 (two) times daily.  Marland Kitchen doxycycline (VIBRA-TABS) 100 MG tablet Take 1 tablet (100 mg total) by mouth 2 (two) times daily.  Marland Kitchen ELIQUIS 5 MG TABS tablet TAKE 1 TABLET BY MOUTH TWICE DAILY  . fenofibrate 160 MG tablet Take 160 mg by mouth at bedtime.   . fluticasone (FLONASE) 50 MCG/ACT nasal spray Place 2 sprays into both nostrils daily as needed for allergies or rhinitis.   . furosemide (LASIX) 40 MG tablet Take 1 tablet (40 mg total) by mouth 2 (two) times daily.  Marland Kitchen guaiFENesin (MUCINEX) 600 MG 12 hr tablet Take 600 mg  by mouth daily.  Marland Kitchen ipratropium-albuterol (DUONEB) 0.5-2.5 (3) MG/3ML SOLN ipratropium 0.5 mg-albuterol 3 mg (2.5 mg base)/3 mL nebulization soln  USE 1 VIAL(3 ML) VIA NEBULIZER EVERY 4 HOURS AS NEEDED FOR WHEEZING  . ketoconazole (NIZORAL) 2 % cream ketoconazole 2 % topical cream  APPLY TO AFFECTED AREA ONCE DAILY AS NEEDED  . levothyroxine (SYNTHROID) 175 MCG tablet TAKE 1 TABLET BY MOUTH EVERY DAYBEFORE BREAKFAST  . loratadine (CLARITIN) 10 MG tablet Take 10 mg by mouth daily as needed for allergies (rash).   . Multiple Vitamin (MULTIVITAMIN WITH MINERALS) TABS tablet Take 1 tablet by mouth daily.   . potassium chloride (KLOR-CON) 10 MEQ tablet Take 1 tablet (10 mEq total) by mouth daily.  . propranolol ER (INDERAL LA) 60 MG 24 hr capsule TAKE 2 CAPSULES IN THE MORNING & TAKE 1 CAPSULE IN THE AFTERNOON  . Tiotropium Bromide-Olodaterol (STIOLTO RESPIMAT) 2.5-2.5  MCG/ACT AERS Inhale 2 puffs into the lungs daily.     Allergies:   Floxin [ofloxacin]   Social History   Socioeconomic History  . Marital status: Widowed    Spouse name: Not on file  . Number of children: Not on file  . Years of education: Not on file  . Highest education level: Not on file  Occupational History  . Occupation: retired  Tobacco Use  . Smoking status: Former Smoker    Packs/day: 2.00    Years: 30.00    Pack years: 60.00    Types: Cigarettes    Quit date: 01/30/1992    Years since quitting: 27.7  . Smokeless tobacco: Never Used  Vaping Use  . Vaping Use: Never used  Substance and Sexual Activity  . Alcohol use: Yes    Alcohol/week: 1.0 standard drink    Types: 1 Standard drinks or equivalent per week    Comment: wine occassionally  . Drug use: No  . Sexual activity: Not on file  Other Topics Concern  . Not on file  Social History Narrative  . Not on file   Social Determinants of Health   Financial Resource Strain:   . Difficulty of Paying Living Expenses:   Food Insecurity:   . Worried About  Charity fundraiser in the Last Year:   . Arboriculturist in the Last Year:   Transportation Needs:   . Film/video editor (Medical):   Marland Kitchen Lack of Transportation (Non-Medical):   Physical Activity:   . Days of Exercise per Week:   . Minutes of Exercise per Session:   Stress:   . Feeling of Stress :   Social Connections:   . Frequency of Communication with Friends and Family:   . Frequency of Social Gatherings with Friends and Family:   . Attends Religious Services:   . Active Member of Clubs or Organizations:   . Attends Archivist Meetings:   Marland Kitchen Marital Status:      Family History: The patient's family history includes Aortic stenosis in her mother; Heart attack in her father; Heart disease in her father and mother; Heart failure in her father.  ROS:   Please see the history of present illness.     All other systems reviewed and are negative.  EKGs/Labs/Other Studies Reviewed:    The following studies were reviewed today:   Chest x-ray reviewed shows small bilateral pleural effusions greater on the right.  Echocardiogram on 10/27/2018 shows EF 60 to 65% with normal RV function slightly elevated RV pressures.  Pulmonary function study shows 75% FEV1  EKG:  EKG is  ordered today.  The ekg ordered today demonstrates atrial fibrillation heart rate 85 bpm  Recent Labs: 10/04/2019: Hemoglobin 10.7; Platelets 270; TSH 1.370 10/25/2019: ALT 10; BUN 35; Creatinine, Ser 0.98; NT-Pro BNP 4,134; Potassium 4.1; Sodium 136  Recent Lipid Panel    Component Value Date/Time   CHOL 148 11/18/2011 0420   TRIG 101 11/18/2011 0420   HDL 34 (L) 11/18/2011 0420   CHOLHDL 4.4 11/18/2011 0420   VLDL 20 11/18/2011 0420   LDLCALC 94 11/18/2011 0420    Physical Exam:    VS:  BP 110/70   Pulse 85   Ht 5\' 5"  (1.651 m)   Wt 180 lb 9.6 oz (81.9 kg)   SpO2 92%   BMI 30.05 kg/m     Wt Readings from Last 3 Encounters:  11/01/19 180 lb 9.6 oz (81.9 kg)  10/25/19 182 lb 12.8 oz  (82.9 kg)  10/04/19 181 lb 12.8 oz (82.5 kg)     GEN:  Well nourished, well developed in no acute distress HEENT: Right greater than left facial ecchymoses NECK: No JVD; No carotid bruits LYMPHATICS: No lymphadenopathy CARDIAC: irreg, no murmurs, rubs, gallops RESPIRATORY: Decreased breath sounds right base ABDOMEN: Soft, non-tender, non-distended MUSCULOSKELETAL:  1+ edema; No deformity  SKIN: Warm and dry NEUROLOGIC:  Alert and oriented x 3 PSYCHIATRIC:  Normal affect   ASSESSMENT:    1. Permanent atrial fibrillation ([place])   2. Essential hypertension   3. Injury due to fall, subsequent encounter    PLAN:    In order of problems listed above:  Chronic diastolic heart failure -Lasix dose 40 mg twice a day as above.  Okay to continue.  Continuing to monitor renal function.  Low-sodium diet.  Has had difficulty with weights fluctuating.  Persistent atrial fibrillation -On Eliquis 5 mg twice a day for anticoagulation.  Has had a recent fall, thankfully, no internal bleeding.  Facial bruising noted. -Propranolol heart rate controlled.  Marginal zone lymphoma -Dr. [name], progression on CT scan noted.  COPD -Inhalers per pulmonary.  Also playing a role in her dyspnea on exertion.  Moderate right pleural effusions -Chest x-ray reviewed. CT in May 2021 personally reviewed. Moderate right.  Fairly sizable on CT scan in May.  Chest x-ray unchanged.  Consider thoracentesis.  Would be okay to hold Eliquis if necessary.  Mechanical fall -Understands importance of being careful with ambulation.  Tripped over her dog's bed.  Scalp hematoma on CT.  Lives with her daughter-in-law very attentive.   6 months with Hinton Dyer, 1 year with me   Medication Adjustments/Labs and Tests Ordered: Current medicines are reviewed at length with the patient today.  Concerns regarding medicines are outlined above.  Orders Placed This Encounter  Procedures  . EKG 12-Lead   No orders of the defined  types were placed in this encounter.   Patient Instructions  Medication Instructions:  The current medical regimen is effective;  continue present plan and medications.  *If you need a refill on your cardiac medications before your next appointment, please call your pharmacy*  Follow-Up: At [facility], you and your health needs are our priority.  As part of our continuing mission to provide you with exceptional heart care, we have created designated Provider Care Teams.  These Care Teams include your primary Cardiologist (physician) and Advanced Practice Providers (APPs -  Physician Assistants and Nurse Practitioners) who all work together to provide you with the care you need, when you need it.  We recommend signing up for the patient portal called "MyChart".  Sign up information is provided on this After Visit Summary.  MyChart is used to connect with patients for Virtual Visits (Telemedicine).  Patients are able to view lab/test results, encounter notes, upcoming appointments, etc.  Non-urgent messages can be sent to your provider as well.   To learn more about what you can do with MyChart, go to [email or web address].    Your next appointment:   6 month(s)  The format for your next appointment:   In Person  Provider:   Melina Copa and 1 yr with Dr [name].   Thank you for choosing [facility]!!        Signed, [name], MD  11/01/2019 5:19 PM    [place]

## 2019-11-03 DIAGNOSIS — I4891 Unspecified atrial fibrillation: Secondary | ICD-10-CM | POA: Diagnosis not present

## 2019-11-03 DIAGNOSIS — E890 Postprocedural hypothyroidism: Secondary | ICD-10-CM | POA: Diagnosis not present

## 2019-11-03 DIAGNOSIS — J441 Chronic obstructive pulmonary disease with (acute) exacerbation: Secondary | ICD-10-CM | POA: Diagnosis not present

## 2019-11-03 DIAGNOSIS — I5032 Chronic diastolic (congestive) heart failure: Secondary | ICD-10-CM | POA: Diagnosis not present

## 2019-11-03 DIAGNOSIS — N183 Chronic kidney disease, stage 3 unspecified: Secondary | ICD-10-CM | POA: Diagnosis not present

## 2019-11-03 DIAGNOSIS — E782 Mixed hyperlipidemia: Secondary | ICD-10-CM | POA: Diagnosis not present

## 2019-11-03 DIAGNOSIS — Z853 Personal history of malignant neoplasm of breast: Secondary | ICD-10-CM | POA: Diagnosis not present

## 2019-11-03 DIAGNOSIS — I509 Heart failure, unspecified: Secondary | ICD-10-CM | POA: Diagnosis not present

## 2019-11-03 DIAGNOSIS — M19041 Primary osteoarthritis, right hand: Secondary | ICD-10-CM | POA: Diagnosis not present

## 2019-11-03 DIAGNOSIS — I10 Essential (primary) hypertension: Secondary | ICD-10-CM | POA: Diagnosis not present

## 2019-11-04 NOTE — Telephone Encounter (Signed)
  We can work on setting up next Friday as an outpatient at [facility] procedure room (through Endoscopy), Dr Carlis Abbott to perform. Please work on scheduling through [facility]. Adding Lauren to this thread to assist.   She will need to stop her eliquis 48h prior.

## 2019-11-04 NOTE — Telephone Encounter (Signed)
Patient has been cleared by cardiology, Dr. [name], to have her right lung drained. She has been advised it is ok to stop Eliquis for the procedure. She continues to have shortness of breath. Please advise.

## 2019-11-07 NOTE — Telephone Encounter (Signed)
Lauren please advise on what is needed from clinical staff to ensure proper scheduling of this procedure.  I am unsure of our current office protocol for scheduling these procedures.  Thanks!

## 2019-11-07 NOTE — Telephone Encounter (Signed)
Called spoke with Larene Beach at [facility]. Patient scheduled for 1400 with Dr. Carlis Abbott on 11/11/19. Patient was instructed to hold Eliquis 48 hours prior to surgery.  Patient also scheduled for Covid testing on 11/08/19 at 1040. Patient instructed to take someone with them to drive them home after procedure.  Patient voiced back understanding of above information.   Will route to Dr. [name] and Dr. Carlis Abbott as FYI Nothing further needed at this time.

## 2019-11-08 ENCOUNTER — Other Ambulatory Visit (HOSPITAL_COMMUNITY)
Admission: RE | Admit: 2019-11-08 | Discharge: 2019-11-08 | Disposition: A | Discharge status: Home / Self Care | Payer: PPO | Source: Ambulatory Visit | Attending: Critical Care Medicine | Admitting: Critical Care Medicine

## 2019-11-08 DIAGNOSIS — Z01812 Encounter for preprocedural laboratory examination: Secondary | ICD-10-CM | POA: Insufficient documentation

## 2019-11-08 DIAGNOSIS — Z20822 Contact with and (suspected) exposure to covid-19: Secondary | ICD-10-CM | POA: Insufficient documentation

## 2019-11-08 LAB — SARS CORONAVIRUS 2 (TAT 6-24 HRS): SARS Coronavirus 2: NEGATIVE

## 2019-11-10 ENCOUNTER — Encounter: Payer: Self-pay | Admitting: Internal Medicine

## 2019-11-11 ENCOUNTER — Ambulatory Visit (HOSPITAL_COMMUNITY)
Admission: RE | Admit: 2019-11-11 | Discharge: 2019-11-11 | Disposition: A | Discharge status: Home / Self Care | Payer: PPO | Attending: Pulmonary Disease | Admitting: Pulmonary Disease

## 2019-11-11 ENCOUNTER — Encounter (HOSPITAL_COMMUNITY)
Admission: RE | Disposition: A | Discharge status: Home / Self Care | Payer: Self-pay | Source: Home / Self Care | Attending: Pulmonary Disease

## 2019-11-11 ENCOUNTER — Ambulatory Visit (HOSPITAL_COMMUNITY): Payer: PPO

## 2019-11-11 DIAGNOSIS — Z7951 Long term (current) use of inhaled steroids: Secondary | ICD-10-CM | POA: Diagnosis not present

## 2019-11-11 DIAGNOSIS — I130 Hypertensive heart and chronic kidney disease with heart failure and stage 1 through stage 4 chronic kidney disease, or unspecified chronic kidney disease: Secondary | ICD-10-CM | POA: Diagnosis not present

## 2019-11-11 DIAGNOSIS — J449 Chronic obstructive pulmonary disease, unspecified: Secondary | ICD-10-CM | POA: Insufficient documentation

## 2019-11-11 DIAGNOSIS — E782 Mixed hyperlipidemia: Secondary | ICD-10-CM | POA: Insufficient documentation

## 2019-11-11 DIAGNOSIS — Z8673 Personal history of transient ischemic attack (TIA), and cerebral infarction without residual deficits: Secondary | ICD-10-CM | POA: Insufficient documentation

## 2019-11-11 DIAGNOSIS — E890 Postprocedural hypothyroidism: Secondary | ICD-10-CM | POA: Insufficient documentation

## 2019-11-11 DIAGNOSIS — Z79899 Other long term (current) drug therapy: Secondary | ICD-10-CM | POA: Diagnosis not present

## 2019-11-11 DIAGNOSIS — E785 Hyperlipidemia, unspecified: Secondary | ICD-10-CM | POA: Insufficient documentation

## 2019-11-11 DIAGNOSIS — I482 Chronic atrial fibrillation, unspecified: Secondary | ICD-10-CM | POA: Diagnosis not present

## 2019-11-11 DIAGNOSIS — Z9221 Personal history of antineoplastic chemotherapy: Secondary | ICD-10-CM | POA: Insufficient documentation

## 2019-11-11 DIAGNOSIS — C8590 Non-Hodgkin lymphoma, unspecified, unspecified site: Secondary | ICD-10-CM | POA: Insufficient documentation

## 2019-11-11 DIAGNOSIS — I5033 Acute on chronic diastolic (congestive) heart failure: Secondary | ICD-10-CM | POA: Insufficient documentation

## 2019-11-11 DIAGNOSIS — Z7901 Long term (current) use of anticoagulants: Secondary | ICD-10-CM | POA: Insufficient documentation

## 2019-11-11 DIAGNOSIS — Z79890 Hormone replacement therapy (postmenopausal): Secondary | ICD-10-CM | POA: Insufficient documentation

## 2019-11-11 DIAGNOSIS — Z9889 Other specified postprocedural states: Secondary | ICD-10-CM

## 2019-11-11 DIAGNOSIS — Z87891 Personal history of nicotine dependence: Secondary | ICD-10-CM | POA: Insufficient documentation

## 2019-11-11 DIAGNOSIS — N189 Chronic kidney disease, unspecified: Secondary | ICD-10-CM | POA: Insufficient documentation

## 2019-11-11 DIAGNOSIS — J90 Pleural effusion, not elsewhere classified: Secondary | ICD-10-CM

## 2019-11-11 DIAGNOSIS — Z6830 Body mass index (BMI) 30.0-30.9, adult: Secondary | ICD-10-CM | POA: Insufficient documentation

## 2019-11-11 DIAGNOSIS — J9811 Atelectasis: Secondary | ICD-10-CM | POA: Diagnosis not present

## 2019-11-11 DIAGNOSIS — J948 Other specified pleural conditions: Secondary | ICD-10-CM | POA: Diagnosis not present

## 2019-11-11 DIAGNOSIS — I517 Cardiomegaly: Secondary | ICD-10-CM | POA: Diagnosis not present

## 2019-11-11 HISTORY — PX: THORACENTESIS: SHX235

## 2019-11-11 LAB — BODY FLUID CELL COUNT WITH DIFFERENTIAL
Eos, Fluid: 0 %
Lymphs, Fluid: 83 %
Monocyte-Macrophage-Serous Fluid: 12 % — ABNORMAL LOW (ref 50–90)
Neutrophil Count, Fluid: 5 % (ref 0–25)
Total Nucleated Cell Count, Fluid: 660 cu mm (ref 0–1000)

## 2019-11-11 LAB — LACTATE DEHYDROGENASE, PLEURAL OR PERITONEAL FLUID: LD, Fluid: 59 U/L — ABNORMAL HIGH (ref 3–23)

## 2019-11-11 LAB — PROTEIN, PLEURAL OR PERITONEAL FLUID: Total protein, fluid: 3.3 g/dL

## 2019-11-11 SURGERY — THORACENTESIS

## 2019-11-11 NOTE — Progress Notes (Signed)
Patient presented for thoracentesis on 8/13.  X-ray reviewed by Dr. Elsworth Soho prior to D/C.  Pt to start Eliquis tonight, per Dr. Elsworth Soho order.  Site clean, dry, and no bleeding upon D/C.

## 2019-11-11 NOTE — Procedures (Signed)
Thoracentesis  Procedure Note  KHADIJAH MASTRIANNI  [id number]  [birth year]  Date:11/11/19  Time:3:41 PM   Provider Performing:Adrean Findlay V. Sheela Mcculley   Procedure: Thoracentesis with imaging guidance (36067)  Indication(s) Pleural Effusion  Consent Risks of the procedure as well as the alternatives and risks of each were explained to the patient and/or caregiver.  Consent for the procedure was obtained and is signed in the bedside chart  Anesthesia Topical only with 1% lidocaine    Time Out Verified patient identification, verified procedure, site/side was marked, verified correct patient position, special equipment/implants available, medications/allergies/relevant history reviewed, required imaging and test results available.  Ultrasound images stored in media  Sterile Technique Maximal sterile technique including full sterile barrier drape, hand hygiene, sterile gown, sterile gloves, mask, hair covering, sterile ultrasound probe cover (if used).  Procedure Description Ultrasound was used to identify appropriate pleural anatomy for placement and overlying skin marked.  Area of drainage cleaned and draped in sterile fashion. Lidocaine was used to anesthetize the skin and subcutaneous tissue.  1500 cc's of amber yellow appearing fluid was drained from the right pleural space. Catheter then removed and bandaid applied to site.   Complications/Tolerance None; patient tolerated the procedure well. Chest X-ray is ordered to confirm no post-procedural complication.   EBL Minimal   Specimen(s) Pleural fluid   Kara Mead MD. FCCP. [facility]  If no response to pager , please call [phone]   11/11/2019

## 2019-11-11 NOTE — Interval H&P Note (Signed)
History and Physical Interval Note:  11/11/2019 3:38 PM  Kristin Hood  has presented today for surgery, with the diagnosis of pleural effusion.  The various methods of treatment have been discussed with the patient and family. After consideration of risks, benefits and other options for treatment, the patient has consented to  Procedure(s): THORACENTESIS (N/A) as a surgical intervention.  The patient's history has been reviewed, patient examined, no change in status, stable for surgery.  I have reviewed the patient's chart and labs.  Questions were answered to the patient's satisfaction.     Leanna Sato Elsworth Soho

## 2019-11-13 ENCOUNTER — Encounter (HOSPITAL_COMMUNITY): Payer: Self-pay | Admitting: Pulmonary Disease

## 2019-11-14 LAB — BODY FLUID CULTURE: Culture: NO GROWTH

## 2019-11-14 LAB — CYTOLOGY - NON PAP

## 2019-11-15 ENCOUNTER — Ambulatory Visit: Payer: PPO | Admitting: Internal Medicine

## 2019-11-15 ENCOUNTER — Encounter: Payer: Self-pay | Admitting: Internal Medicine

## 2019-11-15 ENCOUNTER — Other Ambulatory Visit: Payer: Self-pay

## 2019-11-15 VITALS — BP 130/70 | HR 77 | Ht 65.0 in | Wt 179.0 lb

## 2019-11-15 DIAGNOSIS — E890 Postprocedural hypothyroidism: Secondary | ICD-10-CM | POA: Diagnosis not present

## 2019-11-15 DIAGNOSIS — Z9889 Other specified postprocedural states: Secondary | ICD-10-CM

## 2019-11-15 MED ORDER — LEVOTHYROXINE SODIUM 175 MCG PO TABS: ORAL_TABLET | ORAL | 3 refills | Status: DC

## 2019-11-15 NOTE — Progress Notes (Signed)
Patient ID: Kristin Hood, female   DOB: 1940-07-05, 79 y.o.   MRN: [id number]  This visit occurred during the SARS-CoV-2 public health emergency.  Safety protocols were in place, including screening questions prior to the visit, additional usage of staff PPE, and extensive cleaning of exam room while observing appropriate contact time as indicated for disinfecting solutions.   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 Last visit 1 year ago.  She has SOB. She describes that 4 days ago,  she had thoracentesis: 1.5 L fluid removed.  Her breathing has improved.  Reviewed and addended history: She described having L-sided chest pressure (11/11/2013) >> went to the ED: found to be in A fib. Also had a Chest CT: Mediastinal and retroperitoneal lymph nodes, so a PET scan was recommended: a thyroid nodule appeared hypermetabolic. 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 She is seeing Dr. [name] with oncology.  PET scan (67/67/2094): hypermetabolic R thyroid nodule  Thyroid U/S (11/21/2013): R 2.7 x 1.8 cm nodule, heterogeneous, with coarse calcifications  R nodule Bx (12/01/2013): FLUS  11/30/2013 TSH: 6.84 (0.34-4.5).  R nodule Bx (07/11/2014): FLUS  07/31/2014: Called and d/w pt about the results of the Afirma test, which is "suspicious for neoplasm" (test results scanned under Media tab). This carries a risk of 75% for cancer. In this case, I suggested total thyroidectomy.   03/02/2015: Total thyroidectomy - Dr Harlow Asa >> benign pathology! (Surprisingly)  She is on levothyroxine 175 mcg daily, taken: - in am - fasting - at least 2-3h  from b'fast - no Fe, Ca, PPIs (she was on omeprazole before but stopped before last visit) - + MVI - at noon - not on Biotin  Reviewed her TFTs: Lab Results  Component Value Date   TSH  1.370 10/04/2019   TSH 1.827 10/28/2018   TSH 12.46 (H) 10/27/2017   TSH 3.00 10/27/2016   TSH 4.54 (H) 03/04/2016   TSH 6.46 (H) 11/19/2015   TSH 17.91 (H) 10/03/2015   TSH 25.94 (H) 08/22/2015   TSH 31.84 (H) 07/20/2015   TSH 36.21 (H) 06/06/2015   FREET4 1.43 10/27/2017   FREET4 1.59 10/27/2016   FREET4 1.66 (H) 03/04/2016   FREET4 1.55 11/19/2015   FREET4 1.26 10/03/2015   FREET4 1.00 08/22/2015   FREET4 1.04 07/20/2015   FREET4 1.00 06/06/2015   FREET4 1.13 12/11/2014  04/11/2015: TSH 40.5  Pt denies: - feeling nodules in neck - hoarseness - dysphagia - choking - SOB with lying down  Pt does have a FH of thyroid ds.: mother (hypothyroidism). No FH of thyroid cancer. No h/o radiation tx to head or neck.  No herbal supplements. No Biotin use. No recent steroids use.   She was admitted with Acute respiratory failure and CHF 05/2016 >> started Lasix >> lost 20 lbs. She sees [name]. She lost her husband after he fell and had a concussion in 06/2016.  She has a diagnosis of breast cancer diagnosed in 2000. She had chemotherapy for this.  She lives with her daughter and son in law who is a Systems developer. She has balanced meals daily. She lost 30 lbs since last OV by improving diet.  ROS: Constitutional: no weight gain/+ weight loss, no fatigue, no subjective hyperthermia, no subjective hypothermia Eyes: no blurry vision, no xerophthalmia ENT: no sore throat, + see HPI Cardiovascular: no  CP/+ SOB/no palpitations/no leg swelling Respiratory: no cough/+ SOB/no wheezing Gastrointestinal: no N/no V/no D/no C/no acid reflux Musculoskeletal: no muscle aches/no joint aches Skin: no rashes, no hair loss Neurological: no tremors/no numbness/no tingling/no dizziness  I reviewed pt's medications, allergies, PMH, social hx, family hx, and changes were documented in the history of present illness. Otherwise, unchanged from my initial visit note.  Past Medical History:  Diagnosis  Date  . Acute congestive heart failure ([place])   . Acute diastolic CHF (congestive heart failure) ([place]) 12/30/2016  . Acute respiratory failure with hypoxemia ([place]) 06/09/2016  . Acute respiratory failure with hypoxia ([place]) 12/30/2016  . Anginal pain ([place])   . ARF (acute renal failure) ([place]) 06/11/2016  . Arthritis   . Atrial fibrillation ([place]) 07/2012   Eliquis, rate controlled  . Benign hypertensive heart disease without heart failure 12/25/2012  . Breast cancer ([place]) 2001   left mastectomy  . Chest pain 12/11/2012  . CHF (congestive heart failure) ([place]) 06/10/2016  . Chronic atrial fibrillation ([place]) 11/16/2014  . Chronic diastolic heart failure ([place]) 02/13/2014  . Coarse tremors    , Essential  . Complication of anesthesia    SMALLER TUBE FOR INTUBATION AS GAGS ON TUBE  . COPD (chronic obstructive pulmonary disease) ([place])   . COPD exacerbation ([place]) 12/30/2016  . CVA (cerebral vascular accident) ([place]) 2013  . Dysrhythmia 08/20/2012   Atrial Fibrillation  . Essential and other specified forms of tremor 06/18/2012  . Essential hypertension 06/18/2012  . Family history of adverse reaction to anesthesia    mother experienced pain with intubation   . GERD (gastroesophageal reflux disease)    history of   . Hard of hearing   . Heart murmur   . History of chemotherapy 11/13/1998 to 2010   Adriamycin/Docotaxol, Dexorubicin/Taxotere, Femara, Tamoxifen  . History of CVA (cerebrovascular accident) 12/11/2012  . History of hiatal hernia    history of   . Hx of adenomatous colonic polyps 2006   , Benign  . Hypercholesteremia   . Hyperlipidemia 02/13/2014  . Hypertension    , With mild concentric left ventricular hypertrophy  . Long term current use of anticoagulant therapy 12/25/2012  . Mixed dyslipidemia   . Mixed hyperlipidemia 12/25/2012  . Morbid obesity ([place]) 06/18/2012  . Phlebitis and thrombophlebitis of superficial vessels of lower extremities 12/25/2012  . Postsurgical hypothyroidism  05/02/2015  . Pure hypercholesterolemia 12/25/2012  . Shingles   . Shortness of breath dyspnea    on exertion   . SOB (shortness of breath) 11/16/2014  . Spider veins   . Stroke ([place]) 11/17/2011   left side brain-speech-TPA  . Thyroid nodule   . Tremor, essential   . Unspecified cerebral artery occlusion with cerebral infarction 11/18/2011  . Valvular sclerosis 09/23/2003   without stenosis  . Varicose veins    Past Surgical History:  Procedure Laterality Date  . APPENDECTOMY  07/10/1957  . BIOPSY THYROID  12/01/2013   ultrasound  . BREAST SURGERY    . BUNIONECTOMY  11/29/1988   bilateral with hammer toes  . CARDIAC CATHETERIZATION  07/2010   , Without significant CAD  . CARDIAC CATHETERIZATION  07/2011   [name]  . CESAREAN SECTION    . CESAREAN SECTION  05/1968, 07/1965  . EYE SURGERY  01/31/2013   eye lid; cataract surgery bilat   . JOINT REPLACEMENT  05/2001   left knee  . JOINT REPLACEMENT  11/2001   right knee  . JOINT REPLACEMENT  01/2008  redo right knee  . KNEE SURGERY  2009   ,Total knee replacement  . LEFT HEART CATHETERIZATION WITH CORONARY ANGIOGRAM N/A 08/14/2011   Procedure: LEFT HEART CATHETERIZATION WITH CORONARY ANGIOGRAM;  Surgeon: [name], MD;  Location: [facility] CATH LAB;  Service: Cardiovascular;  Laterality: N/A;  . Left Rotator Cuff    . MASTECTOMY Left 10/19/1998   Radical, left -with lymph nodes (8 total)  . Reverse bunionectomy  08/29/1993   removal of Tibial Sesamoid-left  . TEE WITHOUT CARDIOVERSION  11/19/2011   Procedure: TRANSESOPHAGEAL ECHOCARDIOGRAM (TEE);  Surgeon: [name], MD;  Location: [facility] ENDOSCOPY;  Service: Cardiovascular;  Laterality: N/A;  . THORACENTESIS N/A 11/11/2019   Procedure: Mathews Robinsons;  Surgeon: Rigoberto Noel, MD;  Location: [facility];  Service: Cardiopulmonary;  Laterality: N/A;  . THYROIDECTOMY N/A 03/02/2015   Procedure: TOTAL THYROIDECTOMY;  Surgeon: Armandina Gemma, MD;  Location: [facility] ORS;  Service: General;   Laterality: N/A;  . TONSILLECTOMY    . TOTAL HIP ARTHROPLASTY Left 10/11/2014   Procedure: LEFT TOTAL HIP ARTHROPLASTY ANTERIOR APPROACH;  Surgeon: [name], MD;  Location: [facility] ORS;  Service: Orthopedics;  Laterality: Left;   History   Social History  . Marital Status: Married    Spouse Name: N/A    Number of Children: 2   Occupational History  . homemaker   Social History Main Topics  . Smoking status: Former Smoker -- 2.50 packs/day for 13 years    Types: Cigarettes    Quit date: 01/30/1992  . Smokeless tobacco: Never Used  . Alcohol Use:     3 drink(s) per week  . Drug Use: No   Current Outpatient Medications on File Prior to Visit  Medication Sig Dispense Refill  . albuterol (VENTOLIN HFA) 108 (90 Base) MCG/ACT inhaler Inhale 2 puffs into the lungs every 4 (four) hours as needed for wheezing or shortness of breath. 8 g 11  . amoxicillin-clavulanate (AUGMENTIN) 875-125 MG tablet Take 1 tablet by mouth 2 (two) times daily. (Patient not taking: Reported on 11/07/2019) 14 tablet 0  . atorvastatin (LIPITOR) 20 MG tablet Take 20 mg by mouth at bedtime.     . budesonide (PULMICORT) 0.25 MG/2ML nebulizer solution Take 0.25 mg by nebulization 2 (two) times daily.    . diphenhydrAMINE (BENADRYL) 25 MG tablet Take 25 mg by mouth daily as needed for allergies.    Marland Kitchen doxycycline (VIBRA-TABS) 100 MG tablet Take 1 tablet (100 mg total) by mouth 2 (two) times daily. (Patient not taking: Reported on 11/07/2019) 10 tablet 0  . ELIQUIS 5 MG TABS tablet TAKE 1 TABLET BY MOUTH TWICE DAILY (Patient taking differently: Take 5 mg by mouth 2 (two) times daily. ) 180 tablet 1  . fenofibrate 160 MG tablet Take 160 mg by mouth every evening.     . fluticasone (FLONASE) 50 MCG/ACT nasal spray Place 2 sprays into both nostrils daily as needed for allergies or rhinitis.     . furosemide (LASIX) 40 MG tablet Take 1 tablet (40 mg total) by mouth 2 (two) times daily.  0  . guaiFENesin (MUCINEX) 600 MG 12 hr  tablet Take 600 mg by mouth 2 (two) times daily as needed (congestion).     Marland Kitchen ketoconazole (NIZORAL) 2 % cream Apply 1 application topically daily as needed (vaginal fungus).     Marland Kitchen levothyroxine (SYNTHROID) 175 MCG tablet TAKE 1 TABLET BY MOUTH EVERY DAYBEFORE BREAKFAST (Patient taking differently: Take 175 mcg by mouth daily before breakfast. ) 90 tablet 0  .  Multiple Vitamin (MULTIVITAMIN WITH MINERALS) TABS tablet Take 1 tablet by mouth daily.     . potassium chloride (KLOR-CON) 10 MEQ tablet Take 1 tablet (10 mEq total) by mouth daily. (Patient taking differently: Take 10 mEq by mouth every evening. ) 90 tablet 3  . propranolol ER (INDERAL LA) 60 MG 24 hr capsule TAKE 2 CAPSULES IN THE MORNING & TAKE 1 CAPSULE IN THE AFTERNOON (Patient taking differently: Take 60-120 mg by mouth See admin instructions. Take 120 mg in the morning and 60 mg in the evening) 270 capsule 2  . Tiotropium Bromide-Olodaterol (STIOLTO RESPIMAT) 2.5-2.5 MCG/ACT AERS Inhale 2 puffs into the lungs daily. 8 g 0   No current facility-administered medications on file prior to visit.   Allergies  Allergen Reactions  . Floxin [Ofloxacin] Nausea Only and Other (See Comments)    Dizziness, Vertigo    Family History  Problem Relation Age of Onset  . Heart disease Mother   . Aortic stenosis Mother   . Heart disease Father   . Heart failure Father   . Heart attack Father    PE: BP 130/70   Pulse 77   Ht 5\' 5"  (1.651 m)   Wt 179 lb (81.2 kg)   SpO2 92%   BMI 29.79 kg/m  Body mass index is 29.79 kg/m. Wt Readings from Last 3 Encounters:  11/15/19 179 lb (81.2 kg)  11/11/19 179 lb (81.2 kg)  11/01/19 180 lb 9.6 oz (81.9 kg)   Constitutional: overweight, in NAD, walks with a walker Eyes: PERRLA, EOMI, no exophthalmos ENT: moist mucous membranes, no neck masses palpated, thyroidectomy scar healed, no cervical lymphadenopathy Cardiovascular: RRR, No MRG Respiratory: CTA B Gastrointestinal: abdomen soft, NT, ND,  BS+ Musculoskeletal: no deformities, strength intact in all 4 Skin: moist, warm, no rashes, + large ecchymosis on the right chin after recent fall Neurological: + tremor with outstretched hands, DTR 0/4 in bilateral knees (due to previous surgery)  ASSESSMENT: 1. Postoperative hypothyroidism - h/o R thyroid nodule, hypermetabolic on PET, FLUS x2 on Bx, Afirma + >> benign final pathology  2. S/p total thyroidectomy for right thyroid nodule  PLAN: 1. Postoperative hypothyroidism Patient has a history of a large thyroid nodule, which appeared to contain microcalcifications, also appeared to be hypermetabolic on PET scan, had 2 FLUS biopsies and a suspicious Afirma molecular marker test.  With all the above factors pointing to her thyroid cancer, I recommended thyroidectomy, which she had in 03/2015. There is surprisingly, the final pathology was benign. - latest thyroid labs reviewed with pt >> normal: Lab Results  Component Value Date   TSH 1.370 10/04/2019   - she continues on LT4 175 mcg daily - We did discuss that we may need to decrease the dose of levothyroxine if she loses more weight.  Since last visit, she lost 30 pounds by improving diet.  - pt feels good on this dose.  - we discussed about taking the thyroid hormone every day, with water, >30 minutes before breakfast, separated by >4 hours from acid reflux medications, calcium, iron, multivitamins. Pt. is taking it correctly. -We will not repeat her TFTs today but will do so at next visit - RTC in 1 year  2. S/p total thyroidectomy for R thyroid nodule -Patient denies neck compression symptoms -Her thyroidectomy scar healed well, without keloid, dysesthesia or erythema -At today's exam, I do not feel any neck masses or scar tissue  Philemon Kingdom, MD PhD [facility] Endocr[facility]ology

## 2019-11-15 NOTE — Patient Instructions (Signed)
Please continue Synthroid 175 mcg daily.  Take the thyroid hormone every day, with water, at least 30 minutes before breakfast, separated by at least 4 hours from: - acid reflux medications - calcium - iron - multivitamins  Please come back for a follow-up appointment in 1 year.

## 2019-11-16 LAB — CHOLESTEROL, BODY FLUID: Cholesterol, Fluid: 30 mg/dL

## 2019-11-18 ENCOUNTER — Telehealth: Payer: Self-pay | Admitting: Pulmonary Disease

## 2019-11-18 DIAGNOSIS — J449 Chronic obstructive pulmonary disease, unspecified: Secondary | ICD-10-CM

## 2019-11-18 NOTE — Telephone Encounter (Signed)
Looked to see if the fax was up front, I did not see it.   Will need to call [employer] on Monday as the office is currently closed.

## 2019-11-21 NOTE — Telephone Encounter (Signed)
11/21/2019  I have received a fax from [employer].  It was not clear to me what was needed.  I reached out to [facility] with [employer] for further clarification.  Insurance/ DME is requiring patient have a repeat overnight oximetry and repeat office visit.  I have placed the order for repeat overnight oximetry.  Please notify the patient.  Patient will need to have a follow-up with our office 1 to 2 weeks after completing the overnight oximetry.  Wyn Quaker, FNP

## 2019-11-21 NOTE — Telephone Encounter (Signed)
[facility] [facility] unable to reach left message to call office back

## 2019-11-21 NOTE — Telephone Encounter (Signed)
Called and spoke with pt letting her know the info stated by Aaron Edelman and she verbalized understanding. Pt does have a f/u scheduled with RB in September. We can schedule a sooner appt if needed.nothing further needed at this time.

## 2019-11-29 ENCOUNTER — Encounter: Payer: Self-pay | Admitting: Emergency Medicine

## 2019-11-29 ENCOUNTER — Other Ambulatory Visit: Payer: Self-pay

## 2019-11-29 ENCOUNTER — Ambulatory Visit: Payer: PPO | Admitting: Emergency Medicine

## 2019-11-29 ENCOUNTER — Ambulatory Visit (INDEPENDENT_AMBULATORY_CARE_PROVIDER_SITE_OTHER): Payer: PPO

## 2019-11-29 DIAGNOSIS — J910 Malignant pleural effusion: Secondary | ICD-10-CM | POA: Insufficient documentation

## 2019-11-29 DIAGNOSIS — C8590 Non-Hodgkin lymphoma, unspecified, unspecified site: Secondary | ICD-10-CM | POA: Diagnosis not present

## 2019-11-29 DIAGNOSIS — J9611 Chronic respiratory failure with hypoxia: Secondary | ICD-10-CM

## 2019-11-29 DIAGNOSIS — J811 Chronic pulmonary edema: Secondary | ICD-10-CM | POA: Diagnosis not present

## 2019-11-29 DIAGNOSIS — J9811 Atelectasis: Secondary | ICD-10-CM | POA: Diagnosis not present

## 2019-11-29 DIAGNOSIS — J449 Chronic obstructive pulmonary disease, unspecified: Secondary | ICD-10-CM | POA: Diagnosis not present

## 2019-11-29 DIAGNOSIS — I517 Cardiomegaly: Secondary | ICD-10-CM | POA: Diagnosis not present

## 2019-11-29 DIAGNOSIS — J90 Pleural effusion, not elsewhere classified: Secondary | ICD-10-CM | POA: Diagnosis not present

## 2019-11-29 NOTE — Assessment & Plan Note (Addendum)
  Using 2.5L/min qhs.  She wants to avoid a repeat overnight oximetry if possible as she sleeps very poorly and feels quite symptomatic when she sleeps overnight on room air.  I will perform a walking oximetry today to see if she desaturates during the daytime and needs oxygen both during the day and at night.

## 2019-11-29 NOTE — Progress Notes (Signed)
Subjective:    Patient ID: Kristin Hood, female    DOB: Feb 18, 1941, 79 y.o.   MRN: [id number]  Shortness of Breath Pertinent negatives include no ear pain, fever, headaches, leg swelling, rash, rhinorrhea, sore throat, vomiting or wheezing.   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  She also has hypertension with diastolic CHF followed by [name]. Currently managed on Stiolto, Pulmicort nebs.  She is using albuterol She is experiencing more UA noise and needs to clear her throat, seems to be weather-dependent. She is using mucinex, has loratadine and flonase available prn. She occasionally has reflux when she clears her throat strongly.   Most recent CT scan of the chest was done 08/19/2019 and I have reviewed, shows possible mild progression of her lymphoma with widespread adenopathy in the chest and retroperitoneum, moderate splenomegaly, moderate dependent right pleural effusion and some mosaic attenuation in both lungs. She is holding off on any further chemo - doesn't want it, is discussing with Dr [name]  ROV 11/29/19 --79 year old woman with COPD and bronchomalacia, chronic cough, hypertension with diastolic CHF, hypoxemic respiratory failure.  She also has stage IV low-grade nodular lymphoma with retroperitoneal lymphadenopathy (biopsy).  She 1has decided to forego any chemotherapy.  She underwent thoracentesis on 11/11/2019 for symptomatic pleural effusion, 1500 cc amber-yellow fluid removed from the right pleural space.  The cytology showed atypical lymphocytes.  She is due for a repeat overnight oximetry.  She is currently using 2.5 L/m at night.  Bronchodilator regimen includes Pulmicort nebs, Stiolto.  She has albuterol, uses about once a day. Flonase as needed, Mucinex as needed, Benadryl as needed  Covid  vaccine up-to-date      Review of Systems  Constitutional: Negative for fever and unexpected weight change.  HENT: Positive for congestion and postnasal drip. Negative for dental problem, ear pain, nosebleeds, rhinorrhea, sinus pressure, sneezing, sore throat and trouble swallowing.   Eyes: Negative for redness and itching.  Respiratory: Positive for shortness of breath. Negative for cough, chest tightness and wheezing.   Cardiovascular: Negative for palpitations and leg swelling.  Gastrointestinal: Negative for nausea and vomiting.  Genitourinary: Negative for dysuria.  Musculoskeletal: Negative for joint swelling.  Skin: Negative for rash.  Neurological: Negative for headaches.  Hematological: Does not bruise/bleed easily.  Psychiatric/Behavioral: Negative for dysphoric mood. The patient is not nervous/anxious.        Objective:   Physical Exam Vitals:   11/29/19 1402  BP: 118/64  Pulse: 78  Temp: (!) 97.3 F (36.3 C)  TempSrc: Temporal  SpO2: 94%  Weight: 166 lb 3.2 oz (75.4 kg)  Height: 5\' 4"  (1.626 m)   Gen: Pleasant, overwt, in no distress,  normal affect  ENT: No lesions,  mouth clear,  oropharynx clear, no postnasal drip  Neck: No JVD, no stridor  Lungs: No use of accessory muscles, decreased breath sounds at both bases, more so on the right than on the left intact breath sounds in the mid lung  Cardiovascular: RRR, early syst M 3/6, intact S2.  2+ pretibial pitting edema  Musculoskeletal: No deformities, no cyanosis or clubbing  Neuro: alert, non focal, some tremor and tremulous speech.   Skin: Warm, no lesions or rash      Assessment & Plan:  Malignant pleural effusion With thoracentesis on 8/13.  I will recheck a chest x-ray today to assess  rate of reaccumulation.  She may benefit going forward from an evaluation for palliative Pleurx placement.  We can consider this depending on how fast the fluid returns.  COPD mixed type ([place]) Plan to continue  Stiolto, Pulmicort nebs. Continue albuterol as needed  Chronic respiratory failure with hypoxia ([facility])  Using 2.5L/min qhs.  She wants to avoid a repeat overnight oximetry if possible as she sleeps very poorly and feels quite symptomatic when she sleeps overnight on room air.  I will perform a walking oximetry today to see if she desaturates during the daytime and needs oxygen both during the day and at night.  Baltazar Apo, MD, PhD 11/29/2019, 2:16 PM [facility] [phone] or if no answer [phone]

## 2019-11-29 NOTE — Patient Instructions (Signed)
Chest x-ray today to evaluate your pleural effusion Please continue Stiolto 2 puffs once daily. Please continue Pulmicort nebulizer twice a day you have been taking it.  Rinse and gargle after you use this. Keep your albuterol available to use up to every 4 hours if needed for shortness of breath, chest tightness, wheezing. Walking oximetry today on room air We will confirm your overnight oximetry information and communicate with [employer] to work on obtaining a portable concentrator for nighttime use. Follow with Dr [name] in 3 months or sooner if you have any problems.

## 2019-11-29 NOTE — Assessment & Plan Note (Signed)
With thoracentesis on 8/13.  I will recheck a chest x-ray today to assess rate of reaccumulation.  She may benefit going forward from an evaluation for palliative Pleurx placement.  We can consider this depending on how fast the fluid returns.

## 2019-11-29 NOTE — Assessment & Plan Note (Signed)
Plan to continue Stiolto, Pulmicort nebs. Continue albuterol as needed

## 2019-12-02 ENCOUNTER — Telehealth: Payer: Self-pay | Admitting: Emergency Medicine

## 2019-12-02 DIAGNOSIS — J449 Chronic obstructive pulmonary disease, unspecified: Secondary | ICD-10-CM

## 2019-12-02 DIAGNOSIS — J910 Malignant pleural effusion: Secondary | ICD-10-CM

## 2019-12-02 NOTE — Telephone Encounter (Signed)
Spoke with pt. She is requesting recent cxr results from 11/29/19. Pt states that SOB is about the same but she is having some pain in right side of back around the bra line area. Tylenol does helps some. Please advise.

## 2019-12-02 NOTE — Telephone Encounter (Signed)
RB please advise, thanks. 

## 2019-12-02 NOTE — Telephone Encounter (Signed)
CXR showed moderate right pleural effusion. Will send to Dr. [name], he will make decision whether or not we repeat thoracentesis or discuss palliative pleurx. Please note we do not have home health that can manage pleurx drains at home so she or a family member would need to be taught how to drain if that what is elected.

## 2019-12-06 NOTE — Telephone Encounter (Signed)
Called and spoke with patient about CXR results from Dr. [name]. Patient was asking about portable o2 there was an order sent in on 8/31. Community message sent to [employer] will wait to get response back.

## 2019-12-06 NOTE — Telephone Encounter (Signed)
Message received back from [employer] to place order for Best fit eval for portable oxygen. Order has been placed. Nothing further needed at this time.    Malena Peer, CMA; Darlina Guys; Earline Mayotte Izetta Dakin, [facility],   We could do an O2 best fit for the patient. Please create an order for that so I can pull it and then start the process.   In the future please include the entire team just in case one is on vacation (which I just got back from this afternoon).   Thank you so much and I hope that helps!

## 2019-12-06 NOTE — Telephone Encounter (Signed)
I reviewed her CXR last week - It looks like there has not been a significant amount of reaccumulation of her pleural effusion. We will need to follow the pace of fluid buildup, but I don't believe she needs a repeat thoracentesis or pleurx based on that CXR. If / when her SOB changes then we will need to repeat the CXR to see how much fluid has returned. I suspect ultimately she will benefit from being drained again.

## 2019-12-15 ENCOUNTER — Ambulatory Visit: Payer: PPO | Admitting: Emergency Medicine

## 2019-12-19 ENCOUNTER — Telehealth: Payer: Self-pay | Admitting: Emergency Medicine

## 2019-12-19 DIAGNOSIS — I5032 Chronic diastolic (congestive) heart failure: Secondary | ICD-10-CM | POA: Diagnosis not present

## 2019-12-19 DIAGNOSIS — N183 Chronic kidney disease, stage 3 unspecified: Secondary | ICD-10-CM | POA: Diagnosis not present

## 2019-12-19 DIAGNOSIS — I10 Essential (primary) hypertension: Secondary | ICD-10-CM | POA: Diagnosis not present

## 2019-12-19 DIAGNOSIS — I4891 Unspecified atrial fibrillation: Secondary | ICD-10-CM | POA: Diagnosis not present

## 2019-12-19 DIAGNOSIS — J441 Chronic obstructive pulmonary disease with (acute) exacerbation: Secondary | ICD-10-CM | POA: Diagnosis not present

## 2019-12-19 DIAGNOSIS — Z853 Personal history of malignant neoplasm of breast: Secondary | ICD-10-CM | POA: Diagnosis not present

## 2019-12-19 DIAGNOSIS — E782 Mixed hyperlipidemia: Secondary | ICD-10-CM | POA: Diagnosis not present

## 2019-12-19 DIAGNOSIS — K219 Gastro-esophageal reflux disease without esophagitis: Secondary | ICD-10-CM | POA: Diagnosis not present

## 2019-12-19 DIAGNOSIS — I272 Pulmonary hypertension, unspecified: Secondary | ICD-10-CM | POA: Diagnosis not present

## 2019-12-19 DIAGNOSIS — E890 Postprocedural hypothyroidism: Secondary | ICD-10-CM | POA: Diagnosis not present

## 2019-12-19 DIAGNOSIS — J449 Chronic obstructive pulmonary disease, unspecified: Secondary | ICD-10-CM | POA: Diagnosis not present

## 2019-12-19 NOTE — Telephone Encounter (Signed)
Spoke with the pt  She states wanted to let Dr [name] know that she had a couple of bad days with her breathing last wk  She had to use her o2 during the day to keep her sats above 90%  She was running 81-82% on ra and increased to 92@ 2.5 lpm  She states has test with [employer] this wk to see if she will qualify for POC  Will forward to [place] to make him aware

## 2019-12-19 NOTE — Telephone Encounter (Signed)
Spoke with patient regarding prior message. Advised patient per Dr.Byrum patient needs to wear her O2 during the day and with any exertion.Patient wanted to know what she can use to put in her nose because it gets very dry from the O2 and sometime her nose will bleed.   Dr.Byrum can you please advise.  Thank you

## 2019-12-19 NOTE — Telephone Encounter (Signed)
She is supposed to be wearing her O2 during the day with any exertion.

## 2019-12-20 NOTE — Telephone Encounter (Signed)
Recommend nasal saline spray or saline gel (not petroleum jelly)

## 2019-12-20 NOTE — Telephone Encounter (Signed)
Called and spoke with pt letting her know the info stated by RB and she verbalized understanding. Nothing further needed. 

## 2019-12-23 ENCOUNTER — Other Ambulatory Visit: Payer: Self-pay

## 2019-12-23 MED ORDER — FUROSEMIDE 40 MG PO TABS: 40.0000 mg | ORAL_TABLET | Freq: Two times a day (BID) | ORAL | 3 refills | Status: DC

## 2019-12-23 NOTE — Telephone Encounter (Signed)
Pt's medication was sent to pt's pharmacy as requested. Confirmation received.  °

## 2020-01-16 ENCOUNTER — Telehealth: Payer: Self-pay | Admitting: Emergency Medicine

## 2020-01-16 ENCOUNTER — Telehealth: Payer: Self-pay | Admitting: Cardiology

## 2020-01-16 NOTE — Telephone Encounter (Signed)
I don't think she needs a CXR or OV here at this point. I agree that she should talk to cardiology first - they may need to adjust diuretics, check labs, etc.

## 2020-01-16 NOTE — Telephone Encounter (Signed)
Primary Pulmonologist: Dr [name] Last office visit and with whom: 11/29/19-Dr Byrum What do we see them for (pulmonary problems): malignant pleural effusion,dyspnea Last OV assessment/plan:  Instructions  Chest x-ray today to evaluate your pleural effusion Please continue Stiolto 2 puffs once daily. Please continue Pulmicort nebulizer twice a day you have been taking it.  Rinse and gargle after you use this. Keep your albuterol available to use up to every 4 hours if needed for shortness of breath, chest tightness, wheezing. Walking oximetry today on room air We will confirm your overnight oximetry information and communicate with [employer] to work on obtaining a portable concentrator for nighttime use. Follow with Dr [name] in 3 months or sooner if you have any problems.         Reason for call:  Patient stated she has had swollen legs for 2 weeks. Patient stated she has been trying to manage herself at home.  Patient stated she has been doing daily weights, and has been fighting to keep from gaining 3lbs daily. Patient stated she was seen last week by [facility]. [facility] Nurse checked her legs, and told her there was no cellulitis, no warmth, and only had some leakage around a scratch.  Patient stated a bandage was applied. Patient stated she was advised to increase Lasix last Tuesday and Wednesday to 3 tabs a day, and someone was suppose to call back Thursday to check on her, but no one did. Patient stated she has noticed some leakage in both lower extremities, and her daughter has wrapped both lower extremities with a ace wrap. Patient stated she has elevated her legs. Patient denies any sob, congestion, or cough.  Patient stated she is using her nebs BID, as instructed, and O2 at 2.5 liters [place]. Patient stated she is having chest tightness when laying back in her recliner. Patient has not contacted her PCP or cardiology, because she wanted Dr. Agustina Caroli recommendations first. Patient  stated she would come into office for a cxr or OV if Dr [name] thought that was needed.   Message routed to Dr [name] tyo advise  Allergies  Allergen Reactions  . Floxin [Ofloxacin] Nausea Only and Other (See Comments)    Dizziness, Vertigo     Immunization History  Administered Date(s) Administered  . Influenza Split 11/29/2014  . Influenza, High Dose Seasonal PF 12/09/2016  . Influenza,inj,Quad PF,6+ Mos 11/16/2015  . Influenza-Unspecified 11/30/2013, 01/15/2018  . PFIZER SARS-COV-2 Vaccination 04/22/2019, 05/13/2019  . Pneumococcal Conjugate-13 06/07/2014  . Pneumococcal-Unspecified 02/26/2011  . Zoster 07/01/2010

## 2020-01-16 NOTE — Telephone Encounter (Signed)
Spoke with pt who complains of bilateral leg edema from ankles to shins x 2 weeks with weight increase of 3-4 pounds.  Pt reports she has had weeping of her legs without redness and were dressed by [facility].  Pt denies new or increased SOB or CP.  Pt states she called [facility] and they evaluated her on 01/09/2020.  Pt states her Lasix was increased by 1 tablet on Tuesday and Wednesday and she has dropped 2 pounds.  Pt reports her legs continue to weep and her daughter is changing the dressing.  [facility] was supposed to follow up with pt last Thursday but according to pt did not.  Pt advised Dr [name] is out of the office today and encouraged pt to go ahead and contact [facility] since they did not follow up as planned.  Will forward information to Dr [name] and RN for review and recommendation.  Appointment scheduled with Dr [name] for 01/19/2020.  Pt verbalized understanding and agrees with current plan.

## 2020-01-16 NOTE — Telephone Encounter (Signed)
Patient is aware of results and voiced her understanding.  Nothing further needed.   

## 2020-01-16 NOTE — Telephone Encounter (Signed)
Pt c/o swelling: STAT is pt has developed SOB within 24 hours  1) How much weight have you gained and in what time span? 3-4 lbs in about 2 weeks  2) If swelling, where is the swelling located? Both legs  3) Are you currently taking a fluid pill? yes  4) Are you currently SOB? No   5) Do you have a log of your daily weights (if so, list)? 179 lbs prior to swelling, 183 lbs after    6) Have you gained 3 pounds in a day or 5 pounds in a week? No    7) Have you traveled recently? No

## 2020-01-17 NOTE — Telephone Encounter (Signed)
Spoke with pt's daughter, [name],DPR who states pt is still sleeping.  Daughter advised per Dr [name] increase Lasix to 80mg  po bid and recheck BMET labs next visit planned for 01/19/2020.  Pt's daughter verbalizes understanding and agrees with current plan.

## 2020-01-17 NOTE — Progress Notes (Signed)
Patient had f/u with Dr. [name] on 11/29/19.  Nothing further needed.

## 2020-01-17 NOTE — Telephone Encounter (Signed)
Increase lasix to 80 po BID Check BMET at next visit [name], MD

## 2020-01-19 ENCOUNTER — Ambulatory Visit: Payer: PPO | Admitting: Cardiology

## 2020-01-19 ENCOUNTER — Encounter: Payer: Self-pay | Admitting: Cardiology

## 2020-01-19 ENCOUNTER — Other Ambulatory Visit: Payer: Self-pay

## 2020-01-19 VITALS — BP 106/52 | HR 73 | Ht 64.0 in | Wt 178.0 lb

## 2020-01-19 DIAGNOSIS — I10 Essential (primary) hypertension: Secondary | ICD-10-CM

## 2020-01-19 DIAGNOSIS — Z79899 Other long term (current) drug therapy: Secondary | ICD-10-CM

## 2020-01-19 NOTE — Progress Notes (Signed)
Cardiology Office Note:    Date:  01/19/2020   ID:  [name], DOB 26-Jun-1940, MRN [id number]  PCP:  Kristin Fess, MD  [facility] Cardiologist:  [name], MD  [facility] Electrophysiologist:  None   Referring MD: Kristin Fess, MD     History of Present Illness:    Kristin Hood is a 79 y.o. female comes in today for evaluation of extreme leg edema, leg weeping.  [name], her daughter had noticed increasing edema.  On October 19, I advised them to increase the Lasix to 80 mg twice a day.  This lower extremity edema has been going for the past 2 weeks.  Weeping in her legs without any redness.  They have had home health agency dress her legs.  No new shortness of breath or chest pain.  Now 178 lbs.  She recently saw pulmonary Dr. Lamonte Hood on 11/29/2019.  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  She has had thoracentesis for reaccumulation of right pleural effusion.  Past Medical History:  Diagnosis Date  . Acute congestive heart failure ([place])   . Acute diastolic CHF (congestive heart failure) ([place]) 12/30/2016  . Acute respiratory failure with hypoxemia ([place]) 06/09/2016  . Acute respiratory failure with hypoxia ([place]) 12/30/2016  . Anginal pain ([place])   . ARF (acute renal failure) ([place]) 06/11/2016  . Arthritis   . Atrial fibrillation ([place]) 07/2012   Eliquis, rate controlled  . Benign hypertensive heart disease without heart failure 12/25/2012  . Breast cancer ([place]) 2001   left mastectomy  . Chest pain 12/11/2012  . CHF (congestive heart failure) ([place]) 06/10/2016  . Chronic atrial fibrillation ([place]) 11/16/2014  . Chronic diastolic heart failure ([place]) 02/13/2014  . Coarse tremors    , Essential  . Complication of anesthesia    SMALLER TUBE FOR INTUBATION AS GAGS ON TUBE  . COPD (chronic obstructive pulmonary disease) ([place])   . COPD exacerbation ([place]) 12/30/2016  . CVA (cerebral vascular accident) ([place]) 2013  . Dysrhythmia  08/20/2012   Atrial Fibrillation  . Essential and other specified forms of tremor 06/18/2012  . Essential hypertension 06/18/2012  . Family history of adverse reaction to anesthesia    mother experienced pain with intubation   . GERD (gastroesophageal reflux disease)    history of   . Hard of hearing   . Heart murmur   . History of chemotherapy 11/13/1998 to 2010   Adriamycin/Docotaxol, Dexorubicin/Taxotere, Femara, Tamoxifen  . History of CVA (cerebrovascular accident) 12/11/2012  . History of hiatal hernia    history of   . Hx of adenomatous colonic polyps 2006   , Benign  . Hypercholesteremia   . Hyperlipidemia 02/13/2014  . Hypertension    , With mild concentric left ventricular hypertrophy  . Long term current use of anticoagulant therapy 12/25/2012  . Mixed dyslipidemia   . Mixed hyperlipidemia 12/25/2012  . Morbid obesity ([place]) 06/18/2012  . Phlebitis and thrombophlebitis of superficial vessels of lower extremities 12/25/2012  . 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 Pure hypercholesterolemia 12/25/2012  . Shingles   . Shortness of breath dyspnea    on exertion   . SOB (shortness of breath) 11/16/2014  . Spider veins   . Stroke ([place]) 11/17/2011   left side brain-speech-TPA  . Thyroid nodule   . Tremor, essential   . Unspecified cerebral artery occlusion with cerebral infarction 11/18/2011  . Valvular sclerosis 09/23/2003   without stenosis  . Varicose veins  Past Surgical History:  Procedure Laterality Date  . APPENDECTOMY  07/10/1957  . BIOPSY THYROID  12/01/2013   ultrasound  . BREAST SURGERY    . BUNIONECTOMY  11/29/1988   bilateral with hammer toes  . CARDIAC CATHETERIZATION  07/2010   , Without significant CAD  . CARDIAC CATHETERIZATION  07/2011   [name]  . CESAREAN SECTION    . CESAREAN SECTION  05/1968, 07/1965  . EYE SURGERY  01/31/2013   eye lid; cataract surgery bilat   . JOINT REPLACEMENT  05/2001   left knee  . JOINT REPLACEMENT  11/2001     right knee  . JOINT REPLACEMENT  01/2008   redo right knee  . KNEE SURGERY  2009   ,Total knee replacement  . LEFT HEART CATHETERIZATION WITH CORONARY ANGIOGRAM N/A 08/14/2011   Procedure: LEFT HEART CATHETERIZATION WITH CORONARY ANGIOGRAM;  Surgeon: [name], MD;  Location: [facility] CATH LAB;  Service: Cardiovascular;  Laterality: N/A;  . Left Rotator Cuff    . MASTECTOMY Left 10/19/1998   Radical, left -with lymph nodes (8 total)  . Reverse bunionectomy  08/29/1993   removal of Tibial Sesamoid-left  . TEE WITHOUT CARDIOVERSION  11/19/2011   Procedure: TRANSESOPHAGEAL ECHOCARDIOGRAM (TEE);  Surgeon: [name], MD;  Location: [facility] ENDOSCOPY;  Service: Cardiovascular;  Laterality: N/A;  . THORACENTESIS N/A 11/11/2019   Procedure: Mathews Robinsons;  Surgeon: Rigoberto Noel, MD;  Location: [place];  Service: Cardiopulmonary;  Laterality: N/A;  . THYROIDECTOMY N/A 03/02/2015   Procedure: TOTAL THYROIDECTOMY;  Surgeon: Armandina Gemma, MD;  Location: [facility] ORS;  Service: General;  Laterality: N/A;  . TONSILLECTOMY    . TOTAL HIP ARTHROPLASTY Left 10/11/2014   Procedure: LEFT TOTAL HIP ARTHROPLASTY ANTERIOR APPROACH;  Surgeon: [name], MD;  Location: [facility] ORS;  Service: Orthopedics;  Laterality: Left;    Current Medications: Current Meds  Medication Sig  . albuterol (VENTOLIN HFA) 108 (90 Base) MCG/ACT inhaler Inhale 2 puffs into the lungs every 4 (four) hours as needed for wheezing or shortness of breath.  Marland Kitchen amoxicillin-clavulanate (AUGMENTIN) 875-125 MG tablet Take 1 tablet by mouth 2 (two) times daily.  Marland Kitchen atorvastatin (LIPITOR) 20 MG tablet Take 20 mg by mouth at bedtime.   . budesonide (PULMICORT) 0.25 MG/2ML nebulizer solution Take 0.25 mg by nebulization 2 (two) times daily.  . diphenhydrAMINE (BENADRYL) 25 MG tablet Take 25 mg by mouth daily as needed for allergies.  Marland Kitchen doxycycline (VIBRA-TABS) 100 MG tablet Take 1 tablet (100 mg total) by mouth 2 (two) times daily.  Marland Kitchen ELIQUIS 5 MG TABS  tablet TAKE 1 TABLET BY MOUTH TWICE DAILY  . fenofibrate 160 MG tablet Take 160 mg by mouth every evening.   . fluticasone (FLONASE) 50 MCG/ACT nasal spray Place 2 sprays into both nostrils daily as needed for allergies or rhinitis.   . furosemide (LASIX) 40 MG tablet Take 1 tablet (40 mg total) by mouth 2 (two) times daily.  Marland Kitchen guaiFENesin (MUCINEX) 600 MG 12 hr tablet Take 600 mg by mouth 2 (two) times daily as needed (congestion).   Marland Kitchen ketoconazole (NIZORAL) 2 % cream Apply 1 application topically daily as needed (vaginal fungus).   Marland Kitchen levothyroxine (SYNTHROID) 175 MCG tablet TAKE 1 TABLET BY MOUTH EVERY DAYBEFORE BREAKFAST  . Multiple Vitamin (MULTIVITAMIN WITH MINERALS) TABS tablet Take 1 tablet by mouth daily.   . potassium chloride (KLOR-CON) 10 MEQ tablet Take 1 tablet (10 mEq total) by mouth daily.  . propranolol ER (INDERAL LA) 60 MG  24 hr capsule TAKE 2 CAPSULES IN THE MORNING & TAKE 1 CAPSULE IN THE AFTERNOON  . Tiotropium Bromide-Olodaterol (STIOLTO RESPIMAT) 2.5-2.5 MCG/ACT AERS Inhale 2 puffs into the lungs daily.     Allergies:   Floxin [ofloxacin]   Social History   Socioeconomic History  . Marital status: Widowed    Spouse name: Not on file  . Number of children: Not on file  . Years of education: Not on file  . Highest education level: Not on file  Occupational History  . Occupation: retired  Tobacco Use  . Smoking status: Former Smoker    Packs/day: 2.00    Years: 30.00    Pack years: 60.00    Types: Cigarettes    Quit date: 01/30/1992    Years since quitting: 27.9  . Smokeless tobacco: Never Used  Vaping Use  . Vaping Use: Never used  Substance and Sexual Activity  . Alcohol use: Yes    Alcohol/week: 1.0 standard drink    Types: 1 Standard drinks or equivalent per week    Comment: wine occassionally  . Drug use: No  . Sexual activity: Not on file  Other Topics Concern  . Not on file  Social History Narrative  . Not on file   Social Determinants of  Health   Financial Resource Strain:   . Difficulty of Paying Living Expenses: Not on file  Food Insecurity:   . Worried About Charity fundraiser in the Last Year: Not on file  . Ran Out of Food in the Last Year: Not on file  Transportation Needs:   . Lack of Transportation (Medical): Not on file  . Lack of Transportation (Non-Medical): Not on file  Physical Activity:   . Days of Exercise per Week: Not on file  . Minutes of Exercise per Session: Not on file  Stress:   . Feeling of Stress : Not on file  Social Connections:   . Frequency of Communication with Friends and Family: Not on file  . Frequency of Social Gatherings with Friends and Family: Not on file  . Attends Religious Services: Not on file  . Active Member of Clubs or Organizations: Not on file  . Attends Archivist Meetings: Not on file  . Marital Status: Not on file     Family History: The patient's family history includes Aortic stenosis in her mother; Heart attack in her father; Heart disease in her father and mother; Heart failure in her father.  ROS:   Please see the history of present illness.    She is battling some orthopnea.  No chest pain except for occasional upper chest discomfort that is fleeting.  All other systems reviewed and are negative.  EKGs/Labs/Other Studies Reviewed:   Recent Labs: 30-Oct-2019: Hemoglobin 10.7; Platelets 270; TSH 1.370 10/25/2019: ALT 10; BUN 35; Creatinine, Ser 0.98; NT-Pro BNP 4,134; Potassium 4.1; Sodium 136  Recent Lipid Panel    Component Value Date/Time   CHOL 148 11/18/2011 0420   TRIG 101 11/18/2011 0420   HDL 34 (L) 11/18/2011 0420   CHOLHDL 4.4 11/18/2011 0420   VLDL 20 11/18/2011 0420   LDLCALC 94 11/18/2011 0420      Physical Exam:    VS:  BP (!) 106/52   Pulse 73   Ht 5\' 4"  (1.626 m)   Wt 178 lb (80.7 kg)   SpO2 97%   BMI 30.55 kg/m     Wt Readings from Last 3 Encounters:  01/19/20 178 lb (80.7  kg)  11/29/19 166 lb 3.2 oz (75.4 kg)    11/15/19 179 lb (81.2 kg)     GEN: Well nourished, well developed in no acute distress HEENT: Normal, on home oxygen. NECK: No JVD; No carotid bruits LYMPHATICS: No lymphadenopathy CARDIAC: irreg, no murmurs, rubs, gallops RESPIRATORY: Decreased sounds at bases bilaterally. ABDOMEN: Soft, non-tender, non-distended MUSCULOSKELETAL:  No edema; No deformity  SKIN: Warm and dry, there are some excoriations on her shins bilaterally.  Mild redness surrounding.  No signs of cellulitis. NEUROLOGIC:  Alert and oriented x 3 PSYCHIATRIC:  Normal affect   ASSESSMENT:    1. Essential hypertension   2. Medication management    PLAN:    In order of problems listed above:  Significant lower extremity edema -Both dependent edema as well as edema as it relates to diastolic heart failure.  She also has significant pleural effusion. -Continue with Lasix 80 mg twice a day.  She is only taking 10 mEq of potassium currently.  We may need to increase this based upon our basic metabolic profile that we are drawing today. -We will see her back in 2 weeks for close follow-up. -Her weight is down to 178.  My hunch is that even at 168 she may still have some fluid left.  Her legs are still quite edematous.  Atrial fibrillation persistent -Controlled.  On propranolol.  Eliquis for anticoagulation.  Short follow-up.  If symptoms were to worsen or progress, she may require further IV diuresis which may require hospitalization.  Hopefully we will not need this.  Appreciate Dr. Agustina Caroli assistance with pleural effusions which are recurrent.  Hopefully this increase in Lasix will help as well.  Medication Adjustments/Labs and Tests Ordered: Current medicines are reviewed at length with the patient today.  Concerns regarding medicines are outlined above.  Orders Placed This Encounter  Procedures  . Basic metabolic panel   No orders of the defined types were placed in this encounter.   Patient Instructions   Medication Instructions:  The current medical regimen is effective;  continue present plan and medications.  *If you need a refill on your cardiac medications before your next appointment, please call your pharmacy*  Lab Work: Please have blood work today (BMP)  If you have labs (blood work) drawn today and your tests are completely normal, you will receive your results only by: Marland Kitchen MyChart Message (if you have MyChart) OR . A paper copy in the mail If you have any lab test that is abnormal or we need to change your treatment, we will call you to review the results.  Follow-Up: At [facility], you and your health needs are our priority.  As part of our continuing mission to provide you with exceptional heart care, we have created designated Provider Care Teams.  These Care Teams include your primary Cardiologist (physician) and Advanced Practice Providers (APPs -  Physician Assistants and Nurse Practitioners) who all work together to provide you with the care you need, when you need it.  We recommend signing up for the patient portal called "MyChart".  Sign up information is provided on this After Visit Summary.  MyChart is used to connect with patients for Virtual Visits (Telemedicine).  Patients are able to view lab/test results, encounter notes, upcoming appointments, etc.  Non-urgent messages can be sent to your provider as well.   To learn more about what you can do with MyChart, go to [email or web address].    Your next appointment:   2 week(s)  The format for your next appointment:   In Person  Provider:   [name], MD   Thank you for choosing [facility]!!         Signed, [name], MD  01/19/2020 3:58 PM    [place]

## 2020-01-19 NOTE — Patient Instructions (Signed)
Medication Instructions:  The current medical regimen is effective;  continue present plan and medications.  *If you need a refill on your cardiac medications before your next appointment, please call your pharmacy*  Lab Work: Please have blood work today (BMP)  If you have labs (blood work) drawn today and your tests are completely normal, you will receive your results only by: Marland Kitchen MyChart Message (if you have MyChart) OR . A paper copy in the mail If you have any lab test that is abnormal or we need to change your treatment, we will call you to review the results.  Follow-Up: At [facility], you and your health needs are our priority.  As part of our continuing mission to provide you with exceptional heart care, we have created designated Provider Care Teams.  These Care Teams include your primary Cardiologist (physician) and Advanced Practice Providers (APPs -  Physician Assistants and Nurse Practitioners) who all work together to provide you with the care you need, when you need it.  We recommend signing up for the patient portal called "MyChart".  Sign up information is provided on this After Visit Summary.  MyChart is used to connect with patients for Virtual Visits (Telemedicine).  Patients are able to view lab/test results, encounter notes, upcoming appointments, etc.  Non-urgent messages can be sent to your provider as well.   To learn more about what you can do with MyChart, go to [email or web address].    Your next appointment:   2 week(s)  The format for your next appointment:   In Person  Provider:   [name], MD   Thank you for choosing [facility]!!

## 2020-01-20 ENCOUNTER — Telehealth: Payer: Self-pay

## 2020-01-20 LAB — BASIC METABOLIC PANEL
BUN/Creatinine Ratio: 38 — ABNORMAL HIGH (ref 12–28)
BUN: 39 mg/dL — ABNORMAL HIGH (ref 8–27)
CO2: 35 mmol/L — ABNORMAL HIGH (ref 20–29)
Calcium: 9.7 mg/dL (ref 8.7–10.3)
Chloride: 93 mmol/L — ABNORMAL LOW (ref 96–106)
Creatinine, Ser: 1.03 mg/dL — ABNORMAL HIGH (ref 0.57–1.00)
GFR calc Af Amer: 59 mL/min/{1.73_m2} — ABNORMAL LOW (ref >59)
GFR calc non Af Amer: 51 mL/min/{1.73_m2} — ABNORMAL LOW (ref >59)
Glucose: 82 mg/dL (ref 65–99)
Potassium: 4.3 mmol/L (ref 3.5–5.2)
Sodium: 140 mmol/L (ref 134–144)

## 2020-01-20 NOTE — Telephone Encounter (Signed)
Pt given her lab results, per MD. She verbalized understanding and has no questions/concerns at this time.

## 2020-01-23 ENCOUNTER — Encounter (HOSPITAL_COMMUNITY): Payer: Self-pay | Admitting: Emergency Medicine

## 2020-01-23 ENCOUNTER — Emergency Department (HOSPITAL_COMMUNITY): Payer: PPO

## 2020-01-23 ENCOUNTER — Emergency Department (HOSPITAL_COMMUNITY)
Admission: EM | Admit: 2020-01-23 | Discharge: 2020-01-23 | Disposition: A | Discharge status: Home / Self Care | Payer: PPO | Attending: Emergency Medicine | Admitting: Emergency Medicine

## 2020-01-23 DIAGNOSIS — J441 Chronic obstructive pulmonary disease with (acute) exacerbation: Secondary | ICD-10-CM | POA: Diagnosis not present

## 2020-01-23 DIAGNOSIS — R58 Hemorrhage, not elsewhere classified: Secondary | ICD-10-CM | POA: Diagnosis not present

## 2020-01-23 DIAGNOSIS — Z79899 Other long term (current) drug therapy: Secondary | ICD-10-CM | POA: Insufficient documentation

## 2020-01-23 DIAGNOSIS — N183 Chronic kidney disease, stage 3 unspecified: Secondary | ICD-10-CM | POA: Insufficient documentation

## 2020-01-23 DIAGNOSIS — Z96653 Presence of artificial knee joint, bilateral: Secondary | ICD-10-CM | POA: Insufficient documentation

## 2020-01-23 DIAGNOSIS — Z87891 Personal history of nicotine dependence: Secondary | ICD-10-CM | POA: Diagnosis not present

## 2020-01-23 DIAGNOSIS — S0003XA Contusion of scalp, initial encounter: Secondary | ICD-10-CM | POA: Diagnosis not present

## 2020-01-23 DIAGNOSIS — S0990XA Unspecified injury of head, initial encounter: Secondary | ICD-10-CM | POA: Insufficient documentation

## 2020-01-23 DIAGNOSIS — S0001XA Abrasion of scalp, initial encounter: Secondary | ICD-10-CM | POA: Diagnosis not present

## 2020-01-23 DIAGNOSIS — W19XXXA Unspecified fall, initial encounter: Secondary | ICD-10-CM

## 2020-01-23 DIAGNOSIS — E039 Hypothyroidism, unspecified: Secondary | ICD-10-CM | POA: Insufficient documentation

## 2020-01-23 DIAGNOSIS — I129 Hypertensive chronic kidney disease with stage 1 through stage 4 chronic kidney disease, or unspecified chronic kidney disease: Secondary | ICD-10-CM | POA: Insufficient documentation

## 2020-01-23 DIAGNOSIS — I5031 Acute diastolic (congestive) heart failure: Secondary | ICD-10-CM | POA: Insufficient documentation

## 2020-01-23 DIAGNOSIS — Z043 Encounter for examination and observation following other accident: Secondary | ICD-10-CM | POA: Diagnosis not present

## 2020-01-23 DIAGNOSIS — S50312A Abrasion of left elbow, initial encounter: Secondary | ICD-10-CM | POA: Diagnosis not present

## 2020-01-23 DIAGNOSIS — S8002XA Contusion of left knee, initial encounter: Secondary | ICD-10-CM | POA: Diagnosis not present

## 2020-01-23 DIAGNOSIS — Z23 Encounter for immunization: Secondary | ICD-10-CM | POA: Insufficient documentation

## 2020-01-23 DIAGNOSIS — W1830XA Fall on same level, unspecified, initial encounter: Secondary | ICD-10-CM | POA: Diagnosis not present

## 2020-01-23 DIAGNOSIS — S0000XA Unspecified superficial injury of scalp, initial encounter: Secondary | ICD-10-CM | POA: Diagnosis present

## 2020-01-23 MED ORDER — TETANUS-DIPHTH-ACELL PERTUSSIS 5-2.5-18.5 LF-MCG/0.5 IM SUSY
0.5000 mL | PREFILLED_SYRINGE | Freq: Once | INTRAMUSCULAR | Status: AC
  Administered 2020-01-23: 0.5 mL via INTRAMUSCULAR
  Filled 2020-01-23: qty 0.5

## 2020-01-23 NOTE — Progress Notes (Signed)
Orthopedic Tech Progress Note Patient Details:  JURNEE NAKAYAMA Oct 19, 1940 [id number] Level 2 trauma Patient ID: Alona Bene, female   DOB: [birth year], 79 y.o.   MRN: [id number]   Janit Pagan 01/23/2020, 2:18 PM

## 2020-01-23 NOTE — ED Notes (Signed)
Got patient into a gown on the monitor patient is resting with call bell in reach and family at bedside ?

## 2020-01-23 NOTE — ED Provider Notes (Signed)
[facility] EMERGENCY DEPARTMENT Provider Note   CSN: [id number] Arrival date & time: 01/23/20  1354     History Chief Complaint  Patient presents with  . Fall    Kristin Hood is a 79 y.o. female.  HPI 79 year old female presents with fall.  She was going out to talk to determine axman and tripped when her foot did not get up and off.  She struck her left head, left elbow and left knee.  Did not lose consciousness but she did feel transiently dizzy after the fall.  She is on Eliquis.  She is having knee pain but was able to ambulate.  Right now her pain is mild.  No headache or neck pain.   Past Medical History:  Diagnosis Date  . Acute congestive heart failure ([place])   . Acute diastolic CHF (congestive heart failure) ([place]) 12/30/2016  . Acute respiratory failure with hypoxemia ([place]) 06/09/2016  . Acute respiratory failure with hypoxia ([place]) 12/30/2016  . Anginal pain ([place])   . ARF (acute renal failure) ([place]) 06/11/2016  . Arthritis   . Atrial fibrillation ([place]) 07/2012   Eliquis, rate controlled  . Benign hypertensive heart disease without heart failure 12/25/2012  . Breast cancer ([place]) 2001   left mastectomy  . Chest pain 12/11/2012  . CHF (congestive heart failure) ([place]) 06/10/2016  . Chronic atrial fibrillation ([place]) 11/16/2014  . Chronic diastolic heart failure ([place]) 02/13/2014  . Coarse tremors    , Essential  . Complication of anesthesia    SMALLER TUBE FOR INTUBATION AS GAGS ON TUBE  . COPD (chronic obstructive pulmonary disease) ([place])   . COPD exacerbation ([place]) 12/30/2016  . CVA (cerebral vascular accident) ([place]) 2013  . Dysrhythmia 08/20/2012   Atrial Fibrillation  . Essential and other specified forms of tremor 06/18/2012  . Essential hypertension 06/18/2012  . Family history of adverse reaction to anesthesia    mother experienced pain with intubation   . GERD (gastroesophageal reflux disease)    history of   . Hard of hearing   . Heart  murmur   . History of chemotherapy 11/13/1998 to 2010   Adriamycin/Docotaxol, Dexorubicin/Taxotere, Femara, Tamoxifen  . History of CVA (cerebrovascular accident) 12/11/2012  . History of hiatal hernia    history of   . Hx of adenomatous colonic polyps 2006   , Benign  . Hypercholesteremia   . Hyperlipidemia 02/13/2014  . Hypertension    , With mild concentric left ventricular hypertrophy  . Long term current use of anticoagulant therapy 12/25/2012  . Mixed dyslipidemia   . Mixed hyperlipidemia 12/25/2012  . Morbid obesity ([place]) 06/18/2012  . Phlebitis and thrombophlebitis of superficial vessels of lower extremities 12/25/2012  . 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 Pure hypercholesterolemia 12/25/2012  . Shingles   . Shortness of breath dyspnea    on exertion   . SOB (shortness of breath) 11/16/2014  . Spider veins   . Stroke ([place]) 11/17/2011   left side brain-speech-TPA  . Thyroid nodule   . Tremor, essential   . Unspecified cerebral artery occlusion with cerebral infarction 11/18/2011  . Valvular sclerosis 09/23/2003   without stenosis  . Varicose veins     Patient Active Problem List   Diagnosis Date Noted  . 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 Chronic respiratory failure with hypoxia ([place]) 11/29/2019  . Pleural effusion on right 08/25/2019  . Nocturnal hypoxemia 05/10/2019  . COPD mixed type ([place]) 03/23/2019  . GERD (gastroesophageal reflux disease) 10/24/2018  .  Hypothyroidism 10/24/2018  . Acute pyelonephritis 10/24/2018  . Sepsis ([place]) 10/24/2018  . CKD (chronic kidney disease), stage III ([place]) 10/24/2018  . 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 Trochanteric bursitis of right hip 07/06/2017  . Spinal stenosis of lumbar region 06/15/2017  . Allergic rhinitis 12/09/2016  . ARF (acute renal failure) ([place]) 06/11/2016  . Chronic diastolic CHF (congestive heart failure) ([place]) 06/10/2016  . Pulmonary nodules   . Hoarseness 06/04/2015  . Postsurgical  hypothyroidism 05/02/2015  . 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 Abnormal chest CT 12/07/2014  . Chronic atrial fibrillation ([place]) 11/16/2014  . Shortness of breath 11/16/2014  . Hypertension 11/16/2014  . OA (osteoarthritis) of hip 10/11/2014  . Lymphadenopathy 07/17/2014  . Hyperlipidemia 02/13/2014  . Chronic diastolic heart failure ([place]) 02/13/2014  . Impaired fasting glucose 12/25/2012  . Mixed hyperlipidemia 12/25/2012  . Pure hypercholesterolemia 12/25/2012  . Benign hypertensive heart disease without heart failure 12/25/2012  . Phlebitis and thrombophlebitis of superficial vessels of lower extremities 12/25/2012  . Abnormal involuntary movements(781.0) 12/25/2012  . Long term current use of anticoagulant therapy 12/25/2012  . Chest pain 12/11/2012  . Atrial fibrillation ([place]) 12/11/2012  . History of CVA (cerebrovascular accident) 12/11/2012  . Morbid obesity ([place]) 06/18/2012  . Other and unspecified hyperlipidemia 06/18/2012  . Essential hypertension 06/18/2012  . Essential and other specified forms of tremor 06/18/2012  . Stroke ([place]) 11/19/2011  . Unspecified cerebral artery occlusion with cerebral infarction 11/18/2011  . Morbid obesity with BMI of 45.0-49.9, adult ([place]) 11/18/2011    Past Surgical History:  Procedure Laterality Date  . APPENDECTOMY  07/10/1957  . BIOPSY THYROID  12/01/2013   ultrasound  . BREAST SURGERY    . BUNIONECTOMY  11/29/1988   bilateral with hammer toes  . CARDIAC CATHETERIZATION  07/2010   , Without significant CAD  . CARDIAC CATHETERIZATION  07/2011   [name]  . CESAREAN SECTION    . CESAREAN SECTION  05/1968, 07/1965  . EYE SURGERY  01/31/2013   eye lid; cataract surgery bilat   . JOINT REPLACEMENT  05/2001   left knee  . JOINT REPLACEMENT  11/2001   right knee  . JOINT REPLACEMENT  01/2008   redo right knee  . KNEE SURGERY  2009   ,Total knee replacement  . LEFT HEART CATHETERIZATION WITH  CORONARY ANGIOGRAM N/A 08/14/2011   Procedure: LEFT HEART CATHETERIZATION WITH CORONARY ANGIOGRAM;  Surgeon: [name], MD;  Location: [facility] CATH LAB;  Service: Cardiovascular;  Laterality: N/A;  . Left Rotator Cuff    . MASTECTOMY Left 10/19/1998   Radical, left -with lymph nodes (8 total)  . Reverse bunionectomy  08/29/1993   removal of Tibial Sesamoid-left  . TEE WITHOUT CARDIOVERSION  11/19/2011   Procedure: TRANSESOPHAGEAL ECHOCARDIOGRAM (TEE);  Surgeon: [name], MD;  Location: [facility] ENDOSCOPY;  Service: Cardiovascular;  Laterality: N/A;  . THORACENTESIS N/A 11/11/2019   Procedure: Mathews Robinsons;  Surgeon: Rigoberto Noel, MD;  Location: [facility];  Service: Cardiopulmonary;  Laterality: N/A;  . THYROIDECTOMY N/A 03/02/2015   Procedure: TOTAL THYROIDECTOMY;  Surgeon: Armandina Gemma, MD;  Location: [facility] ORS;  Service: General;  Laterality: N/A;  . TONSILLECTOMY    . TOTAL HIP ARTHROPLASTY Left 10/11/2014   Procedure: LEFT TOTAL HIP ARTHROPLASTY ANTERIOR APPROACH;  Surgeon: [name], MD;  Location: [facility] ORS;  Service: Orthopedics;  Laterality: Left;     OB History   No obstetric history on file.     Family History  Problem Relation Age of Onset  . Heart disease Mother   . Aortic stenosis Mother   . Heart disease Father   . Heart failure Father   . Heart attack Father     Social History   Tobacco Use  . Smoking status: Former Smoker    Packs/day: 2.00    Years: 30.00    Pack years: 60.00    Types: Cigarettes    Quit date: 01/30/1992    Years since quitting: 28.0  . Smokeless tobacco: Never Used  Vaping Use  . Vaping Use: Never used  Substance Use Topics  . Alcohol use: Yes    Alcohol/week: 1.0 standard drink    Types: 1 Standard drinks or equivalent per week    Comment: wine occassionally  . Drug use: No    Home Medications Prior to Admission medications   Medication Sig Start Date End Date Taking? Authorizing Provider  albuterol (VENTOLIN HFA) 108 (90 Base)  MCG/ACT inhaler Inhale 2 puffs into the lungs every 4 (four) hours as needed for wheezing or shortness of breath. 06/17/19   Collene Gobble, MD  amoxicillin-clavulanate (AUGMENTIN) 875-125 MG tablet Take 1 tablet by mouth 2 (two) times daily. 10/26/19   Lauraine Rinne, NP  atorvastatin (LIPITOR) 20 MG tablet Take 20 mg by mouth at bedtime.     [provider]  budesonide (PULMICORT) 0.25 MG/2ML nebulizer solution Take 0.25 mg by nebulization 2 (two) times daily.    [provider]  diphenhydrAMINE (BENADRYL) 25 MG tablet Take 25 mg by mouth daily as needed for allergies.    [provider]  doxycycline (VIBRA-TABS) 100 MG tablet Take 1 tablet (100 mg total) by mouth 2 (two) times daily. 09/08/19   Collene Gobble, MD  ELIQUIS 5 MG TABS tablet TAKE 1 TABLET BY MOUTH TWICE DAILY 10/19/19   Jerline Pain, MD  fenofibrate 160 MG tablet Take 160 mg by mouth every evening.     [provider]  fluticasone (FLONASE) 50 MCG/ACT nasal spray Place 2 sprays into both nostrils daily as needed for allergies or rhinitis.     [provider]  furosemide (LASIX) 40 MG tablet Take 1 tablet (40 mg total) by mouth 2 (two) times daily. 12/23/19   Jerline Pain, MD  guaiFENesin (MUCINEX) 600 MG 12 hr tablet Take 600 mg by mouth 2 (two) times daily as needed (congestion).     [provider]  ketoconazole (NIZORAL) 2 % cream Apply 1 application topically daily as needed (vaginal fungus).     [provider]  levothyroxine (SYNTHROID) 175 MCG tablet TAKE 1 TABLET BY MOUTH EVERY DAYBEFORE BREAKFAST 11/15/19   Philemon Kingdom, MD  Multiple Vitamin (MULTIVITAMIN WITH MINERALS) TABS tablet Take 1 tablet by mouth daily.     [provider]  potassium chloride (KLOR-CON) 10 MEQ tablet Take 1 tablet (10 mEq total) by mouth daily. 10/13/19   Jerline Pain, MD  propranolol ER (INDERAL LA) 60 MG 24 hr capsule TAKE 2 CAPSULES IN THE MORNING & TAKE 1 CAPSULE IN THE  AFTERNOON 10/13/19   Jerline Pain, MD  Tiotropium Bromide-Olodaterol (STIOLTO RESPIMAT) 2.5-2.5 MCG/ACT AERS Inhale 2 puffs into the lungs daily. 03/31/19   Lauraine Rinne, NP    Allergies    Floxin [ofloxacin]  Review of Systems   Review of Systems  Musculoskeletal: Positive for arthralgias. Negative for neck pain.  Skin: Positive for wound.  Neurological: Negative for headaches.  All other  systems reviewed and are negative.   Physical Exam Updated Vital Signs BP 99/87   Pulse 77   Temp 98 F (36.7 C) (Oral)   Resp (!) 25   Ht 5\' 5"  (1.651 m)   Wt 80.3 kg   SpO2 98%   BMI 29.45 kg/m   Physical Exam Vitals and nursing note reviewed.  Constitutional:      Appearance: She is well-developed.  HENT:     Head: Normocephalic.      Right Ear: External ear normal.     Left Ear: External ear normal.     Nose: Nose normal.  Eyes:     General:        Right eye: No discharge.        Left eye: No discharge.  Cardiovascular:     Rate and Rhythm: Normal rate and regular rhythm.     Pulses:          Radial pulses are 2+ on the left side.     Heart sounds: Murmur heard.   Pulmonary:     Effort: Pulmonary effort is normal.     Breath sounds: Normal breath sounds.  Abdominal:     Palpations: Abdomen is soft.     Tenderness: There is no abdominal tenderness.  Musculoskeletal:     Left elbow: No swelling or deformity. Normal range of motion. No tenderness.       Arms:     Cervical back: No spinous process tenderness or muscular tenderness.     Left knee: Ecchymosis present. No deformity. Normal range of motion. Tenderness present.  Skin:    General: Skin is warm and dry.  Neurological:     Mental Status: She is alert.  Psychiatric:        Mood and Affect: Mood is not anxious.     ED Results / Procedures / Treatments   Labs (all labs ordered are listed, but only abnormal results are displayed) Labs Reviewed - No data to display  EKG None  Radiology DG Elbow  Complete Left  Result Date: 01/23/2020 CLINICAL DATA:  Fall EXAM: LEFT ELBOW - COMPLETE 3+ VIEW COMPARISON:  None. FINDINGS: There is a small joint effusion. Soft tissue thickening is seen superficial to the olecranon with overlying bandaging material may reflect a combination of abrasion and contusive changes. Questionable transcortical lucency seen along the anterior aspect of the distal humerus. Fragmentation near the coronoid process demonstrating some fairly corticated margins favoring a remote injury. Additional cortical angulation at the radial head neck junction without clear cortical step-off discontinuity through may reflect sequela of prior injury as well. Background of mild degenerative/enthesopathic changes upon the medial and lateral epicondyles. IMPRESSION: 1. Soft tissue thickening superficial to the olecranon with overlying bandaging material may reflect a combination of abrasion and contusive changes. 2. Questionable lucency along the anterior aspect of the distal humerus could reflect acute supracondylar fracture line though this fracture type is quite uncommon in adults. Consider assessment for point tenderness and further evaluation with cross-sectional imaging if clinically ambiguous. 3. Age indeterminate fragmentation near the coronoid and cortical angulation at the radial head-neck junction, could reflect sequela of more remote injury. Electronically Signed   By: Lovena Le M.D.   On: 01/23/2020 15:09   CT Head Wo Contrast  Result Date: 01/23/2020 CLINICAL DATA:  Head trauma, minor. Additional history provided: Fall, hitting head. Hematoma to left side of head, patient on blood thinners. EXAM: CT HEAD WITHOUT CONTRAST TECHNIQUE: Contiguous axial images were  obtained from the base of the skull through the vertex without intravenous contrast. COMPARISON:  Prior head CT 09/14/2019. FINDINGS: Brain: Moderate cerebral atrophy. There is no acute intracranial hemorrhage. No demarcated  cortical infarct. No extra-axial fluid collection. No evidence of intracranial mass. No midline shift. Vascular: No hyperdense vessel.  Atherosclerotic calcifications. Skull: Normal. Negative for fracture or focal lesion. Sinuses/Orbits: Visualized orbits show no acute finding. No significant paranasal sinus disease or mastoid effusion at the imaged levels. Other: Left temporal scalp hematoma. IMPRESSION: No evidence of acute intracranial abnormality. Left temporal scalp hematoma. Moderate cerebral atrophy, stable as compared to the prior head CT of 09/14/2019. Electronically Signed   By: Kellie Simmering DO   On: 01/23/2020 14:59   DG Knee Complete 4 Views Left  Result Date: 01/23/2020 CLINICAL DATA:  Fall EXAM: LEFT KNEE - COMPLETE 4+ VIEW COMPARISON:  None. FINDINGS: Postsurgical changes from prior total knee arthroplasty with posterior patellar resurfacing. Expected postsurgical changes of the periprosthetic fracture or complication. Suspect a small joint effusion. Soft tissue thickening anteriorly may reflect some contusive changes. IMPRESSION: 1. Postsurgical changes from prior total knee arthroplasty without evidence of acute hardware complication or periprosthetic fracture. 2. Soft tissue thickening anteriorly may reflect contusive change. 3. Suspect small effusion. Electronically Signed   By: Lovena Le M.D.   On: 01/23/2020 15:11    Procedures Procedures (including critical care time)  Medications Ordered in ED Medications  Tdap (BOOSTRIX) injection 0.5 mL (0.5 mLs Intramuscular Given 01/23/20 1508)    ED Course  I have reviewed the triage vital signs and the nursing notes.  Pertinent labs & imaging results that were available during my care of the patient were reviewed by me and considered in my medical decision making (see chart for details).    MDM Rules/Calculators/A&P                          Patient presents with trip and fall while on eliquis. Unsure of last tetanus  immunization so she was updated here.  She got CT head, x-rays of elbow and knee.  These are fairly unremarkable.  These have been personally reviewed.  There is a questionable lucency on the distal humerus but on repeated examination of this area she has no tenderness.  My suspicion of fracture is pretty low and so she will be discharged.  Otherwise, return precautions given and she appears stable for discharge home. Final Clinical Impression(s) / ED Diagnoses Final diagnoses:  Fall, initial encounter  Abrasion of scalp, initial encounter  Abrasion of left elbow, initial encounter  Contusion of left knee, initial encounter    Rx / DC Orders ED Discharge Orders    None       Sherwood Gambler, MD 01/23/20 1552

## 2020-01-23 NOTE — ED Notes (Signed)
Patient transported to X-ray 

## 2020-01-23 NOTE — ED Triage Notes (Signed)
Pt arrives from home via [facility] after having a fall as she walked outside onto her porch, She is on eliqus and has hematoma to left side of head, denies any LOC. Swelling and pain to left knee. Pt is alert and ox4.

## 2020-01-23 NOTE — ED Notes (Signed)
Pt has 2 skin tears on left elbow. Cleaned elbow with soap and water and applied dry dressing. Pt has 2+ swelling of left knee and it's ecchymotic. Pt has a hematoma on left side of head the about the size of a golf ball and it's ecchymotic. I gave pt ice bag for head.

## 2020-01-23 NOTE — Discharge Instructions (Signed)
If you develop new or worsening headache, vomiting, neck pain or stiffness, elbow pain or arm pain, or any other new/concerning symptoms then return to the ER for evaluation.

## 2020-01-26 DIAGNOSIS — E782 Mixed hyperlipidemia: Secondary | ICD-10-CM | POA: Diagnosis not present

## 2020-01-26 DIAGNOSIS — N183 Chronic kidney disease, stage 3 unspecified: Secondary | ICD-10-CM | POA: Diagnosis not present

## 2020-01-26 DIAGNOSIS — I272 Pulmonary hypertension, unspecified: Secondary | ICD-10-CM | POA: Diagnosis not present

## 2020-01-26 DIAGNOSIS — K219 Gastro-esophageal reflux disease without esophagitis: Secondary | ICD-10-CM | POA: Diagnosis not present

## 2020-01-26 DIAGNOSIS — J449 Chronic obstructive pulmonary disease, unspecified: Secondary | ICD-10-CM | POA: Diagnosis not present

## 2020-01-26 DIAGNOSIS — I10 Essential (primary) hypertension: Secondary | ICD-10-CM | POA: Diagnosis not present

## 2020-01-26 DIAGNOSIS — I4891 Unspecified atrial fibrillation: Secondary | ICD-10-CM | POA: Diagnosis not present

## 2020-01-26 DIAGNOSIS — Z853 Personal history of malignant neoplasm of breast: Secondary | ICD-10-CM | POA: Diagnosis not present

## 2020-01-26 DIAGNOSIS — E890 Postprocedural hypothyroidism: Secondary | ICD-10-CM | POA: Diagnosis not present

## 2020-01-26 DIAGNOSIS — I5032 Chronic diastolic (congestive) heart failure: Secondary | ICD-10-CM | POA: Diagnosis not present

## 2020-01-26 DIAGNOSIS — M19041 Primary osteoarthritis, right hand: Secondary | ICD-10-CM | POA: Diagnosis not present

## 2020-01-27 ENCOUNTER — Telehealth: Payer: Self-pay | Admitting: Internal Medicine

## 2020-01-27 ENCOUNTER — Other Ambulatory Visit: Payer: Self-pay

## 2020-01-27 MED ORDER — LEVOTHYROXINE SODIUM 175 MCG PO TABS: ORAL_TABLET | ORAL | 3 refills | Status: DC

## 2020-01-27 NOTE — Telephone Encounter (Signed)
RX sent to pharmacy  

## 2020-01-27 NOTE — Telephone Encounter (Signed)
Patient needs a refill of levothyroxin 175 mcg #90 day supply sent to [facility].

## 2020-01-29 ENCOUNTER — Other Ambulatory Visit: Payer: Self-pay | Admitting: Internal Medicine

## 2020-01-30 ENCOUNTER — Telehealth: Payer: Self-pay | Admitting: Cardiology

## 2020-01-30 NOTE — Telephone Encounter (Signed)
Spoke with pt who has had wt gain as noted above.  Pt reports falling and being taken to ED by EMS.  She reports having her knee/elbow and head xrayed and CT.  Nothing was broken.  She reports bruising down her left side.  Her right foot and leg swelling are back to normal after taking extra Lasix Friday,Sat and Sunday.  She denies any SOB and reports this morning she really noticed the edema esp in her right leg as much better.  She will return to her normal dosing of Lasix (40 mg BID) and c/b if further changes before her appt on Thursday with Dr [name].

## 2020-01-30 NOTE — Telephone Encounter (Signed)
Pt c/o swelling: STAT is pt has developed SOB within 24 hours  1) How much weight have you gained and in what time span? 179 on 10/21 and 185 today 11/01  2) If swelling, where is the swelling located? Ankles and legs. States left leg is more swollen and is bruised because she fell last Monday.   3) Are you currently taking a fluid pill? Yes, went up to 3 lasix over the weekend.   4) Are you currently SOB? no  5) Do you have a log of your daily weights (if so, list)? no  6) Have you gained 3 pounds in a day or 5 pounds in a week? No        7) Have you traveled recently? no

## 2020-01-30 NOTE — Telephone Encounter (Signed)
Attempted to contact pt via phone - NA X multiple rings and no VM.  Will attempt to contact pt again.

## 2020-02-02 ENCOUNTER — Other Ambulatory Visit: Payer: Self-pay

## 2020-02-02 ENCOUNTER — Ambulatory Visit: Payer: PPO | Admitting: Cardiology

## 2020-02-02 ENCOUNTER — Encounter: Payer: Self-pay | Admitting: Cardiology

## 2020-02-02 VITALS — BP 110/60 | HR 76 | Ht 65.0 in | Wt 190.0 lb

## 2020-02-02 DIAGNOSIS — R601 Generalized edema: Secondary | ICD-10-CM | POA: Diagnosis not present

## 2020-02-02 DIAGNOSIS — R5381 Other malaise: Secondary | ICD-10-CM | POA: Diagnosis not present

## 2020-02-02 DIAGNOSIS — I5032 Chronic diastolic (congestive) heart failure: Secondary | ICD-10-CM | POA: Diagnosis not present

## 2020-02-02 MED ORDER — FUROSEMIDE 80 MG PO TABS
80.0000 mg | ORAL_TABLET | Freq: Two times a day (BID) | ORAL | 3 refills | Status: DC
Start: 2020-02-02 — End: 2020-02-15

## 2020-02-02 MED ORDER — METOLAZONE 5 MG PO TABS
5.0000 mg | ORAL_TABLET | Freq: Once | ORAL | 0 refills | Status: AC
Start: 2020-02-02 — End: 2020-02-02

## 2020-02-02 NOTE — Progress Notes (Signed)
Cardiology Office Note:    Date:  02/02/2020   ID:  Kristin Hood, DOB Dec 13, 1940, MRN [id number]  PCP:  [name], MD  [facility] Cardiologist:  [name], MD  [facility] Electrophysiologist:  None   Referring MD: [name], MD     History of Present Illness:    Kristin Hood is a 79 y.o. female here for the follow-up of leg edema.  Last office visit was 01/19/2020.  On October 19 over the telephone, advised him to increase Lasix to 80 mg twice a day.  Lower extremity edema has been fairly persistent.  Home health agency has dressed her legs.  Dr. 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  She has had recurrent thoracenteses for right pleural effusion.  I continued her Lasix 80 mg twice a day.  Weight was down to 178.  At last check.  She unfortunately had a fall and struck her left head left elbow and left knee no loss of consciousness.  She is on Eliquis.  Was evaluated in the emergency room on 01/23/2020.  In talking with her, I do not think that she was taking the 80 mg twice a day of Lasix for very long.  She admits to this.  She is now up another 10 pounds.  Very weak.  Difficult for her to stand.  Short of breath at baseline.  On home oxygen.  Past Medical History:  Diagnosis Date  . Acute congestive heart failure ([place])   . Acute diastolic CHF (congestive heart failure) ([place]) 12/30/2016  . Acute respiratory failure with hypoxemia ([place]) 06/09/2016  . Acute respiratory failure with hypoxia ([place]) 12/30/2016  . Anginal pain ([place])   . ARF (acute renal failure) ([place]) 06/11/2016  . Arthritis   . Atrial fibrillation ([place]) 07/2012   Eliquis, rate controlled  . Benign hypertensive heart disease without heart failure 12/25/2012  . Breast cancer ([place]) 2001   left mastectomy  . Chest pain 12/11/2012  . CHF (congestive heart failure) ([place]) 06/10/2016  . Chronic atrial fibrillation ([place]) 11/16/2014   . Chronic diastolic heart failure ([place]) 02/13/2014  . Coarse tremors    , Essential  . Complication of anesthesia    SMALLER TUBE FOR INTUBATION AS GAGS ON TUBE  . COPD (chronic obstructive pulmonary disease) ([place])   . COPD exacerbation ([place]) 12/30/2016  . CVA (cerebral vascular accident) ([place]) 2013  . Dysrhythmia 08/20/2012   Atrial Fibrillation  . Essential and other specified forms of tremor 06/18/2012  . Essential hypertension 06/18/2012  . Family history of adverse reaction to anesthesia    mother experienced pain with intubation   . GERD (gastroesophageal reflux disease)    history of   . Hard of hearing   . Heart murmur   . History of chemotherapy 11/13/1998 to 2010   Adriamycin/Docotaxol, Dexorubicin/Taxotere, Femara, Tamoxifen  . History of CVA (cerebrovascular accident) 12/11/2012  . History of hiatal hernia    history of   . Hx of adenomatous colonic polyps 2006   , Benign  . Hypercholesteremia   . Hyperlipidemia 02/13/2014  . Hypertension    , With mild concentric left ventricular hypertrophy  . Long term current use of anticoagulant therapy 12/25/2012  . Mixed dyslipidemia   . Mixed hyperlipidemia 12/25/2012  . Morbid obesity ([place]) 06/18/2012  . Phlebitis and thrombophlebitis of superficial vessels of lower extremities 12/25/2012  . 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 Pure hypercholesterolemia 12/25/2012  .  Shingles   . Shortness of breath dyspnea    on exertion   . SOB (shortness of breath) 11/16/2014  . Spider veins   . Stroke ([place]) 11/17/2011   left side brain-speech-TPA  . Thyroid nodule   . Tremor, essential   . Unspecified cerebral artery occlusion with cerebral infarction 11/18/2011  . Valvular sclerosis 09/23/2003   without stenosis  . Varicose veins     Past Surgical History:  Procedure Laterality Date  . APPENDECTOMY  07/10/1957  . BIOPSY THYROID  12/01/2013   ultrasound  . BREAST SURGERY    . BUNIONECTOMY  11/29/1988   bilateral with hammer  toes  . CARDIAC CATHETERIZATION  07/2010   , Without significant CAD  . CARDIAC CATHETERIZATION  07/2011   [name]  . CESAREAN SECTION    . CESAREAN SECTION  05/1968, 07/1965  . EYE SURGERY  01/31/2013   eye lid; cataract surgery bilat   . JOINT REPLACEMENT  05/2001   left knee  . JOINT REPLACEMENT  11/2001   right knee  . JOINT REPLACEMENT  01/2008   redo right knee  . KNEE SURGERY  2009   ,Total knee replacement  . LEFT HEART CATHETERIZATION WITH CORONARY ANGIOGRAM N/A 08/14/2011   Procedure: LEFT HEART CATHETERIZATION WITH CORONARY ANGIOGRAM;  Surgeon: [name], MD;  Location: [facility] CATH LAB;  Service: Cardiovascular;  Laterality: N/A;  . Left Rotator Cuff    . MASTECTOMY Left 10/19/1998   Radical, left -with lymph nodes (8 total)  . Reverse bunionectomy  08/29/1993   removal of Tibial Sesamoid-left  . TEE WITHOUT CARDIOVERSION  11/19/2011   Procedure: TRANSESOPHAGEAL ECHOCARDIOGRAM (TEE);  Surgeon: [name], MD;  Location: [facility] ENDOSCOPY;  Service: Cardiovascular;  Laterality: N/A;  . THORACENTESIS N/A 11/11/2019   Procedure: Mathews Robinsons;  Surgeon: Rigoberto Noel, MD;  Location: [place];  Service: Cardiopulmonary;  Laterality: N/A;  . THYROIDECTOMY N/A 03/02/2015   Procedure: TOTAL THYROIDECTOMY;  Surgeon: Armandina Gemma, MD;  Location: [facility] ORS;  Service: General;  Laterality: N/A;  . TONSILLECTOMY    . TOTAL HIP ARTHROPLASTY Left 10/11/2014   Procedure: LEFT TOTAL HIP ARTHROPLASTY ANTERIOR APPROACH;  Surgeon: [name], MD;  Location: [facility] ORS;  Service: Orthopedics;  Laterality: Left;    Current Medications: Current Meds  Medication Sig  . albuterol (VENTOLIN HFA) 108 (90 Base) MCG/ACT inhaler Inhale 2 puffs into the lungs every 4 (four) hours as needed for wheezing or shortness of breath.  Marland Kitchen amoxicillin-clavulanate (AUGMENTIN) 875-125 MG tablet Take 1 tablet by mouth 2 (two) times daily.  Marland Kitchen atorvastatin (LIPITOR) 20 MG tablet Take 20 mg by mouth at bedtime.   .  budesonide (PULMICORT) 0.25 MG/2ML nebulizer solution Take 0.25 mg by nebulization 2 (two) times daily.  . diphenhydrAMINE (BENADRYL) 25 MG tablet Take 25 mg by mouth daily as needed for allergies.  Marland Kitchen doxycycline (VIBRA-TABS) 100 MG tablet Take 1 tablet (100 mg total) by mouth 2 (two) times daily.  Marland Kitchen ELIQUIS 5 MG TABS tablet TAKE 1 TABLET BY MOUTH TWICE DAILY  . fenofibrate 160 MG tablet Take 160 mg by mouth every evening.   . fluticasone (FLONASE) 50 MCG/ACT nasal spray Place 2 sprays into both nostrils daily as needed for allergies or rhinitis.   . furosemide (LASIX) 40 MG tablet Take 1 tablet (40 mg total) by mouth 2 (two) times daily.  Marland Kitchen guaiFENesin (MUCINEX) 600 MG 12 hr tablet Take 600 mg by mouth 2 (two) times daily as needed (congestion).   Marland Kitchen  ketoconazole (NIZORAL) 2 % cream Apply 1 application topically daily as needed (vaginal fungus).   Marland Kitchen levothyroxine (SYNTHROID) 175 MCG tablet TAKE ONE TABLET BY MOUTH ONCE DAILY BEFORE BREAKFAST  . Multiple Vitamin (MULTIVITAMIN WITH MINERALS) TABS tablet Take 1 tablet by mouth daily.   . potassium chloride (KLOR-CON) 10 MEQ tablet Take 1 tablet (10 mEq total) by mouth daily.  . propranolol ER (INDERAL LA) 60 MG 24 hr capsule TAKE 2 CAPSULES IN THE MORNING & TAKE 1 CAPSULE IN THE AFTERNOON  . Tiotropium Bromide-Olodaterol (STIOLTO RESPIMAT) 2.5-2.5 MCG/ACT AERS Inhale 2 puffs into the lungs daily.     Allergies:   Floxin [ofloxacin]   Social History   Socioeconomic History  . Marital status: Widowed    Spouse name: Not on file  . Number of children: Not on file  . Years of education: Not on file  . Highest education level: Not on file  Occupational History  . Occupation: retired  Tobacco Use  . Smoking status: Former Smoker    Packs/day: 2.00    Years: 30.00    Pack years: 60.00    Types: Cigarettes    Quit date: 01/30/1992    Years since quitting: 28.0  . Smokeless tobacco: Never Used  Vaping Use  . Vaping Use: Never used    Substance and Sexual Activity  . Alcohol use: Yes    Alcohol/week: 1.0 standard drink    Types: 1 Standard drinks or equivalent per week    Comment: wine occassionally  . Drug use: No  . Sexual activity: Not on file  Other Topics Concern  . Not on file  Social History Narrative  . Not on file   Social Determinants of Health   Financial Resource Strain:   . Difficulty of Paying Living Expenses: Not on file  Food Insecurity:   . Worried About Charity fundraiser in the Last Year: Not on file  . Ran Out of Food in the Last Year: Not on file  Transportation Needs:   . Lack of Transportation (Medical): Not on file  . Lack of Transportation (Non-Medical): Not on file  Physical Activity:   . Days of Exercise per Week: Not on file  . Minutes of Exercise per Session: Not on file  Stress:   . Feeling of Stress : Not on file  Social Connections:   . Frequency of Communication with Friends and Family: Not on file  . Frequency of Social Gatherings with Friends and Family: Not on file  . Attends Religious Services: Not on file  . Active Member of Clubs or Organizations: Not on file  . Attends Archivist Meetings: Not on file  . Marital Status: Not on file     Family History: The patient's family history includes Aortic stenosis in her mother; Heart attack in her father; Heart disease in her father and mother; Heart failure in her father.  ROS:   Please see the history of present illness.     All other systems reviewed and are negative.    Recent Labs: 10/04/2019: Hemoglobin 10.7; Platelets 270; TSH 1.370 10/25/2019: ALT 10; NT-Pro BNP 4,134 01/19/2020: BUN 39; Creatinine, Ser 1.03; Potassium 4.3; Sodium 140  Recent Lipid Panel    Component Value Date/Time   CHOL 148 11/18/2011 0420   TRIG 101 11/18/2011 0420   HDL 34 (L) 11/18/2011 0420   CHOLHDL 4.4 11/18/2011 0420   VLDL 20 11/18/2011 0420   LDLCALC 94 11/18/2011 0420   Physical Exam:  VS:  BP 110/60    Pulse 76   Ht 5\' 5"  (1.651 m)   Wt 190 lb (86.2 kg)   SpO2 93%   BMI 31.62 kg/m     Wt Readings from Last 3 Encounters:  02/02/20 190 lb (86.2 kg)  01/23/20 177 lb (80.3 kg)  01/19/20 178 lb (80.7 kg)     GEN: Wheelchair, elderly, home oxygen, frail HEENT: Normal NECK: No JVD; No carotid bruits LYMPHATICS: No lymphadenopathy CARDIAC: RRR, no murmurs, rubs, gallops RESPIRATORY: Decreased breath sounds at bases especially right ABDOMEN: Soft, non-tender, non-distended MUSCULOSKELETAL: Severe lower extremity edema; No deformity  SKIN: Warm and dry NEUROLOGIC:  Alert and oriented x 3 PSYCHIATRIC:  Normal affect   ASSESSMENT:    1. Anasarca   2. Debility    PLAN:    In order of problems listed above:  Severe lower extremity edema, anasarca, severe exacerbation -We will make sure that she is taking Lasix 80 mg twice a day.  I will also give her 1 dose of 5 mg metolazone.  I will give her a few additional pills to be taken at our direction. -In 1 month, we will see her back in clinic, keep an eye on renal function, potassium, sodium.  Last potassium 4.3 creatinine 1.03.  Her daughter asked if the additional Lasix will hurt the kidneys.  Clearly she does have excessive fluid on board.  We may see an increase in creatinine however as long is not extreme, this will need to be tolerated in order to maintain her fluid balance.   Debility -Having trouble standing.  Weakness.  Progressive.  Stage IV lymphoma -Dr. Lew Dawes note reviewed.  Overall thought is that this will be fairly chronic but eventually terminal.  Goals of care -I think we are at a point where she should have palliative care come to her house to discuss further goals of care with her and her family.  This may help her with the resources she needs.  We will go ahead and order for her.  We also discussed the possibility of hospitalization for fluid removal.  At this time, we will continue with home care to the best of  our abilities.   Shared Decision Making/Informed Consent      Medication Adjustments/Labs and Tests Ordered: Current medicines are reviewed at length with the patient today.  Concerns regarding medicines are outlined above.  No orders of the defined types were placed in this encounter.  No orders of the defined types were placed in this encounter.   There are no Patient Instructions on file for this visit.   Signed, [name], MD  02/02/2020 2:53 PM    [place]

## 2020-02-02 NOTE — Patient Instructions (Signed)
Medication Instructions:  Please take Metolazone 5 mg once (in AM) Increase Furosemide to 80 mg twice a day. Continue all other medications as listed.  *If you need a refill on your cardiac medications before your next appointment, please call your pharmacy*  You have been referred for Palliative Care and will be contacted about scheduling a time for them to come meet you at your home.  Follow-Up: At [facility], you and your health needs are our priority.  As part of our continuing mission to provide you with exceptional heart care, we have created designated Provider Care Teams.  These Care Teams include your primary Cardiologist (physician) and Advanced Practice Providers (APPs -  Physician Assistants and Nurse Practitioners) who all work together to provide you with the care you need, when you need it.  We recommend signing up for the patient portal called "MyChart".  Sign up information is provided on this After Visit Summary.  MyChart is used to connect with patients for Virtual Visits (Telemedicine).  Patients are able to view lab/test results, encounter notes, upcoming appointments, etc.  Non-urgent messages can be sent to your provider as well.   To learn more about what you can do with MyChart, go to [email or web address].    Your next appointment:   4 week(s)  The format for your next appointment:   In Person  Provider:   [name], MD   Thank you for choosing [facility]!!

## 2020-02-09 ENCOUNTER — Telehealth: Payer: Self-pay

## 2020-02-09 NOTE — Telephone Encounter (Signed)
Spoke with patient's son Jacora Hopkins. He states patient is receiving therapy and Palliative care with [facility]. The therapist was at the home when I spoke with patient's son. Son requested to cancel the palliative referral. He reported that patient's cardiologist [name] was not aware patient was already receiving palliative care through [facility]. LNW

## 2020-02-10 ENCOUNTER — Telehealth: Payer: Self-pay

## 2020-02-10 DIAGNOSIS — E890 Postprocedural hypothyroidism: Secondary | ICD-10-CM | POA: Diagnosis not present

## 2020-02-10 DIAGNOSIS — I5032 Chronic diastolic (congestive) heart failure: Secondary | ICD-10-CM | POA: Diagnosis not present

## 2020-02-10 DIAGNOSIS — I4891 Unspecified atrial fibrillation: Secondary | ICD-10-CM | POA: Diagnosis not present

## 2020-02-10 DIAGNOSIS — G4700 Insomnia, unspecified: Secondary | ICD-10-CM | POA: Diagnosis not present

## 2020-02-10 DIAGNOSIS — M19041 Primary osteoarthritis, right hand: Secondary | ICD-10-CM | POA: Diagnosis not present

## 2020-02-10 DIAGNOSIS — Z853 Personal history of malignant neoplasm of breast: Secondary | ICD-10-CM | POA: Diagnosis not present

## 2020-02-10 DIAGNOSIS — I10 Essential (primary) hypertension: Secondary | ICD-10-CM | POA: Diagnosis not present

## 2020-02-10 DIAGNOSIS — J449 Chronic obstructive pulmonary disease, unspecified: Secondary | ICD-10-CM | POA: Diagnosis not present

## 2020-02-10 DIAGNOSIS — N183 Chronic kidney disease, stage 3 unspecified: Secondary | ICD-10-CM | POA: Diagnosis not present

## 2020-02-10 DIAGNOSIS — K219 Gastro-esophageal reflux disease without esophagitis: Secondary | ICD-10-CM | POA: Diagnosis not present

## 2020-02-10 DIAGNOSIS — E782 Mixed hyperlipidemia: Secondary | ICD-10-CM | POA: Diagnosis not present

## 2020-02-10 DIAGNOSIS — J441 Chronic obstructive pulmonary disease with (acute) exacerbation: Secondary | ICD-10-CM | POA: Diagnosis not present

## 2020-02-10 NOTE — Telephone Encounter (Signed)
Opened in error

## 2020-02-15 ENCOUNTER — Telehealth: Payer: Self-pay | Admitting: *Deleted

## 2020-02-15 MED ORDER — FUROSEMIDE 80 MG PO TABS
160.0000 mg | ORAL_TABLET | Freq: Two times a day (BID) | ORAL | 3 refills | Status: AC
Start: 2020-02-15 — End: ?

## 2020-02-15 NOTE — Telephone Encounter (Signed)
Jerline Pain, MD  [place] D 4 hours ago (6:36 AM)   Thank you for the update. I think it is fine to give another dosage of metolazone 5 mg. Also, lets increase the Lasix from 80 mg twice a day to 160 mg twice a day. Hopefully this will help her feel better.  [name], MD   Stephani Police, RN routed conversation to You; Jerline Pain, MD 22 hours ago (12:39 PM)  Andres Ege, MD 22 hours ago (12:38 PM)   Thank you Jeannene Patella.  After speaking with Palliative and Hospice personnel, I believe we are going to move forward with enrolling her in Hospice care.  This will allow her to get more assistance which is becoming increasingly necessary.  The increased Lasix is not help her to drop the fluid like it has in the past.  I do have one question until we can get everything transitioned.  Can she go ahead and take the another dosage of Metolazone 5mg ?  She took on the Saturday after her visit to [name] and it didn't really seem to help.  Was just wandering if we could try it again?   Thank you for all you do!  [name]

## 2020-02-27 ENCOUNTER — Encounter: Payer: Self-pay | Admitting: Internal Medicine

## 2020-02-29 DEATH — deceased

## 2020-03-02 ENCOUNTER — Ambulatory Visit: Payer: PPO | Admitting: Cardiology

## 2020-07-30 IMAGING — CT CT CHEST W/ CM
2 of 5 series · 13 of 36 positions shown, 16 images · IV contrast (APPLIED)
Comparison: CT CAP 07/14/2017

CLINICAL DATA: Patient with history of lymphoma. History of left
breast cancer. Re-evaluation.

EXAM:
CT CHEST, ABDOMEN, AND PELVIS WITH CONTRAST
TECHNIQUE: Multidetector CT imaging of the chest, abdomen and pelvis was
performed following the standard protocol during bolus
administration of intravenous contrast.
CONTRAST:  80mL OMNIPAQUE IOHEXOL 300 MG/ML  SOLN

[Series 2: cap with · axial · 0.88mm/px · z∈[-531,-21]mm · 10 of 126 slices shown, 13 images]
[im 12/126  mediastinal]
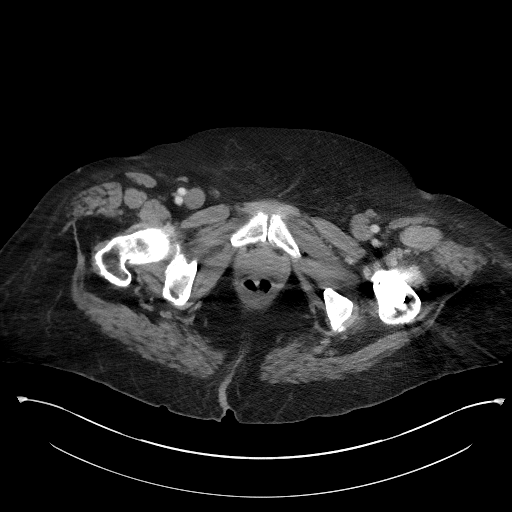
[im 12/126  lung]
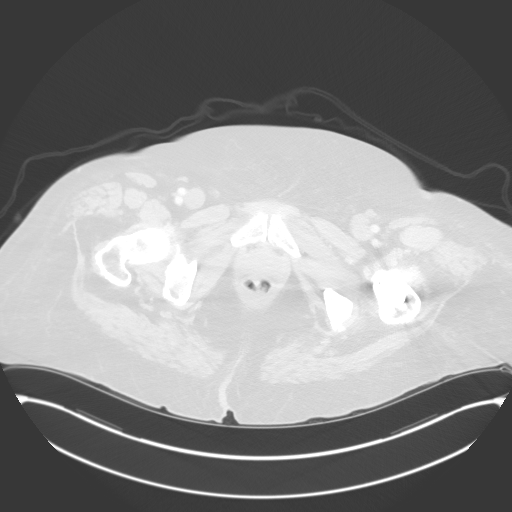
[im 23/126  lung]
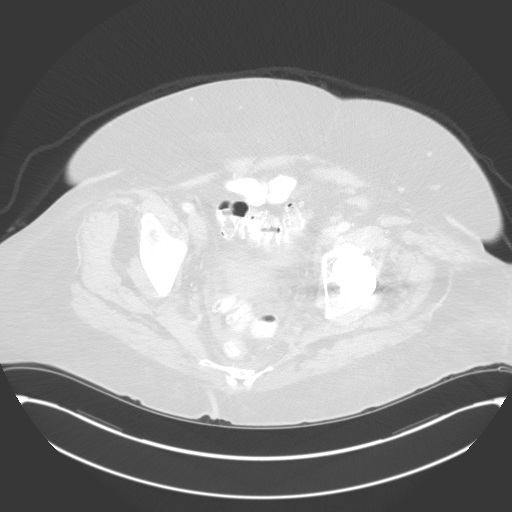
[im 35/126  lung]
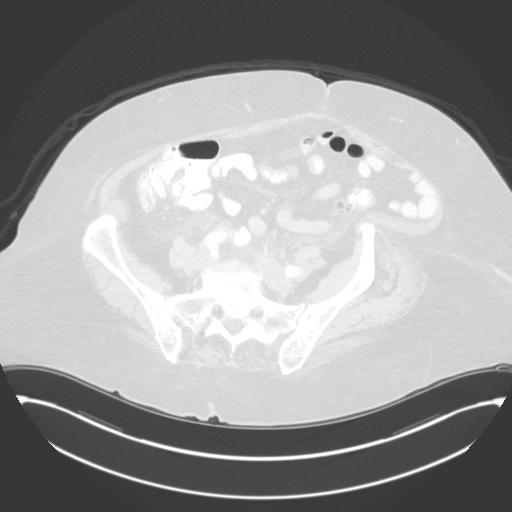
[im 46/126  lung]
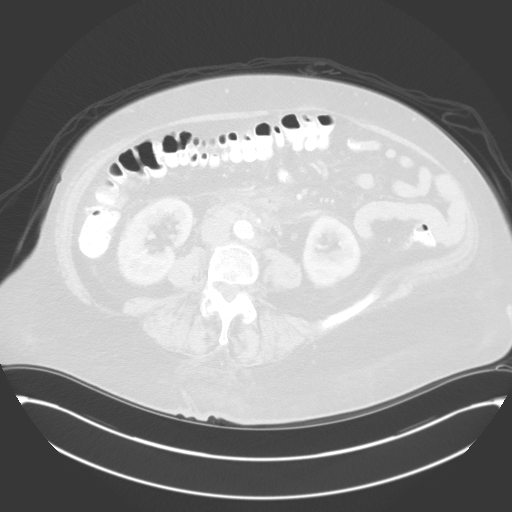
[im 57/126  mediastinal]
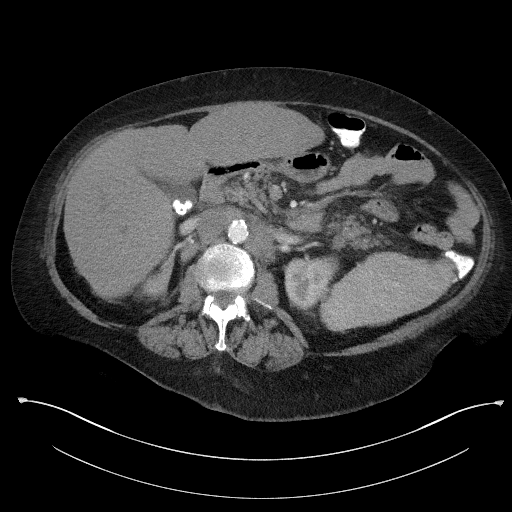
[im 57/126  lung]
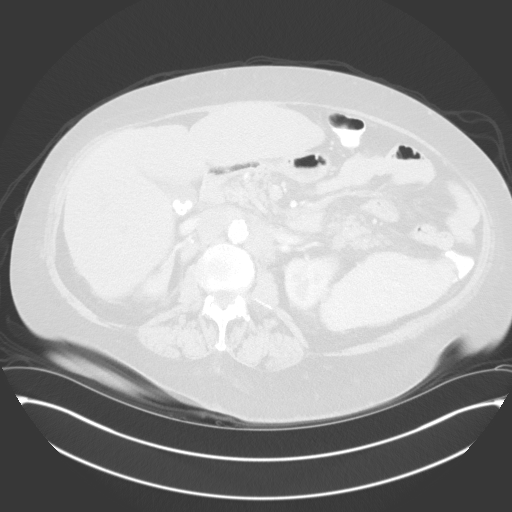
[im 69/126  lung]
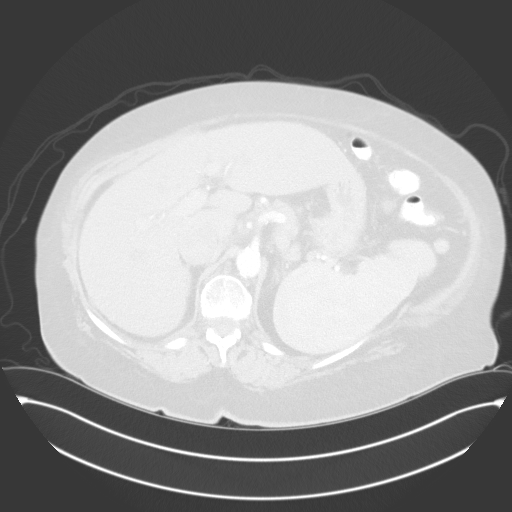
[im 80/126  lung]
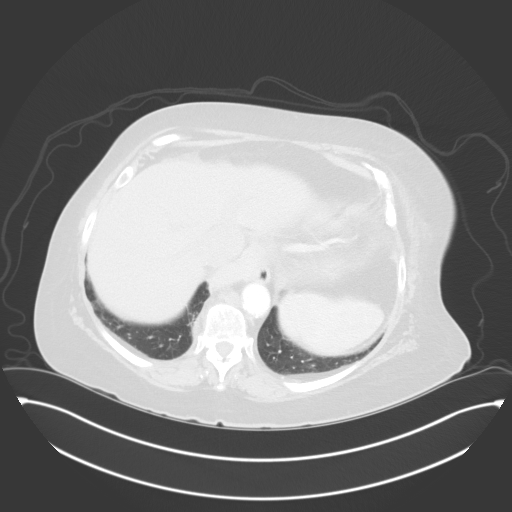
[im 91/126  lung]
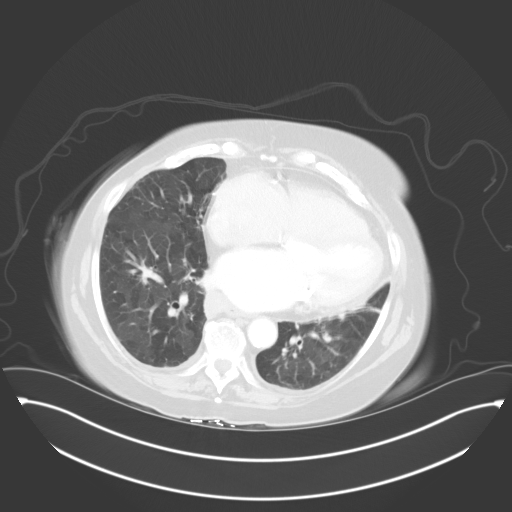
[im 103/126  mediastinal]
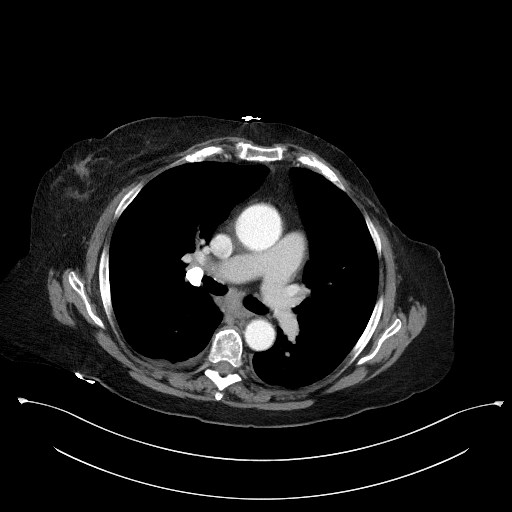
[im 103/126  lung]
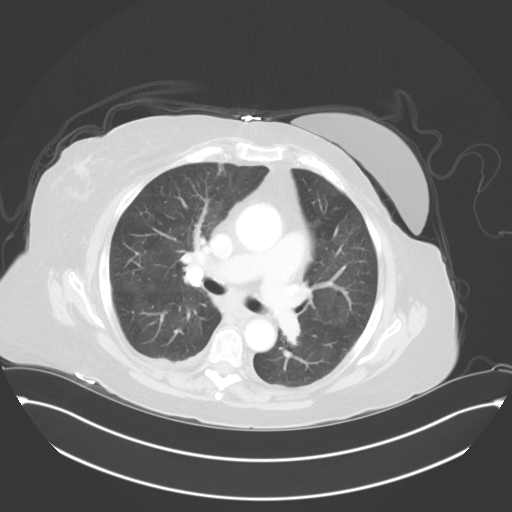
[im 114/126  lung]
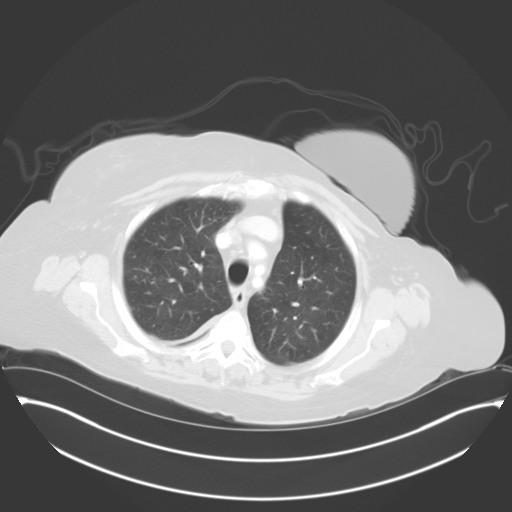

[Series 5: coronals · coronal · 0.89mm/px · 3 of 143 slices shown]
[im 29/143  lung]
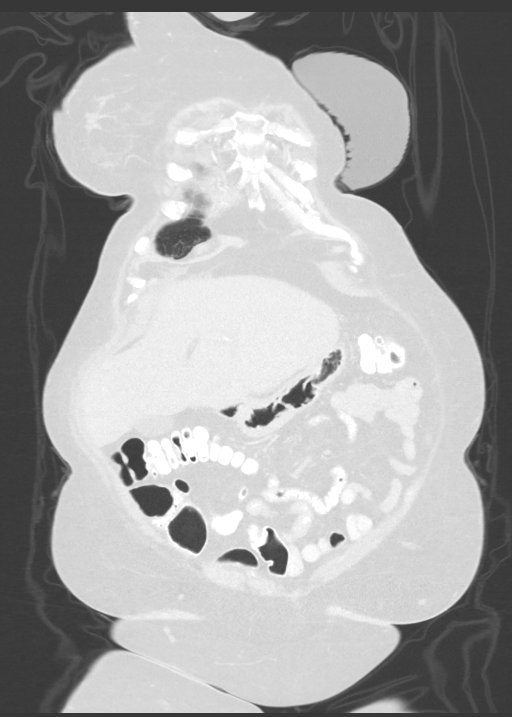
[im 57/143  lung]
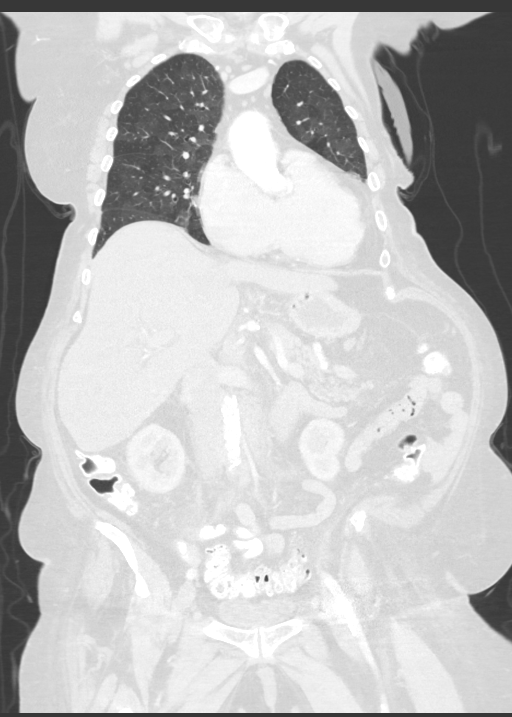
[im 86/143  lung]
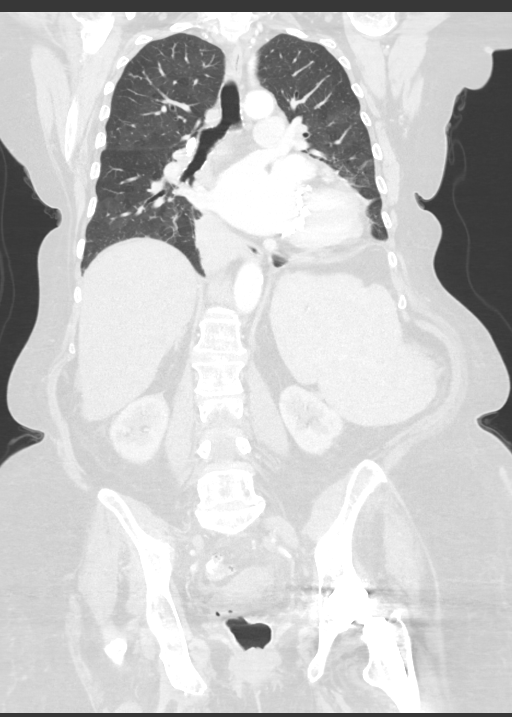

[13 of 36 positions shown; findings below may reference images not displayed]

FINDINGS: CT CHEST FINDINGS

Cardiovascular: Heart is enlarged. Coronary arterial vascular
calcifications. Thoracic aortic vascular calcifications.

Mediastinum/Nodes: Unchanged 9 mm lymph node at the thoracic inlet
(image 4; series 2). Unchanged 1.2 cm paratracheal lymph node (image
19; series 2). Similar-appearing right paraesophageal lymph node
measuring up to 3 mm in thickness (image 42; series 2). Unchanged
0.6 cm prevascular lymph node (image 18; series 2). Increased right
subpectoral lymph node measuring 9 mm (image 58; series 5),
previously 6 mm.

Lungs/Pleura: Central airways are patent. Similar-appearing patchy
ground-glass pulmonary opacities. Unchanged 3 mm nodule in the
lingula (image 64; series 4). Tiny right pleural effusion. No
pneumothorax.

Musculoskeletal: Thoracic spine degenerative changes. No aggressive
or acute appearing osseous lesions. Prior left mastectomy.

CT ABDOMEN PELVIS FINDINGS

Hepatobiliary: Mild nodularity of the hepatic contour. No focal
hepatic lesion identified. Cholelithiasis. No intrahepatic or
extrahepatic biliary ductal dilatation.

Pancreas: Unremarkable

Spleen: Increased in size measuring 17.7 cm (image 60; series 2).

Adrenals/Urinary Tract: Normal adrenal glands. Kidneys enhance
symmetrically with contrast. No hydronephrosis. Urinary bladder is
unremarkable.

Stomach/Bowel: Oral contrast material to the rectum. Sigmoid colonic
diverticulosis. No CT evidence for acute diverticulitis. No evidence
for small bowel obstruction. No free intraperitoneal air. Small
hiatal hernia. Normal morphology of the stomach.

Vascular/Lymphatic: Normal caliber abdominal aorta. Peripheral
calcified atherosclerotic plaque. Interval increase in left
periaortic adenopathy measuring up to 3.4 cm (image 73; series 2),
previously 2.4 cm. Similar-appearing 1.6 cm porta hepatic lymph node
(image 66; series 2). Interval increase in thickness of aortocaval
adenopathy measuring 2.5 cm (image 75; series 2), previously 2.0 cm.

Reproductive: Calcified fibroids.

Other: Interval development of small amount of fluid within the
abdomen and pelvis.

Musculoskeletal: Left hip arthroplasty. Lumbar spine degenerative
changes. No aggressive or acute appearing osseous lesions.
IMPRESSION: Slight interval increase in size of retroperitoneal adenopathy. Mild
interval increase in size of right subpectoral lymph node.

Interval increase in size of splenomegaly.

Interval development of small amount of fluid within the
retroperitoneum and pelvis, nonspecific in etiology.

Scattered ground-glass opacities within the lungs which may
represent chronic lung disease such as hypersensitivity pneumonitis.

## 2020-08-21 ENCOUNTER — Other Ambulatory Visit: Payer: PPO

## 2020-08-23 ENCOUNTER — Ambulatory Visit: Payer: PPO | Admitting: Internal Medicine

## 2020-11-20 ENCOUNTER — Ambulatory Visit: Payer: PPO | Admitting: Internal Medicine

## 2022-12-02 NOTE — Patient Instructions (Signed)
No visit-error.

## 2022-12-02 NOTE — Progress Notes (Signed)
This encounter was created in error - please disregard.
